# Patient Record
Sex: Male | Born: 1945
Health system: Southern US, Community
[De-identification: ages and names within clinical notes are randomized; demographics above are authoritative.]

## PROBLEM LIST (undated history)

## (undated) DIAGNOSIS — E039 Hypothyroidism, unspecified: Secondary | ICD-10-CM

## (undated) DIAGNOSIS — I517 Cardiomegaly: Secondary | ICD-10-CM

## (undated) DIAGNOSIS — K7460 Unspecified cirrhosis of liver: Secondary | ICD-10-CM

## (undated) DIAGNOSIS — I861 Scrotal varices: Secondary | ICD-10-CM

## (undated) DIAGNOSIS — D696 Thrombocytopenia, unspecified: Secondary | ICD-10-CM

## (undated) DIAGNOSIS — B159 Hepatitis A without hepatic coma: Secondary | ICD-10-CM

## (undated) DIAGNOSIS — M1990 Unspecified osteoarthritis, unspecified site: Secondary | ICD-10-CM

## (undated) DIAGNOSIS — I219 Acute myocardial infarction, unspecified: Secondary | ICD-10-CM

## (undated) DIAGNOSIS — T7840XA Allergy, unspecified, initial encounter: Secondary | ICD-10-CM

## (undated) DIAGNOSIS — K824 Cholesterolosis of gallbladder: Secondary | ICD-10-CM

## (undated) DIAGNOSIS — N529 Male erectile dysfunction, unspecified: Secondary | ICD-10-CM

## (undated) DIAGNOSIS — E785 Hyperlipidemia, unspecified: Secondary | ICD-10-CM

## (undated) DIAGNOSIS — H269 Unspecified cataract: Secondary | ICD-10-CM

## (undated) DIAGNOSIS — D369 Benign neoplasm, unspecified site: Secondary | ICD-10-CM

## (undated) HISTORY — DX: Unspecified cirrhosis of liver: K74.60

## (undated) HISTORY — DX: Unspecified osteoarthritis, unspecified site: M19.90

## (undated) HISTORY — PX: POLYPECTOMY: SHX149

## (undated) HISTORY — DX: Unspecified cataract: H26.9

## (undated) HISTORY — PX: LIVER BIOPSY: SHX301

## (undated) HISTORY — DX: Hyperlipidemia, unspecified: E78.5

## (undated) HISTORY — DX: Allergy, unspecified, initial encounter: T78.40XA

## (undated) HISTORY — DX: Acute myocardial infarction, unspecified: I21.9

## (undated) HISTORY — DX: Benign neoplasm, unspecified site: D36.9

## (undated) HISTORY — DX: Hepatitis a without hepatic coma: B15.9

## (undated) HISTORY — DX: Male erectile dysfunction, unspecified: N52.9

## (undated) HISTORY — PX: TONSILLECTOMY: SUR1361

## (undated) HISTORY — DX: Hypothyroidism, unspecified: E03.9

## (undated) HISTORY — DX: Scrotal varices: I86.1

---
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
## 2011-02-01 DIAGNOSIS — I861 Scrotal varices: Secondary | ICD-10-CM

## 2011-02-01 HISTORY — DX: Scrotal varices: I86.1

## 2011-02-05 DIAGNOSIS — E039 Hypothyroidism, unspecified: Secondary | ICD-10-CM | POA: Insufficient documentation

## 2011-02-05 DIAGNOSIS — D126 Benign neoplasm of colon, unspecified: Secondary | ICD-10-CM | POA: Insufficient documentation

## 2011-02-05 DIAGNOSIS — E782 Mixed hyperlipidemia: Secondary | ICD-10-CM | POA: Insufficient documentation

## 2012-02-09 DIAGNOSIS — N529 Male erectile dysfunction, unspecified: Secondary | ICD-10-CM | POA: Insufficient documentation

## 2012-08-09 DIAGNOSIS — D72819 Decreased white blood cell count, unspecified: Secondary | ICD-10-CM | POA: Insufficient documentation

## 2012-12-06 HISTORY — PX: COLONOSCOPY: SHX174

## 2016-12-30 LAB — HM HEPATITIS C SCREENING LAB: HM HEPATITIS C SCREENING: NEGATIVE

## 2017-01-07 DIAGNOSIS — M19042 Primary osteoarthritis, left hand: Secondary | ICD-10-CM | POA: Insufficient documentation

## 2017-01-07 DIAGNOSIS — M65342 Trigger finger, left ring finger: Secondary | ICD-10-CM | POA: Insufficient documentation

## 2017-11-08 ENCOUNTER — Encounter: Payer: Self-pay | Admitting: *Deleted

## 2017-11-08 ENCOUNTER — Other Ambulatory Visit: Payer: Self-pay | Admitting: *Deleted

## 2017-11-08 ENCOUNTER — Encounter: Payer: Self-pay | Admitting: Family Medicine

## 2017-11-08 ENCOUNTER — Ambulatory Visit (INDEPENDENT_AMBULATORY_CARE_PROVIDER_SITE_OTHER): Payer: Medicare Other | Admitting: Family Medicine

## 2017-11-08 VITALS — BP 120/84 | HR 62 | Temp 97.9°F | Ht 73.0 in | Wt 255.2 lb

## 2017-11-08 DIAGNOSIS — Z79899 Other long term (current) drug therapy: Secondary | ICD-10-CM

## 2017-11-08 DIAGNOSIS — N529 Male erectile dysfunction, unspecified: Secondary | ICD-10-CM

## 2017-11-08 DIAGNOSIS — D126 Benign neoplasm of colon, unspecified: Secondary | ICD-10-CM | POA: Diagnosis not present

## 2017-11-08 DIAGNOSIS — E782 Mixed hyperlipidemia: Secondary | ICD-10-CM | POA: Diagnosis not present

## 2017-11-08 DIAGNOSIS — E039 Hypothyroidism, unspecified: Secondary | ICD-10-CM | POA: Diagnosis not present

## 2017-11-08 LAB — COMPREHENSIVE METABOLIC PANEL
ALK PHOS: 68 U/L (ref 39–117)
ALT: 27 U/L (ref 0–53)
AST: 24 U/L (ref 0–37)
Albumin: 4.4 g/dL (ref 3.5–5.2)
BUN: 11 mg/dL (ref 6–23)
CO2: 28 meq/L (ref 19–32)
CREATININE: 0.88 mg/dL (ref 0.40–1.50)
Calcium: 9.8 mg/dL (ref 8.4–10.5)
Chloride: 103 mEq/L (ref 96–112)
GFR: 90.41 mL/min (ref >60.00)
GLUCOSE: 107 mg/dL — AB (ref 70–99)
Potassium: 4.3 mEq/L (ref 3.5–5.1)
SODIUM: 136 meq/L (ref 135–145)
Total Bilirubin: 0.7 mg/dL (ref 0.2–1.2)
Total Protein: 6.9 g/dL (ref 6.0–8.3)

## 2017-11-08 LAB — LIPID PANEL
CHOLESTEROL: 171 mg/dL (ref 0–200)
HDL: 44.1 mg/dL (ref >39.00)
LDL Cholesterol: 102 mg/dL — ABNORMAL HIGH (ref 0–99)
NonHDL: 126.95
Total CHOL/HDL Ratio: 4
Triglycerides: 124 mg/dL (ref 0.0–149.0)
VLDL: 24.8 mg/dL (ref 0.0–40.0)

## 2017-11-08 LAB — CBC WITH DIFFERENTIAL/PLATELET
BASOS ABS: 0 10*3/uL (ref 0.0–0.1)
Basophils Relative: 0.9 % (ref 0.0–3.0)
EOS ABS: 0.1 10*3/uL (ref 0.0–0.7)
Eosinophils Relative: 3.1 % (ref 0.0–5.0)
HCT: 40.1 % (ref 39.0–52.0)
Hemoglobin: 13.8 g/dL (ref 13.0–17.0)
LYMPHS ABS: 1 10*3/uL (ref 0.7–4.0)
Lymphocytes Relative: 28.1 % (ref 12.0–46.0)
MCHC: 34.3 g/dL (ref 30.0–36.0)
MCV: 97.3 fl (ref 78.0–100.0)
Monocytes Absolute: 0.4 10*3/uL (ref 0.1–1.0)
Monocytes Relative: 11.3 % (ref 3.0–12.0)
NEUTROS ABS: 2.1 10*3/uL (ref 1.4–7.7)
NEUTROS PCT: 56.6 % (ref 43.0–77.0)
PLATELETS: 152 10*3/uL (ref 150.0–400.0)
RBC: 4.13 Mil/uL — ABNORMAL LOW (ref 4.22–5.81)
RDW: 13.8 % (ref 11.5–15.5)
WBC: 3.7 10*3/uL — ABNORMAL LOW (ref 4.0–10.5)

## 2017-11-08 LAB — TSH: TSH: 0.57 u[IU]/mL (ref 0.35–4.50)

## 2017-11-08 MED ORDER — SILDENAFIL CITRATE 20 MG PO TABS: ORAL_TABLET | ORAL | 3 refills | Status: DC

## 2017-11-08 NOTE — Progress Notes (Signed)
Subjective  CC:  Chief Complaint  Patient presents with  . Follow-up  . Hypothyroidism    HPI: Timothy Grant is a 71 y.o. male who presents to [place] [facility] today to establish care with me as a new patient. Former [place] patient; last cpe and awv jan 2018 He has the following concerns or needs:   Hypothyroidism f/u: Last TSH was at goal.  He continues on his daily thyroid supplementation and feels well.  He denies symptoms of low or high thyroid.  He does not need refills.  He is compliant with his medication all the time.  History of adenomatous polyp of the colon, last colonoscopy 2015 with Dr. Collene Mares.  He believes she put him on a every 5 year surveillance schedule.  He denies melena abdominal pain or weight changes.  Erectile disorder: He has been on sildenafil for the last year or 2 and has been working very well.  He requests a refill.  He denies medication side effects.  He is in a happy stable monogamous marriage.  Hyperlipidemia follow-up: Continues on statin.  Lipids have been well controlled.  Due for LFT and lipid check.  No myalgias or adverse effects.  His diet is fair.  Exercise has been decreased over the last several months due to the colder weather.  He has gained a little bit of weight as well.  He is not routinely doing exercise.  They do eat out often.  We updated and reviewed the patient's past history in detail and it is documented below.  Problem  Acquired Hypothyroidism  Adenomatous Colon Polyp   Overview:  Adenomatous polyps, q 3-5 year screens, last 2015, Dr. Collene Mares   Mixed Hyperlipidemia  Osteoarthritis of Finger of Left Hand  Leukopenia   Overview:  mild   Trigger Finger, Left Ring Finger  Male Erectile Disorder   Health Maintenance  Topic Date Due  . Hepatitis C Screening  1946/08/10  . COLONOSCOPY  02/23/2019  . TETANUS/TDAP  02/04/2021  . INFLUENZA VACCINE  Completed  . PNA vac Low Risk Adult  Completed    Immunization History  Administered Date(s) Administered  . Hepatitis B, ped/adol 08/06/2009  . Influenza Split 08/07/2011, 08/28/2013  . Influenza, High Dose Seasonal PF 09/26/2017  . Influenza, Quadrivalent, Recombinant, Inj, Pf 09/06/2016  . Influenza, Seasonal, Injecte, Preservative Fre 09/04/2014, 09/09/2015  . Influenza,trivalent, recombinat, inj, PF 08/09/2012  . Pneumococcal Conjugate-13 11/04/2014  . Pneumococcal Polysaccharide-23 02/05/2011  . Tdap 02/05/2011  . Zoster 02/05/2011   Current Meds  Medication Sig  . aspirin EC 81 MG tablet Take 162 mg by mouth daily.  . Cholecalciferol (VITAMIN D3) 5000 units TABS Take 1 tablet by mouth daily.   . cyclobenzaprine (FLEXERIL) 10 MG tablet Take 10 mg by mouth as needed.   . Glucosamine HCl 1000 MG TABS Take 2 tablets by mouth daily.  Marland Kitchen levothyroxine (SYNTHROID, LEVOTHROID) 150 MCG tablet   . Multiple Vitamin (MULTIVITAMIN) capsule Take 1 capsule by mouth daily.   . naproxen sodium (ALEVE) 220 MG tablet Take 220 mg by mouth as needed.  Marland Kitchen omega-3 acid ethyl esters (LOVAZA) 1 g capsule Take 1 g by mouth daily.   . rosuvastatin (CRESTOR) 20 MG tablet Take 20 mg by mouth daily.   . sildenafil (REVATIO) 20 MG tablet Use 5 tablets PO as needed  . [DISCONTINUED] Glucosamine Sulfate 500 MG CAPS Take by mouth.  . [DISCONTINUED] sildenafil (REVATIO) 20 MG tablet Use 5 tablets PO as needed  Allergies: Patient  reports that he drinks alcohol. Past Medical History Patient  has a past medical history of Adenomatous polyps, ED (erectile dysfunction), Hyperlipidemia, Hypothyroidism, and Varicocele (02/01/2011). Past Surgical History Patient  has a past surgical history that includes Tonsillectomy; Eye surgery (Right, 1956); and Eye surgery (Left, 1963). Family History: Patient family history includes Alzheimer's disease in his mother; Arthritis in his mother; Dementia in his mother; Heart disease in his father and paternal grandmother;  Hyperlipidemia in his brother; Stroke in his mother. Social History:  Patient  reports that  has never smoked. he has never used smokeless tobacco. He reports that he drinks alcohol. He reports that he does not use drugs.  Review of Systems: Constitutional: negative for fever or malaise Cardiovascular: negative for chest pain Respiratory: negative for SOB or persistent cough Gastrointestinal: negative for abdominal pain Genitourinary: negative for dysuria or gross hematuria Musculoskeletal: negative for new gait disturbance or muscular weakness Integumentary: negative for new or persistent rashes  Patient Care Team    Relationship Specialty Notifications Start End  [name], MD PCP - General Family Medicine  11/07/17     Objective  Vitals: BP 120/84 (BP Location: Left Arm, Patient Position: Sitting, Cuff Size: Large)   Pulse 62   Temp 97.9 F (36.6 C) (Oral)   Ht 6\' 1"  (1.854 m)   Wt 255 lb 4 oz (115.8 kg)   SpO2 94%   BMI 33.68 kg/m  General:  Well developed, well nourished, no acute distress  Psych:  Alert and oriented,normal mood and affect HEENT:  Normocephalic, atraumatic, supple neck , no thyromegaly, oropharynx clear and moist Cardiovascular:  RRR without murmur, no gallop, no peripheral edema Respiratory:  Good breath sounds bilaterally, CTAB with normal respiratory effort Gastrointestinal: normal bowel sounds, soft, non-tender, no noted masses. No HSM Skin:  Warm, no rashes Neurologic:   Mental status is normal. Gross motor and sensory exams are normal. Normal gait, no tremor  Assessment  1. Acquired hypothyroidism   2. Mixed hyperlipidemia   3. Adenomatous polyp of colon, unspecified part of colon   4. Male erectile disorder   5. High risk medication use      Plan   Hypothyroidism follow-up: Stable clinically.  Recheck TSH.  Continue Synthroid 150 mcg daily, adjust if TSH is abnormal.  Recheck in 1 year if stable, 6 weeks if needs medication  adjustment.  Hyperlipidemia follow-up: Continue statin and recheck lab work today.  Adenomatous colon polyp: Expect surveillance every 5 years.  ED: Refilled ED medication.  Health maintenance is up-to-date.  Discussed improving diet and exercise program.  Discussed strength training which will be helpful to maintain high level of function as he ages.  See AVS for keeping healthy recommendations.  Follow up: Follow-up in 1 year for annual physical, annual wellness visit at his convenience.   Commons side effects, risks, benefits, and alternatives for medications and treatment plan prescribed today were discussed, and the patient expressed understanding of the given instructions. Patient is instructed to call or message via MyChart if he/she has any questions or concerns regarding our treatment plan. No barriers to understanding were identified. We discussed Red Flag symptoms and signs in detail. Patient expressed understanding regarding what to do in case of urgent or emergency type symptoms.   Medication list was reconciled, printed and provided to the patient in AVS. Patient instructions and summary information was reviewed with the patient as documented in the AVS. This note was prepared with assistance of Dragon  voice recognition software. Occasional wrong-word or sound-a-like substitutions may have occurred due to the inherent limitations of voice recognition software  Orders Placed This Encounter  Procedures  . CBC with Differential/Platelet  . Comprehensive metabolic panel  . TSH  . Lipid panel   Meds ordered this encounter  Medications  . sildenafil (REVATIO) 20 MG tablet    Sig: Use 5 tablets PO as needed    Dispense:  100 tablet    Refill:  3

## 2017-11-08 NOTE — Patient Instructions (Signed)
It was so good seeing you again! Thank you for establishing with my new practice and allowing me to continue caring for you. It means a lot to me.   Please schedule a follow up appointment with me in 1 year for your complete physical. You can schedule an AWV at your convenience.   Please do these things to maintain good health!   Exercise at least 30-45 minutes a day,  4-5 days a week.   Eat a low-fat diet with lots of fruits and vegetables, up to 7-9 servings per day.  Drink plenty of water daily. Try to drink 8 8oz glasses per day.  Seatbelts can save your life. Always wear your seatbelt.  Place Smoke Detectors on every level of your home and check batteries every year.  Eye Doctor - have an eye exam every 1-2 years  Safe sex - use condoms to protect yourself from STDs if you could be exposed to these types of infections.  Avoid heavy alcohol use. If you drink, keep it to less than 2 drinks/day and not every day.  [place].  Choose someone you trust that could speak for you if you became unable to speak for yourself.  Depression is common in our stressful world.If you're feeling down or losing interest in things you normally enjoy, please come in for a visit.

## 2018-01-25 ENCOUNTER — Other Ambulatory Visit: Payer: Self-pay | Admitting: Family Medicine

## 2018-01-25 MED ORDER — ROSUVASTATIN CALCIUM 20 MG PO TABS: 20.0000 mg | ORAL_TABLET | Freq: Every day | ORAL | 1 refills | Status: DC

## 2018-01-25 NOTE — Telephone Encounter (Signed)
Rx refill request- for provider review:  Crestor 20 mg- historical medication  LOV: 11/08/17- establish care  Pharmacy: verified

## 2018-01-25 NOTE — Telephone Encounter (Signed)
Patient had his establish care visit and his CPE is scheduled. Medication filled.

## 2018-01-25 NOTE — Telephone Encounter (Signed)
Copied from [place] [id number]. Topic: Quick Communication - Rx Refill/Question >> Jan 25, 2018 11:10 AM Antonieta Iba C wrote:   Medication:  rosuvastatin (CRESTOR) 20 MG tablet   Has the patient contacted their pharmacy? Yes -    (Agent: If no, request that the patient contact the pharmacy for the refill.)   Preferred Pharmacy (with phone number or street name): [facility], [place]   Agent: Please be advised that RX refills may take up to 3 business days. We ask that you follow-up with your pharmacy.

## 2018-03-21 NOTE — Progress Notes (Signed)
Subjective:   Timothy Grant is a 72 y.o. male who presents for Medicare Annual/Subsequent preventive examination.  Review of Systems:  No ROS.  Medicare Wellness Visit. Additional risk factors are reflected in the social history.  Cardiac Risk Factors include: advanced age (>69men, >69 women);dyslipidemia;male gender;family history of premature cardiovascular disease;obesity (BMI >30kg/m2)   Sleep patterns: Sleeps 6-8 hours.  Home Safety/Smoke Alarms: Feels safe in home. Smoke alarms in place.  Living environment; residence and Firearm Safety: Lives with wife in single story home. Steps with rail at door.  Seat Belt Safety/Bike Helmet: Wears seat belt.    Male:   CCS-Colonoscopy 2015, polyps. Recall 5 years, Dr. Collene Mares.  PSA- No results found for: PSA      Objective:    Vitals: BP 130/82 (BP Location: Left Arm, Patient Position: Sitting, Cuff Size: Normal)   Pulse (!) 56   Temp 97.8 F (36.6 C) (Temporal)   Resp 18   Ht 6\' 1"  (1.854 m)   Wt 254 lb (115.2 kg)   SpO2 96%   BMI 33.51 kg/m   Body mass index is 33.51 kg/m.  Advanced Directives 03/22/2018  Does Patient Have a Medical Advance Directive? Yes  Type of Advance Directive Living will;Healthcare Power of [place] in Chart? No - copy requested    Tobacco Social History   Tobacco Use  Smoking Status Never Smoker  Smokeless Tobacco Never Used     Counseling given: Not Answered    Past Medical History:  Diagnosis Date  . Adenomatous polyps   . ED (erectile dysfunction)   . Hyperlipidemia   . Hypothyroidism   . Varicocele 02/01/2011   Left testicle   Past Surgical History:  Procedure Laterality Date  . EYE SURGERY Right 1956  . EYE SURGERY Left 1963  . TONSILLECTOMY     Family History  Problem Relation Age of Onset  . Alzheimer's disease Mother   . Arthritis Mother   . Dementia Mother   . Stroke Mother   . Heart disease Father   . Cancer Sister    uterine  . Hyperlipidemia Brother   . Stroke Brother   . Cancer Brother   . Heart disease Paternal Grandmother    Social History   Socioeconomic History  . Marital status: Married    Spouse name: Not on file  . Number of children: Not on file  . Years of education: Not on file  . Highest education level: Not on file  Occupational History  . Not on file  Social Needs  . Financial resource strain: Not on file  . Food insecurity:    Worry: Not on file    Inability: Not on file  . Transportation needs:    Medical: Not on file    Non-medical: Not on file  Tobacco Use  . Smoking status: Never Smoker  . Smokeless tobacco: Never Used  Substance and Sexual Activity  . Alcohol use: Yes    Comment: socially  . Drug use: No  . Sexual activity: Yes  Lifestyle  . Physical activity:    Days per week: Not on file    Minutes per session: Not on file  . Stress: Not on file  Relationships  . Social connections:    Talks on phone: Not on file    Gets together: Not on file    Attends religious service: Not on file    Active member of club or organization: Not on file  Attends meetings of clubs or organizations: Not on file    Relationship status: Not on file  Other Topics Concern  . Not on file  Social History Narrative  . Not on file    Outpatient Encounter Medications as of 03/22/2018  Medication Sig  . aspirin EC 81 MG tablet Take 162 mg by mouth daily.  . Cholecalciferol (VITAMIN D3) 5000 units TABS Take 1 tablet by mouth daily.   . cyclobenzaprine (FLEXERIL) 10 MG tablet Take 10 mg by mouth as needed.   . Glucosamine HCl 1000 MG TABS Take 2 tablets by mouth daily.  Marland Kitchen levothyroxine (SYNTHROID, LEVOTHROID) 150 MCG tablet   . Multiple Vitamin (MULTIVITAMIN) capsule Take 1 capsule by mouth daily.   . naproxen sodium (ALEVE) 220 MG tablet Take 220 mg by mouth as needed.  Marland Kitchen omega-3 acid ethyl esters (LOVAZA) 1 g capsule Take 1 g by mouth daily.   . rosuvastatin (CRESTOR) 20 MG  tablet Take 1 tablet (20 mg total) by mouth daily.  . sildenafil (REVATIO) 20 MG tablet Use 5 tablets PO as needed  . Zoster Vaccine Adjuvanted [facility]) injection Inject 0.5 mLs into the muscle once for 1 dose.   No facility-administered encounter medications on file as of 03/22/2018.     Activities of Daily Living In your present state of health, do you have any difficulty performing the following activities: 03/22/2018  Hearing? N  Vision? N  Difficulty concentrating or making decisions? N  Walking or climbing stairs? N  Dressing or bathing? N  Doing errands, shopping? N  Preparing Food and eating ? N  Using the Toilet? N  In the past six months, have you accidently leaked urine? N  Do you have problems with loss of bowel control? N  Managing your Medications? N  Managing your Finances? N  Housekeeping or managing your Housekeeping? N  Some recent data might be hidden    Patient Care Team: [name], MD as PCP - General (Family Medicine) Stephannie Li, [place] (Ophthalmology) Daryll Brod, MD as Consulting Physician (Orthopedic Surgery) [place] (Dentistry)   Assessment:   This is a routine wellness examination for Timothy Grant.  Exercise Activities and Dietary recommendations Current Exercise Habits: Home exercise routine(mows yard), Type of exercise: walking(walks dogs), Time (Minutes): 40, Frequency (Times/Week): 7, Weekly Exercise (Minutes/Week): 280, Exercise limited by: None identified   Diet (meal preparation, eat out, water intake, caffeinated beverages, dairy products, fruits and vegetables): Drinks water, unsweet tea and diet soda.   Breakfast: coffee, toast; cereal Lunch: sandwich, salad Dinner: protein and vegetables.  Snacks: fruit  Goals    . Weight (lb) < 240 lb (108.9 kg)     Lose 15-20 lbs by increasing activity and decrease caloric intake.        Fall Risk Fall Risk  03/22/2018 11/08/2017  Falls in the past year? No No     Depression Screen PHQ  2/9 Scores 03/22/2018 11/08/2017  PHQ - 2 Score 0 0    Cognitive Function MMSE - Mini Mental State Exam 03/22/2018  Orientation to time 5  Orientation to Place 5  Registration 3  Attention/ Calculation 5  Recall 3  Language- name 2 objects 2  Language- repeat 1  Language- follow 3 step command 3  Language- read & follow direction 1  Write a sentence 1  Copy design 1  Total score 30        Immunization History  Administered Date(s) Administered  . Hepatitis B, ped/adol 08/06/2009  .  Influenza Split 08/07/2011, 08/28/2013  . Influenza, High Dose Seasonal PF 09/26/2017  . Influenza, Quadrivalent, Recombinant, Inj, Pf 09/06/2016  . Influenza, Seasonal, Injecte, Preservative Fre 09/04/2014, 09/09/2015  . Influenza,trivalent, recombinat, inj, PF 08/09/2012  . Pneumococcal Conjugate-13 11/04/2014  . Pneumococcal Polysaccharide-23 02/05/2011  . Tdap 02/05/2011  . Zoster 02/05/2011     Screening Tests Health Maintenance  Topic Date Due  . Hepatitis C Screening  November 26, 1946  . INFLUENZA VACCINE  07/06/2018  . COLONOSCOPY  02/23/2019  . TETANUS/TDAP  02/04/2021  . PNA vac Low Risk Adult  Completed        Plan:    Shingles vaccine at pharmacy.   Continue doing brain stimulating activities (puzzles, reading, adult coloring books, staying active) to keep memory sharp.   Bring a copy of your living will and/or healthcare power of attorney to your next office visit.   I have personally reviewed and noted the following in the patient's chart:   . Medical and social history . Use of alcohol, tobacco or illicit drugs  . Current medications and supplements . Functional ability and status . Nutritional status . Physical activity . Advanced directives . List of other physicians . Hospitalizations, surgeries, and ER visits in previous 12 months . Vitals . Screenings to include cognitive, depression, and falls . Referrals and appointments  In addition, I have reviewed and  discussed with patient certain preventive protocols, quality metrics, and best practice recommendations. A written personalized care plan for preventive services as well as general preventive health recommendations were provided to patient.     Gerilyn Nestle, RN  03/22/2018

## 2018-03-22 ENCOUNTER — Other Ambulatory Visit: Payer: Self-pay

## 2018-03-22 ENCOUNTER — Ambulatory Visit (INDEPENDENT_AMBULATORY_CARE_PROVIDER_SITE_OTHER): Payer: Medicare Other | Admitting: Family Medicine

## 2018-03-22 ENCOUNTER — Ambulatory Visit (INDEPENDENT_AMBULATORY_CARE_PROVIDER_SITE_OTHER): Payer: Medicare Other

## 2018-03-22 ENCOUNTER — Encounter: Payer: Self-pay | Admitting: Family Medicine

## 2018-03-22 VITALS — BP 130/82 | HR 56 | Temp 97.8°F | Resp 18 | Ht 73.0 in | Wt 254.0 lb

## 2018-03-22 DIAGNOSIS — Z0000 Encounter for general adult medical examination without abnormal findings: Secondary | ICD-10-CM | POA: Diagnosis not present

## 2018-03-22 DIAGNOSIS — D72819 Decreased white blood cell count, unspecified: Secondary | ICD-10-CM

## 2018-03-22 DIAGNOSIS — Z23 Encounter for immunization: Secondary | ICD-10-CM | POA: Diagnosis not present

## 2018-03-22 DIAGNOSIS — E782 Mixed hyperlipidemia: Secondary | ICD-10-CM

## 2018-03-22 DIAGNOSIS — E039 Hypothyroidism, unspecified: Secondary | ICD-10-CM | POA: Diagnosis not present

## 2018-03-22 DIAGNOSIS — Z1159 Encounter for screening for other viral diseases: Secondary | ICD-10-CM | POA: Diagnosis not present

## 2018-03-22 LAB — COMPREHENSIVE METABOLIC PANEL
ALK PHOS: 69 U/L (ref 39–117)
ALT: 23 U/L (ref 0–53)
AST: 23 U/L (ref 0–37)
Albumin: 4.2 g/dL (ref 3.5–5.2)
BUN: 16 mg/dL (ref 6–23)
CHLORIDE: 100 meq/L (ref 96–112)
CO2: 27 meq/L (ref 19–32)
Calcium: 9.6 mg/dL (ref 8.4–10.5)
Creatinine, Ser: 0.97 mg/dL (ref 0.40–1.50)
GFR: 80.71 mL/min (ref >60.00)
GLUCOSE: 94 mg/dL (ref 70–99)
POTASSIUM: 4.3 meq/L (ref 3.5–5.1)
SODIUM: 135 meq/L (ref 135–145)
Total Bilirubin: 0.6 mg/dL (ref 0.2–1.2)
Total Protein: 6.8 g/dL (ref 6.0–8.3)

## 2018-03-22 LAB — LIPID PANEL
CHOL/HDL RATIO: 3
Cholesterol: 154 mg/dL (ref 0–200)
HDL: 50.1 mg/dL (ref >39.00)
LDL Cholesterol: 88 mg/dL (ref 0–99)
NonHDL: 103.57
Triglycerides: 76 mg/dL (ref 0.0–149.0)
VLDL: 15.2 mg/dL (ref 0.0–40.0)

## 2018-03-22 LAB — TSH: TSH: 0.65 u[IU]/mL (ref 0.35–4.50)

## 2018-03-22 MED ORDER — ZOSTER VAC RECOMB ADJUVANTED 50 MCG/0.5ML IM SUSR
0.5000 mL | Freq: Once | INTRAMUSCULAR | 1 refills | Status: AC
Start: 2018-03-22 — End: 2018-03-22

## 2018-03-22 NOTE — Patient Instructions (Addendum)
Please return in April 2020 for your annual complete physical and your Annual Wellness Exam please come fasting.   If you have any questions or concerns, please don't hesitate to send me a message via MyChart or call the office at [phone]. Thank you for visiting with [place] today! It's our pleasure caring for you.  Please do these things to maintain good health!   Exercise at least 30-45 minutes a day,  4-5 days a week.   Eat a low-fat diet with lots of fruits and vegetables, up to 7-9 servings per day.  Drink plenty of water daily. Try to drink 8 8oz glasses per day.  Seatbelts can save your life. Always wear your seatbelt.  Place Smoke Detectors on every level of your home and check batteries every year.  Eye Doctor - have an eye exam every 1-2 years  Safe sex - use condoms to protect yourself from STDs if you could be exposed to these types of infections.  Avoid heavy alcohol use. If you drink, keep it to less than 2 drinks/day and not every day.  [place].  Choose someone you trust that could speak for you if you became unable to speak for yourself.  Depression is common in our stressful world.If you're feeling down or losing interest in things you normally enjoy, please come in for a visit.

## 2018-03-22 NOTE — Progress Notes (Signed)
Subjective  Chief Complaint  Patient presents with  . Annual Exam    AWE with Maudie Mercury.     HPI: Timothy Grant is a 72 y.o. male who presents to [place] at [facility] today for a Male Wellness Visit. Just completed AWV; I have reviewed findings. Wellness Visit: annual visit with health maintenance review and exam    Doing GREAT! No concerns. Reviewed recent labs from December - thyroid and lipids at goal on meds. No AEs. Energy is good. Mood is good. ROS negative as listed below.  HM  - due for shingrix. RX given today  Lifestyle: Body mass index is 33.51 kg/m. Wt Readings from Last 3 Encounters:  03/22/18 254 lb (115.2 kg)  03/22/18 254 lb (115.2 kg)  11/08/17 255 lb 4 oz (115.8 kg)   Diet: general Exercise: intermittently, walking  Chronic disease management visit and/or acute problem visit:  hypothryoidism and hyperlipidemia stable. ED on viagra.   Patient Active Problem List   Diagnosis Date Noted  . Acquired hypothyroidism 02/05/2011    Priority: High  . Adenomatous colon polyp 02/05/2011    Priority: High  . Mixed hyperlipidemia 02/05/2011    Priority: High  . Osteoarthritis of finger of left hand 01/07/2017    Priority: Medium  . Leukopenia 08/09/2012    Priority: Medium  . Trigger finger, left ring finger 01/07/2017    Priority: Low  . Male erectile disorder 02/09/2012    Priority: Low   Health Maintenance  Topic Date Due  . Hepatitis C Screening  13-Sep-1946  . INFLUENZA VACCINE  07/06/2018  . COLONOSCOPY  02/23/2019  . TETANUS/TDAP  02/04/2021  . PNA vac Low Risk Adult  Completed   Immunization History  Administered Date(s) Administered  . Hepatitis B, ped/adol 08/06/2009  . Influenza Split 08/07/2011, 08/28/2013  . Influenza, High Dose Seasonal PF 09/26/2017  . Influenza, Quadrivalent, Recombinant, Inj, Pf 09/06/2016  . Influenza, Seasonal, Injecte, Preservative Fre 09/04/2014, 09/09/2015  . Influenza,trivalent, recombinat,  inj, PF 08/09/2012  . Pneumococcal Conjugate-13 11/04/2014  . Pneumococcal Polysaccharide-23 02/05/2011  . Tdap 02/05/2011  . Zoster 02/05/2011   We updated and reviewed the patient's past history in detail and it is documented below. Allergies: Patient has No Known Allergies. Past Medical History  has a past medical history of Adenomatous polyps, ED (erectile dysfunction), Hyperlipidemia, Hypothyroidism, and Varicocele (02/01/2011). Past Surgical History Patient  has a past surgical history that includes Tonsillectomy; Eye surgery (Right, 1956); and Eye surgery (Left, 1963). Social History Patient  reports that he has never smoked. He has never used smokeless tobacco. He reports that he drinks alcohol. He reports that he does not use drugs. Family History family history includes Alzheimer's disease in his mother; Arthritis in his mother; Cancer in his brother and sister; Dementia in his mother; Heart disease in his father and paternal grandmother; Hyperlipidemia in his brother; Stroke in his brother and mother. Review of Systems: Constitutional: negative for fever or malaise Ophthalmic: negative for photophobia, double vision or loss of vision Cardiovascular: negative for chest pain, dyspnea on exertion, or new LE swelling Respiratory: negative for SOB or persistent cough Gastrointestinal: negative for abdominal pain, change in bowel habits or melena Genitourinary: negative for dysuria or gross hematuria Musculoskeletal: negative for new gait disturbance or muscular weakness Integumentary: negative for new or persistent rashes Neurological: negative for TIA or stroke symptoms Psychiatric: negative for SI or delusions Allergic/Immunologic: negative for hives  Patient Care Team    Relationship Specialty Notifications Start  End  [name], MD PCP - General Family Medicine  11/07/17   Stephannie Li, [place]  Ophthalmology  03/22/18   Daryll Brod, MD Consulting Physician Orthopedic  Surgery  03/22/18   [place]  Dentistry  03/22/18   Juanita Craver, MD Consulting Physician Gastroenterology  03/22/18    Objective  Vitals: BP 130/82   Pulse (!) 56   Temp 97.8 F (36.6 C)   Resp 18   Ht 6\' 1"  (1.854 m)   Wt 254 lb (115.2 kg)   SpO2 96%   BMI 33.51 kg/m  General:  Well developed, well nourished, no acute distress  Psych:  Alert and orientedx3,normal mood and affect HEENT:  Normocephalic, atraumatic, non-icteric sclera, PERRL, oropharynx is clear without mass or exudate, supple neck without adenopathy, mass or thyromegaly Cardiovascular:  Normal S1, S2, RRR without gallop, rub or murmur, nondisplaced PMI, +2 distal pulses in bilateral upper and lower extremities. Respiratory:  Good breath sounds bilaterally, CTAB with normal respiratory effort Gastrointestinal: normal bowel sounds, soft, non-tender, no noted masses. No HSM MSK: no deformities, contusions. Joints are without erythema or swelling. Spine and CVA region are nontender Skin:  Warm, no suspicious lesions noted, hypopigmented lesions torso - Possible tinea versicolor Neurologic:    Mental status is normal. CN 2-11 are normal. Gross motor and sensory exams are normal. Stable gait. No tremor GU: No inguinal hernias or adenopathy are appreciated bilaterally   Assessment  1. Annual physical exam   2. Acquired hypothyroidism   3. Mixed hyperlipidemia   4. Leukopenia, unspecified type      Plan  Male Wellness Visit:  Age appropriate Health Maintenance and Prevention measures were discussed with patient. Included topics are cancer screening recommendations, ways to keep healthy (see AVS) including dietary and exercise recommendations, regular eye and dental care, use of seat belts, and avoidance of moderate alcohol use and tobacco use.   BMI: discussed patient's BMI and encouraged positive lifestyle modifications to help get to or maintain a target BMI.  HM needs and immunizations were addressed and ordered. See  below for orders. See HM and immunization section for updates.shingrix RX  Routine labs and screening tests ordered including cmp, cbc and lipids where appropriate.  Discussed recommendations regarding Vit D and calcium supplementation (see AVS)  Chronic disease f/u and/or acute problem visit: (deemed necessary to be done in addition to the wellness visit):  Recheck lipids and tsh for stability, then annually. No med changes.   Follow up: Return in about 1 year (around 03/23/2019) for complete physical, AWV .   Commons side effects, risks, benefits, and alternatives for medications and treatment plan prescribed today were discussed, and the patient expressed understanding of the given instructions. Patient is instructed to call or message via MyChart if he/she has any questions or concerns regarding our treatment plan. No barriers to understanding were identified. We discussed Red Flag symptoms and signs in detail. Patient expressed understanding regarding what to do in case of urgent or emergency type symptoms.   Medication list was reconciled, printed and provided to the patient in AVS. Patient instructions and summary information was reviewed with the patient as documented in the AVS. This note was prepared with assistance of Dragon voice recognition software. Occasional wrong-word or sound-a-like substitutions may have occurred due to the inherent limitations of voice recognition software  Orders Placed This Encounter  Procedures  . HM HEPATITIS C SCREENING LAB  . Comprehensive metabolic panel  . TSH  . Lipid panel  No orders of the defined types were placed in this encounter.

## 2018-03-22 NOTE — Patient Instructions (Addendum)
Shingles vaccine at pharmacy  Continue doing brain stimulating activities (puzzles, reading, adult coloring books, staying active) to keep memory sharp.   Bring a copy of your living will and/or healthcare power of attorney to your next office visit.   Health Maintenance, Male A healthy lifestyle and preventive care is important for your health and wellness. Ask your health care provider about what schedule of regular examinations is right for you. What should I know about weight and diet? Eat a Healthy Diet  Eat plenty of vegetables, fruits, whole grains, low-fat dairy products, and lean protein.  Do not eat a lot of foods high in solid fats, added sugars, or salt.  Maintain a Healthy Weight Regular exercise can help you achieve or maintain a healthy weight. You should:  Do at least 150 minutes of exercise each week. The exercise should increase your heart rate and make you sweat (moderate-intensity exercise).  Do strength-training exercises at least twice a week.  Watch Your Levels of Cholesterol and Blood Lipids  Have your blood tested for lipids and cholesterol every 5 years starting at 73 years of age. If you are at high risk for heart disease, you should start having your blood tested when you are 73 years old. You may need to have your cholesterol levels checked more often if: ? Your lipid or cholesterol levels are high. ? You are older than 73 years of age. ? You are at high risk for heart disease.  What should I know about cancer screening? Many types of cancers can be detected early and may often be prevented. Lung Cancer  You should be screened every year for lung cancer if: ? You are a current smoker who has smoked for at least 30 years. ? You are a former smoker who has quit within the past 15 years.  Talk to your health care provider about your screening options, when you should start screening, and how often you should be screened.  Colorectal Cancer  Routine  colorectal cancer screening usually begins at 73 years of age and should be repeated every 5-10 years until you are 73 years old. You may need to be screened more often if early forms of precancerous polyps or small growths are found. Your health care provider may recommend screening at an earlier age if you have risk factors for colon cancer.  Your health care provider may recommend using home test kits to check for hidden blood in the stool.  A small camera at the end of a tube can be used to examine your colon (sigmoidoscopy or colonoscopy). This checks for the earliest forms of colorectal cancer.  Prostate and Testicular Cancer  Depending on your age and overall health, your health care provider may do certain tests to screen for prostate and testicular cancer.  Talk to your health care provider about any symptoms or concerns you have about testicular or prostate cancer.  Skin Cancer  Check your skin from head to toe regularly.  Tell your health care provider about any new moles or changes in moles, especially if: ? There is a change in a mole's size, shape, or color. ? You have a mole that is larger than a pencil eraser.  Always use sunscreen. Apply sunscreen liberally and repeat throughout the day.  Protect yourself by wearing long sleeves, pants, a wide-brimmed hat, and sunglasses when outside.  What should I know about heart disease, diabetes, and high blood pressure?  If you are 18-39 years of age, have   have your blood pressure checked every 3-5 years. If you are 40 years of age or older, have your blood pressure checked every year. You should have your blood pressure measured twice-once when you are at a hospital or clinic, and once when you are not at a hospital or clinic. Record the average of the two measurements. To check your blood pressure when you are not at a hospital or clinic, you can use: ? An automated blood pressure machine at a pharmacy. ? A home blood  pressure monitor.  Talk to your health care provider about your target blood pressure.  If you are between 45-79 years old, ask your health care provider if you should take aspirin to prevent heart disease.  Have regular diabetes screenings by checking your fasting blood sugar level. ? If you are at a normal weight and have a low risk for diabetes, have this test once every three years after the age of 45. ? If you are overweight and have a high risk for diabetes, consider being tested at a younger age or more often.  A one-time screening for abdominal aortic aneurysm (AAA) by ultrasound is recommended for men aged 65-75 years who are current or former smokers. What should I know about preventing infection? Hepatitis B If you have a higher risk for hepatitis B, you should be screened for this virus. Talk with your health care provider to find out if you are at risk for hepatitis B infection. Hepatitis C Blood testing is recommended for:  Everyone born from 1945 through 1965.  Anyone with known risk factors for hepatitis C.  Sexually Transmitted Diseases (STDs)  You should be screened each year for STDs including gonorrhea and chlamydia if: ? You are sexually active and are younger than 73 years of age. ? You are older than 73 years of age and your health care provider tells you that you are at risk for this type of infection. ? Your sexual activity has changed since you were last screened and you are at an increased risk for chlamydia or gonorrhea. Ask your health care provider if you are at risk.  Talk with your health care provider about whether you are at high risk of being infected with HIV. Your health care provider may recommend a prescription medicine to help prevent HIV infection.  What else can I do?  Schedule regular health, dental, and eye exams.  Stay current with your vaccines (immunizations).  Do not use any tobacco products, such as cigarettes, chewing tobacco, and  e-cigarettes. If you need help quitting, ask your health care provider.  Limit alcohol intake to no more than 2 drinks per day. One drink equals 12 ounces of beer, 5 ounces of wine, or 1 ounces of hard liquor.  Do not use street drugs.  Do not share needles.  Ask your health care provider for help if you need support or information about quitting drugs.  Tell your health care provider if you often feel depressed.  Tell your health care provider if you have ever been abused or do not feel safe at home. This information is not intended to replace advice given to you by your health care provider. Make sure you discuss any questions you have with your health care provider. Document Released: 05/20/2008 Document Revised: 07/21/2016 Document Reviewed: 08/26/2015 Elsevier Interactive Patient Education  2018 [employer]  

## 2018-04-25 ENCOUNTER — Other Ambulatory Visit: Payer: Self-pay | Admitting: *Deleted

## 2018-04-25 MED ORDER — LEVOTHYROXINE SODIUM 150 MCG PO TABS: 150.0000 ug | ORAL_TABLET | Freq: Every day | ORAL | 3 refills | Status: DC

## 2018-06-28 ENCOUNTER — Other Ambulatory Visit: Payer: Self-pay | Admitting: Family Medicine

## 2018-09-23 ENCOUNTER — Other Ambulatory Visit: Payer: Self-pay | Admitting: Family Medicine

## 2018-11-06 ENCOUNTER — Other Ambulatory Visit: Payer: Self-pay | Admitting: Family Medicine

## 2018-11-06 NOTE — Telephone Encounter (Signed)
Copied from [place] [phone]. Topic: Quick Communication - Rx Refill/Question >> Nov 06, 2018  6:58 PM Timothy Grant wrote: Medication: cyclobenzaprine (FLEXERIL) 10 MG tablet [[id number]]   Has the patient contacted their pharmacy? Yes  (Agent: If no, request that the patient contact the pharmacy the refill.) (Agent: If yes, when and what did the pharmacy advise?)  Preferred Pharmacy (with phone number or street name):  [facility] [address] [place] [address] Phone: [phone] Fax: [phone]   Agent: Please be advised that RX refills may take up to 3 business days. We ask that you follow-up with your pharmacy.

## 2018-11-06 NOTE — Telephone Encounter (Signed)
Pt requesting refill of Cyclobenzaprine previously filled by historical provider.  LOV: 03/22/18 Next OV: 03/28/19

## 2018-11-07 MED ORDER — CYCLOBENZAPRINE HCL 10 MG PO TABS: 10.0000 mg | ORAL_TABLET | ORAL | 2 refills | Status: DC | PRN

## 2018-12-18 ENCOUNTER — Other Ambulatory Visit: Payer: Self-pay

## 2018-12-18 ENCOUNTER — Ambulatory Visit (INDEPENDENT_AMBULATORY_CARE_PROVIDER_SITE_OTHER): Payer: Medicare Other | Admitting: Family Medicine

## 2018-12-18 ENCOUNTER — Encounter: Payer: Self-pay | Admitting: Family Medicine

## 2018-12-18 VITALS — BP 128/80 | HR 83 | Temp 99.1°F | Resp 16 | Ht 73.0 in | Wt 257.4 lb

## 2018-12-18 DIAGNOSIS — R5383 Other fatigue: Secondary | ICD-10-CM | POA: Diagnosis not present

## 2018-12-18 DIAGNOSIS — M791 Myalgia, unspecified site: Secondary | ICD-10-CM

## 2018-12-18 DIAGNOSIS — E039 Hypothyroidism, unspecified: Secondary | ICD-10-CM | POA: Diagnosis not present

## 2018-12-18 DIAGNOSIS — R195 Other fecal abnormalities: Secondary | ICD-10-CM

## 2018-12-18 DIAGNOSIS — R17 Unspecified jaundice: Secondary | ICD-10-CM

## 2018-12-18 LAB — POCT URINALYSIS DIPSTICK
Bilirubin, UA: 4
Blood, UA: NEGATIVE
Glucose, UA: NEGATIVE
Ketones, UA: 4
Leukocytes, UA: NEGATIVE
Nitrite, UA: NEGATIVE
Protein, UA: POSITIVE — AB
SPEC GRAV UA: 1.025 (ref 1.010–1.025)
Urobilinogen, UA: 0.2 E.U./dL
pH, UA: 6 (ref 5.0–8.0)

## 2018-12-18 LAB — CBC WITH DIFFERENTIAL/PLATELET
BASOS ABS: 0 10*3/uL (ref 0.0–0.1)
Basophils Relative: 0.5 % (ref 0.0–3.0)
EOS ABS: 0 10*3/uL (ref 0.0–0.7)
Eosinophils Relative: 1 % (ref 0.0–5.0)
HEMATOCRIT: 37.5 % — AB (ref 39.0–52.0)
HEMOGLOBIN: 12.9 g/dL — AB (ref 13.0–17.0)
LYMPHS PCT: 36.2 % (ref 12.0–46.0)
Lymphs Abs: 0.7 10*3/uL (ref 0.7–4.0)
MCHC: 34.5 g/dL (ref 30.0–36.0)
MCV: 95.3 fl (ref 78.0–100.0)
Monocytes Absolute: 0.2 10*3/uL (ref 0.1–1.0)
Monocytes Relative: 11.7 % (ref 3.0–12.0)
Neutro Abs: 1 10*3/uL — ABNORMAL LOW (ref 1.4–7.7)
Neutrophils Relative %: 50.6 % (ref 43.0–77.0)
PLATELETS: 123 10*3/uL — AB (ref 150.0–400.0)
RBC: 3.94 Mil/uL — ABNORMAL LOW (ref 4.22–5.81)
RDW: 15.2 % (ref 11.5–15.5)

## 2018-12-18 LAB — BASIC METABOLIC PANEL
BUN: 12 mg/dL (ref 6–23)
CHLORIDE: 92 meq/L — AB (ref 96–112)
CO2: 27 mEq/L (ref 19–32)
Calcium: 7.8 mg/dL — ABNORMAL LOW (ref 8.4–10.5)
Creatinine, Ser: 0.94 mg/dL (ref 0.40–1.50)
GFR: 83.52 mL/min (ref >60.00)
GLUCOSE: 112 mg/dL — AB (ref 70–99)
POTASSIUM: 4 meq/L (ref 3.5–5.1)
Sodium: 124 mEq/L — ABNORMAL LOW (ref 135–145)

## 2018-12-18 LAB — TSH: TSH: 0.28 u[IU]/mL — ABNORMAL LOW (ref 0.35–4.50)

## 2018-12-18 LAB — HEPATIC FUNCTION PANEL
ALK PHOS: 168 U/L — AB (ref 39–117)
ALT: 2544 U/L — AB (ref 0–53)
AST: 2677 U/L — AB (ref 0–37)
Albumin: 3 g/dL — ABNORMAL LOW (ref 3.5–5.2)
BILIRUBIN TOTAL: 7.1 mg/dL — AB (ref 0.2–1.2)
Bilirubin, Direct: 5 mg/dL — ABNORMAL HIGH (ref 0.0–0.3)
Total Protein: 5.7 g/dL — ABNORMAL LOW (ref 6.0–8.3)

## 2018-12-18 LAB — SEDIMENTATION RATE: SED RATE: 39 mm/h — AB (ref 0–20)

## 2018-12-18 LAB — CK: Total CK: 58 U/L (ref 7–232)

## 2018-12-18 LAB — LIPASE: Lipase: 36 U/L (ref 11.0–59.0)

## 2018-12-18 NOTE — Progress Notes (Signed)
Subjective  CC:  Chief Complaint  Patient presents with  . Lack of energy    Started last Tuesday.. Lower body aches and back tenderness.. Reports that this morning his stool was clay color and urine is Museum/gallery conservator colored    HPI: Timothy Grant is a 73 y.o. male who presents to [facility] today to establish care with me as a new patient.   He has the following concerns or needs:  Timothy Grant is here due to 1 week history of malaise, fatigue and myalgias.  He reports that symptoms started quite suddenly.  No associated fevers, upper respiratory symptoms, shortness of breath, abdominal pain, nausea vomiting or diarrhea.  His wife also reports that she noticed some yellowness to his skin over the last 24 hours.  He endorses itching, dark amber-colored urine and gray stools over the last 24 hours.  He has had a lack of appetite but no abdominal pain.  He reports he was exposed to hepatitis A about 2 months ago; a friend of theirs was staying at their house for a few days and was subsequently hospitalized for hepatitis A.  He since has recovered.  Patient denies right upper quadrant pain.  No history of acute hepatitis.  He is a rare alcohol drinker.  He does not take any supplements, rare Tylenol use.  He denies lower extremity edema or increasing abdominal girth.  He has been drinking plenty of fluids.  He denies irritative urinary symptoms.  No fevers.  Assessment  1. Jaundice   2. Other fatigue   3. Acquired hypothyroidism   4. Myalgia   5. Acholic stool      Plan   Jaundice: Differential diagnosis is broad but history and clinical exam could be consistent with an acute hepatitis.  Check lab work for LFTs, blood counts, lipase etc.  Once labs return, further imaging testing to be done if indicated.  Currently patient is stable.  We discussed red flag symptoms of blood in the stool, vomiting, abdominal pain, high fever, all of which would warrant emergency  room evaluation.  Close follow-up recommended.  Avoid alcohol and Tylenol.  See after visit summary for further instructions.  Follow up:  Return in about 1 year (around 12/19/2019) for recheck. Orders Placed This Encounter  Procedures  . Urine Culture  . CBC with Differential/Platelet  . Basic metabolic panel  . Hepatic function panel  . Sedimentation rate  . Hepatitis, Acute  . TSH  . CK (Creatine Kinase)  . Lipase  . POCT urinalysis dipstick   No orders of the defined types were placed in this encounter.    Depression screen [facility] 2/9 03/22/2018 11/08/2017  Decreased Interest 0 0  Down, Depressed, Hopeless 0 0  PHQ - 2 Score 0 0    We updated and reviewed the patient's past history in detail and it is documented below.  Patient Active Problem List   Diagnosis Date Noted  . Acquired hypothyroidism 02/05/2011    Priority: High  . Adenomatous colon polyp 02/05/2011    Priority: High    Overview:  Adenomatous polyps, q 3-5 year screens, last 2015, Dr. Collene Mares   . Mixed hyperlipidemia 02/05/2011    Priority: High  . Osteoarthritis of finger of left hand 01/07/2017    Priority: Medium  . Leukopenia 08/09/2012    Priority: Medium    Overview:  mild   . Trigger finger, left ring finger 01/07/2017    Priority: Low  .  Male erectile disorder 02/09/2012    Priority: Low   Health Maintenance  Topic Date Due  . COLONOSCOPY  02/23/2019  . TETANUS/TDAP  02/04/2021  . INFLUENZA VACCINE  Completed  . Hepatitis C Screening  Completed  . PNA vac Low Risk Adult  Completed   Immunization History  Administered Date(s) Administered  . Hepatitis B, ped/adol 08/06/2009  . Influenza Split 08/07/2011, 08/28/2013  . Influenza, High Dose Seasonal PF 09/26/2017  . Influenza, Quadrivalent, Recombinant, Inj, Pf 09/06/2016  . Influenza, Seasonal, Injecte, Preservative Fre 09/04/2014, 09/09/2015  . Influenza,trivalent, recombinat, inj, PF 08/09/2012  . Pneumococcal Conjugate-13 11/04/2014    . Pneumococcal Polysaccharide-23 02/05/2011  . Tdap 02/05/2011  . Zoster 02/05/2011   Current Meds  Medication Sig  . aspirin EC 81 MG tablet Take 162 mg by mouth daily.  . Cholecalciferol (VITAMIN D3) 5000 units TABS Take 1 tablet by mouth daily.   . cyclobenzaprine (FLEXERIL) 10 MG tablet Take 1 tablet (10 mg total) by mouth as needed.  . Glucosamine HCl 1000 MG TABS Take 2 tablets by mouth daily.  Marland Kitchen levothyroxine (SYNTHROID, LEVOTHROID) 150 MCG tablet Take 1 tablet (150 mcg total) by mouth daily before breakfast.  . Multiple Vitamin (MULTIVITAMIN) capsule Take 1 capsule by mouth daily.   . naproxen sodium (ALEVE) 220 MG tablet Take 220 mg by mouth as needed.  Marland Kitchen omega-3 acid ethyl esters (LOVAZA) 1 g capsule Take 1 g by mouth daily.   . sildenafil (REVATIO) 20 MG tablet Use 5 tablets PO as needed    Allergies: Patient has No Known Allergies. Past Medical History Patient  has a past medical history of Adenomatous polyps, ED (erectile dysfunction), Hyperlipidemia, Hypothyroidism, and Varicocele (02/01/2011). Past Surgical History Patient  has a past surgical history that includes Tonsillectomy; Eye surgery (Right, 1956); and Eye surgery (Left, 1963). Family History: Patient family history includes Alzheimer's disease in his mother; Arthritis in his mother; Cancer in his brother and sister; Dementia in his mother; Heart disease in his father and paternal grandmother; Hyperlipidemia in his brother; Stroke in his brother and mother. Social History:  Patient  reports that he has never smoked. He has never used smokeless tobacco. He reports current alcohol use. He reports that he does not use drugs.  Review of Systems: Constitutional: negative for fever or malaise Ophthalmic: negative for photophobia, double vision or loss of vision Cardiovascular: negative for chest pain, dyspnea on exertion, or new LE swelling Respiratory: negative for SOB or persistent cough Gastrointestinal:  negative for abdominal pain, change in bowel habits or melena Genitourinary: negative for dysuria or gross hematuria Musculoskeletal: negative for new gait disturbance or muscular weakness Integumentary: negative for new or persistent rashes Neurological: negative for TIA or stroke symptoms Psychiatric: negative for SI or delusions Allergic/Immunologic: negative for hives  Patient Care Team    Relationship Specialty Notifications Start End  [name], MD PCP - General Family Medicine  11/07/17   Stephannie Li, [place]  Ophthalmology  03/22/18   Daryll Brod, MD Consulting Physician Orthopedic Surgery  03/22/18   [place]  Dentistry  03/22/18   Juanita Craver, MD Consulting Physician Gastroenterology  03/22/18     Objective  Vitals: BP 128/80   Pulse 83   Temp 99.1 F (37.3 C) (Oral)   Resp 16   Ht 6\' 1"  (1.854 m)   Wt 257 lb 6.4 oz (116.8 kg)   SpO2 99%   BMI 33.96 kg/m  General:  Well developed, well nourished, no acute distress  Psych:  Alert and oriented,normal mood and affect HEENT:  Normocephalic, atraumatic, icteric sclera, PERRL, oropharynx is without mass or exudate, supple neck without adenopathy, mass or thyromegaly Cardiovascular:  RRR without gallop, rub or murmur, nondisplaced PMI, no peripheral edema Respiratory:  Good breath sounds bilaterally, CTAB with normal respiratory effort Gastrointestinal: normal bowel sounds, soft, non-tender, no noted masses. No HSM, no ascites MSK: no deformities, contusions. Joints are without erythema or swelling Skin:  Warm, jaundice throughout, no palmar erythema Neurologic:    Mental status is normal. Gross motor and sensory exams are normal. Normal gait  Lab Results  Component Value Date   ALT 23 03/22/2018   AST 23 03/22/2018   ALKPHOS 69 03/22/2018   BILITOT 0.6 03/22/2018   Lab Results  Component Value Date   WBC 3.7 (L) 11/08/2017   HGB 13.8 11/08/2017   HCT 40.1 11/08/2017   MCV 97.3 11/08/2017   PLT 152.0 11/08/2017    Office Visit on 12/18/2018  Component Date Value Ref Range Status  . Color, UA 12/18/2018 Amber   Final  . Glucose, UA 12/18/2018 Negative  Negative Final  . Bilirubin, UA 12/18/2018 4   Final  . Ketones, UA 12/18/2018 4   Final  . Spec Grav, UA 12/18/2018 1.025  1.010 - 1.025 Final  . Blood, UA 12/18/2018 Negative   Final  . pH, UA 12/18/2018 6.0  5.0 - 8.0 Final  . Protein, UA 12/18/2018 Positive* Negative Final  . Urobilinogen, UA 12/18/2018 0.2  0.2 or 1.0 E.U./dL Final  . Nitrite, UA 12/18/2018 Negative   Final  . Leukocytes, UA 12/18/2018 Negative  Negative Final     Commons side effects, risks, benefits, and alternatives for medications and treatment plan prescribed today were discussed, and the patient expressed understanding of the given instructions. Patient is instructed to call or message via MyChart if he/she has any questions or concerns regarding our treatment plan. No barriers to understanding were identified. We discussed Red Flag symptoms and signs in detail. Patient expressed understanding regarding what to do in case of urgent or emergency type symptoms.   Medication list was reconciled, printed and provided to the patient in AVS. Patient instructions and summary information was reviewed with the patient as documented in the AVS. This note was prepared with assistance of Dragon voice recognition software. Occasional wrong-word or sound-a-like substitutions may have occurred due to the inherent limitations of voice recognition software

## 2018-12-18 NOTE — Patient Instructions (Signed)
Please return in 1 week for recheck.  I will call you with results and further testing recommendations.   If you have any questions or concerns, please don't hesitate to send me a message via MyChart or call the office at [phone]. Thank you for visiting with [place] today! It's our pleasure caring for you.   Jaundice, Adult  Jaundice is a yellowish discoloration of the skin, the whites of the eyes, and the mucous membranes. Jaundice can be a sign that the liver or the bile system is not working properly. What are the causes? This condition is caused by an increase in the level of bilirubin in the blood. Bilirubin is a substance that is produced by the normal breakdown of red blood cells. Conditions and activities that can cause an increase in the bilirubin level include:  Gallstones, if they block the bile ducts.  Alcoholic liver disease.  Other liver diseases, such as cirrhosis. Cirrhosis is scarring of the liver.  Certain cancers that may block the bile ducts, such as pancreatic cancer.  Certain infections, such as viral hepatitis.  Certain genetic syndromes, such as Gilbert syndrome.  Certain medicines, such as acetaminophen if used in excess. What are the signs or symptoms? Symptoms of this condition include:  Yellow color to the skin, the whites of the eyes, or the mucous membranes.  Dark brown urine.  Stomach pain.  Light or clay-colored stool.  Itchy skin (pruritus). How is this diagnosed? This condition is diagnosed with medical history, physical exam, blood tests, and imaging tests. You may have additional tests to check for other causes of increased bilirubin levels in your blood or to rule out other possible causes. How is this treated? Treatment for this condition depends on the cause of your jaundice and other underlying conditions. Treatment may include:  Stopping the use of certain medicines.  Giving fluids through an IV that is inserted into one of your  veins.  Using medicines to treat pruritus.  Doing surgery, if there is blockage of the bile ducts. Follow these instructions at home:   Take over-the-counter and prescription medicines only as told by your health care provider.  Use skin lotion to relieve itching.  Drink plenty of fluid.  Do not drink alcohol.  Keep all follow-up visits as told by your health care provider. This is important. Contact a health care provider if you have:  A fever.  Abdominal pain or swelling. Get help right away if:  Your symptoms suddenly get worse.  Your pain gets worse.  You vomit repeatedly.  You vomit blood.  You become weak or confused.  You develop a severe headache.  You have blood in your stool.  You become severely dehydrated. Signs of severe dehydration include: ? A very dry mouth. ? A rapid, weak pulse. ? Rapid breathing. ? Blue lips. Summary  Jaundice is a yellowish discoloration of the skin, the whites of the eyes, and the mucous membranes. Jaundice can be a sign that the liver or the bile system is not working properly.  Symptoms include a yellow color to the skin or eyes, dark urine, and a change in stool color.  Your health care provider will need to do further testing, including blood tests and imaging studies.  Get help right away if your symptoms suddenly get worse, you become weak and confused, you vomit, you have blood in your stool, you have a severe headache, or you become dehydrated. This information is not intended to replace advice given to  you by your health care provider. Make sure you discuss any questions you have with your health care provider. Document Released: 11/22/2005 Document Revised: 11/25/2017 Document Reviewed: 11/25/2017 Elsevier Interactive Patient Education  2019 [employer].

## 2018-12-22 ENCOUNTER — Telehealth: Payer: Self-pay

## 2018-12-22 LAB — HEPATITIS PANEL, ACUTE

## 2018-12-22 LAB — URINE CULTURE
MICRO NUMBER:: 48080
Result:: NO GROWTH
SPECIMEN QUALITY:: ADEQUATE

## 2018-12-22 NOTE — Telephone Encounter (Signed)
Spoke with Timothy Grant from [facility]. She stated they did not receive serum, but I did send it. I have it written in my labels booklet. She is having the lab look for the tube, but I will go ahead and schedule Timothy Grant for a lab appt on Monday to redraw.

## 2018-12-22 NOTE — Telephone Encounter (Signed)
He is on my schedule I believe. Thank you

## 2018-12-22 NOTE — Telephone Encounter (Signed)
-----   Message from [name], MD sent at 12/22/2018  3:02 PM EST ----- Timothy Grant, Can you please follow up on this. I REALLY need this test result asap!

## 2018-12-25 ENCOUNTER — Encounter: Payer: Self-pay | Admitting: Family Medicine

## 2018-12-25 ENCOUNTER — Ambulatory Visit (INDEPENDENT_AMBULATORY_CARE_PROVIDER_SITE_OTHER): Payer: Medicare Other | Admitting: Family Medicine

## 2018-12-25 ENCOUNTER — Other Ambulatory Visit: Payer: Self-pay

## 2018-12-25 VITALS — BP 122/82 | HR 63 | Temp 98.1°F | Resp 16 | Ht 73.0 in | Wt 249.1 lb

## 2018-12-25 DIAGNOSIS — B179 Acute viral hepatitis, unspecified: Secondary | ICD-10-CM

## 2018-12-25 DIAGNOSIS — R17 Unspecified jaundice: Secondary | ICD-10-CM

## 2018-12-25 LAB — CBC WITH DIFFERENTIAL/PLATELET
Basophils Relative: 0 % (ref 0.0–3.0)
Eosinophils Relative: 0 % (ref 0.0–5.0)
HCT: 36.1 % — ABNORMAL LOW (ref 39.0–52.0)
Hemoglobin: 12.5 g/dL — ABNORMAL LOW (ref 13.0–17.0)
LYMPHS PCT: 25 % (ref 12.0–46.0)
MCHC: 34.7 g/dL (ref 30.0–36.0)
MCV: 93 fl (ref 78.0–100.0)
Monocytes Relative: 17 % — ABNORMAL HIGH (ref 3.0–12.0)
NEUTROS PCT: 58 % (ref 43.0–77.0)
Platelets: 167 10*3/uL (ref 150.0–400.0)
RBC: 3.88 Mil/uL — ABNORMAL LOW (ref 4.22–5.81)
RDW: 15.2 % (ref 11.5–15.5)
WBC: 3 10*3/uL — AB (ref 4.0–10.5)

## 2018-12-25 LAB — BASIC METABOLIC PANEL
BUN: 11 mg/dL (ref 6–23)
CO2: 23 mEq/L (ref 19–32)
Calcium: 8.6 mg/dL (ref 8.4–10.5)
Chloride: 97 mEq/L (ref 96–112)
Creatinine, Ser: 0.99 mg/dL (ref 0.40–1.50)
GFR: 74.02 mL/min (ref >60.00)
Glucose, Bld: 91 mg/dL (ref 70–99)
Potassium: 4.5 mEq/L (ref 3.5–5.1)
Sodium: 129 mEq/L — ABNORMAL LOW (ref 135–145)

## 2018-12-25 LAB — HEPATIC FUNCTION PANEL
ALT: 703 U/L — AB (ref 0–53)
AST: 389 U/L — ABNORMAL HIGH (ref 0–37)
Albumin: 3.1 g/dL — ABNORMAL LOW (ref 3.5–5.2)
Alkaline Phosphatase: 201 U/L — ABNORMAL HIGH (ref 39–117)
BILIRUBIN TOTAL: 13.8 mg/dL — AB (ref 0.2–1.2)
Bilirubin, Direct: 8.6 mg/dL — ABNORMAL HIGH (ref 0.0–0.3)
Total Protein: 6.2 g/dL (ref 6.0–8.3)

## 2018-12-25 NOTE — Patient Instructions (Signed)
Refer to patient education summary printed from up-to-date on acute hepatitis A.

## 2018-12-25 NOTE — Progress Notes (Signed)
heat   Subjective  CC:  Chief Complaint  Patient presents with  . Follow-up    HPI: Timothy Grant is a 73 y.o. male who presents to the office today to address the problems listed above in the chief complaint.  Timothy Grant returns for follow-up of his acute hepatitis and jaundice.  See last note.  He continues to be jaundiced but otherwise is feeling a little bit better.  Energy levels are improving.  Still with low appetite but no longer having nausea.  He has had no abdominal pain.  No fevers or chills.  He does have some pruritus.  He has had some diarrhea.  No longer having acholic stools.  We reviewed his lab work in detail: Significant for elevated LFTs and bilirubin.  All in the level of acute viral hepatitis, most likely due to hepatitis a due to his recent exposure. Assessment  1. Acute hepatitis   2. Jaundice      Plan   Acute hepatitis a, presumed: Redrawing acute hepatitis panel today since lab lost his last specimen.  Recheck LFTs, sodium and blood counts.  Clinically he is improving.  Extensive education given regarding incubation period, contagiousness, rare complications, and possible need for referral to GI.  Await blood work and further recommendations depending upon how his results look.  Clinically he is mildly improved.  Follow up: Return in about 8 weeks (around 02/19/2019) for recheck.  03/28/2019  Orders Placed This Encounter  Procedures  . Hepatic function panel  . Hepatitis, Acute  . Basic metabolic panel  . CBC with Differential/Platelet   No orders of the defined types were placed in this encounter.     I reviewed the patients updated PMH, FH, and SocHx.    Patient Active Problem List   Diagnosis Date Noted  . Acquired hypothyroidism 02/05/2011    Priority: High  . Adenomatous colon polyp 02/05/2011    Priority: High  . Mixed hyperlipidemia 02/05/2011    Priority: High  . Osteoarthritis of finger of left hand 01/07/2017    Priority:  Medium  . Leukopenia 08/09/2012    Priority: Medium  . Trigger finger, left ring finger 01/07/2017    Priority: Low  . Male erectile disorder 02/09/2012    Priority: Low   Current Meds  Medication Sig  . aspirin EC 81 MG tablet Take 162 mg by mouth daily.  . Cholecalciferol (VITAMIN D3) 5000 units TABS Take 1 tablet by mouth daily.   . cyclobenzaprine (FLEXERIL) 10 MG tablet Take 1 tablet (10 mg total) by mouth as needed.  . Glucosamine HCl 1000 MG TABS Take 2 tablets by mouth daily.  Marland Kitchen levothyroxine (SYNTHROID, LEVOTHROID) 150 MCG tablet Take 1 tablet (150 mcg total) by mouth daily before breakfast.  . Multiple Vitamin (MULTIVITAMIN) capsule Take 1 capsule by mouth daily.   . naproxen sodium (ALEVE) 220 MG tablet Take 220 mg by mouth as needed.  Marland Kitchen omega-3 acid ethyl esters (LOVAZA) 1 g capsule Take 1 g by mouth daily.   . sildenafil (REVATIO) 20 MG tablet Use 5 tablets PO as needed    Allergies: Patient has No Known Allergies. Family History: Patient family history includes Alzheimer's disease in his mother; Arthritis in his mother; Cancer in his brother and sister; Dementia in his mother; Heart disease in his father and paternal grandmother; Hyperlipidemia in his brother; Stroke in his brother and mother. Social History:  Patient  reports that he has never smoked. He has never used smokeless tobacco.  He reports current alcohol use. He reports that he does not use drugs.  Review of Systems: Constitutional: Negative for fever malaise or anorexia Cardiovascular: negative for chest pain Respiratory: negative for SOB or persistent cough Gastrointestinal: negative for abdominal pain  Objective  Vitals: BP 122/82   Pulse 63   Temp 98.1 F (36.7 C) (Oral)   Resp 16   Ht 6\' 1"  (1.854 m)   Wt 249 lb 2 oz (113 kg)   SpO2 97%   BMI 32.87 kg/m  General: no acute distress , A&Ox3 HEENT: PEERL, conjunctiva icteric, Oropharynx moist,neck is supple Cardiovascular:  RRR without murmur  or gallop.  Respiratory:  Good breath sounds bilaterally, CTAB with normal respiratory effort Skin:  Warm, jaundice persists Gastrointestinal: soft, flat abdomen, normal active bowel sounds, no palpable masses, no hepatosplenomegaly, no appreciated hernias  No visits with results within 1 Day(s) from this visit.  Latest known visit with results is:  Office Visit on 12/18/2018  Component Date Value Ref Range Status  . WBC 12/18/2018 2.0 Repeated and verified X2.* 4.0 - 10.5 K/uL Final  . RBC 12/18/2018 3.94* 4.22 - 5.81 Mil/uL Final  . Hemoglobin 12/18/2018 12.9* 13.0 - 17.0 g/dL Final  . HCT 12/18/2018 37.5* 39.0 - 52.0 % Final  . MCV 12/18/2018 95.3  78.0 - 100.0 fl Final  . MCHC 12/18/2018 34.5  30.0 - 36.0 g/dL Final  . RDW 12/18/2018 15.2  11.5 - 15.5 % Final  . Platelets 12/18/2018 123.0* 150.0 - 400.0 K/uL Final  . Neutrophils Relative % 12/18/2018 50.6  43.0 - 77.0 % Final  . Lymphocytes Relative 12/18/2018 36.2  12.0 - 46.0 % Final  . Monocytes Relative 12/18/2018 11.7  3.0 - 12.0 % Final  . Eosinophils Relative 12/18/2018 1.0  0.0 - 5.0 % Final  . Basophils Relative 12/18/2018 0.5  0.0 - 3.0 % Final  . Neutro Abs 12/18/2018 1.0* 1.4 - 7.7 K/uL Final  . Lymphs Abs 12/18/2018 0.7  0.7 - 4.0 K/uL Final  . Monocytes Absolute 12/18/2018 0.2  0.1 - 1.0 K/uL Final  . Eosinophils Absolute 12/18/2018 0.0  0.0 - 0.7 K/uL Final  . Basophils Absolute 12/18/2018 0.0  0.0 - 0.1 K/uL Final  . Sodium 12/18/2018 124* 135 - 145 mEq/L Final  . Potassium 12/18/2018 4.0  3.5 - 5.1 mEq/L Final  . Chloride 12/18/2018 92* 96 - 112 mEq/L Final  . CO2 12/18/2018 27  19 - 32 mEq/L Final  . Glucose, Bld 12/18/2018 112* 70 - 99 mg/dL Final  . BUN 12/18/2018 12  6 - 23 mg/dL Final  . Creatinine, Ser 12/18/2018 0.94  0.40 - 1.50 mg/dL Final  . Calcium 12/18/2018 7.8* 8.4 - 10.5 mg/dL Final  . GFR 12/18/2018 83.52  >60.00 mL/min Final  . Total Bilirubin 12/18/2018 7.1* 0.2 - 1.2 mg/dL Final  .  Bilirubin, Direct 12/18/2018 5.0* 0.0 - 0.3 mg/dL Final  . Alkaline Phosphatase 12/18/2018 168* 39 - 117 U/L Final  . AST 12/18/2018 2,677* 0 - 37 U/L Final  . ALT 12/18/2018 2,544* 0 - 53 U/L Final  . Total Protein 12/18/2018 5.7* 6.0 - 8.3 g/dL Final  . Albumin 12/18/2018 3.0* 3.5 - 5.2 g/dL Final  . Sed Rate 12/18/2018 39* 0 - 20 mm/hr Final  . Color, UA 12/18/2018 Amber   Final  . Glucose, UA 12/18/2018 Negative  Negative Final  . Bilirubin, UA 12/18/2018 4   Final  . Ketones, UA 12/18/2018 4   Final  .  Spec Grav, UA 12/18/2018 1.025  1.010 - 1.025 Final  . Blood, UA 12/18/2018 Negative   Final  . pH, UA 12/18/2018 6.0  5.0 - 8.0 Final  . Protein, UA 12/18/2018 Positive* Negative Final  . Urobilinogen, UA 12/18/2018 0.2  0.2 or 1.0 E.U./dL Final  . Nitrite, UA 12/18/2018 Negative   Final  . Leukocytes, UA 12/18/2018 Negative  Negative Final  . MICRO NUMBER: 12/18/2018 [id number]   Final  . SPECIMEN QUALITY: 12/18/2018 Adequate   Final  . Sample Source 12/18/2018 NOT GIVEN   Final  . STATUS: 12/18/2018 FINAL   Final  . Result: 12/18/2018 No Growth   Final  . Hep A IgM 12/18/2018 CANCELED   Final  . Hepatitis B Surface Ag 12/18/2018 CANCELED   Final  . Hep B C IgM 12/18/2018 CANCELED   Final  . Hepatitis C Ab 12/18/2018 CANCELED   Final  . TSH 12/18/2018 0.28* 0.35 - 4.50 uIU/mL Final  . Total CK 12/18/2018 58  7 - 232 U/L Final  . Lipase 12/18/2018 36.0  11.0 - 59.0 U/L Final        Commons side effects, risks, benefits, and alternatives for medications and treatment plan prescribed today were discussed, and the patient expressed understanding of the given instructions. Patient is instructed to call or message via MyChart if he/she has any questions or concerns regarding our treatment plan. No barriers to understanding were identified. We discussed Red Flag symptoms and signs in detail. Patient expressed understanding regarding what to do in case of urgent or emergency type  symptoms.   Medication list was reconciled, printed and provided to the patient in AVS. Patient instructions and summary information was reviewed with the patient as documented in the AVS. This note was prepared with assistance of Dragon voice recognition software. Occasional wrong-word or sound-a-like substitutions may have occurred due to the inherent limitations of voice recognition software

## 2018-12-26 ENCOUNTER — Telehealth: Payer: Self-pay | Admitting: Family Medicine

## 2018-12-26 ENCOUNTER — Other Ambulatory Visit: Payer: Self-pay | Admitting: *Deleted

## 2018-12-26 DIAGNOSIS — B179 Acute viral hepatitis, unspecified: Secondary | ICD-10-CM

## 2018-12-26 LAB — HEPATITIS PANEL, ACUTE
HEP B C IGM: NONREACTIVE
Hep A IgM: REACTIVE — AB
Hepatitis B Surface Ag: NONREACTIVE
Hepatitis C Ab: NONREACTIVE
SIGNAL TO CUT-OFF: 0.04 (ref <1.00)

## 2018-12-26 NOTE — Telephone Encounter (Signed)
Copied from [place]. Topic: Quick Communication - Lab Results (Clinic Use ONLY) >> Dec 26, 2018  9:37 AM Layla Barter, CMA wrote: Jari Sportsman for [facility] triage to release information to patient.

## 2018-12-26 NOTE — Telephone Encounter (Signed)
Copied from [place]. Topic: Quick Communication - Lab Results (Clinic Use ONLY) >> Dec 26, 2018  9:37 AM Layla Barter, CMA wrote: Timothy Grant for [facility] triage to release information to patient. Pt is call requesting lab results

## 2018-12-29 ENCOUNTER — Telehealth: Payer: Self-pay | Admitting: Family Medicine

## 2018-12-29 ENCOUNTER — Ambulatory Visit
Admission: RE | Admit: 2018-12-29 | Discharge: 2018-12-29 | Disposition: A | Discharge status: Home / Self Care | Payer: Medicare Other | Source: Ambulatory Visit | Attending: Family Medicine | Admitting: Family Medicine

## 2018-12-29 DIAGNOSIS — B179 Acute viral hepatitis, unspecified: Secondary | ICD-10-CM

## 2018-12-29 NOTE — Telephone Encounter (Signed)
Timothy Grant calling with results from U/s abd limited. Results available in Epic.  IMPRESSION: 1. Gallbladder wall is thickened and appears somewhat edematous. No gallstones are evident. The appearance of the gallbladder is concerning for a degree of acalculus cholecystitis. This finding may warrant correlation with nuclear medicine hepatobiliary imaging study to assess for cystic duct patency.  2.  There is no appreciable biliary duct dilatation.  3. Mild increase in liver echogenicity. There may be a degree of hepatic steatosis. No focal liver lesions are evident.

## 2019-01-01 ENCOUNTER — Other Ambulatory Visit: Payer: Self-pay | Admitting: Family Medicine

## 2019-01-01 ENCOUNTER — Other Ambulatory Visit: Payer: Self-pay | Admitting: *Deleted

## 2019-01-01 DIAGNOSIS — B159 Hepatitis A without hepatic coma: Secondary | ICD-10-CM

## 2019-01-01 DIAGNOSIS — R932 Abnormal findings on diagnostic imaging of liver and biliary tract: Secondary | ICD-10-CM

## 2019-01-01 DIAGNOSIS — E041 Nontoxic single thyroid nodule: Secondary | ICD-10-CM

## 2019-01-01 NOTE — Progress Notes (Signed)
Please call pt: will refer to GI due to swollen gallbladder on his ultrasound. He may need further testing. Thanks

## 2019-01-01 NOTE — Progress Notes (Signed)
Pt is aware and verbalized understanding.

## 2019-01-03 ENCOUNTER — Other Ambulatory Visit (INDEPENDENT_AMBULATORY_CARE_PROVIDER_SITE_OTHER): Payer: Medicare Other

## 2019-01-03 ENCOUNTER — Ambulatory Visit: Payer: Medicare Other | Admitting: Gastroenterology

## 2019-01-03 ENCOUNTER — Encounter: Payer: Self-pay | Admitting: Gastroenterology

## 2019-01-03 VITALS — BP 130/84 | HR 81 | Ht 73.0 in | Wt 245.0 lb

## 2019-01-03 DIAGNOSIS — R932 Abnormal findings on diagnostic imaging of liver and biliary tract: Secondary | ICD-10-CM

## 2019-01-03 DIAGNOSIS — B159 Hepatitis A without hepatic coma: Secondary | ICD-10-CM

## 2019-01-03 DIAGNOSIS — R748 Abnormal levels of other serum enzymes: Secondary | ICD-10-CM

## 2019-01-03 LAB — CBC WITH DIFFERENTIAL/PLATELET
Basophils Absolute: 0 10*3/uL (ref 0.0–0.1)
Basophils Relative: 1.1 % (ref 0.0–3.0)
Eosinophils Absolute: 0 10*3/uL (ref 0.0–0.7)
Eosinophils Relative: 0.8 % (ref 0.0–5.0)
HCT: 35 % — ABNORMAL LOW (ref 39.0–52.0)
Hemoglobin: 12.1 g/dL — ABNORMAL LOW (ref 13.0–17.0)
LYMPHS ABS: 1.2 10*3/uL (ref 0.7–4.0)
Lymphocytes Relative: 43.2 % (ref 12.0–46.0)
MCHC: 34.6 g/dL (ref 30.0–36.0)
MCV: 92.9 fl (ref 78.0–100.0)
Monocytes Absolute: 0.3 10*3/uL (ref 0.1–1.0)
Monocytes Relative: 9.9 % (ref 3.0–12.0)
NEUTROS PCT: 45 % (ref 43.0–77.0)
Neutro Abs: 1.3 10*3/uL — ABNORMAL LOW (ref 1.4–7.7)
PLATELETS: 135 10*3/uL — AB (ref 150.0–400.0)
RBC: 3.76 Mil/uL — ABNORMAL LOW (ref 4.22–5.81)
RDW: 17.9 % — ABNORMAL HIGH (ref 11.5–15.5)
WBC: 2.9 10*3/uL — ABNORMAL LOW (ref 4.0–10.5)

## 2019-01-03 LAB — HEPATIC FUNCTION PANEL
ALT: 381 U/L — ABNORMAL HIGH (ref 0–53)
AST: 482 U/L — ABNORMAL HIGH (ref 0–37)
Albumin: 3.1 g/dL — ABNORMAL LOW (ref 3.5–5.2)
Alkaline Phosphatase: 221 U/L — ABNORMAL HIGH (ref 39–117)
BILIRUBIN TOTAL: 15 mg/dL — AB (ref 0.2–1.2)
Bilirubin, Direct: 9 mg/dL — ABNORMAL HIGH (ref 0.0–0.3)
Total Protein: 6.4 g/dL (ref 6.0–8.3)

## 2019-01-03 LAB — PROTIME-INR
INR: 1.3 ratio — AB (ref 0.8–1.0)
Prothrombin Time: 15.1 s — ABNORMAL HIGH (ref 9.6–13.1)

## 2019-01-03 LAB — BASIC METABOLIC PANEL
BUN: 12 mg/dL (ref 6–23)
CO2: 25 mEq/L (ref 19–32)
Calcium: 8.7 mg/dL (ref 8.4–10.5)
Chloride: 99 mEq/L (ref 96–112)
Creatinine, Ser: 0.96 mg/dL (ref 0.40–1.50)
GFR: 76.69 mL/min (ref >60.00)
Glucose, Bld: 82 mg/dL (ref 70–99)
Potassium: 3.9 mEq/L (ref 3.5–5.1)
SODIUM: 129 meq/L — AB (ref 135–145)

## 2019-01-03 LAB — BILIRUBIN, DIRECT: BILIRUBIN DIRECT: 9 mg/dL — AB (ref 0.0–0.3)

## 2019-01-03 MED ORDER — CHOLESTYRAMINE 4 G PO PACK: 4.0000 g | PACK | Freq: Four times a day (QID) | ORAL | 0 refills | Status: DC

## 2019-01-03 NOTE — Progress Notes (Signed)
Referring Provider: [name], MD Primary Care Physician:  [name], MD   Reason for Consultation: Abnormal gallbladder ultrasound   IMPRESSION:  Acute hepatitis A with identified exposure Direct hyperbilirubinemia Abnormal transaminases    - normal early 2019     - acute elevation noted in 12/2018 Abnormal gallbladder ultrasound 12/29/2018    -Echogenic liver    - diffusely thickened and edematous gallbladder, no pericholecystic fluid    - negative sonographic Murphy sign, no biliary dilatation  I suspect Timothy Grant has cholestatic hepatitis A. Peak bilirubin levels may be reached in the second month or even later, and if can take 6 months for labs to normalize. Unfortunately, treatment of HAV is supportive.  The differential also includes other viruses (EBV, HIV, HBV, HCV, influenza) despite initial negative testing for HBV and HCV. HAV can trigger autoimmune hepatitis, so this must be considered. It is less likely to be drug-induced liver injury without obvious offending agents. His transaminases would not be this high in the setting of alcoholic hepatitis.  I am recommending additional labs and an MRI/MRCP if his labs are not improving.  Will want to follow liver enzymes and PT/INR weekly until it is clear that all tests are normalizing.   Trial of cholestyramine for loose stools and associated pruritus.   His gallbladder findings on ultrasound are likely due to the acute hepatitis. He has no symptoms to support symptomatic biliary disease. Would only pursue additional evaluation of the gallbladder with new onset symptoms.   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  The history is obtained through the patient and review of his electronic health record.  Friend visiting in November and slept on the couch for several days. The friend was diagnosed with acute hepatitis A several days after his visit. He was hospitalized for 4 days in [place]. Thought to have been exposed through a prostitute.   Patient felt well throughout November. Spent time in December in [place]. Wife noticed that he was fatigued while in [place]. But, he doesn't remember feeling unusual.   Early January felt terrible with 7 days of progressive symptoms with predominantly malaise and fatigue. Progressively more jaundiced.  Dark urine noted around that time. Diffuse, pruritis. Gray clay, loosely formed stools developed around that time, as well. He will have 3-4 BM daily.   Starting to get stronger every day.  Increased gas, eructation, and flatulence. No abdominal pain, nausea, vomiting. GI ROS is otherwise negative.   Recently diagnosed with acute hepatitis A. Liver enzymes were normal in early 2019.    Labs 12/18/2018 show an AST 2677, ALT 2544, alk phos 168, total bilirubin 7.1, direct bilirubin 5.0.  Acute hepatitis panel 12/25/2018 showed hepatitis A antibody IgM positive.  Hep B surface antigen and hep B core antibody total IgM as well as hep C antibody were negative.  Repeat labs 12/25/2018 showed AST 389, ALT 703, alk phos  209, but the bilirubin had increased to 13.8.  Direct bilirubin of 8.6.  An ultrasound was obtained to evaluate the increase in bilirubin.  The right upper quadrant ultrasound 12/29/2018 showed the gallbladder is diffusely thickened and edematous.  There is no pericholecystic fluid.  No sonographic Murphy sign.  No  intrahepatic or extrahepatic bile duct dilation.  No liver lesions identified.  The liver echogenicity is mildly increased.  Wife has not been sick but she given immunoglobulin and vaccine. No other known sick contacts.  No additional medications over the last 6-12 months. Stopped Crestor earlier this month when he became jaundiced after 5 years of daily use without trouble. No antibiotics. Muscular pain with other statins. No herbals, OTC, or complementary medication use.  Stopped all alcohol earlier this month. Social alcohol use over Christmas. No heavy use.   No new sexual partners.  Distant blood donation. Platelet donation 2 years ago. No prior blood transfusion. No family history of liver disease.    Past Medical History:  Diagnosis Date  . Adenomatous polyps   . ED (erectile dysfunction)   . Hyperlipidemia   . Hypothyroidism   . Varicocele 02/01/2011   Left testicle    Past Surgical History:  Procedure Laterality Date  . EYE SURGERY Right 1956  . EYE SURGERY Left 1963  . TONSILLECTOMY      Current Outpatient Medications  Medication Sig Dispense Refill  . aspirin EC 81 MG tablet Take 162 mg by mouth daily.    . Cholecalciferol (VITAMIN D3) 5000 units TABS Take 1 tablet by mouth daily.     . cyclobenzaprine (FLEXERIL) 10 MG tablet Take 1 tablet (10 mg total) by mouth as needed. 30 tablet 2  . Glucosamine HCl 1000 MG TABS Take 2 tablets by mouth daily.    Marland Kitchen levothyroxine (SYNTHROID, LEVOTHROID) 150 MCG tablet Take 1 tablet (150 mcg total) by mouth daily before breakfast. 90 tablet 3  . Multiple Vitamin (MULTIVITAMIN) capsule Take 1 capsule by mouth daily.     . naproxen sodium (ALEVE) 220 MG tablet Take 220 mg by mouth as needed.    Marland Kitchen omega-3 acid ethyl esters (LOVAZA) 1 g capsule Take 1 g by mouth daily.     . sildenafil (REVATIO) 20 MG tablet Use 5 tablets PO as needed 100 tablet 3  . cholestyramine (QUESTRAN) 4 g packet Take 1 packet (4 g total) by mouth 4 (four)  times daily for 30 days. 120 packet 0  . rosuvastatin (CRESTOR) 20 MG tablet TAKE ONE TABLET BY MOUTH DAILY (Patient not taking: Reported on 01/03/2019) 90 tablet 0   No current facility-administered medications for this visit.     Allergies as of 01/03/2019  . (No Known Allergies)    Family History  Problem Relation Age of Onset  . Alzheimer's disease Mother   . Arthritis Mother   . Dementia Mother   . Stroke Mother   . Heart disease Father   . Cancer Sister        uterine  . Hyperlipidemia Brother   . Stroke Brother   . Cancer Brother   . Heart disease Paternal Grandmother     Social History   Socioeconomic History  . Marital status: Married    Spouse name: Not on file  . Number of children: Not on file  . Years of education: Not on file  . Highest education level: Not on file  Occupational History  . Not on file  Social Needs  . Financial  resource strain: Not on file  . Food insecurity:    Worry: Not on file    Inability: Not on file  . Transportation needs:    Medical: Not on file    Non-medical: Not on file  Tobacco Use  . Smoking status: Never Smoker  . Smokeless tobacco: Never Used  Substance and Sexual Activity  . Alcohol use: Yes    Comment: socially  . Drug use: No  . Sexual activity: Yes  Lifestyle  . Physical activity:    Days per week: Not on file    Minutes per session: Not on file  . Stress: Not on file  Relationships  . Social connections:    Talks on phone: Not on file    Gets together: Not on file    Attends religious service: Not on file    Active member of club or organization: Not on file    Attends meetings of clubs or organizations: Not on file    Relationship status: Not on file  . Intimate partner violence:    Fear of current or ex partner: Not on file    Emotionally abused: Not on file    Physically abused: Not on file    Forced sexual activity: Not on file  Other Topics Concern  . Not on file  Social History Narrative    . Not on file    Review of Systems: 12 system ROS is negative except as noted above.  Filed Weights   01/03/19 1554  Weight: 245 lb (111.1 kg)    Physical Exam: Vital signs were reviewed. General:   Alert, well-nourished, pleasant and cooperative in NAD Head:  Normocephalic and atraumatic. Eyes:  Scleral icterus present.   Conjunctiva pink. Mouth:  No deformity or lesions.   Neck:  Supple; no thyromegaly. Lungs:  Clear throughout to auscultation.   No wheezes. Heart:  Regular rate and rhythm; no murmurs Abdomen:  Soft, nontender, normal bowel sounds. No rebound or guarding. Negative Murphy's sign. Liver edge is nontender and palpable 3 fingerbreaths below the costal margin in the midclavicular line. Spleen tip not palpable.  LAD: No inguinal or umbilical LAD Rectal:  Deferred  Msk:  Symmetrical without gross deformities. Extremities:  No gross deformities or edema. Neurologic:  Alert and  oriented x4;  grossly nonfocal Skin:  No rash or bruise. No spider angiomas or palmar erythema. No tattoos.  Psych:  Alert and cooperative. Normal mood and affect.   Naryiah Schley L. Tarri Glenn, MD, MPH [facility] 01/03/2019, 9:37 PM

## 2019-01-03 NOTE — Patient Instructions (Addendum)
Your provider has requested that you go to the basement level for lab work before leaving today. Press "B" on the elevator. The lab is located at the first door on the left as you exit the elevator.  We have sent the following medications to your pharmacy for you to pick up at your convenience:  Cholestyramine 4 mg four times a day.   Avoid alcohol.

## 2019-01-04 ENCOUNTER — Other Ambulatory Visit: Payer: Medicare Other

## 2019-01-04 ENCOUNTER — Other Ambulatory Visit: Payer: Self-pay

## 2019-01-04 DIAGNOSIS — R932 Abnormal findings on diagnostic imaging of liver and biliary tract: Secondary | ICD-10-CM

## 2019-01-04 DIAGNOSIS — R748 Abnormal levels of other serum enzymes: Secondary | ICD-10-CM

## 2019-01-04 DIAGNOSIS — B159 Hepatitis A without hepatic coma: Secondary | ICD-10-CM

## 2019-01-04 NOTE — Progress Notes (Signed)
Pt

## 2019-01-09 LAB — HEPATITIS B VIRUS DNA, QUANTITATIVE, PCR WITH REFLEX TO HBV,E
Hepatitis B DNA (Calc): <1 Log IU/mL
Hepatitis B DNA: <10 IU/mL

## 2019-01-10 ENCOUNTER — Other Ambulatory Visit (INDEPENDENT_AMBULATORY_CARE_PROVIDER_SITE_OTHER): Payer: Medicare Other

## 2019-01-10 DIAGNOSIS — R932 Abnormal findings on diagnostic imaging of liver and biliary tract: Secondary | ICD-10-CM

## 2019-01-10 DIAGNOSIS — R748 Abnormal levels of other serum enzymes: Secondary | ICD-10-CM

## 2019-01-10 DIAGNOSIS — B159 Hepatitis A without hepatic coma: Secondary | ICD-10-CM | POA: Diagnosis not present

## 2019-01-10 LAB — HEPATIC FUNCTION PANEL
ALK PHOS: 198 U/L — AB (ref 39–117)
ALT: 820 U/L — ABNORMAL HIGH (ref 0–53)
AST: 1035 U/L — ABNORMAL HIGH (ref 0–37)
Albumin: 3.4 g/dL — ABNORMAL LOW (ref 3.5–5.2)
Bilirubin, Direct: 9 mg/dL — ABNORMAL HIGH (ref 0.0–0.3)
Total Bilirubin: 16.7 mg/dL — ABNORMAL HIGH (ref 0.2–1.2)
Total Protein: 6.9 g/dL (ref 6.0–8.3)

## 2019-01-10 LAB — PROTIME-INR
INR: 1.4 ratio — ABNORMAL HIGH (ref 0.8–1.0)
Prothrombin Time: 16.1 s — ABNORMAL HIGH (ref 9.6–13.1)

## 2019-01-10 LAB — BILIRUBIN, DIRECT: Bilirubin, Direct: 9 mg/dL — ABNORMAL HIGH (ref 0.0–0.3)

## 2019-01-11 ENCOUNTER — Ambulatory Visit (INDEPENDENT_AMBULATORY_CARE_PROVIDER_SITE_OTHER): Payer: Medicare Other | Admitting: Family Medicine

## 2019-01-11 ENCOUNTER — Other Ambulatory Visit: Payer: Self-pay

## 2019-01-11 ENCOUNTER — Ambulatory Visit (HOSPITAL_COMMUNITY)
Admission: RE | Admit: 2019-01-11 | Discharge: 2019-01-11 | Disposition: A | Discharge status: Home / Self Care | Payer: Medicare Other | Source: Ambulatory Visit | Attending: Gastroenterology | Admitting: Gastroenterology

## 2019-01-11 ENCOUNTER — Other Ambulatory Visit: Payer: Self-pay | Admitting: Gastroenterology

## 2019-01-11 ENCOUNTER — Telehealth: Payer: Self-pay | Admitting: Gastroenterology

## 2019-01-11 ENCOUNTER — Encounter: Payer: Self-pay | Admitting: Family Medicine

## 2019-01-11 ENCOUNTER — Other Ambulatory Visit (INDEPENDENT_AMBULATORY_CARE_PROVIDER_SITE_OTHER): Payer: Medicare Other

## 2019-01-11 VITALS — BP 104/68 | HR 75 | Temp 99.2°F | Resp 16 | Ht 73.0 in | Wt 235.6 lb

## 2019-01-11 DIAGNOSIS — R748 Abnormal levels of other serum enzymes: Secondary | ICD-10-CM | POA: Diagnosis present

## 2019-01-11 DIAGNOSIS — E871 Hypo-osmolality and hyponatremia: Secondary | ICD-10-CM

## 2019-01-11 DIAGNOSIS — B159 Hepatitis A without hepatic coma: Secondary | ICD-10-CM

## 2019-01-11 DIAGNOSIS — R932 Abnormal findings on diagnostic imaging of liver and biliary tract: Secondary | ICD-10-CM

## 2019-01-11 LAB — BASIC METABOLIC PANEL
BUN: 12 mg/dL (ref 6–23)
CO2: 18 mEq/L — ABNORMAL LOW (ref 19–32)
Calcium: 9.1 mg/dL (ref 8.4–10.5)
Chloride: 100 mEq/L (ref 96–112)
Creatinine, Ser: 0.99 mg/dL (ref 0.40–1.50)
GFR: 74.01 mL/min (ref >60.00)
Glucose, Bld: 77 mg/dL (ref 70–99)
Potassium: 4.2 mEq/L (ref 3.5–5.1)
Sodium: 129 mEq/L — ABNORMAL LOW (ref 135–145)

## 2019-01-11 MED ORDER — GADOBUTROL 1 MMOL/ML IV SOLN
10.0000 mL | Freq: Once | INTRAVENOUS | Status: AC | PRN
  Administered 2019-01-11: 10 mL via INTRAVENOUS

## 2019-01-11 NOTE — Progress Notes (Signed)
Subjective  CC:  Chief Complaint  Patient presents with  . Follow-up  . Acute Hepatitis    HPI: Timothy Grant is a 72 y.o. male who presents to the office today to address the problems listed above in the chief complaint.  73 yo with acute hep A and elevated blilirubinemia: had lab work yesterday and abdominal MRI today per GI. Feels well.   Appetite has been down and weight is down. Drinking 3-4 24 oz cups of water + gatorade and tea daily. Denies LE edema, swelling, sob, orthopnea. Sodium level has been 129. No HCTZ. No h/o low sodium  Ros: denies RUQ pain, n/v or pain  Assessment  1. Acute hepatitis A   2. Abnormal gallbladder ultrasound   3. Hyponatremia      Plan   Hep a and abnl gallbladder:  Per GI. Worsening enzymes by lab review today  Hypnatremia, mild: hypotonic hypervolemia vs SIADH vs other. Check urine studies and recheck sodium. Limit fluid and follow.   Follow up: prn and  03/28/2019  Orders Placed This Encounter  Procedures  . Osmolality, urine  . Sodium, urine, random  . Basic metabolic panel   No orders of the defined types were placed in this encounter.     I reviewed the patients updated PMH, FH, and SocHx.    Patient Active Problem List   Diagnosis Date Noted  . Acquired hypothyroidism 02/05/2011    Priority: High  . Adenomatous colon polyp 02/05/2011    Priority: High  . Mixed hyperlipidemia 02/05/2011    Priority: High  . Osteoarthritis of finger of left hand 01/07/2017    Priority: Medium  . Leukopenia 08/09/2012    Priority: Medium  . Trigger finger, left ring finger 01/07/2017    Priority: Low  . Male erectile disorder 02/09/2012    Priority: Low   Current Meds  Medication Sig  . aspirin EC 81 MG tablet Take 162 mg by mouth daily.  . Cholecalciferol (VITAMIN D3) 5000 units TABS Take 1 tablet by mouth daily.   . cholestyramine (QUESTRAN) 4 g packet Take 1 packet (4 g total) by mouth 4 (four) times daily for 30 days.  .  cyclobenzaprine (FLEXERIL) 10 MG tablet Take 1 tablet (10 mg total) by mouth as needed.  . Glucosamine HCl 1000 MG TABS Take 2 tablets by mouth daily.  Marland Kitchen levothyroxine (SYNTHROID, LEVOTHROID) 150 MCG tablet Take 1 tablet (150 mcg total) by mouth daily before breakfast.  . Multiple Vitamin (MULTIVITAMIN) capsule Take 1 capsule by mouth daily.   . naproxen sodium (ALEVE) 220 MG tablet Take 220 mg by mouth as needed.  Marland Kitchen omega-3 acid ethyl esters (LOVAZA) 1 g capsule Take 1 g by mouth daily.   . sildenafil (REVATIO) 20 MG tablet Use 5 tablets PO as needed    Allergies: Patient has No Known Allergies. Family History: Patient family history includes Alzheimer's disease in his mother; Arthritis in his mother; Cancer in his brother and sister; Dementia in his mother; Heart disease in his father and paternal grandmother; Hyperlipidemia in his brother; Stroke in his brother and mother. Social History:  Patient  reports that he has never smoked. He has never used smokeless tobacco. He reports current alcohol use. He reports that he does not use drugs.  Review of Systems: Constitutional: Negative for fever malaise or anorexia Cardiovascular: negative for chest pain Respiratory: negative for SOB or persistent cough Gastrointestinal: negative for abdominal pain  Wt Readings from Last 3 Encounters:  01/11/19 235 lb 9.6 oz (106.9 kg)  01/03/19 245 lb (111.1 kg)  12/25/18 249 lb 2 oz (113 kg)   BP Readings from Last 3 Encounters:  01/11/19 104/68  01/03/19 130/84  12/25/18 122/82    Objective  Vitals: BP 104/68   Pulse 75   Temp 99.2 F (37.3 C) (Oral)   Resp 16   Ht 6\' 1"  (1.854 m)   Wt 235 lb 9.6 oz (106.9 kg)   SpO2 97%   BMI 31.08 kg/m  General: no acute distress , A&Ox3 HEENT: PEERL, conjunctiva normal, Oropharynx moist,neck is supple Cardiovascular:  RRR without murmur or gallop.  Respiratory:  Good breath sounds bilaterally, CTAB with normal respiratory effort Skin:  Warm, no  rashes, jaundice diffusely No ruq ttp or HSM. nontender abd. Soft, + bs.  No LE edema.  Lab Results  Component Value Date   HEPAIGM REACTIVE (A) 12/25/2018   HEPBIGM NON-REACTIVE 12/25/2018   HEPCAB NON-REACTIVE 12/25/2018   Lab Results  Component Value Date   ALT 820 (H) 01/10/2019   AST 1,035 (H) 01/10/2019   ALKPHOS 198 (H) 01/10/2019   BILITOT 16.7 (H) 01/10/2019   Lab Results  Component Value Date   CREATININE 0.96 01/03/2019   BUN 12 01/03/2019   NA 129 (L) 01/03/2019   K 3.9 01/03/2019   CL 99 01/03/2019   CO2 25 01/03/2019      Commons side effects, risks, benefits, and alternatives for medications and treatment plan prescribed today were discussed, and the patient expressed understanding of the given instructions. Patient is instructed to call or message via MyChart if he/she has any questions or concerns regarding our treatment plan. No barriers to understanding were identified. We discussed Red Flag symptoms and signs in detail. Patient expressed understanding regarding what to do in case of urgent or emergency type symptoms.   Medication list was reconciled, printed and provided to the patient in AVS. Patient instructions and summary information was reviewed with the patient as documented in the AVS. This note was prepared with assistance of Dragon voice recognition software. Occasional wrong-word or sound-a-like substitutions may have occurred due to the inherent limitations of voice recognition software

## 2019-01-11 NOTE — Telephone Encounter (Signed)
Pt call about lab results

## 2019-01-11 NOTE — Telephone Encounter (Signed)
Patient notified that the labs are still pending and we will call as soon as we have the results.

## 2019-01-11 NOTE — Patient Instructions (Signed)
Your gastroenterologist will follow up with you on your lab results regarding your liver tests and MRI.   I will let you know about your sodium results.  For now, please limit your fluid intake to 1557ml/ day.  You may eat high sodium foods including soups, canned foods, and you may salt your foods.

## 2019-01-12 ENCOUNTER — Telehealth: Payer: Self-pay | Admitting: *Deleted

## 2019-01-12 ENCOUNTER — Other Ambulatory Visit: Payer: Self-pay

## 2019-01-12 DIAGNOSIS — R748 Abnormal levels of other serum enzymes: Secondary | ICD-10-CM

## 2019-01-12 LAB — HCV RNA NAA QUAL RFX TO QUANT: HCV RNA NAA Qualitative: NEGATIVE

## 2019-01-12 LAB — EPSTEIN BARR VRS(EBV DNA BY PCR)
Epstein-Barr DNA Quant, PCR: 184 copies/mL
log10 EBV DNA Qn PCR: 2.265 log10 copy/mL

## 2019-01-12 MED ORDER — PHYTONADIONE 5 MG PO TABS: 10.0000 mg | ORAL_TABLET | Freq: Every day | ORAL | 0 refills | Status: AC

## 2019-01-12 NOTE — Telephone Encounter (Signed)
Dr. Jonni Sanger was able to speak with patient.  Copied from [place] [id number]. Topic: General - Other >> Jan 12, 2019 11:57 AM Jodie Echevaria wrote: Reason for CRM: Patient called to say that he need to speak with Dr Jonni Sanger today regarding an ongoing Hepatitis problem. Per patient it is imperative that Dr Jonni Sanger call him back today first chance she get. Please advise   Ph#  [phone]

## 2019-01-13 ENCOUNTER — Other Ambulatory Visit: Payer: Self-pay | Admitting: Family Medicine

## 2019-01-13 LAB — OSMOLALITY, URINE: Osmolality, Ur: 725 mOsm/kg (ref 50–1200)

## 2019-01-13 LAB — EXTRA URINE SPECIMEN

## 2019-01-13 LAB — SODIUM, URINE, RANDOM: Sodium, Ur: 18 mmol/L — ABNORMAL LOW (ref 28–272)

## 2019-01-14 LAB — IGG: IgG (Immunoglobin G), Serum: 1725 mg/dL — ABNORMAL HIGH (ref 600–1540)

## 2019-01-14 LAB — HIV ANTIBODY (ROUTINE TESTING W REFLEX): HIV 1&2 Ab, 4th Generation: NONREACTIVE

## 2019-01-14 LAB — ANA: Anti Nuclear Antibody(ANA): NEGATIVE

## 2019-01-14 LAB — ANTI-SMOOTH MUSCLE ANTIBODY, IGG: Actin (Smooth Muscle) Antibody (IGG): 21 U — ABNORMAL HIGH (ref <20)

## 2019-01-15 ENCOUNTER — Other Ambulatory Visit (INDEPENDENT_AMBULATORY_CARE_PROVIDER_SITE_OTHER): Payer: Medicare Other

## 2019-01-15 ENCOUNTER — Other Ambulatory Visit: Payer: Self-pay | Admitting: Emergency Medicine

## 2019-01-15 DIAGNOSIS — R748 Abnormal levels of other serum enzymes: Secondary | ICD-10-CM | POA: Diagnosis not present

## 2019-01-15 DIAGNOSIS — B159 Hepatitis A without hepatic coma: Secondary | ICD-10-CM

## 2019-01-15 DIAGNOSIS — R932 Abnormal findings on diagnostic imaging of liver and biliary tract: Secondary | ICD-10-CM | POA: Diagnosis not present

## 2019-01-15 LAB — HEPATIC FUNCTION PANEL
ALT: 944 U/L — ABNORMAL HIGH (ref 0–53)
AST: 1155 U/L — ABNORMAL HIGH (ref 0–37)
Albumin: 3.1 g/dL — ABNORMAL LOW (ref 3.5–5.2)
Alkaline Phosphatase: 186 U/L — ABNORMAL HIGH (ref 39–117)
Bilirubin, Direct: 9.9 mg/dL — ABNORMAL HIGH (ref 0.0–0.3)
Total Bilirubin: 18.7 mg/dL — ABNORMAL HIGH (ref 0.2–1.2)
Total Protein: 6.4 g/dL (ref 6.0–8.3)

## 2019-01-15 LAB — PROTIME-INR
INR: 1.6 ratio — ABNORMAL HIGH (ref 0.8–1.0)
PROTHROMBIN TIME: 19.1 s — AB (ref 9.6–13.1)

## 2019-01-15 LAB — BILIRUBIN, DIRECT: Bilirubin, Direct: 9.9 mg/dL — ABNORMAL HIGH (ref 0.0–0.3)

## 2019-01-15 LAB — SODIUM: Sodium: 131 mEq/L — ABNORMAL LOW (ref 135–145)

## 2019-01-17 ENCOUNTER — Other Ambulatory Visit: Payer: Self-pay | Admitting: Family Medicine

## 2019-01-19 ENCOUNTER — Telehealth: Payer: Self-pay | Admitting: Gastroenterology

## 2019-01-19 DIAGNOSIS — B159 Hepatitis A without hepatic coma: Secondary | ICD-10-CM

## 2019-01-19 NOTE — Telephone Encounter (Signed)
Patient notified Weekly labs - standing orders placed for the next 6 weeks

## 2019-01-19 NOTE — Telephone Encounter (Signed)
Pt would like to speak with Dr. Tarri Glenn regarding procedure that he is scheduled for next Friday.

## 2019-01-19 NOTE — Telephone Encounter (Signed)
I would not expect that at this point in time.  He should return for weekly hepatitis panel and PT/INR on Monday. Thank you.

## 2019-01-19 NOTE — Telephone Encounter (Signed)
Patient wants to know if he is "actively shedding Hepatitis A viral cells"? Other questions about the liver bx answered.

## 2019-01-22 ENCOUNTER — Other Ambulatory Visit (INDEPENDENT_AMBULATORY_CARE_PROVIDER_SITE_OTHER): Payer: Medicare Other

## 2019-01-22 DIAGNOSIS — B159 Hepatitis A without hepatic coma: Secondary | ICD-10-CM

## 2019-01-22 DIAGNOSIS — R748 Abnormal levels of other serum enzymes: Secondary | ICD-10-CM

## 2019-01-22 DIAGNOSIS — R932 Abnormal findings on diagnostic imaging of liver and biliary tract: Secondary | ICD-10-CM

## 2019-01-22 LAB — BILIRUBIN, DIRECT: Bilirubin, Direct: 11.5 mg/dL — ABNORMAL HIGH (ref 0.0–0.3)

## 2019-01-22 LAB — HEPATIC FUNCTION PANEL
ALK PHOS: 164 U/L — AB (ref 39–117)
ALT: 1102 U/L — ABNORMAL HIGH (ref 0–53)
AST: 1370 U/L — ABNORMAL HIGH (ref 0–37)
Albumin: 2.8 g/dL — ABNORMAL LOW (ref 3.5–5.2)
BILIRUBIN TOTAL: 21.6 mg/dL — AB (ref 0.2–1.2)
Bilirubin, Direct: 11.5 mg/dL — ABNORMAL HIGH (ref 0.0–0.3)
Total Protein: 6.2 g/dL (ref 6.0–8.3)

## 2019-01-22 LAB — PROTIME-INR
INR: 1.8 ratio — ABNORMAL HIGH (ref 0.8–1.0)
Prothrombin Time: 21.3 s — ABNORMAL HIGH (ref 9.6–13.1)

## 2019-01-24 MED ORDER — ONDANSETRON 4 MG PO TBDP
4.00 | ORAL_TABLET | ORAL | Status: DC
Start: ? — End: 2019-01-24

## 2019-01-24 MED ORDER — MELATONIN 3 MG PO TABS
3.00 | ORAL_TABLET | ORAL | Status: DC
Start: ? — End: 2019-01-24

## 2019-01-24 MED ORDER — GENERIC EXTERNAL MEDICATION
10.00 | Status: DC
Start: 2019-01-24 — End: 2019-01-24

## 2019-01-24 MED ORDER — BUTALBITAL-APAP-CAFFEINE 50-325-40 MG PO TABS
1.00 | ORAL_TABLET | ORAL | Status: DC
Start: ? — End: 2019-01-24

## 2019-01-24 MED ORDER — GENERIC EXTERNAL MEDICATION
Status: DC
Start: ? — End: 2019-01-24

## 2019-01-24 MED ORDER — LEVOTHYROXINE SODIUM 150 MCG PO TABS
150.00 | ORAL_TABLET | ORAL | Status: DC
Start: 2019-01-25 — End: 2019-01-24

## 2019-01-26 ENCOUNTER — Ambulatory Visit (HOSPITAL_COMMUNITY): Payer: Medicare Other

## 2019-01-30 ENCOUNTER — Other Ambulatory Visit (INDEPENDENT_AMBULATORY_CARE_PROVIDER_SITE_OTHER): Payer: Medicare Other

## 2019-01-30 DIAGNOSIS — R748 Abnormal levels of other serum enzymes: Secondary | ICD-10-CM

## 2019-01-30 DIAGNOSIS — B159 Hepatitis A without hepatic coma: Secondary | ICD-10-CM

## 2019-01-30 DIAGNOSIS — R932 Abnormal findings on diagnostic imaging of liver and biliary tract: Secondary | ICD-10-CM

## 2019-01-30 LAB — PROTIME-INR
INR: 1.8 ratio — ABNORMAL HIGH (ref 0.8–1.0)
Prothrombin Time: 20.5 s — ABNORMAL HIGH (ref 9.6–13.1)

## 2019-01-30 LAB — HEPATIC FUNCTION PANEL
ALT: 600 U/L — ABNORMAL HIGH (ref 0–53)
AST: 943 U/L — ABNORMAL HIGH (ref 0–37)
Albumin: 2.6 g/dL — ABNORMAL LOW (ref 3.5–5.2)
Alkaline Phosphatase: 181 U/L — ABNORMAL HIGH (ref 39–117)
BILIRUBIN DIRECT: 11.5 mg/dL — AB (ref 0.0–0.3)
Total Bilirubin: 22.2 mg/dL — ABNORMAL HIGH (ref 0.2–1.2)
Total Protein: 6.3 g/dL (ref 6.0–8.3)

## 2019-01-30 LAB — BILIRUBIN, DIRECT: Bilirubin, Direct: 11.6 mg/dL — ABNORMAL HIGH (ref 0.0–0.3)

## 2019-01-31 ENCOUNTER — Ambulatory Visit: Payer: Medicare Other | Admitting: Gastroenterology

## 2019-01-31 ENCOUNTER — Other Ambulatory Visit (INDEPENDENT_AMBULATORY_CARE_PROVIDER_SITE_OTHER): Payer: Medicare Other

## 2019-01-31 ENCOUNTER — Encounter: Payer: Self-pay | Admitting: Gastroenterology

## 2019-01-31 VITALS — BP 90/60 | HR 88 | Ht 72.75 in | Wt 242.1 lb

## 2019-01-31 DIAGNOSIS — B159 Hepatitis A without hepatic coma: Secondary | ICD-10-CM

## 2019-01-31 DIAGNOSIS — K769 Liver disease, unspecified: Secondary | ICD-10-CM

## 2019-01-31 DIAGNOSIS — D689 Coagulation defect, unspecified: Secondary | ICD-10-CM | POA: Diagnosis not present

## 2019-01-31 DIAGNOSIS — E871 Hypo-osmolality and hyponatremia: Secondary | ICD-10-CM | POA: Diagnosis not present

## 2019-01-31 LAB — PROTIME-INR
INR: 1.8 ratio — ABNORMAL HIGH (ref 0.8–1.0)
Prothrombin Time: 21.3 s — ABNORMAL HIGH (ref 9.6–13.1)

## 2019-01-31 LAB — COMPREHENSIVE METABOLIC PANEL
ALK PHOS: 189 U/L — AB (ref 39–117)
ALT: 558 U/L — ABNORMAL HIGH (ref 0–53)
AST: 877 U/L — ABNORMAL HIGH (ref 0–37)
Albumin: 2.6 g/dL — ABNORMAL LOW (ref 3.5–5.2)
BUN: 11 mg/dL (ref 6–23)
CALCIUM: 8.6 mg/dL (ref 8.4–10.5)
CO2: 23 mEq/L (ref 19–32)
Chloride: 101 mEq/L (ref 96–112)
Creatinine, Ser: 1.12 mg/dL (ref 0.40–1.50)
GFR: 64.18 mL/min (ref >60.00)
Glucose, Bld: 107 mg/dL — ABNORMAL HIGH (ref 70–99)
Potassium: 4 mEq/L (ref 3.5–5.1)
Sodium: 132 mEq/L — ABNORMAL LOW (ref 135–145)
Total Bilirubin: 21.7 mg/dL — ABNORMAL HIGH (ref 0.2–1.2)
Total Protein: 6.3 g/dL (ref 6.0–8.3)

## 2019-01-31 LAB — BILIRUBIN, DIRECT: Bilirubin, Direct: 14.3 mg/dL — ABNORMAL HIGH (ref 0.0–0.3)

## 2019-01-31 NOTE — Patient Instructions (Addendum)
Your provider has requested that you go to the basement level for lab work before leaving today. Press "B" on the elevator. The lab is located at the first door on the left as you exit the elevator.  If you are age 74 or older, your body mass index should be between 23-30. Your Body mass index is 32.16 kg/m. If this is out of the aforementioned range listed, please consider follow up with your Primary Care Provider.  You we will then need to  return for weekly labs as directed starting on Monday February 05, 2019. CMP, direct bilirubin, PT/INR.   We will see you back in office in 2 weeks.   We will request records from Dr. Manuella Ghazi.   Thank you for choosing me and [facility].  Dr. Tarri Glenn

## 2019-01-31 NOTE — Progress Notes (Signed)
Referring Provider: [name], MD Primary Care Physician:  [name], MD Hepatologist: Dorcas Mcmurray  Reason for Consultation: Acute on chronic liver disease   IMPRESSION:  Cholestatic, relapsing hepatitis A Hyponatremia, likely SIADH    - no evidence for cirrhosis or portal hypertension to suggest Type II HRS pathophysiology Progressive direct hyperbilirubinemia Persistent coagulopathy despite vitamin K replacement as an outpatient and inpatient and NAC Abnormal transaminases    - normal early 2019     - acute elevation noted in 12/2018 Newly developed pitting edema Abnormal gallbladder ultrasound 12/29/2018    - echogenic liver    - diffusely thickened and edematous gallbladder, no pericholecystic fluid    - negative sonographic Murphy sign, no biliary dilatation Hypothyroidism  Timothy Grant was hospitalized at [facility] for 4 days last week given his progressive hyperbilirubinemia, hyponatremia, and progressive coagulopathy.  Unfortunately do not have any records from his recent hospitalization to know if a new and or concurrent etiology was identified.  Remained concerned given his persistent hyperbilirubinemia and coagulopathy.  Although his transaminases are improving this could reflect some underlying burnout. We will continue with weekly labs.  I will obtain the records from Dr. Brigitte Grant at [facility].  Should continue on his sodium and fluid restriction at this time.  I do not have a good explanation for his new onset pitting edema.  I suspect that an echocardiogram was performed at [facility].  Will await those results.  In the meantime I recommend that he follow-up with Dr. Jonni Sanger.   PLAN: Obtain recent records from [facility] - Dr. Manuella Ghazi CMP today CMP, direct bilirubin, PT/INR every Monday Please send labs from yesterday to Dr. Dorcas Mcmurray at [place] with Dr. 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  The interval history is obtained through the patient and review of his electronic health record.  Unfortunately, I have no records from [facility].  None of his recent hospitalization is currently available through [facility].  Was hospitalized at [facility] for progressive coagulopathy and hyperbilirubinemia.  An inpatient transplant evaluation was started.  A liver biopsy was performed. He reports stable labs after treatment with vitamin K and N-acetylcysteine.  Timothy Grant understands that he is unlikely to need a liver transplant.  Dr. Manuella Ghazi told him he cautious optimistic that things might improve. Timothy Grant had extensive testing done including a liver biopsy. Overall, he is feeling pretty good.  No further itching.  He is very frustrated by being dry on his 1200 cc fluid restricted diet.  Declined any alternatives to cholestyramine for pruritus.   Patient follow-up with Dr. Brigitte Grant at [facility] 02/21/19.  Today his primary concerns are only like his abdominal wall is tender.  Annoyance by frequent belching.  He was told to eat multiple small meals on discharge.  Tums at night controls these symptoms but he continues to have problems with it during the day.   Shortness of breath, cough, chest pain.  No prior history of peripheral edema.  He is strictly following a 1200 mL fluid restricted diet and a 2 g sodium restricted diet.  Not taking the cholestyramine given the volume of fluid required.  Timothy Grant is frustrated that the labs performed yesterday did not include the labs that Dr. Manuella Ghazi needed.  10 years denies use of any  alcohol.  He denies the use of any new medications.  He is frustrated that I have not communicated with Dr. Manuella Ghazi.  He also appears confused and scared that I have identified pitting edema on his exam today.  He was not aware of these findings and denies any prior history of  edema.  Labs from 01/31/2019 show a sodium of 132, potassium 4.0, BUN 11, creatinine 1.12, albumin 2.6, AST 2877, ALT 558, total bilirubin 21.7, direct bilirubin 14.3, INR 1.8.  Reviewed care everywhere and the only data available to me is the procedure report from his liver biopsy.  I have personally reviewed those results showing ultrasound-guided biopsy of 2 core biopsies/20/20.  The pathology results are not visible.   Past Medical History:  Diagnosis Date  . Adenomatous polyps   . ED (erectile dysfunction)   . Hyperlipidemia   . Hypothyroidism   . Varicocele 02/01/2011   Left testicle    Past Surgical History:  Procedure Laterality Date  . EYE SURGERY Right 1956  . EYE SURGERY Left 1963  . TONSILLECTOMY      Current Outpatient Medications  Medication Sig Dispense Refill  . acetaminophen (TYLENOL) 325 MG tablet Take 650 mg by mouth as needed.    Marland Kitchen aspirin EC 81 MG tablet Take 162 mg by mouth daily.    . Cholecalciferol (VITAMIN D3) 5000 units TABS Take 1 tablet by mouth daily.     . cyclobenzaprine (FLEXERIL) 10 MG tablet Take 1 tablet (10 mg total) by mouth as needed. 30 tablet 2  . folic acid (FOLVITE) 1 MG tablet Take 1 tablet by mouth daily.    . Glucosamine HCl 1000 MG TABS Take 2 tablets by mouth daily.    Marland Kitchen levothyroxine (SYNTHROID, LEVOTHROID) 150 MCG tablet Take 1 tablet (150 mcg total) by mouth daily before breakfast. 90 tablet 3  . Multiple Vitamin (MULTIVITAMIN) capsule Take 1 capsule by mouth daily.     . Omega-3 1000 MG CAPS Take 1 tablet by mouth daily.    . rosuvastatin (CRESTOR) 20 MG tablet TAKE ONE TABLET BY MOUTH DAILY 90 tablet 0  . sildenafil (REVATIO) 20 MG tablet Use 5 tablets PO as needed 100 tablet 3   No current facility-administered medications for this visit.     Allergies as of 01/31/2019  . (No Known Allergies)    Family History  Problem Relation Age of Onset  . Alzheimer's disease Mother   . Arthritis Mother   . Dementia Mother   .  Stroke Mother   . Heart disease Father   . Cancer Sister        uterine  . Hyperlipidemia Brother   . Stroke Brother   . Cancer Brother   . Heart disease Paternal Grandmother     Social History   Socioeconomic History  . Marital status: Married    Spouse name: Not on file  . Number of children: Not on file  . Years of education: Not on file  . Highest education level: Not on file  Occupational History  . Not on file  Social Needs  . Financial resource strain: Not on file  . Food insecurity:    Worry: Not on file    Inability: Not on file  . Transportation needs:    Medical: Not on file    Non-medical: Not on file  Tobacco Use  . Smoking status: Never Smoker  . Smokeless tobacco: Never Used  Substance and Sexual Activity  . Alcohol use: Yes  Comment: socially  . Drug use: No  . Sexual activity: Yes  Lifestyle  . Physical activity:    Days per week: Not on file    Minutes per session: Not on file  . Stress: Not on file  Relationships  . Social connections:    Talks on phone: Not on file    Gets together: Not on file    Attends religious service: Not on file    Active member of club or organization: Not on file    Attends meetings of clubs or organizations: Not on file    Relationship status: Not on file  . Intimate partner violence:    Fear of current or ex partner: Not on file    Emotionally abused: Not on file    Physically abused: Not on file    Forced sexual activity: Not on file  Other Topics Concern  . Not on file  Social History Narrative  . Not on file   Filed Weights   01/31/19 1025  Weight: 242 lb 2 oz (109.8 kg)    Physical Exam: Vital signs were reviewed. General:   Alert, well-nourished, pleasant and cooperative in NAD.  Jaundice. Head:  Normocephalic and atraumatic. Eyes:  Scleral icterus present.   Conjunctiva pink. Mouth:  No deformity or lesions.   Neck:  Supple; no thyromegaly. Lungs:  Clear throughout to auscultation.   No  wheezes. Heart:  Regular rate and rhythm; no murmurs. Trace lateral pitting LE edema.  Abdomen:  Soft, nontender, normal bowel sounds. No rebound or guarding. Negative Murphy's sign. Liver edge is nontender and palpable 3 fingerbreaths below the costal margin in the midclavicular line. Spleen tip not palpable.  LAD: No inguinal or umbilical LAD Rectal:  Deferred  Msk:  Symmetrical without gross deformities. Extremities:  No gross deformities or edema. Neurologic:  Alert and  oriented x4;  grossly nonfocal.  No asterixis or clonus. Skin:  No rash or bruise. No spider angiomas or palmar erythema. No tattoos.  Psych:  Alert and cooperative, frustrated. Normal mood and affect.   Danyon Mcginness L. Tarri Glenn, MD, MPH [facility] 01/31/2019, 10:30 AM

## 2019-02-01 ENCOUNTER — Encounter: Payer: Self-pay | Admitting: Gastroenterology

## 2019-02-01 ENCOUNTER — Other Ambulatory Visit: Payer: Self-pay

## 2019-02-01 DIAGNOSIS — B159 Hepatitis A without hepatic coma: Secondary | ICD-10-CM

## 2019-02-05 ENCOUNTER — Other Ambulatory Visit (INDEPENDENT_AMBULATORY_CARE_PROVIDER_SITE_OTHER): Payer: Medicare Other

## 2019-02-05 ENCOUNTER — Telehealth: Payer: Self-pay | Admitting: Gastroenterology

## 2019-02-05 DIAGNOSIS — R932 Abnormal findings on diagnostic imaging of liver and biliary tract: Secondary | ICD-10-CM

## 2019-02-05 DIAGNOSIS — B159 Hepatitis A without hepatic coma: Secondary | ICD-10-CM | POA: Diagnosis not present

## 2019-02-05 DIAGNOSIS — R748 Abnormal levels of other serum enzymes: Secondary | ICD-10-CM | POA: Diagnosis not present

## 2019-02-05 LAB — CBC WITH DIFFERENTIAL/PLATELET
Basophils Absolute: 0 10*3/uL (ref 0.0–0.1)
Basophils Relative: 1.3 % (ref 0.0–3.0)
EOS ABS: 0.1 10*3/uL (ref 0.0–0.7)
Eosinophils Relative: 2 % (ref 0.0–5.0)
HCT: 32.4 % — ABNORMAL LOW (ref 39.0–52.0)
Hemoglobin: 11.5 g/dL — ABNORMAL LOW (ref 13.0–17.0)
Lymphocytes Relative: 13.8 % (ref 12.0–46.0)
Lymphs Abs: 0.4 10*3/uL — ABNORMAL LOW (ref 0.7–4.0)
MCHC: 35.4 g/dL (ref 30.0–36.0)
MCV: 100.5 fl — ABNORMAL HIGH (ref 78.0–100.0)
MONO ABS: 0.4 10*3/uL (ref 0.1–1.0)
Monocytes Relative: 14.3 % — ABNORMAL HIGH (ref 3.0–12.0)
NEUTROS PCT: 68.6 % (ref 43.0–77.0)
Neutro Abs: 2 10*3/uL (ref 1.4–7.7)
Platelets: 107 10*3/uL — ABNORMAL LOW (ref 150.0–400.0)
RBC: 3.22 Mil/uL — ABNORMAL LOW (ref 4.22–5.81)
RDW: 19.8 % — ABNORMAL HIGH (ref 11.5–15.5)
WBC: 3 10*3/uL — ABNORMAL LOW (ref 4.0–10.5)

## 2019-02-05 LAB — COMPREHENSIVE METABOLIC PANEL
ALT: 374 U/L — ABNORMAL HIGH (ref 0–53)
AST: 695 U/L — ABNORMAL HIGH (ref 0–37)
Albumin: 2.5 g/dL — ABNORMAL LOW (ref 3.5–5.2)
Alkaline Phosphatase: 248 U/L — ABNORMAL HIGH (ref 39–117)
BUN: 12 mg/dL (ref 6–23)
CO2: 22 mEq/L (ref 19–32)
Calcium: 8.2 mg/dL — ABNORMAL LOW (ref 8.4–10.5)
Chloride: 99 mEq/L (ref 96–112)
Creatinine, Ser: 1.21 mg/dL (ref 0.40–1.50)
GFR: 58.7 mL/min — ABNORMAL LOW (ref >60.00)
GLUCOSE: 128 mg/dL — AB (ref 70–99)
Potassium: 3.3 mEq/L — ABNORMAL LOW (ref 3.5–5.1)
Sodium: 128 mEq/L — ABNORMAL LOW (ref 135–145)
Total Bilirubin: 18.2 mg/dL — ABNORMAL HIGH (ref 0.2–1.2)
Total Protein: 6.2 g/dL (ref 6.0–8.3)

## 2019-02-05 LAB — PROTIME-INR
INR: 1.8 ratio — ABNORMAL HIGH (ref 0.8–1.0)
PROTHROMBIN TIME: 20.5 s — AB (ref 9.6–13.1)

## 2019-02-05 NOTE — Telephone Encounter (Signed)
PT called and stated when he went to get blood work the INR test was not ordered. He was wanting to make sure that it was not overlooked and not ordered. He did advised that the lab drew an extra tube of blood just incase and you can call to order the test.

## 2019-02-05 NOTE — Telephone Encounter (Signed)
I spoke with the lab.  The orders are in for standing for INR, CBC, and CMET.  They will run INR. Patient notified

## 2019-02-06 ENCOUNTER — Encounter: Payer: Self-pay | Admitting: Family Medicine

## 2019-02-06 ENCOUNTER — Other Ambulatory Visit: Payer: Self-pay

## 2019-02-06 ENCOUNTER — Ambulatory Visit (INDEPENDENT_AMBULATORY_CARE_PROVIDER_SITE_OTHER): Payer: Medicare Other | Admitting: Family Medicine

## 2019-02-06 VITALS — BP 110/60 | HR 102 | Temp 97.9°F | Resp 20 | Ht 72.75 in | Wt 240.6 lb

## 2019-02-06 DIAGNOSIS — E871 Hypo-osmolality and hyponatremia: Secondary | ICD-10-CM | POA: Diagnosis not present

## 2019-02-06 DIAGNOSIS — B159 Hepatitis A without hepatic coma: Secondary | ICD-10-CM

## 2019-02-06 DIAGNOSIS — E222 Syndrome of inappropriate secretion of antidiuretic hormone: Secondary | ICD-10-CM | POA: Diagnosis not present

## 2019-02-06 DIAGNOSIS — R600 Localized edema: Secondary | ICD-10-CM | POA: Diagnosis not present

## 2019-02-06 MED ORDER — SODIUM CHLORIDE 1 G PO TABS: 1.0000 g | ORAL_TABLET | Freq: Every day | ORAL | Status: DC

## 2019-02-06 MED ORDER — POTASSIUM CHLORIDE ER 20 MEQ PO TBCR: 20.0000 meq | EXTENDED_RELEASE_TABLET | Freq: Two times a day (BID) | ORAL | 0 refills | Status: DC

## 2019-02-06 NOTE — Progress Notes (Signed)
Subjective  CC:  Chief Complaint  Patient presents with  . Joint Swelling    Bilateral, started around 2/26.. Dr. Modena Nunnery noticed and referred him back to PCP    HPI: Timothy Grant is a 73 y.o. male who presents to the office today to address the problems listed above in the chief complaint.  73 year old male with acute hepatitis with persistent liver insufficiency here to follow-up on hyponatremia and lower extremity edema.  Was recently hospitalized at [facility] for liver biopsy and work-up for autoimmune hepatitis in addition to hepatitis a.  Fortunately, his liver biopsy was negative for other problems.  Work-up for autoimmune hepatitis was negative.  Recent lab work, reviewed from yesterday, reveals improving liver enzymes.  He remains mildly hyperanticoagulated.  Albumin remains low.  Sodium remains low.  Work-up revealed SIADH.  Since being discharged from the hospital he was told he could liberalize his fluid intake and he is done so.  He denies shortness of breath, orthopnea, dyspnea on exertion.  He admits to mild lower extremity edema that is not painful.  There is no redness or rashes.  No calf pain.  I reviewed extensive records from GI.  Assessment  1. Acute hepatitis A   2. SIADH (syndrome of inappropriate ADH production) ([place])   3. Hyponatremia   4. Bilateral lower extremity edema      Plan   Hepatitis A with associated SIADH, hyponatremia and lower extremity edema: Extensive counseling and education given.  Hyponatremia is mildly worse due to increasing fluid intake.  Recommend restricting fluid intake to 1 L to 1.5 L/day.  Add oral salt tablet once daily.  Will monitor sodium levels weekly.  Goal sodium around 130.  No other work-up needed at this time.  Lower extremity edema related to hypo-albumin anemia related to liver insufficiency.  Will follow.  No need for diuretic at this time.  If sodium does not improve we will add Lasix.  Recent labs show hypokalemia.  GI has  ordered supplementation.  Will recheck next week.  Follow up: Return if symptoms worsen or fail to improve.  03/28/2019  No orders of the defined types were placed in this encounter.  Meds ordered this encounter  Medications  . sodium chloride 1 g tablet    Sig: Take 1 tablet (1 g total) by mouth daily.      I reviewed the patients updated PMH, FH, and SocHx.    Patient Active Problem List   Diagnosis Date Noted  . Acquired hypothyroidism 02/05/2011    Priority: High  . Adenomatous colon polyp 02/05/2011    Priority: High  . Mixed hyperlipidemia 02/05/2011    Priority: High  . Osteoarthritis of finger of left hand 01/07/2017    Priority: Medium  . Leukopenia 08/09/2012    Priority: Medium  . Trigger finger, left ring finger 01/07/2017    Priority: Low  . Male erectile disorder 02/09/2012    Priority: Low   Current Meds  Medication Sig  . acetaminophen (TYLENOL) 325 MG tablet Take 650 mg by mouth as needed.  Marland Kitchen aspirin EC 81 MG tablet Take 162 mg by mouth daily.  . Cholecalciferol (VITAMIN D3) 5000 units TABS Take 1 tablet by mouth daily.   . cyclobenzaprine (FLEXERIL) 10 MG tablet Take 1 tablet (10 mg total) by mouth as needed.  . folic acid (FOLVITE) 1 MG tablet Take 1 tablet by mouth daily.  . Glucosamine HCl 1000 MG TABS Take 2 tablets by mouth daily.  Marland Kitchen  levothyroxine (SYNTHROID, LEVOTHROID) 150 MCG tablet Take 1 tablet (150 mcg total) by mouth daily before breakfast.  . Multiple Vitamin (MULTIVITAMIN) capsule Take 1 capsule by mouth daily.   . Omega-3 1000 MG CAPS Take 1 tablet by mouth daily.  . rosuvastatin (CRESTOR) 20 MG tablet TAKE ONE TABLET BY MOUTH DAILY  . sildenafil (REVATIO) 20 MG tablet Use 5 tablets PO as needed    Allergies: Patient has No Known Allergies. Family History: Patient family history includes Alzheimer's disease in his mother; Arthritis in his mother; Cancer in his brother and sister; Dementia in his mother; Heart disease in his father and  paternal grandmother; Hyperlipidemia in his brother; Stroke in his brother and mother. Social History:  Patient  reports that he has never smoked. He has never used smokeless tobacco. He reports current alcohol use. He reports that he does not use drugs.  Review of Systems: Constitutional: Negative for fever malaise or anorexia Cardiovascular: negative for chest pain Respiratory: negative for SOB or persistent cough Gastrointestinal: negative for abdominal pain  Objective  Vitals: BP 110/60   Pulse (!) 102   Temp 97.9 F (36.6 C) (Oral)   Resp 20   Ht 6' 0.75" (1.848 m)   Wt 240 lb 9.6 oz (109.1 kg)   SpO2 98%   BMI 31.96 kg/m  General: no acute distress , A&Ox3 HEENT: PEERL, conjunctiva remain icteric, Oropharynx moist,neck is supple Cardiovascular:  RRR without murmur or gallop.  Respiratory:  Good breath sounds bilaterally, CTAB with normal respiratory effort no rales Bilateral lower extremities with trace to +1 pitting edema around ankles.  No calf swelling, tenderness or cords. Skin:  Warm, no rashes  No visits with results within 1 Day(s) from this visit.  Latest known visit with results is:  Appointment on 02/05/2019  Component Date Value Ref Range Status  . Sodium 02/05/2019 128* 135 - 145 mEq/L Final  . Potassium 02/05/2019 3.3* 3.5 - 5.1 mEq/L Final  . Chloride 02/05/2019 99  96 - 112 mEq/L Final  . CO2 02/05/2019 22  19 - 32 mEq/L Final  . Glucose, Bld 02/05/2019 128* 70 - 99 mg/dL Final  . BUN 02/05/2019 12  6 - 23 mg/dL Final  . Creatinine, Ser 02/05/2019 1.21  0.40 - 1.50 mg/dL Final  . Total Bilirubin 02/05/2019 18.2* 0.2 - 1.2 mg/dL Final  . Alkaline Phosphatase 02/05/2019 248* 39 - 117 U/L Final  . AST 02/05/2019 695* 0 - 37 U/L Final  . ALT 02/05/2019 374* 0 - 53 U/L Final  . Total Protein 02/05/2019 6.2  6.0 - 8.3 g/dL Final  . Albumin 02/05/2019 2.5* 3.5 - 5.2 g/dL Final  . Calcium 02/05/2019 8.2* 8.4 - 10.5 mg/dL Final  . GFR 02/05/2019 58.70* >60.00  mL/min Final  . WBC 02/05/2019 3.0* 4.0 - 10.5 K/uL Final  . RBC 02/05/2019 3.22* 4.22 - 5.81 Mil/uL Final  . Hemoglobin 02/05/2019 11.5* 13.0 - 17.0 g/dL Final  . HCT 02/05/2019 32.4* 39.0 - 52.0 % Final  . MCV 02/05/2019 100.5* 78.0 - 100.0 fl Final  . MCHC 02/05/2019 35.4  30.0 - 36.0 g/dL Final  . RDW 02/05/2019 19.8* 11.5 - 15.5 % Final  . Platelets 02/05/2019 107.0* 150.0 - 400.0 K/uL Final  . Neutrophils Relative % 02/05/2019 68.6  43.0 - 77.0 % Final  . Lymphocytes Relative 02/05/2019 13.8  12.0 - 46.0 % Final  . Monocytes Relative 02/05/2019 14.3* 3.0 - 12.0 % Final  . Eosinophils Relative 02/05/2019 2.0  0.0 -  5.0 % Final  . Basophils Relative 02/05/2019 1.3  0.0 - 3.0 % Final  . Neutro Abs 02/05/2019 2.0  1.4 - 7.7 K/uL Final  . Lymphs Abs 02/05/2019 0.4* 0.7 - 4.0 K/uL Final  . Monocytes Absolute 02/05/2019 0.4  0.1 - 1.0 K/uL Final  . Eosinophils Absolute 02/05/2019 0.1  0.0 - 0.7 K/uL Final  . Basophils Absolute 02/05/2019 0.0  0.0 - 0.1 K/uL Final  . INR 02/05/2019 1.8* 0.8 - 1.0 ratio Final  . Prothrombin Time 02/05/2019 20.5* 9.6 - 13.1 sec Final      Commons side effects, risks, benefits, and alternatives for medications and treatment plan prescribed today were discussed, and the patient expressed understanding of the given instructions. Patient is instructed to call or message via MyChart if he/she has any questions or concerns regarding our treatment plan. No barriers to understanding were identified. We discussed Red Flag symptoms and signs in detail. Patient expressed understanding regarding what to do in case of urgent or emergency type symptoms.   Medication list was reconciled, printed and provided to the patient in AVS. Patient instructions and summary information was reviewed with the patient as documented in the AVS. This note was prepared with assistance of Dragon voice recognition software. Occasional wrong-word or sound-a-like substitutions may have occurred  due to the inherent limitations of voice recognition software

## 2019-02-06 NOTE — Patient Instructions (Signed)
Please hang in there!!!  I will watch your labs weekly.   Please restrict your fluid intake to 1-1.5L daily. Please start an oral sodium chloride tablet daily as well if you'd like (this can be purchased otc).   Your swelling is minimal and I would not favor other testing for it at this time.    Syndrome of Inappropriate Antidiuretic Hormone Syndrome of inappropriate antidiuretic hormone (SIADH) is a condition in which extra antidiuretic hormone (ADH) is produced in the body. ADH controls water levels in the body. When too much of the hormone is produced, it can cause problems such as low salt (sodium) levels in your blood and reduced urine production. When this happens, the body retains too much water, which can cause various symptoms. Symptoms can range from mild to severe. SIADH is most common in people who are hospitalized for another reason. What are the causes? This condition may be caused by:  Tumors.  Injury to the brain.  Infection or inflammation in the brain, such as meningitis or encephalitis.  Lung infections or diseases.  Cancer.  Major surgery.  Extreme exercise.  Stroke.  Guillain-Barr syndrome (GBS).  Medicines, including chemotherapy drugs, NSAIDs, and pain medicines.  Problems with breathing (respiratory failure).  AIDS. What increases the risk? The following factors may make you more likely to develop this condition:  Increasing age.  Recent major surgery.  Hospitalization. What are the signs or symptoms? Symptoms of this condition include:  Irritability.  Muscle cramps.  Nausea or vomiting.  Mental changes, such as memory problems, aggressiveness, confusion, or hallucinations.  Seizure.  Coma. How is this diagnosed? This condition may be diagnosed based on:  Your medical history.  A physical exam.  Blood tests.  Urine tests.  Chest X-ray or brain scan. How is this treated? Treatment for this condition focuses on relieving  symptoms. Treatment may include:  Restricting fluid intake.  Receiving sterile salt water (saline) slowly through an IV.  Medicines to help remove water from the body (diuretics).  Medicines to help control sodium levels. Initial treatment for SIADH is done in a hospital, so your health care team can monitor your condition closely. Other treatments may focus on treating the underlying cause of the condition. Follow these instructions at home:  Follow your health care provider's instructions on restricting your intake of fluids.  Take over-the-counter and prescription medicines only as told by your health care provider.  Keep all follow-up visits as told by your health care provider. This is important. Contact a health care provider if you:  Have muscle cramps.  Are nauseous or you vomit. Get help right away if you have:  A seizure.  Loss of consciousness.  Mental changes, such as memory problems, aggressiveness, confusion, or hallucinations. Summary  Syndrome of inappropriate antidiuretic hormone (SIADH) is a condition in which extra antidiuretic hormone (ADH) is produced in the body.  SIADH is most common in people who are hospitalized for another reason.  Initial treatment for SIADH is done in the hospital setting in order to monitor your condition closely.  Follow your health care provider's instructions on restricting your intake of fluids. This information is not intended to replace advice given to you by your health care provider. Make sure you discuss any questions you have with your health care provider. Document Released: 06/19/2014 Document Revised: 12/15/2017 Document Reviewed: 12/15/2017 Elsevier Interactive Patient Education  2019 [employer].

## 2019-02-09 ENCOUNTER — Telehealth: Payer: Self-pay | Admitting: Family Medicine

## 2019-02-09 DIAGNOSIS — R748 Abnormal levels of other serum enzymes: Secondary | ICD-10-CM

## 2019-02-09 DIAGNOSIS — M6281 Muscle weakness (generalized): Secondary | ICD-10-CM

## 2019-02-09 DIAGNOSIS — E871 Hypo-osmolality and hyponatremia: Secondary | ICD-10-CM

## 2019-02-09 DIAGNOSIS — B159 Hepatitis A without hepatic coma: Secondary | ICD-10-CM

## 2019-02-09 DIAGNOSIS — E222 Syndrome of inappropriate secretion of antidiuretic hormone: Secondary | ICD-10-CM

## 2019-02-09 NOTE — Telephone Encounter (Signed)
Copied from [place] [id number]. Topic: Quick Communication - See Telephone Encounter >> Feb 09, 2019  1:34 PM Bea Graff, NT wrote: CRM for notification. See Telephone encounter for: 02/09/19. Pt has a follow-up question for Dr. Jonni Sanger. He states he got instructions from his GI doctor late Tuesday afternoon, and that doctor indicated the pt should be on fluid and sodium restriction. He states that the sodium restriction conflicts with what he was instructed by Dr. Jonni Sanger. As she advised to increase sodium intake. Pt would like to discuss what he should do.

## 2019-02-12 ENCOUNTER — Other Ambulatory Visit (INDEPENDENT_AMBULATORY_CARE_PROVIDER_SITE_OTHER): Payer: Medicare Other

## 2019-02-12 DIAGNOSIS — R932 Abnormal findings on diagnostic imaging of liver and biliary tract: Secondary | ICD-10-CM

## 2019-02-12 DIAGNOSIS — B159 Hepatitis A without hepatic coma: Secondary | ICD-10-CM

## 2019-02-12 DIAGNOSIS — R748 Abnormal levels of other serum enzymes: Secondary | ICD-10-CM

## 2019-02-12 LAB — CBC WITH DIFFERENTIAL/PLATELET
Basophils Absolute: 0 10*3/uL (ref 0.0–0.1)
Basophils Relative: 1.4 % (ref 0.0–3.0)
EOS PCT: 3.2 % (ref 0.0–5.0)
Eosinophils Absolute: 0.1 10*3/uL (ref 0.0–0.7)
HCT: 34.1 % — ABNORMAL LOW (ref 39.0–52.0)
Hemoglobin: 11.9 g/dL — ABNORMAL LOW (ref 13.0–17.0)
Lymphocytes Relative: 18 % (ref 12.0–46.0)
Lymphs Abs: 0.6 10*3/uL — ABNORMAL LOW (ref 0.7–4.0)
MCHC: 35 g/dL (ref 30.0–36.0)
MCV: 102.8 fl — ABNORMAL HIGH (ref 78.0–100.0)
MONOS PCT: 14.1 % — AB (ref 3.0–12.0)
Monocytes Absolute: 0.5 10*3/uL (ref 0.1–1.0)
Neutro Abs: 2 10*3/uL (ref 1.4–7.7)
Neutrophils Relative %: 63.3 % (ref 43.0–77.0)
Platelets: 87 10*3/uL — ABNORMAL LOW (ref 150.0–400.0)
RBC: 3.31 Mil/uL — ABNORMAL LOW (ref 4.22–5.81)
RDW: 18.5 % — ABNORMAL HIGH (ref 11.5–15.5)
WBC: 3.2 10*3/uL — ABNORMAL LOW (ref 4.0–10.5)

## 2019-02-12 LAB — PROTIME-INR
INR: 1.8 ratio — ABNORMAL HIGH (ref 0.8–1.0)
Prothrombin Time: 20.3 s — ABNORMAL HIGH (ref 9.6–13.1)

## 2019-02-12 LAB — COMPREHENSIVE METABOLIC PANEL
ALBUMIN: 2.4 g/dL — AB (ref 3.5–5.2)
ALT: 283 U/L — ABNORMAL HIGH (ref 0–53)
AST: 596 U/L — ABNORMAL HIGH (ref 0–37)
Alkaline Phosphatase: 317 U/L — ABNORMAL HIGH (ref 39–117)
BUN: 16 mg/dL (ref 6–23)
CO2: 25 mEq/L (ref 19–32)
Calcium: 8.5 mg/dL (ref 8.4–10.5)
Chloride: 107 mEq/L (ref 96–112)
Creatinine, Ser: 1.18 mg/dL (ref 0.40–1.50)
GFR: 60.42 mL/min (ref >60.00)
Glucose, Bld: 91 mg/dL (ref 70–99)
Potassium: 3.8 mEq/L (ref 3.5–5.1)
SODIUM: 138 meq/L (ref 135–145)
Total Bilirubin: 13.4 mg/dL — ABNORMAL HIGH (ref 0.2–1.2)
Total Protein: 6.4 g/dL (ref 6.0–8.3)

## 2019-02-14 ENCOUNTER — Other Ambulatory Visit: Payer: Self-pay

## 2019-02-14 ENCOUNTER — Encounter: Payer: Self-pay | Admitting: Gastroenterology

## 2019-02-14 ENCOUNTER — Ambulatory Visit: Payer: Medicare Other | Admitting: Gastroenterology

## 2019-02-14 VITALS — BP 104/68 | HR 106 | Temp 98.2°F | Ht 73.0 in | Wt 245.1 lb

## 2019-02-14 DIAGNOSIS — B159 Hepatitis A without hepatic coma: Secondary | ICD-10-CM | POA: Diagnosis not present

## 2019-02-14 DIAGNOSIS — E871 Hypo-osmolality and hyponatremia: Secondary | ICD-10-CM | POA: Diagnosis not present

## 2019-02-14 DIAGNOSIS — R748 Abnormal levels of other serum enzymes: Secondary | ICD-10-CM

## 2019-02-14 NOTE — Patient Instructions (Signed)
Have your lab work done on Monday 02/19/2019

## 2019-02-14 NOTE — Progress Notes (Signed)
Referring Provider: [name], MD Primary Care Physician:  [name], MD Hepatologist: Dorcas Mcmurray  Reason for Consultation: Acute on chronic liver disease   IMPRESSION:  Cholestatic, relapsing hepatitis A Diffuse myalgias and muscle weakness Bilateral lower extremity edema    - may be attributed to hypoalbuminemia Hyponatremia, likely SIADH    - no evidence for cirrhosis or portal hypertension to suggest Type II HRS pathophysiology Persistent coagulopathy    - received outpatient vitamin K replacement    - received inpatient vitamin K and NAC Hyperbilirubinemia, now improving Abnormal transaminases, now improving    - normal early 2019     - acute elevation noted in 12/2018 Abnormal gallbladder ultrasound 12/29/2018    - echogenic liver    - diffusely thickened and edematous gallbladder, no pericholecystic fluid    - negative sonographic Murphy sign, no biliary dilatation Hypothyroidism  Liver enzymes are overall improving, including is bilirubin. Unfortunately, his coagulopathy has not changed.   New reports of muscle weakness, progressive since his initial presentation. There are reports of polymyositis with acute viral hepatitis. As other etiologies must be considered,  I feel this needs to be evaluated and will ask Dr. Jonni Sanger for her input.   Hyponatremia has improved. Appreciate Dr. Tamela Oddi involvement in management of his treatment.  LE edema persists. Hypoalbuminemia is unchanged. Will defer further needs for evaluation/treatment to Dr. Jonni Sanger.    PLAN: Continue CMP, direct bilirubin, PT/INR every Monday Follow-up with Dr. Jonni Sanger regarding the muscle weakness Follow-up at [facility] with Dr. 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 The interval history is obtained through the patient and review of his electronic health record. He is  unaccompanied to this appointment.   Pleased to be off his fluid restriction after his serum sodium normalized on his labs earlier this week. Concerned that he continues to have LE edema.  Also has persistent shortness of breath, non-productive cough, chest pain.   Now with concerns about diffuse muscle weakness and soreness. Thighs and calves were stabbing when he first went to see Dr. Jonni Sanger prior to his hepatitis A diagnosis. Stopped Crestor and he felt like that provided some relief. But, he is concerned that it is progressing. He has become a couch potato without the strength to do anything. Doesn't move much. Feels like his legs can no longer support him. Walking very slowly. Muscles feel "flaccid." He feels that he has to lift his legs with his arms to get out of a seated position.   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 He is discouraged that he is unable to do it this year to due to his weakness.   He has follow-up with Dr. Brigitte Pulse at [facility] 02/21/19.  Liver biopsy results are still not available in [place].  I have reviewed the following recent labs: Labs 01/31/19: Na 132, TB 14.3, alb 2.6, alk phos 189, AST 877, ALT 558, INR 1.8 Labs 02/05/19: Na 128, TB 18.2, alb 2.5, alk phos 248, AST 695, ALT 374, platelets 107, 1.8. Labs 02/12/19: Na 138, TB 13, alb 2.4, alk phos 317, AST 596, ALT 283, platelets 87, INR 1.8.     Past Medical History:  Diagnosis Date  . Adenomatous polyps   . ED (erectile dysfunction)   . Hepatitis A   . Hyperlipidemia   . Hypothyroidism   .  Varicocele 02/01/2011   Left testicle    Past Surgical History:  Procedure Laterality Date  . EYE SURGERY Right 1956  . EYE SURGERY Left 1963  . LIVER BIOPSY    . TONSILLECTOMY      Current Outpatient Medications  Medication Sig Dispense Refill  . acetaminophen (TYLENOL) 325 MG tablet Take 650 mg by mouth as needed.    Marland Kitchen aspirin EC 81 MG tablet Take 162 mg by mouth daily.     . Cholecalciferol (VITAMIN D3) 5000 units TABS Take 1 tablet by mouth daily.     . cyclobenzaprine (FLEXERIL) 10 MG tablet Take 1 tablet (10 mg total) by mouth as needed. 30 tablet 2  . folic acid (FOLVITE) 1 MG tablet Take 1 tablet by mouth daily.    . Glucosamine HCl 1000 MG TABS Take 2 tablets by mouth daily.    Marland Kitchen levothyroxine (SYNTHROID, LEVOTHROID) 150 MCG tablet Take 1 tablet (150 mcg total) by mouth daily before breakfast. 90 tablet 3  . Multiple Vitamin (MULTIVITAMIN) capsule Take 1 capsule by mouth daily.     . Omega-3 1000 MG CAPS Take 1 tablet by mouth daily.    . rosuvastatin (CRESTOR) 20 MG tablet TAKE ONE TABLET BY MOUTH DAILY 90 tablet 0  . sildenafil (REVATIO) 20 MG tablet Use 5 tablets PO as needed 100 tablet 3  . sodium chloride 1 g tablet Take 1 tablet (1 g total) by mouth daily.     No current facility-administered medications for this visit.     Allergies as of 02/14/2019  . (No Known Allergies)    Family History  Problem Relation Age of Onset  . Alzheimer's disease Mother   . Arthritis Mother   . Dementia Mother   . Stroke Mother   . Heart disease Father   . Cancer Sister        uterine  . Hyperlipidemia Brother   . Stroke Brother   . Cancer Brother   . Heart disease Paternal Grandmother   . Colon cancer Neg Hx   . Esophageal cancer Neg Hx     Social History   Socioeconomic History  . Marital status: Married    Spouse name: Not on file  . Number of children: Not on file  . Years of education: Not on file  . Highest education level: Not on file  Occupational History  . Not on file  Social Needs  . Financial resource strain: Not on file  . Food insecurity:    Worry: Not on file    Inability: Not on file  . Transportation needs:    Medical: Not on file    Non-medical: Not on file  Tobacco Use  . Smoking status: Former Research scientist (life sciences)  . Smokeless tobacco: Never Used  . Tobacco comment: quit 1975  Substance and Sexual Activity  . Alcohol use: Not  Currently    Comment: stopped January 2020  . Drug use: No  . Sexual activity: Yes  Lifestyle  . Physical activity:    Days per week: Not on file    Minutes per session: Not on file  . Stress: Not on file  Relationships  . Social connections:    Talks on phone: Not on file    Gets together: Not on file    Attends religious service: Not on file    Active member of club or organization: Not on file    Attends meetings of clubs or organizations: Not on file    Relationship  status: Not on file  . Intimate partner violence:    Fear of current or ex partner: Not on file    Emotionally abused: Not on file    Physically abused: Not on file    Forced sexual activity: Not on file  Other Topics Concern  . Not on file  Social History Narrative  . Not on file   Filed Weights   02/14/19 1027  Weight: 245 lb 2 oz (111.2 kg)    Physical Exam: Vital signs were reviewed. General:   Alert, well-nourished, pleasant and cooperative in NAD.  Jaundice has slightly improved. Walks into the office without assistance.  Head:  Normocephalic and atraumatic. Eyes:  Scleral icterus present.   Conjunctiva pink. Mouth:  No deformity or lesions.   Neck:  Supple; no thyromegaly. Lungs:  Clear throughout to auscultation.   No wheezes. Heart:  Regular rate and rhythm; no murmurs. Trace lateral pitting LE edema.  Abdomen:  Soft, nontender, normal bowel sounds. No rebound or guarding. Negative Murphy's sign. Liver edge remains nontender and palpable 3 fingerbreaths below the costal margin in the midclavicular line. Spleen tip not palpable.  LAD: No inguinal or umbilical LAD Rectal:  Deferred  Msk:  Symmetrical without gross deformities. Extremities:  Trace LE edema.  Neurologic:  Alert and  oriented x4;  Upper extremity strength 5/5 bilaterally. Lower extremity strength 4+/5.   No asterixis or clonus. Skin:  No rash or bruise. No spider angiomas or palmar erythema. No tattoos.  Psych:  Alert and  cooperative, frustrated. Normal mood and affect.   Marybella Ethier L. Tarri Glenn, MD, MPH [facility] 02/14/2019, 9:49 PM

## 2019-02-16 ENCOUNTER — Encounter: Payer: Self-pay | Admitting: Family Medicine

## 2019-02-16 ENCOUNTER — Other Ambulatory Visit: Payer: Self-pay

## 2019-02-16 ENCOUNTER — Ambulatory Visit (INDEPENDENT_AMBULATORY_CARE_PROVIDER_SITE_OTHER): Payer: Medicare Other | Admitting: Family Medicine

## 2019-02-16 VITALS — BP 122/76 | HR 94 | Temp 98.2°F | Resp 18 | Ht 73.0 in | Wt 247.6 lb

## 2019-02-16 DIAGNOSIS — E222 Syndrome of inappropriate secretion of antidiuretic hormone: Secondary | ICD-10-CM | POA: Diagnosis not present

## 2019-02-16 DIAGNOSIS — M791 Myalgia, unspecified site: Secondary | ICD-10-CM

## 2019-02-16 DIAGNOSIS — E871 Hypo-osmolality and hyponatremia: Secondary | ICD-10-CM

## 2019-02-16 DIAGNOSIS — M6289 Other specified disorders of muscle: Secondary | ICD-10-CM

## 2019-02-16 DIAGNOSIS — M6281 Muscle weakness (generalized): Secondary | ICD-10-CM

## 2019-02-16 DIAGNOSIS — B159 Hepatitis A without hepatic coma: Secondary | ICD-10-CM

## 2019-02-16 DIAGNOSIS — R600 Localized edema: Secondary | ICD-10-CM

## 2019-02-16 LAB — COMPREHENSIVE METABOLIC PANEL
ALK PHOS: 352 U/L — AB (ref 39–117)
ALT: 352 U/L — ABNORMAL HIGH (ref 0–53)
AST: 894 U/L — ABNORMAL HIGH (ref 0–37)
Albumin: 2.3 g/dL — ABNORMAL LOW (ref 3.5–5.2)
BILIRUBIN TOTAL: 12.6 mg/dL — AB (ref 0.2–1.2)
BUN: 23 mg/dL (ref 6–23)
CO2: 25 mEq/L (ref 19–32)
Calcium: 8.1 mg/dL — ABNORMAL LOW (ref 8.4–10.5)
Chloride: 98 mEq/L (ref 96–112)
Creatinine, Ser: 1.15 mg/dL (ref 0.40–1.50)
GFR: 62.24 mL/min (ref >60.00)
Glucose, Bld: 93 mg/dL (ref 70–99)
Potassium: 3.9 mEq/L (ref 3.5–5.1)
Sodium: 130 mEq/L — ABNORMAL LOW (ref 135–145)
Total Protein: 6.3 g/dL (ref 6.0–8.3)

## 2019-02-16 LAB — CBC WITH DIFFERENTIAL/PLATELET
Basophils Absolute: 0 10*3/uL (ref 0.0–0.1)
Basophils Relative: 0.6 % (ref 0.0–3.0)
Eosinophils Absolute: 0 10*3/uL (ref 0.0–0.7)
Eosinophils Relative: 0.8 % (ref 0.0–5.0)
HCT: 32.7 % — ABNORMAL LOW (ref 39.0–52.0)
Hemoglobin: 11.4 g/dL — ABNORMAL LOW (ref 13.0–17.0)
LYMPHS ABS: 0.3 10*3/uL — AB (ref 0.7–4.0)
Lymphocytes Relative: 9.2 % — ABNORMAL LOW (ref 12.0–46.0)
MCHC: 34.9 g/dL (ref 30.0–36.0)
MCV: 104.3 fl — AB (ref 78.0–100.0)
MONOS PCT: 12.9 % — AB (ref 3.0–12.0)
Monocytes Absolute: 0.4 10*3/uL (ref 0.1–1.0)
NEUTROS ABS: 2.5 10*3/uL (ref 1.4–7.7)
Neutrophils Relative %: 76.5 % (ref 43.0–77.0)
Platelets: 85 10*3/uL — ABNORMAL LOW (ref 150.0–400.0)
RBC: 3.13 Mil/uL — ABNORMAL LOW (ref 4.22–5.81)
RDW: 17.2 % — ABNORMAL HIGH (ref 11.5–15.5)
WBC: 3.3 10*3/uL — ABNORMAL LOW (ref 4.0–10.5)

## 2019-02-16 LAB — C-REACTIVE PROTEIN: CRP: 5.1 mg/dL (ref 0.5–20.0)

## 2019-02-16 LAB — SEDIMENTATION RATE: Sed Rate: 67 mm/hr — ABNORMAL HIGH (ref 0–20)

## 2019-02-16 LAB — CK: Total CK: 3855 U/L — ABNORMAL HIGH (ref 7–232)

## 2019-02-16 LAB — TSH: TSH: 1.33 u[IU]/mL (ref 0.35–4.50)

## 2019-02-16 NOTE — Patient Instructions (Addendum)
I will get back with you on your lab results. Be cautious to avoid further falls.  We will get your toilet seat lift ordered.   Avoid crestor and aspirin for now.

## 2019-02-16 NOTE — Progress Notes (Signed)
Subjective  CC:  Chief Complaint  Patient presents with  . General pain    all over, he rates his pain about 6-7/10. Pain is mostly when moving around    HPI: Timothy Grant is a 73 y.o. male who presents to the office today to address the problems listed above in the chief complaint.  Timothy Grant returns for evaluation.  He is now developed proximal limb girdle muscle weakness associated with pain.  This happened over the last week gradually.  Now he cannot lift things above his head, cannot get out of a chair unassisted.  He did experience a fall last week.  He denies grip strength weakness.  Symptoms are symmetric and bilateral.  He denies headaches, vision changes or pain with mastication.  He has no rash.  He denies back pain, bowel or bladder problems.  Relapsing hepatitis A, cholestatic: Reviewed Dr. Payton Emerald note and most recent lab values.  Fortunately liver enzymes are improving.  However he continues to be fatigued, experiencing GERD and belching, shortness of breath and abdominal bloating with lower extremity edema.  Swelling of the abdomen and lower extremities comes and goes.  SIADH hyponatremia: On 2 L or less fluid restriction and daily salt tablets.  Sodium had normalized when last checked.  Assessment  1. Proximal limb muscle weakness   2. Myalgia   3. Acute hepatitis A   4. SIADH (syndrome of inappropriate ADH production) ([place])   5. Hyponatremia   6. Bilateral lower extremity edema      Plan   Proximal muscle weakness and myalgias: Clinical syndrome could be consistent with polymyositis, PMR or other myopathy.  Education given.  Start with checking lab work.  Further testing may include EMG studies, and/or referral to neurology, rheumatology or dermatology depending on lab test results and/or need for biopsies.  Education on fall prevention.  Elevated toilet seat ordered for patient.  He declines walker or cane at this time.  Pain is controlled with rest.  Stop  Crestor, Tylenol and aspirin.  May start steroids pending lab results.  At this time he denies esophageal dysfunction.  Pulmonary function seems normal as well.  He does suffer from fatigue.  Hepatitis: Per GI.  Hyponatremia due to SIADH: Improving.  Recheck today.  Adjust medications pending results.  Edema and possible ascites due to hypoalbuminemia from liver insufficiency: May warrant judicious use of diuretics.  Will follow.  Follow up:   03/28/2019  Orders Placed This Encounter  Procedures  . Elevated toilet seat  . CBC with Differential/Platelet  . Comprehensive metabolic panel  . Sedimentation rate  . CK (Creatine Kinase)  . Aldolase  . Antinuclear Antib (ANA)  . Sjogren's syndrome antibods(ssa + ssb)  . Sm and Sm/RNP Antibodies  . C-reactive protein  . TSH  . Anti-Jo 1 antibody, IgG   No orders of the defined types were placed in this encounter.     I reviewed the patients updated PMH, FH, and SocHx.    Patient Active Problem List   Diagnosis Date Noted  . Acquired hypothyroidism 02/05/2011    Priority: High  . Adenomatous colon polyp 02/05/2011    Priority: High  . Mixed hyperlipidemia 02/05/2011    Priority: High  . Osteoarthritis of finger of left hand 01/07/2017    Priority: Medium  . Leukopenia 08/09/2012    Priority: Medium  . Trigger finger, left ring finger 01/07/2017    Priority: Low  . Male erectile disorder 02/09/2012    Priority:  Low   Current Meds  Medication Sig  . acetaminophen (TYLENOL) 325 MG tablet Take 650 mg by mouth as needed.  . Cholecalciferol (VITAMIN D3) 5000 units TABS Take 1 tablet by mouth daily.   . cyclobenzaprine (FLEXERIL) 10 MG tablet Take 1 tablet (10 mg total) by mouth as needed.  . folic acid (FOLVITE) 1 MG tablet Take 1 tablet by mouth daily.  . Glucosamine HCl 1000 MG TABS Take 2 tablets by mouth daily.  Marland Kitchen levothyroxine (SYNTHROID, LEVOTHROID) 150 MCG tablet Take 1 tablet (150 mcg total) by mouth daily before  breakfast.  . Multiple Vitamin (MULTIVITAMIN) capsule Take 1 capsule by mouth daily.   . Omega-3 1000 MG CAPS Take 1 tablet by mouth daily.  . sildenafil (REVATIO) 20 MG tablet Use 5 tablets PO as needed  . sodium chloride 1 g tablet Take 1 tablet (1 g total) by mouth daily.  . [DISCONTINUED] aspirin EC 81 MG tablet Take 162 mg by mouth daily.    Allergies: Patient has No Known Allergies. Family History: Patient family history includes Alzheimer's disease in his mother; Arthritis in his mother; Cancer in his brother and sister; Dementia in his mother; Heart disease in his father and paternal grandmother; Hyperlipidemia in his brother; Stroke in his brother and mother. Social History:  Patient  reports that he has quit smoking. He has never used smokeless tobacco. He reports previous alcohol use. He reports that he does not use drugs.  Review of Systems: Constitutional: Negative for fever malaise or anorexia Cardiovascular: negative for chest pain Respiratory: negative for SOB or persistent cough Gastrointestinal: negative for abdominal pain  Objective  Vitals: BP 122/76   Pulse 94   Temp 98.2 F (36.8 C) (Oral)   Resp 18   Ht 6\' 1"  (1.854 m)   Wt 247 lb 9.6 oz (112.3 kg)   SpO2 98%   BMI 32.67 kg/m  General: no acute distress , A&Ox3, non-labored breathing HEENT: PEERL, icteric conjunctivo-bilaterally, , Oropharynx moist,neck is supple, no temporal artery tenderness bilaterally Cardiovascular:  RRR without murmur or gallop.  Respiratory:  Good breath sounds bilaterally, CTAB with normal respiratory effort, no rales Abdomen: Mildly distended, questionable ascites, nontender Lower extremities with +1 bilateral pitting edema to mid calves. Musculoskeletal: Proximal upper and lower extremity weakness: Shoulder girdle: 4-5 strength, lower extremity hip flexors 3+/5 bilaterally, biceps 4+/5 triceps 4+/5, normal grip strength bilaterally Cannot get reflexes bilaterally Skin:  Warm,  no rashes     Commons side effects, risks, benefits, and alternatives for medications and treatment plan prescribed today were discussed, and the patient expressed understanding of the given instructions. Patient is instructed to call or message via MyChart if he/she has any questions or concerns regarding our treatment plan. No barriers to understanding were identified. We discussed Red Flag symptoms and signs in detail. Patient expressed understanding regarding what to do in case of urgent or emergency type symptoms.   Medication list was reconciled, printed and provided to the patient in AVS. Patient instructions and summary information was reviewed with the patient as documented in the AVS. This note was prepared with assistance of Dragon voice recognition software. Occasional wrong-word or sound-a-like substitutions may have occurred due to the inherent limitations of voice recognition software

## 2019-02-17 LAB — ANTI-JO 1 ANTIBODY, IGG: Anti JO-1: <0.2 AI (ref 0.0–0.9)

## 2019-02-17 MED ORDER — SPIRONOLACTONE 25 MG PO TABS: 25.0000 mg | ORAL_TABLET | Freq: Every day | ORAL | 2 refills | Status: DC

## 2019-02-17 NOTE — Addendum Note (Signed)
Addended by: Billey Chang on: 02/17/2019 09:51 AM   Modules accepted: Orders

## 2019-02-19 ENCOUNTER — Other Ambulatory Visit: Payer: Self-pay

## 2019-02-19 ENCOUNTER — Telehealth: Payer: Self-pay | Admitting: Family Medicine

## 2019-02-19 ENCOUNTER — Other Ambulatory Visit (INDEPENDENT_AMBULATORY_CARE_PROVIDER_SITE_OTHER): Payer: Medicare Other

## 2019-02-19 DIAGNOSIS — B159 Hepatitis A without hepatic coma: Secondary | ICD-10-CM

## 2019-02-19 DIAGNOSIS — R748 Abnormal levels of other serum enzymes: Secondary | ICD-10-CM

## 2019-02-19 DIAGNOSIS — M791 Myalgia, unspecified site: Secondary | ICD-10-CM

## 2019-02-19 DIAGNOSIS — M6281 Muscle weakness (generalized): Secondary | ICD-10-CM

## 2019-02-19 DIAGNOSIS — R932 Abnormal findings on diagnostic imaging of liver and biliary tract: Secondary | ICD-10-CM | POA: Diagnosis not present

## 2019-02-19 LAB — CBC WITH DIFFERENTIAL/PLATELET
Basophils Absolute: 0.1 10*3/uL (ref 0.0–0.1)
Basophils Relative: 1.3 % (ref 0.0–3.0)
Eosinophils Absolute: 0.1 10*3/uL (ref 0.0–0.7)
Eosinophils Relative: 1.4 % (ref 0.0–5.0)
HEMATOCRIT: 34.3 % — AB (ref 39.0–52.0)
Hemoglobin: 12 g/dL — ABNORMAL LOW (ref 13.0–17.0)
Lymphocytes Relative: 9.2 % — ABNORMAL LOW (ref 12.0–46.0)
Lymphs Abs: 0.4 10*3/uL — ABNORMAL LOW (ref 0.7–4.0)
MCHC: 34.9 g/dL (ref 30.0–36.0)
MCV: 105.9 fl — ABNORMAL HIGH (ref 78.0–100.0)
Monocytes Absolute: 0.6 10*3/uL (ref 0.1–1.0)
Monocytes Relative: 12.8 % — ABNORMAL HIGH (ref 3.0–12.0)
Neutro Abs: 3.6 10*3/uL (ref 1.4–7.7)
Neutrophils Relative %: 75.3 % (ref 43.0–77.0)
PLATELETS: 111 10*3/uL — AB (ref 150.0–400.0)
RBC: 3.24 Mil/uL — ABNORMAL LOW (ref 4.22–5.81)
RDW: 16.6 % — ABNORMAL HIGH (ref 11.5–15.5)
WBC: 4.8 10*3/uL (ref 4.0–10.5)

## 2019-02-19 LAB — SM AND SM/RNP ANTIBODIES
ENA SM Ab Ser-aCnc: <1 AI
SM/RNP: <1 AI

## 2019-02-19 LAB — COMPREHENSIVE METABOLIC PANEL
ALT: 500 U/L — ABNORMAL HIGH (ref 0–53)
AST: 1524 U/L — AB (ref 0–37)
Albumin: 2.4 g/dL — ABNORMAL LOW (ref 3.5–5.2)
Alkaline Phosphatase: 379 U/L — ABNORMAL HIGH (ref 39–117)
BUN: 31 mg/dL — ABNORMAL HIGH (ref 6–23)
CALCIUM: 8.8 mg/dL (ref 8.4–10.5)
CO2: 24 mEq/L (ref 19–32)
Chloride: 100 mEq/L (ref 96–112)
Creatinine, Ser: 1.22 mg/dL (ref 0.40–1.50)
GFR: 58.14 mL/min — ABNORMAL LOW (ref >60.00)
Glucose, Bld: 95 mg/dL (ref 70–99)
Potassium: 4.4 mEq/L (ref 3.5–5.1)
Sodium: 131 mEq/L — ABNORMAL LOW (ref 135–145)
Total Bilirubin: 13.6 mg/dL — ABNORMAL HIGH (ref 0.2–1.2)
Total Protein: 7.1 g/dL (ref 6.0–8.3)

## 2019-02-19 LAB — PROTIME-INR
INR: 1.8 ratio — ABNORMAL HIGH (ref 0.8–1.0)
Prothrombin Time: 20.9 s — ABNORMAL HIGH (ref 9.6–13.1)

## 2019-02-19 LAB — ANA: Anti Nuclear Antibody(ANA): NEGATIVE

## 2019-02-19 LAB — SJOGREN'S SYNDROME ANTIBODS(SSA + SSB)
SSA (RO) (ENA) ANTIBODY, IGG: NEGATIVE AI
SSB (La) (ENA) Antibody, IgG: <1 AI

## 2019-02-19 LAB — ALDOLASE

## 2019-02-19 NOTE — Telephone Encounter (Signed)
error 

## 2019-02-19 NOTE — Telephone Encounter (Signed)
Please call patient: I sent him a mychart message about his labwork that show inflammation of the muscles.  I spoke to our local rheumatologist who recommends evaluation by [facility].   Please ensure Levada Dy or Danae Chen is working on this today. Dr. Trudie Reed reports that typically we can get them in very quickly.   They can call me if needed to speak to someone.   Please tell patient to go to ER for any worsening weakness or difficulty swallowing or shortness of breath.   Let me know if you have questions or problems.  Thanks!!

## 2019-02-19 NOTE — Telephone Encounter (Signed)
Spoke to patient he is aware to check his Mychart, and that a referral has been placed. He verbalized understanding that further appointment details will come.

## 2019-02-20 ENCOUNTER — Other Ambulatory Visit: Payer: Self-pay

## 2019-02-20 ENCOUNTER — Other Ambulatory Visit: Payer: Medicare Other

## 2019-02-20 DIAGNOSIS — B179 Acute viral hepatitis, unspecified: Secondary | ICD-10-CM

## 2019-02-20 DIAGNOSIS — B159 Hepatitis A without hepatic coma: Secondary | ICD-10-CM

## 2019-02-20 DIAGNOSIS — M6281 Muscle weakness (generalized): Secondary | ICD-10-CM

## 2019-02-20 DIAGNOSIS — R600 Localized edema: Secondary | ICD-10-CM

## 2019-02-20 DIAGNOSIS — R17 Unspecified jaundice: Secondary | ICD-10-CM

## 2019-02-20 DIAGNOSIS — M791 Myalgia, unspecified site: Secondary | ICD-10-CM

## 2019-02-20 DIAGNOSIS — E222 Syndrome of inappropriate secretion of antidiuretic hormone: Secondary | ICD-10-CM

## 2019-02-21 DIAGNOSIS — M6282 Rhabdomyolysis: Secondary | ICD-10-CM

## 2019-02-21 HISTORY — DX: Rhabdomyolysis: M62.82

## 2019-02-21 LAB — ALDOLASE: Aldolase: 233.4 U/L — ABNORMAL HIGH (ref <=8.1)

## 2019-02-26 ENCOUNTER — Other Ambulatory Visit (INDEPENDENT_AMBULATORY_CARE_PROVIDER_SITE_OTHER): Payer: Medicare Other

## 2019-02-26 DIAGNOSIS — R932 Abnormal findings on diagnostic imaging of liver and biliary tract: Secondary | ICD-10-CM

## 2019-02-26 DIAGNOSIS — R748 Abnormal levels of other serum enzymes: Secondary | ICD-10-CM | POA: Diagnosis not present

## 2019-02-26 DIAGNOSIS — B159 Hepatitis A without hepatic coma: Secondary | ICD-10-CM | POA: Diagnosis not present

## 2019-02-26 LAB — CBC WITH DIFFERENTIAL/PLATELET
BASOS ABS: 0 10*3/uL (ref 0.0–0.1)
Basophils Relative: 1.3 % (ref 0.0–3.0)
Eosinophils Absolute: 0.1 10*3/uL (ref 0.0–0.7)
Eosinophils Relative: 3.1 % (ref 0.0–5.0)
HCT: 32.1 % — ABNORMAL LOW (ref 39.0–52.0)
Hemoglobin: 11.6 g/dL — ABNORMAL LOW (ref 13.0–17.0)
Lymphocytes Relative: 18.8 % (ref 12.0–46.0)
Lymphs Abs: 0.5 10*3/uL — ABNORMAL LOW (ref 0.7–4.0)
MCHC: 36 g/dL (ref 30.0–36.0)
MCV: 106.9 fl — AB (ref 78.0–100.0)
MONOS PCT: 14 % — AB (ref 3.0–12.0)
Monocytes Absolute: 0.4 10*3/uL (ref 0.1–1.0)
Neutro Abs: 1.7 10*3/uL (ref 1.4–7.7)
Neutrophils Relative %: 62.8 % (ref 43.0–77.0)
Platelets: 86 10*3/uL — ABNORMAL LOW (ref 150.0–400.0)
RBC: 3.01 Mil/uL — ABNORMAL LOW (ref 4.22–5.81)
RDW: 16.9 % — ABNORMAL HIGH (ref 11.5–15.5)
WBC: 2.8 10*3/uL — ABNORMAL LOW (ref 4.0–10.5)

## 2019-02-26 LAB — COMPREHENSIVE METABOLIC PANEL
ALT: 397 U/L — ABNORMAL HIGH (ref 0–53)
AST: 753 U/L — ABNORMAL HIGH (ref 0–37)
Albumin: 2.6 g/dL — ABNORMAL LOW (ref 3.5–5.2)
Alkaline Phosphatase: 319 U/L — ABNORMAL HIGH (ref 39–117)
BUN: 14 mg/dL (ref 6–23)
CALCIUM: 8.7 mg/dL (ref 8.4–10.5)
CO2: 23 mEq/L (ref 19–32)
Chloride: 98 mEq/L (ref 96–112)
Creatinine, Ser: 1.01 mg/dL (ref 0.40–1.50)
GFR: 72.29 mL/min (ref >60.00)
Glucose, Bld: 100 mg/dL — ABNORMAL HIGH (ref 70–99)
Potassium: 3.6 mEq/L (ref 3.5–5.1)
Sodium: 129 mEq/L — ABNORMAL LOW (ref 135–145)
Total Bilirubin: 11.4 mg/dL — ABNORMAL HIGH (ref 0.2–1.2)
Total Protein: 6.6 g/dL (ref 6.0–8.3)

## 2019-02-26 LAB — PROTIME-INR
INR: 1.7 ratio — AB (ref 0.8–1.0)
Prothrombin Time: 20 s — ABNORMAL HIGH (ref 9.6–13.1)

## 2019-02-27 ENCOUNTER — Other Ambulatory Visit: Payer: Self-pay | Admitting: Emergency Medicine

## 2019-02-27 DIAGNOSIS — B159 Hepatitis A without hepatic coma: Secondary | ICD-10-CM

## 2019-03-01 ENCOUNTER — Ambulatory Visit: Payer: Medicare Other | Admitting: Gastroenterology

## 2019-03-05 ENCOUNTER — Other Ambulatory Visit (INDEPENDENT_AMBULATORY_CARE_PROVIDER_SITE_OTHER): Payer: Medicare Other

## 2019-03-05 ENCOUNTER — Telehealth: Payer: Self-pay | Admitting: Family Medicine

## 2019-03-05 DIAGNOSIS — R748 Abnormal levels of other serum enzymes: Secondary | ICD-10-CM | POA: Diagnosis not present

## 2019-03-05 DIAGNOSIS — B159 Hepatitis A without hepatic coma: Secondary | ICD-10-CM

## 2019-03-05 DIAGNOSIS — R932 Abnormal findings on diagnostic imaging of liver and biliary tract: Secondary | ICD-10-CM | POA: Diagnosis not present

## 2019-03-05 LAB — CBC WITH DIFFERENTIAL/PLATELET
Basophils Absolute: 0 10*3/uL (ref 0.0–0.1)
Basophils Relative: 0.2 % (ref 0.0–3.0)
Eosinophils Absolute: 0.1 10*3/uL (ref 0.0–0.7)
Eosinophils Relative: 3 % (ref 0.0–5.0)
HCT: 33.5 % — ABNORMAL LOW (ref 39.0–52.0)
Hemoglobin: 11.8 g/dL — ABNORMAL LOW (ref 13.0–17.0)
LYMPHS PCT: 23.4 % (ref 12.0–46.0)
Lymphs Abs: 0.6 10*3/uL — ABNORMAL LOW (ref 0.7–4.0)
MCHC: 35.2 g/dL (ref 30.0–36.0)
MCV: 108.5 fl — ABNORMAL HIGH (ref 78.0–100.0)
Monocytes Absolute: 0.4 10*3/uL (ref 0.1–1.0)
Monocytes Relative: 14.2 % — ABNORMAL HIGH (ref 3.0–12.0)
NEUTROS ABS: 1.6 10*3/uL (ref 1.4–7.7)
Neutrophils Relative %: 59.2 % (ref 43.0–77.0)
Platelets: 84 10*3/uL — ABNORMAL LOW (ref 150.0–400.0)
RBC: 3.09 Mil/uL — ABNORMAL LOW (ref 4.22–5.81)
RDW: 16.2 % — ABNORMAL HIGH (ref 11.5–15.5)
WBC: 2.7 10*3/uL — AB (ref 4.0–10.5)

## 2019-03-05 LAB — COMPREHENSIVE METABOLIC PANEL
ALT: 323 U/L — ABNORMAL HIGH (ref 0–53)
AST: 630 U/L — ABNORMAL HIGH (ref 0–37)
Albumin: 2.7 g/dL — ABNORMAL LOW (ref 3.5–5.2)
Alkaline Phosphatase: 317 U/L — ABNORMAL HIGH (ref 39–117)
BUN: 13 mg/dL (ref 6–23)
CO2: 26 mEq/L (ref 19–32)
Calcium: 8.6 mg/dL (ref 8.4–10.5)
Chloride: 98 mEq/L (ref 96–112)
Creatinine, Ser: 0.94 mg/dL (ref 0.40–1.50)
GFR: 78.54 mL/min (ref >60.00)
Glucose, Bld: 100 mg/dL — ABNORMAL HIGH (ref 70–99)
Potassium: 4 mEq/L (ref 3.5–5.1)
Sodium: 130 mEq/L — ABNORMAL LOW (ref 135–145)
Total Bilirubin: 9.3 mg/dL — ABNORMAL HIGH (ref 0.2–1.2)
Total Protein: 7.2 g/dL (ref 6.0–8.3)

## 2019-03-05 LAB — PROTIME-INR
INR: 1.6 ratio — AB (ref 0.8–1.0)
Prothrombin Time: 18.6 s — ABNORMAL HIGH (ref 9.6–13.1)

## 2019-03-05 NOTE — Telephone Encounter (Signed)
From his visits 2-3 weeks ago, both here and at [facility], his weight was in the 240s. I wonder how his scale compares to ours. For now, he needs to try to take in more calories. I suspect his weight is down as well due to the diuretics.   I will review his labs once they return.

## 2019-03-05 NOTE — Telephone Encounter (Signed)
Called Timothy Grant, he reports that last night when he weighed he was down to 223 lbs. He states that he is still having the ankle/lower leg edema; reports in the morning he has less swelling but by the end of the day the edema is noticeable. He also reports he had labs done this morning.

## 2019-03-05 NOTE — Telephone Encounter (Signed)
Pt has concerns regarding his weight loss and was not sure what he should do, pt would like a call back to discuss.

## 2019-03-05 NOTE — Telephone Encounter (Signed)
Pt is aware and verbalized understanding.

## 2019-03-06 LAB — CK, TOTAL(REFL): Total CK: 43 U/L — ABNORMAL LOW (ref 44–196)

## 2019-03-12 ENCOUNTER — Other Ambulatory Visit (INDEPENDENT_AMBULATORY_CARE_PROVIDER_SITE_OTHER): Payer: Medicare Other

## 2019-03-12 DIAGNOSIS — R748 Abnormal levels of other serum enzymes: Secondary | ICD-10-CM

## 2019-03-12 DIAGNOSIS — R932 Abnormal findings on diagnostic imaging of liver and biliary tract: Secondary | ICD-10-CM | POA: Diagnosis not present

## 2019-03-12 DIAGNOSIS — B159 Hepatitis A without hepatic coma: Secondary | ICD-10-CM

## 2019-03-12 LAB — COMPREHENSIVE METABOLIC PANEL
ALT: 276 U/L — ABNORMAL HIGH (ref 0–53)
AST: 593 U/L — ABNORMAL HIGH (ref 0–37)
Albumin: 2.5 g/dL — ABNORMAL LOW (ref 3.5–5.2)
Alkaline Phosphatase: 359 U/L — ABNORMAL HIGH (ref 39–117)
BUN: 11 mg/dL (ref 6–23)
CO2: 23 mEq/L (ref 19–32)
Calcium: 8.4 mg/dL (ref 8.4–10.5)
Chloride: 101 mEq/L (ref 96–112)
Creatinine, Ser: 0.86 mg/dL (ref 0.40–1.50)
GFR: 87.02 mL/min (ref >60.00)
Glucose, Bld: 85 mg/dL (ref 70–99)
Potassium: 3.9 mEq/L (ref 3.5–5.1)
Sodium: 131 mEq/L — ABNORMAL LOW (ref 135–145)
Total Bilirubin: 7.4 mg/dL — ABNORMAL HIGH (ref 0.2–1.2)
Total Protein: 7 g/dL (ref 6.0–8.3)

## 2019-03-12 LAB — CBC WITH DIFFERENTIAL/PLATELET
Basophils Absolute: 0 10*3/uL (ref 0.0–0.1)
Basophils Relative: 1.2 % (ref 0.0–3.0)
Eosinophils Absolute: 0.1 10*3/uL (ref 0.0–0.7)
Eosinophils Relative: 3.7 % (ref 0.0–5.0)
HCT: 32.3 % — ABNORMAL LOW (ref 39.0–52.0)
Hemoglobin: 11.4 g/dL — ABNORMAL LOW (ref 13.0–17.0)
Lymphocytes Relative: 27.7 % (ref 12.0–46.0)
Lymphs Abs: 0.6 10*3/uL — ABNORMAL LOW (ref 0.7–4.0)
MCHC: 35.3 g/dL (ref 30.0–36.0)
MCV: 108.4 fl — ABNORMAL HIGH (ref 78.0–100.0)
Monocytes Absolute: 0.3 10*3/uL (ref 0.1–1.0)
Monocytes Relative: 14.8 % — ABNORMAL HIGH (ref 3.0–12.0)
Neutro Abs: 1.2 10*3/uL — ABNORMAL LOW (ref 1.4–7.7)
Neutrophils Relative %: 52.6 % (ref 43.0–77.0)
Platelets: 79 10*3/uL — ABNORMAL LOW (ref 150.0–400.0)
RBC: 2.98 Mil/uL — ABNORMAL LOW (ref 4.22–5.81)
RDW: 16.1 % — ABNORMAL HIGH (ref 11.5–15.5)
WBC: 2.2 10*3/uL — ABNORMAL LOW (ref 4.0–10.5)

## 2019-03-12 LAB — PROTIME-INR
INR: 1.6 ratio — ABNORMAL HIGH (ref 0.8–1.0)
Prothrombin Time: 18.1 s — ABNORMAL HIGH (ref 9.6–13.1)

## 2019-03-13 LAB — CK, TOTAL(REFL): Total CK: 46 U/L (ref 44–196)

## 2019-03-20 ENCOUNTER — Other Ambulatory Visit (INDEPENDENT_AMBULATORY_CARE_PROVIDER_SITE_OTHER): Payer: Medicare Other

## 2019-03-20 DIAGNOSIS — R932 Abnormal findings on diagnostic imaging of liver and biliary tract: Secondary | ICD-10-CM | POA: Diagnosis not present

## 2019-03-20 DIAGNOSIS — B159 Hepatitis A without hepatic coma: Secondary | ICD-10-CM

## 2019-03-20 DIAGNOSIS — R748 Abnormal levels of other serum enzymes: Secondary | ICD-10-CM | POA: Diagnosis not present

## 2019-03-20 LAB — COMPREHENSIVE METABOLIC PANEL
ALT: 236 U/L — ABNORMAL HIGH (ref 0–53)
AST: 529 U/L — ABNORMAL HIGH (ref 0–37)
Albumin: 2.5 g/dL — ABNORMAL LOW (ref 3.5–5.2)
Alkaline Phosphatase: 316 U/L — ABNORMAL HIGH (ref 39–117)
BUN: 12 mg/dL (ref 6–23)
CO2: 23 mEq/L (ref 19–32)
Calcium: 8.4 mg/dL (ref 8.4–10.5)
Chloride: 101 mEq/L (ref 96–112)
Creatinine, Ser: 0.89 mg/dL (ref 0.40–1.50)
GFR: 83.64 mL/min (ref >60.00)
Glucose, Bld: 87 mg/dL (ref 70–99)
Potassium: 4 mEq/L (ref 3.5–5.1)
Sodium: 132 mEq/L — ABNORMAL LOW (ref 135–145)
Total Bilirubin: 6.7 mg/dL — ABNORMAL HIGH (ref 0.2–1.2)
Total Protein: 7.2 g/dL (ref 6.0–8.3)

## 2019-03-20 LAB — CBC WITH DIFFERENTIAL/PLATELET
Basophils Absolute: 0 10*3/uL (ref 0.0–0.1)
Basophils Relative: 1.5 % (ref 0.0–3.0)
Eosinophils Absolute: 0.1 10*3/uL (ref 0.0–0.7)
Eosinophils Relative: 2.6 % (ref 0.0–5.0)
HCT: 32.8 % — ABNORMAL LOW (ref 39.0–52.0)
Hemoglobin: 11.5 g/dL — ABNORMAL LOW (ref 13.0–17.0)
Lymphocytes Relative: 27.8 % (ref 12.0–46.0)
Lymphs Abs: 0.7 10*3/uL (ref 0.7–4.0)
MCHC: 35 g/dL (ref 30.0–36.0)
MCV: 109 fl — ABNORMAL HIGH (ref 78.0–100.0)
Monocytes Absolute: 0.3 10*3/uL (ref 0.1–1.0)
Monocytes Relative: 13.9 % — ABNORMAL HIGH (ref 3.0–12.0)
Neutro Abs: 1.3 10*3/uL — ABNORMAL LOW (ref 1.4–7.7)
Neutrophils Relative %: 54.2 % (ref 43.0–77.0)
Platelets: 85 10*3/uL — ABNORMAL LOW (ref 150.0–400.0)
RBC: 3.01 Mil/uL — ABNORMAL LOW (ref 4.22–5.81)
RDW: 15.8 % — ABNORMAL HIGH (ref 11.5–15.5)
WBC: 2.4 10*3/uL — ABNORMAL LOW (ref 4.0–10.5)

## 2019-03-20 LAB — PROTIME-INR
INR: 1.5 ratio — ABNORMAL HIGH (ref 0.8–1.0)
Prothrombin Time: 17.7 s — ABNORMAL HIGH (ref 9.6–13.1)

## 2019-03-21 LAB — CK, TOTAL(REFL): Total CK: 32 U/L — ABNORMAL LOW (ref 44–196)

## 2019-03-26 ENCOUNTER — Other Ambulatory Visit (INDEPENDENT_AMBULATORY_CARE_PROVIDER_SITE_OTHER): Payer: Medicare Other

## 2019-03-26 ENCOUNTER — Other Ambulatory Visit: Payer: Self-pay | Admitting: Emergency Medicine

## 2019-03-26 DIAGNOSIS — B159 Hepatitis A without hepatic coma: Secondary | ICD-10-CM

## 2019-03-26 DIAGNOSIS — E871 Hypo-osmolality and hyponatremia: Secondary | ICD-10-CM

## 2019-03-26 LAB — CBC WITH DIFFERENTIAL/PLATELET
Basophils Absolute: 0 10*3/uL (ref 0.0–0.1)
Basophils Relative: 0.9 % (ref 0.0–3.0)
Eosinophils Absolute: 0.1 10*3/uL (ref 0.0–0.7)
Eosinophils Relative: 2.7 % (ref 0.0–5.0)
HCT: 31.4 % — ABNORMAL LOW (ref 39.0–52.0)
Hemoglobin: 11.2 g/dL — ABNORMAL LOW (ref 13.0–17.0)
Lymphocytes Relative: 32.6 % (ref 12.0–46.0)
Lymphs Abs: 0.7 10*3/uL (ref 0.7–4.0)
MCHC: 35.7 g/dL (ref 30.0–36.0)
MCV: 108.6 fl — ABNORMAL HIGH (ref 78.0–100.0)
Monocytes Absolute: 0.2 10*3/uL (ref 0.1–1.0)
Monocytes Relative: 11.7 % (ref 3.0–12.0)
Neutro Abs: 1.1 10*3/uL — ABNORMAL LOW (ref 1.4–7.7)
Neutrophils Relative %: 52.1 % (ref 43.0–77.0)
Platelets: 81 10*3/uL — ABNORMAL LOW (ref 150.0–400.0)
RBC: 2.89 Mil/uL — ABNORMAL LOW (ref 4.22–5.81)
RDW: 15.2 % (ref 11.5–15.5)
WBC: 2 10*3/uL — ABNORMAL LOW (ref 4.0–10.5)

## 2019-03-26 LAB — PROTIME-INR
INR: 1.5 ratio — ABNORMAL HIGH (ref 0.8–1.0)
Prothrombin Time: 17.5 s — ABNORMAL HIGH (ref 9.6–13.1)

## 2019-03-26 LAB — COMPREHENSIVE METABOLIC PANEL
ALT: 195 U/L — ABNORMAL HIGH (ref 0–53)
AST: 447 U/L — ABNORMAL HIGH (ref 0–37)
Albumin: 2.4 g/dL — ABNORMAL LOW (ref 3.5–5.2)
Alkaline Phosphatase: 310 U/L — ABNORMAL HIGH (ref 39–117)
BUN: 11 mg/dL (ref 6–23)
CO2: 26 mEq/L (ref 19–32)
Calcium: 8.4 mg/dL (ref 8.4–10.5)
Chloride: 101 mEq/L (ref 96–112)
Creatinine, Ser: 0.85 mg/dL (ref 0.40–1.50)
GFR: 88.19 mL/min (ref >60.00)
Glucose, Bld: 85 mg/dL (ref 70–99)
Potassium: 3.9 mEq/L (ref 3.5–5.1)
Sodium: 132 mEq/L — ABNORMAL LOW (ref 135–145)
Total Bilirubin: 5.6 mg/dL — ABNORMAL HIGH (ref 0.2–1.2)
Total Protein: 6.9 g/dL (ref 6.0–8.3)

## 2019-03-26 LAB — CK, TOTAL(REFL): Total CK: 21 U/L — ABNORMAL LOW (ref 44–196)

## 2019-03-27 ENCOUNTER — Ambulatory Visit (INDEPENDENT_AMBULATORY_CARE_PROVIDER_SITE_OTHER): Payer: Medicare Other | Admitting: Family Medicine

## 2019-03-27 ENCOUNTER — Other Ambulatory Visit: Payer: Self-pay

## 2019-03-27 ENCOUNTER — Encounter: Payer: Self-pay | Admitting: Family Medicine

## 2019-03-27 ENCOUNTER — Other Ambulatory Visit: Payer: Self-pay | Admitting: Emergency Medicine

## 2019-03-27 VITALS — BP 118/74 | HR 62 | Temp 97.8°F | Resp 16 | Ht 73.0 in | Wt 228.8 lb

## 2019-03-27 DIAGNOSIS — B159 Hepatitis A without hepatic coma: Secondary | ICD-10-CM

## 2019-03-27 DIAGNOSIS — E222 Syndrome of inappropriate secretion of antidiuretic hormone: Secondary | ICD-10-CM

## 2019-03-27 DIAGNOSIS — M6282 Rhabdomyolysis: Secondary | ICD-10-CM

## 2019-03-27 DIAGNOSIS — R600 Localized edema: Secondary | ICD-10-CM

## 2019-03-27 DIAGNOSIS — D649 Anemia, unspecified: Secondary | ICD-10-CM

## 2019-03-27 DIAGNOSIS — D61818 Other pancytopenia: Secondary | ICD-10-CM

## 2019-03-27 DIAGNOSIS — E871 Hypo-osmolality and hyponatremia: Secondary | ICD-10-CM | POA: Diagnosis not present

## 2019-03-27 MED ORDER — FOLIC ACID 1 MG PO TABS: 1.0000 mg | ORAL_TABLET | Freq: Every day | ORAL | 3 refills | Status: AC

## 2019-03-27 NOTE — Patient Instructions (Signed)
Please return in July for your physical as scheduled.   No changes were made today and I have refilled your folic acid.   If you have any questions or concerns, please don't hesitate to send me a message via MyChart or call the office at [phone]. Thank you for visiting with [place] today! It's our pleasure caring for you.

## 2019-03-27 NOTE — Progress Notes (Signed)
Subjective  CC:  Chief Complaint  Patient presents with  . Follow-up    HPI: Timothy Grant is a 73 y.o. male who presents to the office today to address the problems listed above in the chief complaint.  Hepatitis A f/u: reviewed chart. Labs are stabilizing but not yet normalizing. Pt feeling much better. Less fatigue. Weight is stable. Less edema. No abdominal pain. Still with belching. No bruising or bleeding. Jaundice is much improved.   H/o rhabdo: now resolved. No longer with mm pain nor weakness. Was due to intravascular volume depletion in the setting of statin use. Holding statin.   SIADH and hyponatremia: sodium is improved. No longer on diuretics nor fluid restriction. No longer on salt tablets .  LE edema: improved. Albumin remains low.   Pancytopenia: has had chronic leukopenia (never formally evaluated) but now with decreased platelets and hgb as well. Macrocytic. No bleeding. No sxs of infection.    BP Readings from Last 3 Encounters:  03/27/19 118/74  02/16/19 122/76  02/14/19 104/68   Wt Readings from Last 3 Encounters:  03/27/19 228 lb 12.8 oz (103.8 kg)  02/16/19 247 lb 9.6 oz (112.3 kg)  02/14/19 245 lb 2 oz (111.2 kg)    Assessment  1. Viral hepatitis A without hepatic coma   2. SIADH (syndrome of inappropriate ADH production) ([place])   3. Hyponatremia   4. Bilateral lower extremity edema   5. Non-traumatic rhabdomyolysis      Plan   Hepatits A:   Per GI: improving slowly.   SIADH and hyponatremia: improved. Continue to monitor. Add back aldactone if worsening again.   LE edema: improved. Continue to monitor. Add back aldactone if worsening again. Due to low albumin and 3rd spacing from liver insufficiency and portal htn.   Rhabdo: resolved. Keep hydrated and hold statin and liver toxins. Can stop checking ck's.   Pancytopenia: ? Related to volume changes or autoimmune or other. Will refer to heme. Continue to monitor for infection, bleeding  and weekly cbc.  Follow up: Return in about 3 months (around 06/26/2019) for recheck.  06/21/2019 for cpe   No orders of the defined types were placed in this encounter.  No orders of the defined types were placed in this encounter.     I reviewed the patients updated PMH, FH, and SocHx.    Patient Active Problem List   Diagnosis Date Noted  . Acquired hypothyroidism 02/05/2011    Priority: High  . Adenomatous colon polyp 02/05/2011    Priority: High  . Mixed hyperlipidemia 02/05/2011    Priority: High  . Osteoarthritis of finger of left hand 01/07/2017    Priority: Medium  . Leukopenia 08/09/2012    Priority: Medium  . Trigger finger, left ring finger 01/07/2017    Priority: Low  . Male erectile disorder 02/09/2012    Priority: Low   Current Meds  Medication Sig  . acetaminophen (TYLENOL) 325 MG tablet Take 650 mg by mouth as needed.  . Cholecalciferol (VITAMIN D3) 5000 units TABS Take 1 tablet by mouth daily.   . cyclobenzaprine (FLEXERIL) 10 MG tablet Take 1 tablet (10 mg total) by mouth as needed.  . folic acid (FOLVITE) 1 MG tablet Take 1 tablet by mouth daily.  . Glucosamine HCl 1000 MG TABS Take 2 tablets by mouth daily.  Marland Kitchen levothyroxine (SYNTHROID, LEVOTHROID) 150 MCG tablet Take 1 tablet (150 mcg total) by mouth daily before breakfast.  . Multiple Vitamin (MULTIVITAMIN) capsule Take 1 capsule  by mouth daily.   . Omega-3 1000 MG CAPS Take 1 tablet by mouth daily.  . sildenafil (REVATIO) 20 MG tablet Use 5 tablets PO as needed    Allergies: Patient has No Known Allergies. Family History: Patient family history includes Alzheimer's disease in his mother; Arthritis in his mother; Cancer in his brother and sister; Dementia in his mother; Heart disease in his father and paternal grandmother; Hyperlipidemia in his brother; Stroke in his brother and mother. Social History:  Patient  reports that he has quit smoking. He has never used smokeless tobacco. He reports  previous alcohol use. He reports that he does not use drugs.  Review of Systems: Constitutional: Negative for fever malaise or anorexia Cardiovascular: negative for chest pain Respiratory: negative for SOB or persistent cough Gastrointestinal: negative for abdominal pain Wt Readings from Last 3 Encounters:  03/27/19 228 lb 12.8 oz (103.8 kg)  02/16/19 247 lb 9.6 oz (112.3 kg)  02/14/19 245 lb 2 oz (111.2 kg)    Objective  Vitals: BP 118/74   Pulse 62   Temp 97.8 F (36.6 C) (Oral)   Resp 16   Ht 6\' 1"  (1.854 m)   Wt 228 lb 12.8 oz (103.8 kg)   SpO2 96%   BMI 30.19 kg/m  General: no acute distress , A&Ox3 HEENT: PEERL, conjunctiva normal, Oropharynx moist,neck is supple Cardiovascular:  RRR without murmur or gallop.  Respiratory:  Good breath sounds bilaterally, CTAB with normal respiratory effort Gastrointestinal: soft, flat abdomen, normal active bowel sounds, no palpable masses, no hepatosplenomegaly, no appreciated hernias Ext: tr edema bilaterall Skin:  Warm, no rashes  No visits with results within 1 Day(s) from this visit.  Latest known visit with results is:  Appointment on 03/26/2019  Component Date Value Ref Range Status  . Total CK 03/26/2019 21* 44 - 196 U/L Final  . Sodium 03/26/2019 132* 135 - 145 mEq/L Final  . Potassium 03/26/2019 3.9  3.5 - 5.1 mEq/L Final  . Chloride 03/26/2019 101  96 - 112 mEq/L Final  . CO2 03/26/2019 26  19 - 32 mEq/L Final  . Glucose, Bld 03/26/2019 85  70 - 99 mg/dL Final  . BUN 03/26/2019 11  6 - 23 mg/dL Final  . Creatinine, Ser 03/26/2019 0.85  0.40 - 1.50 mg/dL Final  . Total Bilirubin 03/26/2019 5.6* 0.2 - 1.2 mg/dL Final  . Alkaline Phosphatase 03/26/2019 310* 39 - 117 U/L Final  . AST 03/26/2019 447* 0 - 37 U/L Final  . ALT 03/26/2019 195* 0 - 53 U/L Final  . Total Protein 03/26/2019 6.9  6.0 - 8.3 g/dL Final  . Albumin 03/26/2019 2.4* 3.5 - 5.2 g/dL Final  . Calcium 03/26/2019 8.4  8.4 - 10.5 mg/dL Final  . GFR  03/26/2019 88.19  >60.00 mL/min Final  . WBC 03/26/2019 2.0 Repeated and verified X2.* 4.0 - 10.5 K/uL Final  . RBC 03/26/2019 2.89* 4.22 - 5.81 Mil/uL Final  . Hemoglobin 03/26/2019 11.2* 13.0 - 17.0 g/dL Final  . HCT 03/26/2019 31.4* 39.0 - 52.0 % Final  . MCV 03/26/2019 108.6* 78.0 - 100.0 fl Final  . MCHC 03/26/2019 35.7  30.0 - 36.0 g/dL Final  . RDW 03/26/2019 15.2  11.5 - 15.5 % Final  . Platelets 03/26/2019 81.0* 150.0 - 400.0 K/uL Final  . Neutrophils Relative % 03/26/2019 52.1  43.0 - 77.0 % Final  . Lymphocytes Relative 03/26/2019 32.6  12.0 - 46.0 % Final  . Monocytes Relative 03/26/2019 11.7  3.0 -  12.0 % Final  . Eosinophils Relative 03/26/2019 2.7  0.0 - 5.0 % Final  . Basophils Relative 03/26/2019 0.9  0.0 - 3.0 % Final  . Neutro Abs 03/26/2019 1.1* 1.4 - 7.7 K/uL Final  . Lymphs Abs 03/26/2019 0.7  0.7 - 4.0 K/uL Final  . Monocytes Absolute 03/26/2019 0.2  0.1 - 1.0 K/uL Final  . Eosinophils Absolute 03/26/2019 0.1  0.0 - 0.7 K/uL Final  . Basophils Absolute 03/26/2019 0.0  0.0 - 0.1 K/uL Final  . INR 03/26/2019 1.5* 0.8 - 1.0 ratio Final  . Prothrombin Time 03/26/2019 17.5* 9.6 - 13.1 sec Final   Corrected calcium 9.7    Commons side effects, risks, benefits, and alternatives for medications and treatment plan prescribed today were discussed, and the patient expressed understanding of the given instructions. Patient is instructed to call or message via MyChart if he/she has any questions or concerns regarding our treatment plan. No barriers to understanding were identified. We discussed Red Flag symptoms and signs in detail. Patient expressed understanding regarding what to do in case of urgent or emergency type symptoms.   Medication list was reconciled, printed and provided to the patient in AVS. Patient instructions and summary information was reviewed with the patient as documented in the AVS. This note was prepared with assistance of Dragon voice recognition  software. Occasional wrong-word or sound-a-like substitutions may have occurred due to the inherent limitations of voice recognition software

## 2019-03-28 ENCOUNTER — Telehealth: Payer: Self-pay | Admitting: Internal Medicine

## 2019-03-28 ENCOUNTER — Encounter: Payer: Medicare Other | Admitting: Family Medicine

## 2019-03-28 ENCOUNTER — Ambulatory Visit: Payer: Medicare Other

## 2019-03-28 NOTE — Telephone Encounter (Signed)
A new hem appt has been scheduled for the pt to see Dr. Walden Field on 5/8 at 1050am. Pt aware to arrive 15 minutes early.

## 2019-04-02 ENCOUNTER — Other Ambulatory Visit: Payer: Self-pay | Admitting: Gastroenterology

## 2019-04-02 ENCOUNTER — Other Ambulatory Visit (INDEPENDENT_AMBULATORY_CARE_PROVIDER_SITE_OTHER): Payer: Medicare Other

## 2019-04-02 DIAGNOSIS — B159 Hepatitis A without hepatic coma: Secondary | ICD-10-CM | POA: Diagnosis not present

## 2019-04-02 LAB — COMPREHENSIVE METABOLIC PANEL
ALT: 162 U/L — ABNORMAL HIGH (ref 0–53)
AST: 354 U/L — ABNORMAL HIGH (ref 0–37)
Albumin: 2.5 g/dL — ABNORMAL LOW (ref 3.5–5.2)
Alkaline Phosphatase: 297 U/L — ABNORMAL HIGH (ref 39–117)
BUN: 10 mg/dL (ref 6–23)
CO2: 24 mEq/L (ref 19–32)
Calcium: 8.9 mg/dL (ref 8.4–10.5)
Chloride: 99 mEq/L (ref 96–112)
Creatinine, Ser: 0.85 mg/dL (ref 0.40–1.50)
GFR: 88.19 mL/min (ref >60.00)
Glucose, Bld: 112 mg/dL — ABNORMAL HIGH (ref 70–99)
Potassium: 3.9 mEq/L (ref 3.5–5.1)
Sodium: 129 mEq/L — ABNORMAL LOW (ref 135–145)
Total Bilirubin: 5.1 mg/dL — ABNORMAL HIGH (ref 0.2–1.2)
Total Protein: 7.1 g/dL (ref 6.0–8.3)

## 2019-04-02 LAB — CBC WITH DIFFERENTIAL/PLATELET
Basophils Absolute: 0 10*3/uL (ref 0.0–0.1)
Basophils Relative: 1.1 % (ref 0.0–3.0)
Eosinophils Absolute: 0.1 10*3/uL (ref 0.0–0.7)
Eosinophils Relative: 2.3 % (ref 0.0–5.0)
HCT: 32.6 % — ABNORMAL LOW (ref 39.0–52.0)
Hemoglobin: 11.5 g/dL — ABNORMAL LOW (ref 13.0–17.0)
Lymphocytes Relative: 31.7 % (ref 12.0–46.0)
Lymphs Abs: 0.8 10*3/uL (ref 0.7–4.0)
MCHC: 35.3 g/dL (ref 30.0–36.0)
MCV: 109.6 fl — ABNORMAL HIGH (ref 78.0–100.0)
Monocytes Absolute: 0.3 10*3/uL (ref 0.1–1.0)
Monocytes Relative: 13.2 % — ABNORMAL HIGH (ref 3.0–12.0)
Neutro Abs: 1.3 10*3/uL — ABNORMAL LOW (ref 1.4–7.7)
Neutrophils Relative %: 51.7 % (ref 43.0–77.0)
Platelets: 98 10*3/uL — ABNORMAL LOW (ref 150.0–400.0)
RBC: 2.97 Mil/uL — ABNORMAL LOW (ref 4.22–5.81)
RDW: 15 % (ref 11.5–15.5)
WBC: 2.5 10*3/uL — ABNORMAL LOW (ref 4.0–10.5)

## 2019-04-02 LAB — PROTIME-INR
INR: 1.4 ratio — ABNORMAL HIGH (ref 0.8–1.0)
Prothrombin Time: 16.8 s — ABNORMAL HIGH (ref 9.6–13.1)

## 2019-04-09 ENCOUNTER — Telehealth: Payer: Self-pay | Admitting: Family Medicine

## 2019-04-09 ENCOUNTER — Other Ambulatory Visit: Payer: Self-pay | Admitting: Gastroenterology

## 2019-04-09 ENCOUNTER — Other Ambulatory Visit (INDEPENDENT_AMBULATORY_CARE_PROVIDER_SITE_OTHER): Payer: Medicare Other

## 2019-04-09 ENCOUNTER — Other Ambulatory Visit: Payer: Self-pay | Admitting: *Deleted

## 2019-04-09 DIAGNOSIS — B159 Hepatitis A without hepatic coma: Secondary | ICD-10-CM

## 2019-04-09 LAB — COMPREHENSIVE METABOLIC PANEL
ALT: 147 U/L — ABNORMAL HIGH (ref 0–53)
AST: 324 U/L — ABNORMAL HIGH (ref 0–37)
Albumin: 2.5 g/dL — ABNORMAL LOW (ref 3.5–5.2)
Alkaline Phosphatase: 266 U/L — ABNORMAL HIGH (ref 39–117)
BUN: 15 mg/dL (ref 6–23)
CO2: 22 mEq/L (ref 19–32)
Calcium: 8.2 mg/dL — ABNORMAL LOW (ref 8.4–10.5)
Chloride: 100 mEq/L (ref 96–112)
Creatinine, Ser: 0.71 mg/dL (ref 0.40–1.50)
GFR: 108.54 mL/min (ref >60.00)
Glucose, Bld: 92 mg/dL (ref 70–99)
Potassium: 4 mEq/L (ref 3.5–5.1)
Sodium: 128 mEq/L — ABNORMAL LOW (ref 135–145)
Total Bilirubin: 4.2 mg/dL — ABNORMAL HIGH (ref 0.2–1.2)
Total Protein: 7.1 g/dL (ref 6.0–8.3)

## 2019-04-09 LAB — CBC WITH DIFFERENTIAL/PLATELET
Basophils Absolute: 0 10*3/uL (ref 0.0–0.1)
Basophils Relative: 1.1 % (ref 0.0–3.0)
Eosinophils Absolute: 0.1 10*3/uL (ref 0.0–0.7)
Eosinophils Relative: 2.2 % (ref 0.0–5.0)
HCT: 32.4 % — ABNORMAL LOW (ref 39.0–52.0)
Hemoglobin: 11.5 g/dL — ABNORMAL LOW (ref 13.0–17.0)
Lymphocytes Relative: 25.2 % (ref 12.0–46.0)
Lymphs Abs: 0.6 10*3/uL — ABNORMAL LOW (ref 0.7–4.0)
MCHC: 35.6 g/dL (ref 30.0–36.0)
MCV: 108.6 fl — ABNORMAL HIGH (ref 78.0–100.0)
Monocytes Absolute: 0.3 10*3/uL (ref 0.1–1.0)
Monocytes Relative: 13.1 % — ABNORMAL HIGH (ref 3.0–12.0)
Neutro Abs: 1.5 10*3/uL (ref 1.4–7.7)
Neutrophils Relative %: 58.4 % (ref 43.0–77.0)
Platelets: 102 10*3/uL — ABNORMAL LOW (ref 150.0–400.0)
RBC: 2.98 Mil/uL — ABNORMAL LOW (ref 4.22–5.81)
RDW: 14 % (ref 11.5–15.5)
WBC: 2.5 10*3/uL — ABNORMAL LOW (ref 4.0–10.5)

## 2019-04-09 LAB — PROTIME-INR
INR: 1.4 ratio — ABNORMAL HIGH (ref 0.8–1.0)
Prothrombin Time: 16.1 s — ABNORMAL HIGH (ref 9.6–13.1)

## 2019-04-09 NOTE — Telephone Encounter (Signed)
Pt aware and verbalized understanding.  

## 2019-04-09 NOTE — Telephone Encounter (Signed)
See note

## 2019-04-09 NOTE — Telephone Encounter (Signed)
Copied from [place] [id number]. Topic: General - Other >> Apr 09, 2019  1:44 PM Percell Belt A wrote: Reason for CRM: pt called in and wanted to fu with DR Jonni Sanger about lab results he rec'd from Dr Tarri Glenn office today. Please advise   Best number  -  [phone]

## 2019-04-09 NOTE — Telephone Encounter (Signed)
See note  Copied from [place] [id number]. Topic: General - Other >> Apr 09, 2019  1:44 PM Percell Belt A wrote: Reason for CRM: pt called in and wanted to fu with DR Jonni Sanger about lab results he rec'd from Dr Tarri Glenn office today. Please advise   Best number  -  [phone]

## 2019-04-09 NOTE — Telephone Encounter (Signed)
Please call patient.  His labs show his sodium remains low.  I recommend restarting the sodium tablet daily and restricting fluid to 1557ml/day.  We will recheck in 2 weeks at Dr. Tarri Glenn office.  If sodium is trending downward, will need to restart the fluid pills.   Thanks.

## 2019-04-09 NOTE — Telephone Encounter (Signed)
Pt given advise from Dr. Jonni Sanger about labs.

## 2019-04-13 ENCOUNTER — Other Ambulatory Visit: Payer: Self-pay

## 2019-04-13 ENCOUNTER — Encounter: Payer: Self-pay | Admitting: Internal Medicine

## 2019-04-13 ENCOUNTER — Inpatient Hospital Stay: Payer: Medicare Other | Attending: Internal Medicine | Admitting: Internal Medicine

## 2019-04-13 VITALS — BP 107/71 | HR 87 | Temp 97.8°F | Resp 18 | Ht 73.0 in | Wt 229.1 lb

## 2019-04-13 DIAGNOSIS — Z87891 Personal history of nicotine dependence: Secondary | ICD-10-CM

## 2019-04-13 DIAGNOSIS — E039 Hypothyroidism, unspecified: Secondary | ICD-10-CM | POA: Diagnosis not present

## 2019-04-13 DIAGNOSIS — R7989 Other specified abnormal findings of blood chemistry: Secondary | ICD-10-CM

## 2019-04-13 DIAGNOSIS — R945 Abnormal results of liver function studies: Secondary | ICD-10-CM | POA: Insufficient documentation

## 2019-04-13 DIAGNOSIS — B179 Acute viral hepatitis, unspecified: Secondary | ICD-10-CM | POA: Diagnosis not present

## 2019-04-13 DIAGNOSIS — D72818 Other decreased white blood cell count: Secondary | ICD-10-CM | POA: Diagnosis not present

## 2019-04-13 DIAGNOSIS — Z79899 Other long term (current) drug therapy: Secondary | ICD-10-CM | POA: Insufficient documentation

## 2019-04-13 NOTE — Progress Notes (Signed)
Referring Physician:  Karie Fetch. Jonni Sanger, MD  Diagnosis Other decreased white blood cell (WBC) count - Plan: CBC with Differential ([place] Only), CMP ([place] only), Lactate dehydrogenase (LDH), Ferritin, Iron and TIBC, Hemochromatosis DNA, PCR  Abnormal liver function tests - Plan: CBC with Differential ([place] Only), CMP ([place] only), Lactate dehydrogenase (LDH), Ferritin, Iron and TIBC, Hemochromatosis DNA, PCR  Staging Cancer Staging No matching staging information was found for the patient.  Assessment and Plan:  1.  Leukopenia and thrombocytopenia.  73 year old male who is referred for evaluation due to leukopenia and thrombocytopenia.  Pt is followed by Dr. Tarri Glenn of GI.  He reports exposure to Hepatitis A through an acquaintance.  He was also on Crestor.  He was seen by Dr. Jonni Sanger and also diagnosed with hepatitis A.  He was recommended to discontinue Crestor.    Labs done 01/03/2019 showed a white count of 2.9 hemoglobin 12.1 and platelets 135,000.  Labs done January 10, 2019 showed an AST greater than 1000 and ALT of 820 bilirubin 17.  Liver function tests remained elevated.  Most recent labs done on 04/09/2019 showed a white count 2.5 hemoglobin 11.1 platelets 102,000.  He had a normal differential.  Chemistries showed a creatinine of 0.71 potassium 4 AST was 324 ALT was 147 bilirubin 4.2.  PT was 16.    Patient had MRI and MRCP done on January 11, 2019 that showed IMPRESSION: 1. Periportal edema with some subtle delayed periportal enhancement. Although not entirely specific, this pattern is often encountered in the setting of acute hepatitis. 2. Mild-to-moderate gallbladder wall thickening, which may be secondary to the patient's hypoalbuminemia although inflammation is not readily excluded. 3. Trace perihepatic ascites and perisplenic ascites. Small amount of fluid around the descending duodenum is probably incidental and less likely from duodenitis. 4. The  biliary tree appears otherwise unremarkable.  He continues to be followed by GI.  Patient is followed by Dr. Manuella Ghazi at [facility] liver transplant clinic.  Pt had liver biopsy done on 01/24/2019 at [facility] that showed  A: Liver, core needle biopsy - Severe acute hepatitis with focal areas of confluent necrosis (see microscopic description and comment) - Minimal to mild early portal/periportal fibrosis - In situ hybridization for EBV virus encoded RNA is negative    Patient is seen today for consultation due to leukopenia and thrombocytopenia.  Long talk with the patient today.  I discussed with him CBC findings correlate with the significant elevation in his liver function tests which are likely contributing to the blood count findings.  I discussed with him he will continue to need ongoing monitoring of his liver function test through GI to evaluate for any evidence of improvement.  The patient will return to clinic in June 2020 with repeat labs.  We will check iron studies and hemochromatosis labs at that time.  Will discuss with transplant team if they desire further work-up for confirmation such as bone marrow biopsy.    2.  Abnormal liver function tests.  Recent labs on 04/09/2019 showed an AST of 324 ALT of 147.  Bilirubin was 4.  Patient is followed by low our GI as well as [facility] with Dr. Manuella Ghazi.  He should continue to follow-up with GI as recommended.  Patient should avoid Tylenol and cholesterol medications until LFTs improved.  Will check hemochromatosis and iron studies on return to clinic.  3.  Hepatitis A.  Patient should follow-up with GI and PCP as recommended.  4.  Hypothyroidism.  Patient is on Synthroid.  40 minutes spent with more than 50% spent in review of records, counseling and coordination of care.    HPI:  72 year old male who is referred for evaluation due to leukopenia and thrombocytopenia.  Pt is followed by Dr. Tarri Glenn of GI.  He reports exposure to Hepatitis A through an acquaintance.   He was also on Crestor.  He was seen by Dr. Jonni Sanger and also diagnosed with hepatitis A.  He was recommended to discontinue Crestor.    Labs done 01/03/2019 showed a white count of 2.9 hemoglobin 12.1 and platelets 135,000.  Labs done January 10, 2019 showed an AST greater than 1000 and ALT of 820 bilirubin 17.  Liver function tests remained elevated.  Most recent labs done on 04/09/2019 showed a white count 2.5 hemoglobin 11.1 platelets 102,000.  He had a normal differential.  Chemistries showed a creatinine of 0.71 potassium 4 AST was 324 ALT was 147 bilirubin 4.2.  PT was 16.    Patient had MRI and MRCP done on January 11, 2019 that showed IMPRESSION: 1. Periportal edema with some subtle delayed periportal enhancement. Although not entirely specific, this pattern is often encountered in the setting of acute hepatitis. 2. Mild-to-moderate gallbladder wall thickening, which may be secondary to the patient's hypoalbuminemia although inflammation is not readily excluded. 3. Trace perihepatic ascites and perisplenic ascites. Small amount of fluid around the descending duodenum is probably incidental and less likely from duodenitis. 4. The biliary tree appears otherwise unremarkable.   He continues to be followed by GI.  Patient is followed by Dr. Manuella Ghazi at [facility] liver transplant clinic.  Pt had liver biopsy done on 01/24/2019 at [facility] that showed  A: Liver, core needle biopsy - Severe acute hepatitis with focal areas of confluent necrosis (see microscopic description and comment) - Minimal to mild early portal/periportal fibrosis - In situ hybridization for EBV virus encoded RNA is negative     Patient is seen today for consultation due to leukopenia and thrombocytopenia.   Problem List Patient Active Problem List   Diagnosis Date Noted  . Pancytopenia ([place]) [Y17.494] 03/27/2019  . Non-traumatic rhabdomyolysis [M62.82] 02/21/2019  . Osteoarthritis of finger of left hand [M19.042] 01/07/2017  .  Trigger finger, left ring finger [M65.342] 01/07/2017  . Leukopenia [D72.819] 08/09/2012  . Male erectile disorder [N52.9] 02/09/2012  . Acquired hypothyroidism [E03.9] 02/05/2011  . Adenomatous colon polyp [D12.6] 02/05/2011  . Mixed hyperlipidemia [E78.2] 02/05/2011    Past Medical History Past Medical History:  Diagnosis Date  . Adenomatous polyps   . ED (erectile dysfunction)   . Hepatitis A   . Hyperlipidemia   . Hypothyroidism   . Varicocele 02/01/2011   Left testicle    Past Surgical History Past Surgical History:  Procedure Laterality Date  . EYE SURGERY Right 1956  . EYE SURGERY Left 1963  . LIVER BIOPSY    . TONSILLECTOMY      Family History Family History  Problem Relation Age of Onset  . Alzheimer's disease Mother   . Arthritis Mother   . Dementia Mother   . Stroke Mother   . Heart disease Father   . Cancer Sister        uterine  . Hyperlipidemia Brother   . Stroke Brother   . Cancer Brother   . Heart disease Paternal Grandmother   . Colon cancer Neg Hx   . Esophageal cancer Neg Hx      Social History  reports that he has quit smoking. He has never used smokeless tobacco. He reports previous alcohol use. He reports that he does not use drugs.  Medications  Current Outpatient Medications:  .  acetaminophen (TYLENOL) 325 MG tablet, Take 650 mg by mouth as needed., Disp: , Rfl:  .  Cholecalciferol (VITAMIN D3) 5000 units TABS, Take 1 tablet by mouth daily. , Disp: , Rfl:  .  cyclobenzaprine (FLEXERIL) 10 MG tablet, Take 1 tablet (10 mg total) by mouth as needed., Disp: 30 tablet, Rfl: 2 .  folic acid (FOLVITE) 1 MG tablet, Take 1 tablet (1 mg total) by mouth daily., Disp: 90 tablet, Rfl: 3 .  Glucosamine HCl 1000 MG TABS, Take 2 tablets by mouth daily., Disp: , Rfl:  .  levothyroxine (SYNTHROID, LEVOTHROID) 150 MCG tablet, Take 1 tablet (150 mcg total) by mouth daily before breakfast., Disp: 90 tablet, Rfl: 3 .  Multiple Vitamin (MULTIVITAMIN)  capsule, Take 1 capsule by mouth daily. , Disp: , Rfl:  .  Omega-3 1000 MG CAPS, Take 1 tablet by mouth daily., Disp: , Rfl:  .  OVER THE COUNTER MEDICATION, Take 1 g by mouth daily. Salt tablet  OTC, Disp: , Rfl:  .  sildenafil (REVATIO) 20 MG tablet, Use 5 tablets PO as needed, Disp: 100 tablet, Rfl: 3  Allergies Rosuvastatin  Review of Systems Review of Systems - Oncology ROS negative   Physical Exam  Vitals Wt Readings from Last 3 Encounters:  04/13/19 229 lb 1.6 oz (103.9 kg)  03/27/19 228 lb 12.8 oz (103.8 kg)  02/16/19 247 lb 9.6 oz (112.3 kg)   Temp Readings from Last 3 Encounters:  04/13/19 97.8 F (36.6 C) (Oral)  03/27/19 97.8 F (36.6 C) (Oral)  02/16/19 98.2 F (36.8 C) (Oral)   BP Readings from Last 3 Encounters:  04/13/19 107/71  03/27/19 118/74  02/16/19 122/76   Pulse Readings from Last 3 Encounters:  04/13/19 87  03/27/19 62  02/16/19 94   Constitutional: Well-developed, well-nourished, and in no distress.   HENT: Head: Normocephalic and atraumatic.  Mouth/Throat: No oropharyngeal exudate. Mucosa moist. Eyes: Pupils are equal, round, and reactive to light. Conjunctivae are normal. No scleral icterus.  Neck: Normal range of motion. Neck supple. No JVD present.  Cardiovascular: Normal rate, regular rhythm and normal heart sounds.  Exam reveals no gallop and no friction rub.   No murmur heard. Pulmonary/Chest: Effort normal and breath sounds normal. No respiratory distress. No wheezes.No rales.  Abdominal: Soft. Bowel sounds are normal. Mild distension.  No fluid wave noted.  No splenomegaly. There is no tenderness. There is no guarding.  Musculoskeletal: No edema or tenderness.  Lymphadenopathy: No cervical,axillary or supraclavicular adenopathy.  Neurological: Alert and oriented to person, place, and time. No cranial nerve deficit.  Skin: Skin is warm and dry. No rash noted. No erythema. No pallor.  Psychiatric: Affect and judgment normal.    Labs No visits with results within 3 Day(s) from this visit.  Latest known visit with results is:  Appointment on 04/09/2019  Component Date Value Ref Range Status  . INR 04/09/2019 1.4* 0.8 - 1.0 ratio Final  . Prothrombin Time 04/09/2019 16.1* 9.6 - 13.1 sec Final  . Sodium 04/09/2019 128* 135 - 145 mEq/L Final  . Potassium 04/09/2019 4.0  3.5 - 5.1 mEq/L Final  . Chloride 04/09/2019 100  96 - 112 mEq/L Final  . CO2 04/09/2019 22  19 - 32 mEq/L Final  . Glucose, Bld 04/09/2019 92  70 - 99 mg/dL Final  . BUN 04/09/2019 15  6 - 23 mg/dL Final  . Creatinine, Ser 04/09/2019 0.71  0.40 - 1.50 mg/dL Final  . Total Bilirubin 04/09/2019 4.2* 0.2 - 1.2 mg/dL Final  . Alkaline Phosphatase 04/09/2019 266* 39 - 117 U/L Final  . AST 04/09/2019 324* 0 - 37 U/L Final  . ALT 04/09/2019 147* 0 - 53 U/L Final  . Total Protein 04/09/2019 7.1  6.0 - 8.3 g/dL Final  . Albumin 04/09/2019 2.5* 3.5 - 5.2 g/dL Final  . Calcium 04/09/2019 8.2* 8.4 - 10.5 mg/dL Final  . GFR 04/09/2019 108.54  >60.00 mL/min Final  . WBC 04/09/2019 2.5* 4.0 - 10.5 K/uL Final  . RBC 04/09/2019 2.98* 4.22 - 5.81 Mil/uL Final  . Hemoglobin 04/09/2019 11.5* 13.0 - 17.0 g/dL Final  . HCT 04/09/2019 32.4* 39.0 - 52.0 % Final  . MCV 04/09/2019 108.6* 78.0 - 100.0 fl Final  . MCHC 04/09/2019 35.6  30.0 - 36.0 g/dL Final  . RDW 04/09/2019 14.0  11.5 - 15.5 % Final  . Platelets 04/09/2019 102.0* 150.0 - 400.0 K/uL Final  . Neutrophils Relative % 04/09/2019 58.4  43.0 - 77.0 % Final  . Lymphocytes Relative 04/09/2019 25.2  12.0 - 46.0 % Final  . Monocytes Relative 04/09/2019 13.1* 3.0 - 12.0 % Final  . Eosinophils Relative 04/09/2019 2.2  0.0 - 5.0 % Final  . Basophils Relative 04/09/2019 1.1  0.0 - 3.0 % Final  . Neutro Abs 04/09/2019 1.5  1.4 - 7.7 K/uL Final  . Lymphs Abs 04/09/2019 0.6* 0.7 - 4.0 K/uL Final  . Monocytes Absolute 04/09/2019 0.3  0.1 - 1.0 K/uL Final  . Eosinophils Absolute 04/09/2019 0.1  0.0 - 0.7 K/uL Final   . Basophils Absolute 04/09/2019 0.0  0.0 - 0.1 K/uL Final     Pathology Orders Placed This Encounter  Procedures  . CBC with Differential ([facility] Only)    Standing Status:   Future    Standing Expiration Date:   04/12/2020  . CMP ([place] only)    Standing Status:   Future    Standing Expiration Date:   04/12/2020  . Lactate dehydrogenase (LDH)    Standing Status:   Future    Standing Expiration Date:   04/12/2020  . Ferritin    Standing Status:   Future    Standing Expiration Date:   04/12/2020  . Iron and TIBC    Standing Status:   Future    Standing Expiration Date:   04/12/2020  . Hemochromatosis DNA, PCR    Standing Status:   Future    Standing Expiration Date:   04/12/2020       Zoila Shutter MD

## 2019-04-16 ENCOUNTER — Telehealth: Payer: Self-pay | Admitting: Internal Medicine

## 2019-04-16 NOTE — Telephone Encounter (Signed)
Tried to reach regarding schedule °

## 2019-04-23 ENCOUNTER — Other Ambulatory Visit (INDEPENDENT_AMBULATORY_CARE_PROVIDER_SITE_OTHER): Payer: Medicare Other

## 2019-04-23 DIAGNOSIS — B159 Hepatitis A without hepatic coma: Secondary | ICD-10-CM | POA: Diagnosis not present

## 2019-04-23 LAB — CBC WITH DIFFERENTIAL/PLATELET
Basophils Absolute: 0 10*3/uL (ref 0.0–0.1)
Basophils Relative: 1 % (ref 0.0–3.0)
Eosinophils Absolute: 0.1 10*3/uL (ref 0.0–0.7)
Eosinophils Relative: 2.1 % (ref 0.0–5.0)
HCT: 31.6 % — ABNORMAL LOW (ref 39.0–52.0)
Hemoglobin: 11.3 g/dL — ABNORMAL LOW (ref 13.0–17.0)
Lymphocytes Relative: 27.5 % (ref 12.0–46.0)
Lymphs Abs: 0.7 10*3/uL (ref 0.7–4.0)
MCHC: 35.8 g/dL (ref 30.0–36.0)
MCV: 109.1 fl — ABNORMAL HIGH (ref 78.0–100.0)
Monocytes Absolute: 0.3 10*3/uL (ref 0.1–1.0)
Monocytes Relative: 12.6 % — ABNORMAL HIGH (ref 3.0–12.0)
Neutro Abs: 1.5 10*3/uL (ref 1.4–7.7)
Neutrophils Relative %: 56.8 % (ref 43.0–77.0)
Platelets: 108 10*3/uL — ABNORMAL LOW (ref 150.0–400.0)
RBC: 2.9 Mil/uL — ABNORMAL LOW (ref 4.22–5.81)
RDW: 13.3 % (ref 11.5–15.5)
WBC: 2.7 10*3/uL — ABNORMAL LOW (ref 4.0–10.5)

## 2019-04-23 LAB — COMPREHENSIVE METABOLIC PANEL
ALT: 100 U/L — ABNORMAL HIGH (ref 0–53)
AST: 180 U/L — ABNORMAL HIGH (ref 0–37)
Albumin: 2.5 g/dL — ABNORMAL LOW (ref 3.5–5.2)
Alkaline Phosphatase: 204 U/L — ABNORMAL HIGH (ref 39–117)
BUN: 12 mg/dL (ref 6–23)
CO2: 25 mEq/L (ref 19–32)
Calcium: 8.4 mg/dL (ref 8.4–10.5)
Chloride: 102 mEq/L (ref 96–112)
Creatinine, Ser: 0.8 mg/dL (ref 0.40–1.50)
GFR: 94.57 mL/min (ref >60.00)
Glucose, Bld: 97 mg/dL (ref 70–99)
Potassium: 4 mEq/L (ref 3.5–5.1)
Sodium: 131 mEq/L — ABNORMAL LOW (ref 135–145)
Total Bilirubin: 2.8 mg/dL — ABNORMAL HIGH (ref 0.2–1.2)
Total Protein: 7.1 g/dL (ref 6.0–8.3)

## 2019-04-23 LAB — PROTIME-INR
INR: 1.4 ratio — ABNORMAL HIGH (ref 0.8–1.0)
Prothrombin Time: 16.3 s — ABNORMAL HIGH (ref 9.6–13.1)

## 2019-05-07 ENCOUNTER — Other Ambulatory Visit (INDEPENDENT_AMBULATORY_CARE_PROVIDER_SITE_OTHER): Payer: Medicare Other

## 2019-05-07 ENCOUNTER — Other Ambulatory Visit: Payer: Self-pay | Admitting: *Deleted

## 2019-05-07 DIAGNOSIS — B159 Hepatitis A without hepatic coma: Secondary | ICD-10-CM | POA: Diagnosis not present

## 2019-05-07 LAB — COMPREHENSIVE METABOLIC PANEL
ALT: 67 U/L — ABNORMAL HIGH (ref 0–53)
AST: 112 U/L — ABNORMAL HIGH (ref 0–37)
Albumin: 2.6 g/dL — ABNORMAL LOW (ref 3.5–5.2)
Alkaline Phosphatase: 157 U/L — ABNORMAL HIGH (ref 39–117)
BUN: 16 mg/dL (ref 6–23)
CO2: 25 mEq/L (ref 19–32)
Calcium: 8.9 mg/dL (ref 8.4–10.5)
Chloride: 102 mEq/L (ref 96–112)
Creatinine, Ser: 0.77 mg/dL (ref 0.40–1.50)
GFR: 98.82 mL/min (ref >60.00)
Glucose, Bld: 91 mg/dL (ref 70–99)
Potassium: 4.2 mEq/L (ref 3.5–5.1)
Sodium: 131 mEq/L — ABNORMAL LOW (ref 135–145)
Total Bilirubin: 2.2 mg/dL — ABNORMAL HIGH (ref 0.2–1.2)
Total Protein: 7.2 g/dL (ref 6.0–8.3)

## 2019-05-07 LAB — CBC WITH DIFFERENTIAL/PLATELET
Basophils Absolute: 0 10*3/uL (ref 0.0–0.1)
Basophils Relative: 1.2 % (ref 0.0–3.0)
Eosinophils Absolute: 0.1 10*3/uL (ref 0.0–0.7)
Eosinophils Relative: 2.1 % (ref 0.0–5.0)
HCT: 31 % — ABNORMAL LOW (ref 39.0–52.0)
Hemoglobin: 11 g/dL — ABNORMAL LOW (ref 13.0–17.0)
Lymphocytes Relative: 27.4 % (ref 12.0–46.0)
Lymphs Abs: 0.8 10*3/uL (ref 0.7–4.0)
MCHC: 35.7 g/dL (ref 30.0–36.0)
MCV: 108.6 fl — ABNORMAL HIGH (ref 78.0–100.0)
Monocytes Absolute: 0.4 10*3/uL (ref 0.1–1.0)
Monocytes Relative: 12.8 % — ABNORMAL HIGH (ref 3.0–12.0)
Neutro Abs: 1.6 10*3/uL (ref 1.4–7.7)
Neutrophils Relative %: 56.5 % (ref 43.0–77.0)
Platelets: 104 10*3/uL — ABNORMAL LOW (ref 150.0–400.0)
RBC: 2.85 Mil/uL — ABNORMAL LOW (ref 4.22–5.81)
RDW: 13.1 % (ref 11.5–15.5)
WBC: 2.8 10*3/uL — ABNORMAL LOW (ref 4.0–10.5)

## 2019-05-07 LAB — PROTIME-INR
INR: 1.4 ratio — ABNORMAL HIGH (ref 0.8–1.0)
Prothrombin Time: 16.1 s — ABNORMAL HIGH (ref 9.6–13.1)

## 2019-05-09 ENCOUNTER — Ambulatory Visit (INDEPENDENT_AMBULATORY_CARE_PROVIDER_SITE_OTHER): Payer: Medicare Other

## 2019-05-09 ENCOUNTER — Ambulatory Visit (INDEPENDENT_AMBULATORY_CARE_PROVIDER_SITE_OTHER): Payer: Medicare Other | Admitting: Family Medicine

## 2019-05-09 ENCOUNTER — Encounter: Payer: Self-pay | Admitting: Family Medicine

## 2019-05-09 ENCOUNTER — Other Ambulatory Visit: Payer: Self-pay

## 2019-05-09 VITALS — BP 115/68 | HR 67 | Temp 98.1°F | Ht 73.0 in | Wt 212.0 lb

## 2019-05-09 DIAGNOSIS — E222 Syndrome of inappropriate secretion of antidiuretic hormone: Secondary | ICD-10-CM | POA: Diagnosis not present

## 2019-05-09 DIAGNOSIS — R634 Abnormal weight loss: Secondary | ICD-10-CM | POA: Diagnosis not present

## 2019-05-09 DIAGNOSIS — D61818 Other pancytopenia: Secondary | ICD-10-CM | POA: Diagnosis not present

## 2019-05-09 DIAGNOSIS — E039 Hypothyroidism, unspecified: Secondary | ICD-10-CM

## 2019-05-09 DIAGNOSIS — I517 Cardiomegaly: Secondary | ICD-10-CM | POA: Diagnosis not present

## 2019-05-09 DIAGNOSIS — B159 Hepatitis A without hepatic coma: Secondary | ICD-10-CM | POA: Diagnosis not present

## 2019-05-09 DIAGNOSIS — Z0000 Encounter for general adult medical examination without abnormal findings: Secondary | ICD-10-CM

## 2019-05-09 DIAGNOSIS — E782 Mixed hyperlipidemia: Secondary | ICD-10-CM

## 2019-05-09 LAB — LIPID PANEL
Cholesterol: 172 mg/dL (ref 0–200)
HDL: 38.1 mg/dL — ABNORMAL LOW (ref >39.00)
LDL Cholesterol: 114 mg/dL — ABNORMAL HIGH (ref 0–99)
NonHDL: 133.79
Total CHOL/HDL Ratio: 5
Triglycerides: 101 mg/dL (ref 0.0–149.0)
VLDL: 20.2 mg/dL (ref 0.0–40.0)

## 2019-05-09 LAB — TSH: TSH: 0.54 u[IU]/mL (ref 0.35–4.50)

## 2019-05-09 NOTE — Patient Instructions (Signed)
Please return as scheduled for follow-up  If you have any questions or concerns, please don't hesitate to send me a message via MyChart or call the office at [phone]. Thank you for visiting with [place] today! It's our pleasure caring for you.

## 2019-05-09 NOTE — Progress Notes (Signed)
Subjective  Chief Complaint  Patient presents with  . Follow-up    Weight control     HPI: Timothy Grant is a 73 y.o. male who presents to [facility] [place] today for a Male Wellness Visit. He also has the concerns and/or needs as listed above in the chief complaint. These will be addressed in addition to the Health Maintenance Visit.   Wellness Visit: annual visit with health maintenance review and exam    73 year old male with hepatitis C here for annual physical.  I reviewed recent lab work.  Overall he is improving, however he continues to have concerns.  He has lost a significant amount of weight and is worried about this.  He does report that he only intermittently has a good appetite.  He denies nausea, abdominal pain.  His wife are concerned that something else wrong causing his weight loss.  He has hypothyroidism on medications.  He was last checked in March and was normal.  He denies palpitations, tremor, heat intolerance.  His diet is fairly healthy.  His wife thinks he gets 1500 to 2000 cal on good days, but he reports that 3 to 4 days a week he does not eat dinner.  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  This current illness is stressed to him greatly. Lifestyle: Body mass index is 27.97 kg/m. Wt Readings from Last 3 Encounters:  05/09/19 212 lb (96.2 kg)  04/13/19 229 lb 1.6 oz (103.9 kg)  03/27/19 228 lb 12.8 oz (103.8 kg)    Chronic disease management visit and/or acute problem visit:  Hepatitis A: Improving significantly.  Reassured.  Following with GI.  Decreased appetite and deconditioning due to chronic 74-month illness.  Recommend increasing caloric intake to 2500 cal/day.  Increasing protein in his diet. Recommend starting light strengthening exercises as well.   Hyponatremia due to SIADH: Stable salt tablets and fluid restriction.  We will continue to monitor monthly.  Hypoalbuminemia due to hepatitis A: Increase protein intake.  Continue  diuretics.  Much improved.  Anxiety: Admits to worsening stressors.  He is always concerned about his fluid intake, his weight, his appetite, his energy level etc.  He denies symptoms of depression.  His wife feels that he is very irritable and this is not typical for him.  He denies panic attacks.  Hypothyroidism and hyperlipidemia: Due for recheck.  Pancytopenia: Hematology evaluation recently reviewed.  Thinks this is most likely consistent with a reactive process from acute hepatitis A.  We will follow-up once liver enzymes returned to normal.  Patient Active Problem List   Diagnosis Date Noted  . Acquired hypothyroidism 02/05/2011    Priority: High  . Adenomatous colon polyp 02/05/2011    Priority: High  . Mixed hyperlipidemia 02/05/2011    Priority: High  . Osteoarthritis of finger of left hand 01/07/2017    Priority: Medium  . Leukopenia 08/09/2012    Priority: Medium  . Trigger finger, left ring finger 01/07/2017    Priority: Low  . Male erectile disorder 02/09/2012    Priority: Low  . Pancytopenia ([place]) 03/27/2019  . Non-traumatic rhabdomyolysis 02/21/2019   Health Maintenance  Topic Date Due  . COLONOSCOPY  12/07/2019 (Originally 02/23/2019)  . INFLUENZA VACCINE  07/07/2019  . TETANUS/TDAP  02/04/2021  . Hepatitis C Screening  Completed  . PNA vac Low Risk Adult  Completed   Immunization History  Administered Date(s) Administered  . Hepatitis B, ped/adol 08/06/2009  . Influenza Split 08/07/2011, 08/28/2013  .  Influenza, High Dose Seasonal PF 09/26/2017  . Influenza, Quadrivalent, Recombinant, Inj, Pf 09/06/2016  . Influenza, Seasonal, Injecte, Preservative Fre 09/04/2014, 09/09/2015  . Influenza,trivalent, recombinat, inj, PF 08/09/2012  . Influenza-Unspecified 09/22/2018  . Pneumococcal Conjugate-13 11/04/2014  . Pneumococcal Polysaccharide-23 02/05/2011  . Tdap 02/05/2011  . Zoster 02/05/2011   We updated and reviewed the patient's past history in detail  and it is documented below. Allergies: Patient is allergic to rosuvastatin. Past Medical History  has a past medical history of Adenomatous polyps, ED (erectile dysfunction), Hepatitis A, Hyperlipidemia, Hypothyroidism, and Varicocele (02/01/2011). Past Surgical History Patient  has a past surgical history that includes Tonsillectomy; Eye surgery (Right, 1956); Eye surgery (Left, 1963); and Liver biopsy. Social History Patient  reports that he has quit smoking. He has never used smokeless tobacco. He reports previous alcohol use. He reports that he does not use drugs. Family History family history includes Alzheimer's disease in his mother; Arthritis in his mother; Cancer in his brother and sister; Dementia in his mother; Heart disease in his father and paternal grandmother; Hyperlipidemia in his brother; Stroke in his brother and mother. Review of Systems: Constitutional: negative for fever or malaise Ophthalmic: negative for photophobia, double vision or loss of vision Cardiovascular: negative for chest pain, dyspnea on exertion, or new LE swelling Respiratory: negative for SOB or persistent cough Gastrointestinal: negative for abdominal pain, change in bowel habits or melena Genitourinary: negative for dysuria or gross hematuria Musculoskeletal: negative for new gait disturbance or muscular weakness Integumentary: negative for new or persistent rashes Neurological: negative for TIA or stroke symptoms Psychiatric: negative for SI or delusions Allergic/Immunologic: negative for hives  Patient Care Team    Relationship Specialty Notifications Start End  [name], MD PCP - General Family Medicine  11/07/17   Stephannie Li, [place]  Ophthalmology  03/22/18   Daryll Brod, MD Consulting Physician Orthopedic Surgery  03/22/18   [place]  Dentistry  03/22/18   Juanita Craver, MD Consulting Physician Gastroenterology  03/22/18   Thornton Park, MD Consulting Physician Gastroenterology   03/27/19    Objective  Vitals: BP 115/68 (BP Location: Left Arm, Patient Position: Sitting, Cuff Size: Normal)   Pulse 67   Temp 98.1 F (36.7 C)   Ht 6\' 1"  (1.854 m)   Wt 212 lb (96.2 kg)   SpO2 96%   BMI 27.97 kg/m  General:  Well developed, well nourished, no acute distress but appears worried Psych:  Alert and orientedx3,normal mood and affect HEENT:  Normocephalic, atraumatic, non-icteric sclera, PERRL, oropharynx is clear without mass or exudate, supple neck without adenopathy, mass or thyromegaly Cardiovascular:  Normal S1, S2, RRR without gallop, rub or murmur, nondisplaced PMI, +2 distal pulses in bilateral upper and lower extremities, trace edema bilaterally. Respiratory:  Good breath sounds bilaterally, CTAB with normal respiratory effort Gastrointestinal: normal bowel sounds, soft, non-tender, no noted masses. No HSM MSK: no deformities, contusions. Joints are without erythema or swelling. Spine and CVA region are nontender Skin:  Warm, no rashes or suspicious lesions noted Neurologic:    Mental status is normal. CN 2-11 are normal. Gross motor and sensory exams are normal. Stable gait. No tremor GU: No inguinal hernias or adenopathy are appreciated bilaterally   Assessment  1. Annual physical exam   2. Viral hepatitis A without hepatic coma   3. SIADH (syndrome of inappropriate ADH production) (HCC)   4. Pancytopenia ([place])   5. Acquired hypothyroidism   6. Mixed hyperlipidemia      Plan  Male Wellness Visit:  Age appropriate Health Maintenance and Prevention measures were discussed with patient. Included topics are cancer screening recommendations, ways to keep healthy (see AVS) including dietary and exercise recommendations, regular eye and dental care, use of seat belts, and avoidance of moderate alcohol use and tobacco use.   BMI: discussed patient's BMI and encouraged positive lifestyle modifications to help get to or maintain a target BMI.  HM needs and  immunizations were addressed and ordered. See below for orders. See HM and immunization section for updates.  Routine labs and screening tests ordered including cmp, cbc and lipids where appropriate.  Discussed recommendations regarding Vit D and calcium supplementation (see AVS)  Chronic disease f/u and/or acute problem visit: (deemed necessary to be done in addition to the wellness visit):  Hepatitis A: Improving.  Continue to follow along with GI.  Reassured.  Hyponatremia: Stable, continue salt tablets fluid restriction and monitoring hopefully will resolve once acute illness resolves.  Although this could be chronic.  Anxiety/stress reaction: Counseling done.  Strongly encourage patient to try not to constantly think about his medical problems.  Tends to try to put things together that are not exactly correct and then worries about him.  I reassured him that his labs look good.  Physically he looks well.  I offered antidepressants or anxiety medicine and he defers for now.  Will monitor.  Recheck 6 weeks.  Recommend working on taking good care of himself and doing things that he enjoys doing.  Weight loss: Decreased appetite due to hepatitis.  Recommend increasing caloric intake, discussed healthy high caloric foods.  Recheck thyroid  Recheck thyroid and lipid panels  Follow-up with hematology as recommended  Follow up: Recheck 6 weeks  Commons side effects, risks, benefits, and alternatives for medications and treatment plan prescribed today were discussed, and the patient expressed understanding of the given instructions. Patient is instructed to call or message via MyChart if he/she has any questions or concerns regarding our treatment plan. No barriers to understanding were identified. We discussed Red Flag symptoms and signs in detail. Patient expressed understanding regarding what to do in case of urgent or emergency type symptoms.   Medication list was reconciled, printed and  provided to the patient in AVS. Patient instructions and summary information was reviewed with the patient as documented in the AVS. This note was prepared with assistance of Dragon voice recognition software. Occasional wrong-word or sound-a-like substitutions may have occurred due to the inherent limitations of voice recognition software  No orders of the defined types were placed in this encounter.  No orders of the defined types were placed in this encounter.

## 2019-05-10 NOTE — Progress Notes (Signed)
Please call patient:chest xray looks good, there are no masses or worrisome lung findings. The heart is a little bit big so I would like him to have and ultrasound of the heart to ensure that some of his swelling isn't related to the heart. I've ordered the test for him. His labs look fine. Thyroid levels are OK, but I would not like to be any higher, so I will recheck in 6 weeks and cut back on medications at that time if they are changing. cholesterol is fine.

## 2019-05-10 NOTE — Addendum Note (Signed)
Addended by: Billey Chang on: 05/10/2019 03:25 PM   Modules accepted: Orders

## 2019-05-25 ENCOUNTER — Other Ambulatory Visit: Payer: Self-pay

## 2019-05-25 ENCOUNTER — Inpatient Hospital Stay: Payer: Medicare Other

## 2019-05-25 ENCOUNTER — Inpatient Hospital Stay: Payer: Medicare Other | Attending: Internal Medicine | Admitting: Internal Medicine

## 2019-05-25 VITALS — BP 107/71 | HR 71 | Temp 98.2°F | Resp 18 | Ht 73.0 in | Wt 212.9 lb

## 2019-05-25 DIAGNOSIS — R7989 Other specified abnormal findings of blood chemistry: Secondary | ICD-10-CM

## 2019-05-25 DIAGNOSIS — D61818 Other pancytopenia: Secondary | ICD-10-CM | POA: Diagnosis not present

## 2019-05-25 DIAGNOSIS — E039 Hypothyroidism, unspecified: Secondary | ICD-10-CM | POA: Diagnosis not present

## 2019-05-25 DIAGNOSIS — D72818 Other decreased white blood cell count: Secondary | ICD-10-CM

## 2019-05-25 DIAGNOSIS — B159 Hepatitis A without hepatic coma: Secondary | ICD-10-CM | POA: Insufficient documentation

## 2019-05-25 DIAGNOSIS — R945 Abnormal results of liver function studies: Secondary | ICD-10-CM

## 2019-05-25 DIAGNOSIS — D7589 Other specified diseases of blood and blood-forming organs: Secondary | ICD-10-CM

## 2019-05-25 LAB — CMP (CANCER CENTER ONLY)
ALT: 67 U/L — ABNORMAL HIGH (ref 0–44)
AST: 95 U/L — ABNORMAL HIGH (ref 15–41)
Albumin: 2.6 g/dL — ABNORMAL LOW (ref 3.5–5.0)
Alkaline Phosphatase: 168 U/L — ABNORMAL HIGH (ref 38–126)
Anion gap: 5 (ref 5–15)
BUN: 18 mg/dL (ref 8–23)
CO2: 22 mmol/L (ref 22–32)
Calcium: 8.8 mg/dL — ABNORMAL LOW (ref 8.9–10.3)
Chloride: 107 mmol/L (ref 98–111)
Creatinine: 0.79 mg/dL (ref 0.61–1.24)
GFR, Est AFR Am: >60 mL/min (ref >60)
GFR, Estimated: >60 mL/min (ref >60)
Glucose, Bld: 129 mg/dL — ABNORMAL HIGH (ref 70–99)
Potassium: 4.2 mmol/L (ref 3.5–5.1)
Sodium: 134 mmol/L — ABNORMAL LOW (ref 135–145)
Total Bilirubin: 1.4 mg/dL — ABNORMAL HIGH (ref 0.3–1.2)
Total Protein: 7.5 g/dL (ref 6.5–8.1)

## 2019-05-25 LAB — CBC WITH DIFFERENTIAL (CANCER CENTER ONLY)
Abs Immature Granulocytes: 0 10*3/uL (ref 0.00–0.07)
Basophils Absolute: 0 10*3/uL (ref 0.0–0.1)
Basophils Relative: 1 %
Eosinophils Absolute: 0.1 10*3/uL (ref 0.0–0.5)
Eosinophils Relative: 2 %
HCT: 30.1 % — ABNORMAL LOW (ref 39.0–52.0)
Hemoglobin: 10.5 g/dL — ABNORMAL LOW (ref 13.0–17.0)
Immature Granulocytes: 0 %
Lymphocytes Relative: 30 %
Lymphs Abs: 0.8 10*3/uL (ref 0.7–4.0)
MCH: 37.4 pg — ABNORMAL HIGH (ref 26.0–34.0)
MCHC: 34.9 g/dL (ref 30.0–36.0)
MCV: 107.1 fL — ABNORMAL HIGH (ref 80.0–100.0)
Monocytes Absolute: 0.3 10*3/uL (ref 0.1–1.0)
Monocytes Relative: 13 %
Neutro Abs: 1.4 10*3/uL — ABNORMAL LOW (ref 1.7–7.7)
Neutrophils Relative %: 54 %
Platelet Count: 92 10*3/uL — ABNORMAL LOW (ref 150–400)
RBC: 2.81 MIL/uL — ABNORMAL LOW (ref 4.22–5.81)
RDW: 12.8 % (ref 11.5–15.5)
WBC Count: 2.6 10*3/uL — ABNORMAL LOW (ref 4.0–10.5)
nRBC: 0 % (ref 0.0–0.2)

## 2019-05-25 LAB — IRON AND TIBC
Iron: 135 ug/dL (ref 42–163)
Saturation Ratios: 47 % (ref 20–55)
TIBC: 284 ug/dL (ref 202–409)
UIBC: 149 ug/dL (ref 117–376)

## 2019-05-25 LAB — FERRITIN: Ferritin: 255 ng/mL (ref 24–336)

## 2019-05-25 LAB — LACTATE DEHYDROGENASE: LDH: 198 U/L — ABNORMAL HIGH (ref 98–192)

## 2019-05-25 NOTE — Progress Notes (Signed)
Diagnosis Macrocytosis - Plan: Methylmalonic acid, serum  Other decreased white blood cell (WBC) count - Plan: CBC with Differential ([place] Only), CMP ([place] only), Lactate dehydrogenase (LDH), Ambulatory referral to Gastroenterology  Staging Cancer Staging No matching staging information was found for the patient.  Assessment and Plan:    1.  Leukopenia and thrombocytopenia.  73 year old male who is referred for evaluation due to leukopenia and thrombocytopenia.  Pt is followed by Dr. Tarri Glenn of GI.  He reports exposure to Hepatitis A through an acquaintance.  He was also on Crestor.  He was seen by Dr. Jonni Sanger and also diagnosed with hepatitis A.  He was recommended to discontinue Crestor.    Labs done 01/03/2019 showed a white count of 2.9 hemoglobin 12.1 and platelets 135,000.  Labs done January 10, 2019 showed an AST greater than 1000 and ALT of 820 bilirubin 17.  Liver function tests remained elevated.  Most recent labs done on 04/09/2019 showed a white count 2.5 hemoglobin 11.1 platelets 102,000.  He had a normal differential.  Chemistries showed a creatinine of 0.71 potassium 4 AST was 324 ALT was 147 bilirubin 4.2.  PT was 16.    Patient had MRI and MRCP done on January 11, 2019 that showed IMPRESSION: 1. Periportal edema with some subtle delayed periportal enhancement. Although not entirely specific, this pattern is often encountered in the setting of acute hepatitis. 2. Mild-to-moderate gallbladder wall thickening, which may be secondary to the patient's hypoalbuminemia although inflammation is not readily excluded. 3. Trace perihepatic ascites and perisplenic ascites. Small amount of fluid around the descending duodenum is probably incidental and less likely from duodenitis. 4. The biliary tree appears otherwise unremarkable.  He continues to be followed by GI.  Patient is followed by Dr. Manuella Ghazi at [facility] liver transplant clinic.  Pt had liver biopsy done on 01/24/2019 at  [facility] that showed  A: Liver, core needle biopsy - Severe acute hepatitis with focal areas of confluent necrosis (see microscopic description and comment) - Minimal to mild early portal/periportal fibrosis - In situ hybridization for EBV virus encoded RNA is negative     Labs done today 05/25/2019  reviewed and showed WBC 2.6 HB 10.5 plts 92,000.  MCV 107.  Chemistries show K+ 4.2 Cr 0.79 AST 95 ALT 67. Bilirubin 1.4.   Ferritin 255 and TS 47.  Awaiting results of hemochromatosis labs and pt will be notified of results.    I have again discussed with pt and wife CBC findings related to LFT findings.  Will discuss with transplant team/GI  if they desire further work-up for confirmation such as bone marrow biopsy.  Pt will be set up for repeat labs in 09/2019.    2  Macrocytosis.  MCV 107.  Pt previously had B12 levels checked at [facility] and were WNL.  Awaiting results of MMA.  Suspect elevated MCV due to liver findings.    3.  Abnormal liver function tests. Labs done  04/09/2019 showed an AST of 324 ALT of 147.  Bilirubin was 4.  Labs done 05/25/2019 show LFTs elevated but improved.  AST 95 ALT 67.  Bilirubin 1.4.    Patient is followed by Dr. Tarri Glenn of GI as well as [facility] with Dr. Manuella Ghazi.  He should continue to follow-up with GI as recommended.  Patient should avoid Tylenol and cholesterol medications until LFTs improved.  Iron studies shows Ferritin of 255.  TS 47.  Awaiting results of hemochromatosis evaluation and pt will be  notified of results.   4.  Hepatitis A.  Patient should follow-up with GI and PCP as recommended.  5.  Hypothyroidism.  Patient is on Synthroid. Follow-up with PCP for monitoring.    25  minutes spent with more than 50% spent in review of records, counseling and coordination of care.    Interval History:  Historical data obtained from note dated 04/13/2019.  73 year old male who is referred for evaluation due to leukopenia and thrombocytopenia.  Pt is followed by Dr. Tarri Glenn of GI.   He reports exposure to Hepatitis A through an acquaintance.  He was also on Crestor.  He was seen by Dr. Jonni Sanger and also diagnosed with hepatitis A.  He was recommended to discontinue Crestor.    Labs done 01/03/2019 showed a white count of 2.9 hemoglobin 12.1 and platelets 135,000.  Labs done January 10, 2019 showed an AST greater than 1000 and ALT of 820 bilirubin 17.  Liver function tests remained elevated.  Most recent labs done on 04/09/2019 showed a white count 2.5 hemoglobin 11.1 platelets 102,000.  He had a normal differential.  Chemistries showed a creatinine of 0.71 potassium 4 AST was 324 ALT was 147 bilirubin 4.2.  PT was 16.    Patient had MRI and MRCP done on January 11, 2019 that showed IMPRESSION: 1. Periportal edema with some subtle delayed periportal enhancement. Although not entirely specific, this pattern is often encountered in the setting of acute hepatitis. 2. Mild-to-moderate gallbladder wall thickening, which may be secondary to the patient's hypoalbuminemia although inflammation is not readily excluded. 3. Trace perihepatic ascites and perisplenic ascites. Small amount of fluid around the descending duodenum is probably incidental and less likely from duodenitis. 4. The biliary tree appears otherwise unremarkable.   He continues to be followed by GI.  Patient is followed by Dr. Manuella Ghazi at [facility] liver transplant clinic.  Pt had liver biopsy done on 01/24/2019 at [facility] that showed  A: Liver, core needle biopsy - Severe acute hepatitis with focal areas of confluent necrosis (see microscopic description and comment) - Minimal to mild early portal/periportal fibrosis - In situ hybridization for EBV virus encoded RNA is negative     Current Status:  Pt is seen today for follow-up.  He is here to go over labs and has wife on phone.     Problem List Patient Active Problem List   Diagnosis Date Noted  . Pancytopenia ([place]) [F09.323] 03/27/2019  . Non-traumatic rhabdomyolysis  [M62.82] 02/21/2019  . Osteoarthritis of finger of left hand [M19.042] 01/07/2017  . Trigger finger, left ring finger [M65.342] 01/07/2017  . Leukopenia [D72.819] 08/09/2012  . Male erectile disorder [N52.9] 02/09/2012  . Acquired hypothyroidism [E03.9] 02/05/2011  . Adenomatous colon polyp [D12.6] 02/05/2011  . Mixed hyperlipidemia [E78.2] 02/05/2011    Past Medical History Past Medical History:  Diagnosis Date  . Adenomatous polyps   . ED (erectile dysfunction)   . Hepatitis A   . Hyperlipidemia   . Hypothyroidism   . Varicocele 02/01/2011   Left testicle    Past Surgical History Past Surgical History:  Procedure Laterality Date  . EYE SURGERY Right 1956  . EYE SURGERY Left 1963  . LIVER BIOPSY    . TONSILLECTOMY      Family History Family History  Problem Relation Age of Onset  . Alzheimer's disease Mother   . Arthritis Mother   . Dementia Mother   . Stroke Mother   . Heart disease Father   . Cancer  Sister        uterine  . Hyperlipidemia Brother   . Stroke Brother   . Cancer Brother   . Heart disease Paternal Grandmother   . Colon cancer Neg Hx   . Esophageal cancer Neg Hx      Social History  reports that he has quit smoking. He has never used smokeless tobacco. He reports previous alcohol use. He reports that he does not use drugs.  Medications  Current Outpatient Medications:  .  acetaminophen (TYLENOL) 325 MG tablet, Take 650 mg by mouth as needed., Disp: , Rfl:  .  Cholecalciferol (VITAMIN D3) 5000 units TABS, Take 1 tablet by mouth daily. , Disp: , Rfl:  .  cyclobenzaprine (FLEXERIL) 10 MG tablet, Take 1 tablet (10 mg total) by mouth as needed., Disp: 30 tablet, Rfl: 2 .  folic acid (FOLVITE) 1 MG tablet, Take 1 tablet (1 mg total) by mouth daily., Disp: 90 tablet, Rfl: 3 .  Glucosamine HCl 1000 MG TABS, Take 2 tablets by mouth daily., Disp: , Rfl:  .  levothyroxine (SYNTHROID, LEVOTHROID) 150 MCG tablet, Take 1 tablet (150 mcg total) by mouth  daily before breakfast., Disp: 90 tablet, Rfl: 3 .  Multiple Vitamin (MULTIVITAMIN) capsule, Take 1 capsule by mouth daily. , Disp: , Rfl:  .  Omega-3 1000 MG CAPS, Take 1 tablet by mouth daily., Disp: , Rfl:  .  sildenafil (REVATIO) 20 MG tablet, Use 5 tablets PO as needed, Disp: 100 tablet, Rfl: 3  Allergies Rosuvastatin  Review of Systems Review of Systems - Oncology ROS negative   Physical Exam  Vitals Wt Readings from Last 3 Encounters:  05/25/19 212 lb 14.4 oz (96.6 kg)  05/09/19 212 lb (96.2 kg)  04/13/19 229 lb 1.6 oz (103.9 kg)   Temp Readings from Last 3 Encounters:  05/25/19 98.2 F (36.8 C) (Oral)  05/09/19 98.1 F (36.7 C)  04/13/19 97.8 F (36.6 C) (Oral)   BP Readings from Last 3 Encounters:  05/25/19 107/71  05/09/19 115/68  04/13/19 107/71   Pulse Readings from Last 3 Encounters:  05/25/19 71  05/09/19 67  04/13/19 87   Constitutional: Well-developed, well-nourished, and in no distress.   HENT: Head: Normocephalic and atraumatic.  Mouth/Throat: No oropharyngeal exudate. Mucosa moist. Eyes: Pupils are equal, round, and reactive to light. Conjunctivae are normal. No scleral icterus.  Neck: Normal range of motion. Neck supple. No JVD present.  Cardiovascular: Normal rate, regular rhythm and normal heart sounds.  Exam reveals no gallop and no friction rub.   No murmur heard. Pulmonary/Chest: Effort normal and breath sounds normal. No respiratory distress. No wheezes.No rales.  Abdominal: Soft. Bowel sounds are normal. No distension. There is no tenderness. There is no guarding.  Musculoskeletal: No edema or tenderness.  Lymphadenopathy: No cervical, axillary or supraclavicular adenopathy.  Neurological: Alert and oriented to person, place, and time. No cranial nerve deficit.  Skin: Skin is warm and dry. No rash noted. No erythema. No pallor.  Psychiatric: Affect and judgment normal.   Labs Appointment on 05/25/2019  Component Date Value Ref Range  Status  . Iron 05/25/2019 135  42 - 163 ug/dL Final  . TIBC 05/25/2019 284  202 - 409 ug/dL Final  . Saturation Ratios 05/25/2019 47  20 - 55 % Final  . UIBC 05/25/2019 149  117 - 376 ug/dL Final   Performed at [facility], [place] [address], [place], [place] [address]  . Ferritin 05/25/2019 255  24 - 336 ng/mL Final   Performed at [facility], [place] [address], [place], [place] [address]  . LDH 05/25/2019 198* 98 - 192 U/L Final   Performed at [facility], [place] [address] [place], [place] [address]  . Sodium 05/25/2019 134* 135 - 145 mmol/L Final  . Potassium 05/25/2019 4.2  3.5 - 5.1 mmol/L Final  . Chloride 05/25/2019 107  98 - 111 mmol/L Final  . CO2 05/25/2019 22  22 - 32 mmol/L Final  . Glucose, Bld 05/25/2019 129* 70 - 99 mg/dL Final  . BUN 05/25/2019 18  8 - 23 mg/dL Final  . Creatinine 05/25/2019 0.79  0.61 - 1.24 mg/dL Final  . Calcium 05/25/2019 8.8* 8.9 - 10.3 mg/dL Final  . Total Protein 05/25/2019 7.5  6.5 - 8.1 g/dL Final  . Albumin 05/25/2019 2.6* 3.5 - 5.0 g/dL Final  . AST 05/25/2019 95* 15 - 41 U/L Final  . ALT 05/25/2019 67* 0 - 44 U/L Final  . Alkaline Phosphatase 05/25/2019 168* 38 - 126 U/L Final  . Total Bilirubin 05/25/2019 1.4* 0.3 - 1.2 mg/dL Final  . GFR, Est Non Af Am 05/25/2019 >60  >60 mL/min Final  . GFR, Est AFR Am 05/25/2019 >60  >60 mL/min Final  . Anion gap 05/25/2019 5  5 - 15 Final   Performed at [facility], [place] [address] [place], [place] [address]  . WBC Count 05/25/2019 2.6* 4.0 - 10.5 K/uL Final  . RBC 05/25/2019 2.81* 4.22 - 5.81 MIL/uL Final  . Hemoglobin 05/25/2019 10.5* 13.0 - 17.0 g/dL Final  . HCT 05/25/2019 30.1* 39.0 - 52.0 % Final  . MCV 05/25/2019 107.1* 80.0 - 100.0 fL Final  . MCH 05/25/2019 37.4* 26.0 - 34.0 pg Final  . MCHC 05/25/2019 34.9  30.0 - 36.0 g/dL Final  . RDW 05/25/2019 12.8  11.5 - 15.5 % Final  . Platelet Count  05/25/2019 92* 150 - 400 K/uL Final  . nRBC 05/25/2019 0.0  0.0 - 0.2 % Final  . Neutrophils Relative % 05/25/2019 54  % Final  . Neutro Abs 05/25/2019 1.4* 1.7 - 7.7 K/uL Final  . Lymphocytes Relative 05/25/2019 30  % Final  . Lymphs Abs 05/25/2019 0.8  0.7 - 4.0 K/uL Final  . Monocytes Relative 05/25/2019 13  % Final  . Monocytes Absolute 05/25/2019 0.3  0.1 - 1.0 K/uL Final  . Eosinophils Relative 05/25/2019 2  % Final  . Eosinophils Absolute 05/25/2019 0.1  0.0 - 0.5 K/uL Final  . Basophils Relative 05/25/2019 1  % Final  . Basophils Absolute 05/25/2019 0.0  0.0 - 0.1 K/uL Final  . Immature Granulocytes 05/25/2019 0  % Final  . Abs Immature Granulocytes 05/25/2019 0.00  0.00 - 0.07 K/uL Final   Performed at [facility], [place] [address], [place], [place] [address]     Pathology Orders Placed This Encounter  Procedures  . Methylmalonic acid, serum    Add to blood in lab.  Spoke with Anderson Malta in lab.      Standing Status:   Future    Number of Occurrences:   1    Standing Expiration Date:   05/24/2020  . CBC with Differential ([facility] Only)    Standing Status:   Future    Standing Expiration Date:   05/24/2020  . CMP ([place] only)    Standing Status:   Future    Standing Expiration  Date:   05/24/2020  . Lactate dehydrogenase (LDH)    Standing Status:   Future    Standing Expiration Date:   05/24/2020  . Ambulatory referral to Gastroenterology    Referral Priority:   Routine    Referral Type:   Consultation    Referral Reason:   Specialty Services Required    Number of Visits Requested:   1       Zoila Shutter MD

## 2019-05-28 ENCOUNTER — Telehealth: Payer: Self-pay | Admitting: Internal Medicine

## 2019-05-28 NOTE — Telephone Encounter (Signed)
Scheduled lab per los. No f/u scheduled due to MD not having completed template/schedule

## 2019-05-29 LAB — METHYLMALONIC ACID, SERUM: Methylmalonic Acid, Quantitative: 216 nmol/L (ref 0–378)

## 2019-05-31 ENCOUNTER — Ambulatory Visit: Payer: Medicare Other | Admitting: Gastroenterology

## 2019-06-01 LAB — HEMOCHROMATOSIS DNA-PCR(C282Y,H63D)

## 2019-06-04 ENCOUNTER — Other Ambulatory Visit (INDEPENDENT_AMBULATORY_CARE_PROVIDER_SITE_OTHER): Payer: Medicare Other

## 2019-06-04 DIAGNOSIS — B159 Hepatitis A without hepatic coma: Secondary | ICD-10-CM | POA: Diagnosis not present

## 2019-06-04 LAB — CBC WITH DIFFERENTIAL/PLATELET
Basophils Absolute: 0 10*3/uL (ref 0.0–0.1)
Basophils Relative: 0.7 % (ref 0.0–3.0)
Eosinophils Absolute: 0.1 10*3/uL (ref 0.0–0.7)
Eosinophils Relative: 2.1 % (ref 0.0–5.0)
HCT: 32.6 % — ABNORMAL LOW (ref 39.0–52.0)
Hemoglobin: 11.5 g/dL — ABNORMAL LOW (ref 13.0–17.0)
Lymphocytes Relative: 29 % (ref 12.0–46.0)
Lymphs Abs: 0.9 10*3/uL (ref 0.7–4.0)
MCHC: 35.2 g/dL (ref 30.0–36.0)
MCV: 107.2 fl — ABNORMAL HIGH (ref 78.0–100.0)
Monocytes Absolute: 0.4 10*3/uL (ref 0.1–1.0)
Monocytes Relative: 13.9 % — ABNORMAL HIGH (ref 3.0–12.0)
Neutro Abs: 1.6 10*3/uL (ref 1.4–7.7)
Neutrophils Relative %: 54.3 % (ref 43.0–77.0)
Platelets: 98 10*3/uL — ABNORMAL LOW (ref 150.0–400.0)
RBC: 3.04 Mil/uL — ABNORMAL LOW (ref 4.22–5.81)
RDW: 12.8 % (ref 11.5–15.5)
WBC: 3 10*3/uL — ABNORMAL LOW (ref 4.0–10.5)

## 2019-06-04 LAB — COMPREHENSIVE METABOLIC PANEL
ALT: 43 U/L (ref 0–53)
AST: 62 U/L — ABNORMAL HIGH (ref 0–37)
Albumin: 3 g/dL — ABNORMAL LOW (ref 3.5–5.2)
Alkaline Phosphatase: 134 U/L — ABNORMAL HIGH (ref 39–117)
BUN: 17 mg/dL (ref 6–23)
CO2: 22 mEq/L (ref 19–32)
Calcium: 9.3 mg/dL (ref 8.4–10.5)
Chloride: 106 mEq/L (ref 96–112)
Creatinine, Ser: 0.74 mg/dL (ref 0.40–1.50)
GFR: 103.44 mL/min (ref >60.00)
Glucose, Bld: 95 mg/dL (ref 70–99)
Potassium: 4.1 mEq/L (ref 3.5–5.1)
Sodium: 135 mEq/L (ref 135–145)
Total Bilirubin: 1.3 mg/dL — ABNORMAL HIGH (ref 0.2–1.2)
Total Protein: 7.5 g/dL (ref 6.0–8.3)

## 2019-06-04 LAB — PROTIME-INR
INR: 1.3 ratio — ABNORMAL HIGH (ref 0.8–1.0)
Prothrombin Time: 15.4 s — ABNORMAL HIGH (ref 9.6–13.1)

## 2019-06-05 ENCOUNTER — Encounter: Payer: Self-pay | Admitting: *Deleted

## 2019-06-05 ENCOUNTER — Telehealth: Payer: Self-pay | Admitting: *Deleted

## 2019-06-05 NOTE — Progress Notes (Signed)
Please call patient: I have reviewed his/her lab results. So good to see that everything continues to improve! Liver is almost normal. Protein in blood is improving. Sodium is now in the normal range. Keep everything as is and we will recheck next month. May adjust back on salt tablets/water restrictions at that time. Great news!

## 2019-06-05 NOTE — Telephone Encounter (Signed)
Patient is returning your call.  

## 2019-06-05 NOTE — Telephone Encounter (Signed)
Attempted to return the patient's phone call again, the patient did not answer, this RN left a message telling the patient to check his MyChart because the results have been sent.

## 2019-06-05 NOTE — Telephone Encounter (Signed)
Spoke to the patient, the patient wanted Dr. Trena Platt number that he misplaced. Number provided from google, nothing further at the time of the call.

## 2019-06-07 ENCOUNTER — Other Ambulatory Visit: Payer: Self-pay | Admitting: *Deleted

## 2019-06-07 MED ORDER — LEVOTHYROXINE SODIUM 150 MCG PO TABS: 150.0000 ug | ORAL_TABLET | Freq: Every day | ORAL | 3 refills | Status: DC

## 2019-06-15 ENCOUNTER — Telehealth: Payer: Self-pay | Admitting: Emergency Medicine

## 2019-06-15 ENCOUNTER — Ambulatory Visit (INDEPENDENT_AMBULATORY_CARE_PROVIDER_SITE_OTHER): Payer: Medicare Other | Admitting: Gastroenterology

## 2019-06-15 ENCOUNTER — Other Ambulatory Visit: Payer: Self-pay

## 2019-06-15 ENCOUNTER — Ambulatory Visit: Payer: Medicare Other | Admitting: Gastroenterology

## 2019-06-15 ENCOUNTER — Encounter: Payer: Self-pay | Admitting: Gastroenterology

## 2019-06-15 VITALS — BP 130/70 | HR 72 | Ht 73.0 in | Wt 213.0 lb

## 2019-06-15 DIAGNOSIS — D689 Coagulation defect, unspecified: Secondary | ICD-10-CM

## 2019-06-15 DIAGNOSIS — B159 Hepatitis A without hepatic coma: Secondary | ICD-10-CM | POA: Diagnosis not present

## 2019-06-15 DIAGNOSIS — E871 Hypo-osmolality and hyponatremia: Secondary | ICD-10-CM

## 2019-06-15 NOTE — Telephone Encounter (Signed)
Covid-19 screening questions   Do you now or have you had a fever in the last 14 days? No  Do you have any respiratory symptoms of shortness of breath or cough now or in the last 14 days? no  Do you have any family members or close contacts with diagnosed or suspected Covid-19 in the past 14 days? no  Have you been tested for Covid-19 and found to be positive? no      

## 2019-06-15 NOTE — Progress Notes (Addendum)
 Referring Provider: Andy, Camille L, MD Primary Care Physician:  Andy, Camille L, MD Hepatologist: Shah, Neil at [facility]  Reason for Consultation: hepatitis A   IMPRESSION:  Cholestatic, relapsing hepatitis A presenting with jaundice and fatigue, resolving    - presented 12/2018 after exposure in 10/2018    - liver biopsy at [facility] 01/24/19: severe acute hepatitis with focal areas of confluent necrosis    - Minimal to mild early portal/periportal fibrosis    - In situ hybridization for EBV virus encoded RNA was negative Hyponatremia, likely SIADH, resolving    - no evidence for cirrhosis or portal hypertension to suggest Type II HRS pathophysiology Coagulopathy, improving    - received outpatient vitamin K replacement, inpatient vitamin K and NAC Abnormal gallbladder ultrasound 12/29/2018    - echogenic liver    - diffusely thickened and edematous gallbladder, no pericholecystic fluid    - negative sonographic Murphy sign, no biliary dilatation Hypothyroidism History of colon polyps    - colonoscopy with Dr. Mann  Liver enzymes continue to improve, including his bilirubin. INR is also normalizing. Surprisingly, his platelets remain low. I personally reviewed the imaging from his MRI in February and the pathology report from his liver biopsy at [facility] that show no underlying advanced fibrosis and/or cirrhosis.  Unclear when his pancytyopenia from acute viral infection will resolve. He would like a second opinion from heme/onc as he and his wife were concerned when they heard the possibility of leukemia.   Hyponatremia has improved. I appreciate Dr. Andy's involvement in management of his hyponatremia.   PLAN: Continue CMP, direct bilirubin, PT/INR every two weeks Follow-up in this clinic as needed as long as his liver enzymes continue to improve Will obtain prior colonoscopy and path report from Dr. Mann  Surveillance colonoscopy when his energy has improved Referral to heme/onc  for a second opinion.  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 The interval history is obtained through the patient and review of his electronic health record. His wife accompanies him to this appointment.    Reduced muscle mass.  Energy is good. Appetite is good. He intentionally eats because he knows that he needs to.   He was recently seen in consultation by Dr. Hicks for leukopenia and thrombocytopenia.  He has macrocytosis that is been attributed to his acute viral infection.  Recent labs: Labs 04/23/19: Na 131, TB 2.8, AST 180, ALT 100, alk phos 204, alb 2.5, WBC 2.7, hgb 11.3, platelets 108, INR 1.4 05/07/19: Na 131, TB 2.2, AST 112, ALT 67, alk phos 157, alb 2.6, WBC 2.8, hgb 11.0, platelets 104, INR 1.4 05/25/19: Na 134, TB 1.4, AST 95, ALT 67, alk phos 168, alb 2.6, WBC 2.6, hgb 10.5, platlets 92 Labs 05/25/19: iron 135, ferritin 255 06/04/19: Na 135, TB 1.3, AST 62, ALT 43, alk phos 134, alb 3.0, WBC 3.0, hgb 11.5, platelets 98, INR 1.3   Last colonoscopy over 5 years ago with Dr. Mann. He has a history of adenomas.   No family history of colon cancer or polyps.   His wife is concerned that he will have smoldering leukemia as Dr. Higgs as deferred a   Past Medical History:  Diagnosis Date  . Adenomatous polyps   . ED (erectile dysfunction)   . Hepatitis A   . Hyperlipidemia   . Hypothyroidism   . Varicocele 02/01/2011   Left testicle    Past Surgical History:  Procedure Laterality   Date  . EYE SURGERY Right 1956  . EYE SURGERY Left 1963  . LIVER BIOPSY    . TONSILLECTOMY      Current Outpatient Medications  Medication Sig Dispense Refill  . acetaminophen (TYLENOL) 325 MG tablet Take 650 mg by mouth as needed.    . Cholecalciferol (VITAMIN D3) 5000 units TABS Take 1 tablet by mouth daily.     . cyclobenzaprine (FLEXERIL) 10 MG tablet Take 1 tablet (10 mg total) by mouth as needed. 30 tablet 2  . folic acid (FOLVITE) 1 MG  tablet Take 1 tablet (1 mg total) by mouth daily. 90 tablet 3  . Glucosamine HCl 1000 MG TABS Take 2 tablets by mouth daily.    . levothyroxine (SYNTHROID) 150 MCG tablet Take 1 tablet (150 mcg total) by mouth daily before breakfast. 90 tablet 3  . Multiple Vitamin (MULTIVITAMIN) capsule Take 1 capsule by mouth daily.     . Omega-3 1000 MG CAPS Take 1 tablet by mouth daily.    . sildenafil (REVATIO) 20 MG tablet Use 5 tablets PO as needed 100 tablet 3   No current facility-administered medications for this visit.     Allergies as of 06/15/2019 - Review Complete 05/25/2019  Allergen Reaction Noted  . Rosuvastatin Other (See Comments) 02/23/2019    Family History  Problem Relation Age of Onset  . Alzheimer's disease Mother   . Arthritis Mother   . Dementia Mother   . Stroke Mother   . Heart disease Father   . Cancer Sister        uterine  . Hyperlipidemia Brother   . Stroke Brother   . Cancer Brother   . Heart disease Paternal Grandmother   . Colon cancer Neg Hx   . Esophageal cancer Neg Hx     Social History   Socioeconomic History  . Marital status: Married    Spouse name: Not on file  . Number of children: Not on file  . Years of education: Not on file  . Highest education level: Not on file  Occupational History  . Not on file  Social Needs  . Financial resource strain: Not on file  . Food insecurity    Worry: Not on file    Inability: Not on file  . Transportation needs    Medical: Not on file    Non-medical: Not on file  Tobacco Use  . Smoking status: Former Smoker  . Smokeless tobacco: Never Used  . Tobacco comment: quit 1975  Substance and Sexual Activity  . Alcohol use: Not Currently    Comment: stopped January 2020  . Drug use: No  . Sexual activity: Yes  Lifestyle  . Physical activity    Days per week: Not on file    Minutes per session: Not on file  . Stress: Not on file  Relationships  . Social connections    Talks on phone: Not on file     Gets together: Not on file    Attends religious service: Not on file    Active member of club or organization: Not on file    Attends meetings of clubs or organizations: Not on file    Relationship status: Not on file  . Intimate partner violence    Fear of current or ex partner: Not on file    Emotionally abused: Not on file    Physically abused: Not on file    Forced sexual activity: Not on file  Other Topics Concern  .   Not on file  Social History Narrative  . Not on file   Filed Weights   06/15/19 1438  Weight: 213 lb (96.6 kg)    Physical Exam: Vital signs were reviewed. General:   Alert, well-nourished, pleasant and cooperative in NAD.  No longer jaundice.  Bilateral  Temporal wasting noted.  Head:  Normocephalic and atraumatic. Eyes:  Scleral icterus present.   Conjunctiva pink. Mouth:  No deformity or lesions.   Neck:  Supple; no thyromegaly. Lungs:  Clear throughout to auscultation.   No wheezes. Heart:  Regular rate and rhythm; no murmurs Abdomen:  Soft, nontender, normal bowel sounds. No rebound or guarding. Negative Murphy's sign. Liver edge remains nontender and palpable 3 fingerbreaths below the costal margin in the midclavicular line. Spleen tip not palpable.  LAD: No inguinal or umbilical LAD Rectal:  Deferred  Msk:  Symmetrical without gross deformities. Extremities:  No edema.  Neurologic:  Alert and  oriented x4;  Grossly nonfocal.  Skin:  No rash or bruise. No spider angiomas or palmar erythema. Psych:  Alert and cooperative, frustrated. Normal mood and affect.   Kimberly L. Beavers, MD, MPH [facility] 06/17/2019, 7:30 PM     

## 2019-06-17 ENCOUNTER — Encounter: Payer: Self-pay | Admitting: Gastroenterology

## 2019-06-19 ENCOUNTER — Telehealth: Payer: Self-pay | Admitting: Hematology

## 2019-06-19 NOTE — Telephone Encounter (Signed)
Spoke with patient to confirm new patient appt 07/10/2019 at 12 pm. Appointment letter mailed 7/14

## 2019-06-21 ENCOUNTER — Other Ambulatory Visit: Payer: Self-pay

## 2019-06-21 ENCOUNTER — Ambulatory Visit (INDEPENDENT_AMBULATORY_CARE_PROVIDER_SITE_OTHER): Payer: Medicare Other | Admitting: Family Medicine

## 2019-06-21 ENCOUNTER — Encounter: Payer: Self-pay | Admitting: Family Medicine

## 2019-06-21 ENCOUNTER — Telehealth: Payer: Self-pay | Admitting: Family Medicine

## 2019-06-21 VITALS — BP 124/7 | HR 80 | Temp 98.1°F | Resp 16 | Ht 73.0 in | Wt 208.8 lb

## 2019-06-21 DIAGNOSIS — B159 Hepatitis A without hepatic coma: Secondary | ICD-10-CM | POA: Diagnosis not present

## 2019-06-21 DIAGNOSIS — E039 Hypothyroidism, unspecified: Secondary | ICD-10-CM

## 2019-06-21 DIAGNOSIS — D72819 Decreased white blood cell count, unspecified: Secondary | ICD-10-CM | POA: Diagnosis not present

## 2019-06-21 DIAGNOSIS — E222 Syndrome of inappropriate secretion of antidiuretic hormone: Secondary | ICD-10-CM | POA: Diagnosis not present

## 2019-06-21 LAB — TSH: TSH: 0.06 u[IU]/mL — ABNORMAL LOW (ref 0.35–4.50)

## 2019-06-21 NOTE — Telephone Encounter (Signed)
Called for PA for Echo  As part of our Prior Authorization Reduction program, [employer] no longer requires prior authorization for CT, MRI/MRA and transthoracic echocardiography procedures for Medicare members effective 12/06/2016.

## 2019-06-21 NOTE — Progress Notes (Signed)
Subjective  CC:  Chief Complaint  Patient presents with  . Hypothyroidism  . Leg Swelling    Improving    HPI: Timothy Grant is a 73 y.o. male who presents to the office today to address the problems listed above in the chief complaint.  Timothy Grant returns for follow-up of his TSH.  6 weeks ago his TSH was borderline low.  Due to his weight loss, his medications may need to be adjusted so we are rechecking today.  He feels well.  Energy is improving steadily.  He golfed yesterday in the 90 degree weather and did very well.  Strength is improving as well.  I reviewed recent liver test done by GI and all continue to improve.  Albumin remains mildly low at 3.0.  Sodium has normalized to 135.  Emotionally he is improved.  He denies abdominal pain, jaundice, palpitations.  He reports minimal lower extremity swelling.  Mild cardiomegaly on chest x-ray in setting of lower extremity edema: Has not been set up for echocardiogram in spite of referral.  We will get this set up he denies shortness of breath  Reviewed hematology notes: Apparently he was referred back to Dr. Brigitte Pulse regarding possible bone marrow biopsy.  This is for unclear reasons.  He has many questions.  From what I can tell, hematology thinks that his worsening pancytopenia was due to reaction from his chronic hepatitis a Assessment  1. Acquired hypothyroidism   2. Leukopenia, unspecified type   3. Viral hepatitis A without hepatic coma   4. SIADH (syndrome of inappropriate ADH production) ([place])      Plan   Hypothyroidism: Recheck today to ensure changing dosing is not needed.  Education given  Leukopenia chronic with new pancytopenia that is improving: Discussed with patient.  Recommend calling hematology office for clarification.  I do believe that in time his levels will continue to improve  Hyponatremia and lower extremity edema in setting of hypoalbuminemia: Continue to allow liver to improve.  Continue salt tablet  daily for now.  Sodium is improved.  Liberalize fluid restriction to 2 L daily.  Recheck labs monthly per GI  Mild cardiomegaly: Check echocardiogram, question diastolic dysfunction contributing to edema.  Hepatitis A continues to improve with normalizing.  Continue to hold Crestor  Follow up: Follow-up 6 months Visit date not found  Orders Placed This Encounter  Procedures  . TSH   No orders of the defined types were placed in this encounter.     I reviewed the patients updated PMH, FH, and SocHx.    Patient Active Problem List   Diagnosis Date Noted  . Acquired hypothyroidism 02/05/2011    Priority: High  . Adenomatous colon polyp 02/05/2011    Priority: High  . Mixed hyperlipidemia 02/05/2011    Priority: High  . Osteoarthritis of finger of left hand 01/07/2017    Priority: Medium  . Leukopenia 08/09/2012    Priority: Medium  . Trigger finger, left ring finger 01/07/2017    Priority: Low  . Male erectile disorder 02/09/2012    Priority: Low  . Pancytopenia ([place]) 03/27/2019  . Non-traumatic rhabdomyolysis 02/21/2019   Current Meds  Medication Sig  . acetaminophen (TYLENOL) 325 MG tablet Take 650 mg by mouth as needed.  . Cholecalciferol (VITAMIN D3) 5000 units TABS Take 1 tablet by mouth daily.   . cyclobenzaprine (FLEXERIL) 10 MG tablet Take 1 tablet (10 mg total) by mouth as needed.  . folic acid (FOLVITE) 1 MG tablet Take  1 tablet (1 mg total) by mouth daily.  . Glucosamine HCl 1000 MG TABS Take 2 tablets by mouth daily.  Marland Kitchen levothyroxine (SYNTHROID) 150 MCG tablet Take 1 tablet (150 mcg total) by mouth daily before breakfast.  . Multiple Vitamin (MULTIVITAMIN) capsule Take 1 capsule by mouth daily.   . Omega-3 1000 MG CAPS Take 1 tablet by mouth daily.  . sildenafil (REVATIO) 20 MG tablet Use 5 tablets PO as needed  . sodium chloride 1 g tablet Take 1 g by mouth 3 (three) times daily.    Allergies: Patient is allergic to rosuvastatin. Family History:  Patient family history includes Alzheimer's disease in his mother; Arthritis in his mother; Cancer in his brother and sister; Dementia in his mother; Heart disease in his father and paternal grandmother; Hyperlipidemia in his brother; Stroke in his brother and mother. Social History:  Patient  reports that he has quit smoking. He has never used smokeless tobacco. He reports previous alcohol use. He reports that he does not use drugs.  Review of Systems: Constitutional: Negative for fever malaise or anorexia Cardiovascular: negative for chest pain Respiratory: negative for SOB or persistent cough Gastrointestinal: negative for abdominal pain  Objective  Vitals: BP (!) 124/7   Pulse 80   Temp 98.1 F (36.7 C) (Oral)   Resp 16   Ht _0  (1.854 m)   Wt 208 lb 12.8 oz (94.7 kg)   SpO2 97%   BMI 27.55 kg/m  General: no acute distress , A&Ox3 looks well HEENT: PEERL, conjunctiva normal, Oropharynx moist,neck is supple Cardiovascular:  RRR without murmur or gallop.  Trace bilateral lower extremity edema Respiratory:  Good breath sounds bilaterally, CTAB with normal respiratory effort Skin:  Warm, no rashes, no jaundice  No visits with results within 1 Day(s) from this visit.  Latest known visit with results is:  Lab on 06/04/2019  Component Date Value Ref Range Status  . INR 06/04/2019 1.3* 0.8 - 1.0 ratio Final  . Prothrombin Time 06/04/2019 15.4* 9.6 - 13.1 sec Final  . Sodium 06/04/2019 135  135 - 145 mEq/L Final  . Potassium 06/04/2019 4.1  3.5 - 5.1 mEq/L Final  . Chloride 06/04/2019 106  96 - 112 mEq/L Final  . CO2 06/04/2019 22  19 - 32 mEq/L Final  . Glucose, Bld 06/04/2019 95  70 - 99 mg/dL Final  . BUN 06/04/2019 17  6 - 23 mg/dL Final  . Creatinine, Ser 06/04/2019 0.74  0.40 - 1.50 mg/dL Final  . Total Bilirubin 06/04/2019 1.3* 0.2 - 1.2 mg/dL Final  . Alkaline Phosphatase 06/04/2019 134* 39 - 117 U/L Final  . AST 06/04/2019 62* 0 - 37 U/L Final  . ALT 06/04/2019 43  0  - 53 U/L Final  . Total Protein 06/04/2019 7.5  6.0 - 8.3 g/dL Final  . Albumin 06/04/2019 3.0* 3.5 - 5.2 g/dL Final  . Calcium 06/04/2019 9.3  8.4 - 10.5 mg/dL Final  . GFR 06/04/2019 103.44  >60.00 mL/min Final  . WBC 06/04/2019 3.0* 4.0 - 10.5 K/uL Final  . RBC 06/04/2019 3.04* 4.22 - 5.81 Mil/uL Final  . Hemoglobin 06/04/2019 11.5* 13.0 - 17.0 g/dL Final  . HCT 06/04/2019 32.6* 39.0 - 52.0 % Final  . MCV 06/04/2019 107.2* 78.0 - 100.0 fl Final  . MCHC 06/04/2019 35.2  30.0 - 36.0 g/dL Final  . RDW 06/04/2019 12.8  11.5 - 15.5 % Final  . Platelets 06/04/2019 98.0* 150.0 - 400.0 K/uL Final  . Neutrophils  Relative % 06/04/2019 54.3  43.0 - 77.0 % Final  . Lymphocytes Relative 06/04/2019 29.0  12.0 - 46.0 % Final  . Monocytes Relative 06/04/2019 13.9* 3.0 - 12.0 % Final  . Eosinophils Relative 06/04/2019 2.1  0.0 - 5.0 % Final  . Basophils Relative 06/04/2019 0.7  0.0 - 3.0 % Final  . Neutro Abs 06/04/2019 1.6  1.4 - 7.7 K/uL Final  . Lymphs Abs 06/04/2019 0.9  0.7 - 4.0 K/uL Final  . Monocytes Absolute 06/04/2019 0.4  0.1 - 1.0 K/uL Final  . Eosinophils Absolute 06/04/2019 0.1  0.0 - 0.7 K/uL Final  . Basophils Absolute 06/04/2019 0.0  0.0 - 0.1 K/uL Final     Commons side effects, risks, benefits, and alternatives for medications and treatment plan prescribed today were discussed, and the patient expressed understanding of the given instructions. Patient is instructed to call or message via MyChart if he/she has any questions or concerns regarding our treatment plan. No barriers to understanding were identified. We discussed Red Flag symptoms and signs in detail. Patient expressed understanding regarding what to do in case of urgent or emergency type symptoms.   Medication list was reconciled, printed and provided to the patient in AVS. Patient instructions and summary information was reviewed with the patient as documented in the AVS. This note was prepared with assistance of Dragon  voice recognition software. Occasional wrong-word or sound-a-like substitutions may have occurred due to the inherent limitations of voice recognition software

## 2019-06-21 NOTE — Patient Instructions (Addendum)
Please call [facility] [phone] to schedule your Echocardiogram. This is an ultrasound of your heart to check it's functioning.   Please return in 6 months for recheck.   Ill let you know about your thyroid tests. So glad you are doing so much better.  Call the hematologist to clarify things.   If you have any questions or concerns, please don't hesitate to send me a message via MyChart or call the office at [phone]. Thank you for visiting with [place] today! It's our pleasure caring for you.

## 2019-06-22 MED ORDER — LEVOTHYROXINE SODIUM 137 MCG PO TABS: 137.0000 ug | ORAL_TABLET | Freq: Every day | ORAL | 0 refills | Status: DC

## 2019-06-22 NOTE — Telephone Encounter (Unsigned)
Copied from [place] [id number]. Topic: General - Other >> Jun 22, 2019 10:59 AM Percell Belt A wrote: Reason for CRM: pt called in stated that he  was return Tierra's call. Per office she was in with a pt.  Best number to contact pt is [phone]

## 2019-06-22 NOTE — Addendum Note (Signed)
Addended by: Billey Chang on: 06/22/2019 08:25 AM   Modules accepted: Orders

## 2019-06-25 ENCOUNTER — Other Ambulatory Visit: Payer: Self-pay

## 2019-06-25 ENCOUNTER — Ambulatory Visit (HOSPITAL_COMMUNITY)
Admission: RE | Admit: 2019-06-25 | Discharge: 2019-06-25 | Disposition: A | Discharge status: Home / Self Care | Payer: Medicare Other | Source: Ambulatory Visit | Attending: Family Medicine | Admitting: Family Medicine

## 2019-06-25 DIAGNOSIS — I358 Other nonrheumatic aortic valve disorders: Secondary | ICD-10-CM | POA: Insufficient documentation

## 2019-06-25 DIAGNOSIS — I517 Cardiomegaly: Secondary | ICD-10-CM | POA: Insufficient documentation

## 2019-06-25 DIAGNOSIS — R600 Localized edema: Secondary | ICD-10-CM | POA: Diagnosis not present

## 2019-06-25 DIAGNOSIS — R5383 Other fatigue: Secondary | ICD-10-CM | POA: Diagnosis not present

## 2019-06-25 DIAGNOSIS — E785 Hyperlipidemia, unspecified: Secondary | ICD-10-CM | POA: Diagnosis not present

## 2019-06-25 NOTE — Progress Notes (Signed)
  Echocardiogram 2D Echocardiogram has been performed.  Timothy Grant 06/25/2019, 12:16 PM

## 2019-07-02 ENCOUNTER — Other Ambulatory Visit (INDEPENDENT_AMBULATORY_CARE_PROVIDER_SITE_OTHER): Payer: Medicare Other

## 2019-07-02 DIAGNOSIS — B159 Hepatitis A without hepatic coma: Secondary | ICD-10-CM | POA: Diagnosis not present

## 2019-07-02 LAB — COMPREHENSIVE METABOLIC PANEL
ALT: 32 U/L (ref 0–53)
AST: 45 U/L — ABNORMAL HIGH (ref 0–37)
Albumin: 3.3 g/dL — ABNORMAL LOW (ref 3.5–5.2)
Alkaline Phosphatase: 122 U/L — ABNORMAL HIGH (ref 39–117)
BUN: 18 mg/dL (ref 6–23)
CO2: 26 mEq/L (ref 19–32)
Calcium: 10 mg/dL (ref 8.4–10.5)
Chloride: 103 mEq/L (ref 96–112)
Creatinine, Ser: 0.84 mg/dL (ref 0.40–1.50)
GFR: 89.34 mL/min (ref >60.00)
Glucose, Bld: 103 mg/dL — ABNORMAL HIGH (ref 70–99)
Potassium: 4.6 mEq/L (ref 3.5–5.1)
Sodium: 134 mEq/L — ABNORMAL LOW (ref 135–145)
Total Bilirubin: 0.9 mg/dL (ref 0.2–1.2)
Total Protein: 7.6 g/dL (ref 6.0–8.3)

## 2019-07-02 LAB — CBC WITH DIFFERENTIAL/PLATELET
Basophils Absolute: 0 10*3/uL (ref 0.0–0.1)
Basophils Relative: 0.7 % (ref 0.0–3.0)
Eosinophils Absolute: 0.1 10*3/uL (ref 0.0–0.7)
Eosinophils Relative: 2.6 % (ref 0.0–5.0)
HCT: 35.7 % — ABNORMAL LOW (ref 39.0–52.0)
Hemoglobin: 12.6 g/dL — ABNORMAL LOW (ref 13.0–17.0)
Lymphocytes Relative: 29.3 % (ref 12.0–46.0)
Lymphs Abs: 1.1 10*3/uL (ref 0.7–4.0)
MCHC: 35.3 g/dL (ref 30.0–36.0)
MCV: 104.3 fl — ABNORMAL HIGH (ref 78.0–100.0)
Monocytes Absolute: 0.4 10*3/uL (ref 0.1–1.0)
Monocytes Relative: 12 % (ref 3.0–12.0)
Neutro Abs: 2 10*3/uL (ref 1.4–7.7)
Neutrophils Relative %: 55.4 % (ref 43.0–77.0)
Platelets: 100 10*3/uL — ABNORMAL LOW (ref 150.0–400.0)
RBC: 3.42 Mil/uL — ABNORMAL LOW (ref 4.22–5.81)
RDW: 12.7 % (ref 11.5–15.5)
WBC: 3.6 10*3/uL — ABNORMAL LOW (ref 4.0–10.5)

## 2019-07-02 LAB — PROTIME-INR
INR: 1.2 ratio — ABNORMAL HIGH (ref 0.8–1.0)
Prothrombin Time: 13.5 s — ABNORMAL HIGH (ref 9.6–13.1)

## 2019-07-03 ENCOUNTER — Encounter: Payer: Self-pay | Admitting: *Deleted

## 2019-07-09 ENCOUNTER — Other Ambulatory Visit: Payer: Self-pay | Admitting: Hematology

## 2019-07-09 DIAGNOSIS — D539 Nutritional anemia, unspecified: Secondary | ICD-10-CM

## 2019-07-09 DIAGNOSIS — D72819 Decreased white blood cell count, unspecified: Secondary | ICD-10-CM

## 2019-07-09 DIAGNOSIS — E039 Hypothyroidism, unspecified: Secondary | ICD-10-CM

## 2019-07-09 DIAGNOSIS — D696 Thrombocytopenia, unspecified: Secondary | ICD-10-CM | POA: Insufficient documentation

## 2019-07-09 NOTE — Progress Notes (Addendum)
Timothy Grant NOTE  Patient Care Team: [name], MD as PCP - General (Family Medicine) Stephannie Li, [place] (Ophthalmology) Daryll Brod, MD as Consulting Physician (Orthopedic Surgery) [place] (Dentistry) Juanita Craver, MD as Consulting Physician (Gastroenterology) Thornton Park, MD as Consulting Physician (Gastroenterology)  HEME/ONC OVERVIEW: 1. Pancytopenia  -Previous patient of Dr. Walden Field  -Likely due to cirrhosis  -WBC between 3 and 4k w/ nl diff, plts bewteen 140-160k's since 2009; Hgb relatively normal -New anemia and thrombocytopenia since 12/2018 after being diagnosed with severe acute Hep A infection  PERTINENT NON-HEM/ONC PROBLEMS: 1. Severe acute Hepatitis A infection -01/2019: Tbili 17, AST > 1000, ALT 820; liver bx at [facility] showed severe acute hepatitis with focal areas of necrosis, mild early portal/peri-portal fibrosis  -02/2019: abdominal US showed liver cirrhosis w/ splenomegaly, large volume ascites   ASSESSMENT & PLAN:   Leukopenia -I reviewed the patient's records in detail, including previous hematologist clinic notes, lab studies, imaging results and pathology reports -Patient was previously followed by Dr. Walden Field for pancytopenia, and is now transferring his care to me after Dr. Walden Field has left practice.  In summary, patient developed severe acute Hepatitis A in 01/2019 after his labs were notable for severe hyperbilirubinemia (Tbili > 17), AST > 1000 and ALT ~800.  Liver biopsy showed severe acute hepatitis with focal areas of necrosis, mild early portal/periportal fibrosis.  He is followed closely by transplant hepatology at [facility].  He was referred to Dr. Walden Field in 03/2019 for evaluation of pancytopenia, which was thought to be caused by severe liver dysfunction.  Reviewed the patient's labs dating back to 2009 showed WBC fluctuating between 3 and 4k w/ normal diff. -Clinically, patient denies any history of recurrent or frequent infections,  requiring IV abx or hospitalization until the recent episode of severe acute Hepatitis A -WBC 3.6k today with ANC 2000, stable  -I personally reviewed the patient's peripheral blood smear today.  The red blood cells were slightly enlarged in size but the morphology was normal.  There was no schistocytosis.  The white blood cells were of normal morphology. There were no peripheral circulating blasts. The platelets were of normal size and I verified that there were no platelet clumping. -I discussed with the patient that given his history of mild intermittent leukopenia, the worsening leukopenia is most likely due to the recent severe acute Hepatitis A infection and accompanied cirrhosis (demonstrated on abdominal US in 02/2019) -While MDS cannot be ruled out based on lab studies alone, the clinical presentation, including acute worsening of cytopenias during the acute hepatitis infection, suggests that the cytopenias are most likely related to the liver disease  -Unless he develops severe leukopenia w/ neutropenia or clinically suspicious symptoms, I do not think that bone marrow bx would change his management, unless he requires liver transplant and bone marrow biopsy would affect his transplant eligibility  Macrocytic anemia  -Most likely secondary to the underlying liver disease -B12 and MMA level normal -Hgb 12.4 with MCV 103 today, stable  -I have ordered folate and thyroid function studies, which can also lead to macrocytosis  -Continue folic acid 73m daily for now   Thrombocytopenia -Most likely secondary to the underlying liver disease -Abdominal [facility] 02/2019 showed liver cirrhosis with splenomegaly -Plts 94k today, stable  -Clinically, patient denies any symptoms of bleeding or abnormal bruising -In the absence of clinically significant bleeding or bruising, there is no indication for pharmacologic treatment for thrombocytopenia secondary to cirrhosis -We will monitor it for  now  Orders Placed This Encounter  Procedures  . CBC with Differential (Cancer Center Only)    Standing Status:   Future    Standing Expiration Date:   08/13/2020  . CMP ([place] only)    Standing Status:   Future    Standing Expiration Date:   08/13/2020  . Save Smear ([facility])    Standing Status:   Future    Standing Expiration Date:   07/09/2020  . Lactate dehydrogenase    Standing Status:   Future    Standing Expiration Date:   08/13/2020   All questions were answered. The patient knows to call the clinic with any problems, questions or concerns.  Return in 6 months for labs and clinic follow-up.   Tish Men, MD 07/10/2019 1:24 PM   CHIEF COMPLAINTS:  "I am doing fine*"  INTERVAL HISTORY:  Mr. Barringer returns to clinic for follow-up of pancytopenia.  Patient was previously followed by Dr. Walden Field, who has left the practice.  Patient reports that he has been doing well since the last clinic visit.  He denies any constitutional symptoms, chest pain, dyspnea, abdominal pain, nausea, vomiting, or abnormal bleeding/bruising.  MEDICAL HISTORY:  Past Medical History:  Diagnosis Date  . Adenomatous polyps   . ED (erectile dysfunction)   . Hepatitis A   . Hyperlipidemia   . Hypothyroidism   . Varicocele 02/01/2011   Left testicle    SURGICAL HISTORY: Past Surgical History:  Procedure Laterality Date  . EYE SURGERY Right 1956  . EYE SURGERY Left 1963  . LIVER BIOPSY    . TONSILLECTOMY      SOCIAL HISTORY: Social History   Socioeconomic History  . Marital status: Married    Spouse name: Not on file  . Number of children: Not on file  . Years of education: Not on file  . Highest education level: Not on file  Occupational History  . Not on file  Social Needs  . Financial resource strain: Not on file  . Food insecurity    Worry: Not on file    Inability: Not on file  . Transportation needs    Medical: Not on file    Non-medical: Not on file  Tobacco Use  .  Smoking status: Former Research scientist (life sciences)  . Smokeless tobacco: Never Used  . Tobacco comment: quit 1975  Substance and Sexual Activity  . Alcohol use: Not Currently    Comment: stopped January 2020  . Drug use: No  . Sexual activity: Yes  Lifestyle  . Physical activity    Days per week: Not on file    Minutes per session: Not on file  . Stress: Not on file  Relationships  . Social Herbalist on phone: Not on file    Gets together: Not on file    Attends religious service: Not on file    Active member of club or organization: Not on file    Attends meetings of clubs or organizations: Not on file    Relationship status: Not on file  . Intimate partner violence    Fear of current or ex partner: Not on file    Emotionally abused: Not on file    Physically abused: Not on file    Forced sexual activity: Not on file  Other Topics Concern  . Not on file  Social History Narrative  . Not on file    FAMILY HISTORY: Family History  Problem Relation Age of Onset  .  Alzheimer's disease Mother   . Arthritis Mother   . Dementia Mother   . Stroke Mother   . Heart disease Father   . Cancer Sister        uterine  . Hyperlipidemia Brother   . Stroke Brother   . Cancer Brother   . Heart disease Paternal Grandmother   . Colon cancer Neg Hx   . Esophageal cancer Neg Hx     ALLERGIES:  is allergic to rosuvastatin.  MEDICATIONS:  Current Outpatient Medications  Medication Sig Dispense Refill  . acetaminophen (TYLENOL) 325 MG tablet Take 650 mg by mouth as needed.    . Cholecalciferol (VITAMIN D3) 5000 units TABS Take 1 tablet by mouth daily.     . cyclobenzaprine (FLEXERIL) 10 MG tablet Take 1 tablet (10 mg total) by mouth as needed. 30 tablet 2  . folic acid (FOLVITE) 1 MG tablet Take 1 tablet (1 mg total) by mouth daily. 90 tablet 3  . Glucosamine HCl 1000 MG TABS Take 2 tablets by mouth daily.    Marland Kitchen levothyroxine (SYNTHROID) 137 MCG tablet Take 1 tablet (137 mcg total) by mouth  daily before breakfast. 90 tablet 0  . Multiple Vitamin (MULTIVITAMIN) capsule Take 1 capsule by mouth daily.     . Omega-3 1000 MG CAPS Take 1 tablet by mouth daily.    . sildenafil (REVATIO) 20 MG tablet Use 5 tablets PO as needed 100 tablet 3  . sodium chloride 1 g tablet Take 1 g by mouth 3 (three) times daily.     No current facility-administered medications for this visit.     REVIEW OF SYSTEMS:   Constitutional: ( - ) fevers, ( - )  chills , ( - ) night sweats Eyes: ( - ) blurriness of vision, ( - ) double vision, ( - ) watery eyes Ears, nose, mouth, throat, and face: ( - ) mucositis, ( - ) sore throat Respiratory: ( - ) cough, ( - ) dyspnea, ( - ) wheezes Cardiovascular: ( - ) palpitation, ( - ) chest discomfort, ( - ) lower extremity swelling Gastrointestinal:  ( - ) nausea, ( - ) heartburn, ( - ) change in bowel habits Skin: ( - ) abnormal skin rashes Lymphatics: ( - ) new lymphadenopathy, ( - ) easy bruising Neurological: ( - ) numbness, ( - ) tingling, ( - ) new weaknesses Behavioral/Psych: ( - ) mood change, ( - ) new changes  All other systems were reviewed with the patient and are negative.  PHYSICAL EXAMINATION: ECOG PERFORMANCE STATUS: 1 - Symptomatic but completely ambulatory  Vitals:   07/10/19 1220  BP: 111/66  Pulse: 74  Resp: 17  SpO2: 99%   Filed Weights   07/10/19 1220  Weight: 211 lb 6.4 oz (95.9 kg)    GENERAL: alert, no distress and comfortable SKIN: skin color, texture, turgor are normal, no rashes or significant lesions EYES: conjunctiva are pink and non-injected, sclera clear OROPHARYNX: no exudate, no erythema; lips, buccal mucosa, and tongue normal  NECK: supple, non-tender LUNGS: clear to auscultation with normal breathing effort HEART: regular rate & rhythm, no murmurs, no lower extremity edema ABDOMEN: soft, non-tender, non-distended, normal bowel sounds Musculoskeletal: no cyanosis of digits and no clubbing  PSYCH: alert & oriented x  3, fluent speech NEURO: no focal motor/sensory deficits  LABORATORY DATA:  I have reviewed the data as listed Lab Results  Component Value Date   WBC 3.6 (L) 07/10/2019   HGB 12.4 (L)  07/10/2019   HCT 35.7 (L) 07/10/2019   MCV 103.8 (H) 07/10/2019   PLT 94 (L) 07/10/2019   Lab Results  Component Value Date   NA 134 (L) 07/10/2019   K 4.2 07/10/2019   CL 102 07/10/2019   CO2 27 07/10/2019    RADIOGRAPHIC STUDIES: I have personally reviewed the radiological images as listed and agreed with the findings in the report. No results found.  PATHOLOGY: I have reviewed the pathology reports as documented in the oncologist history.

## 2019-07-10 ENCOUNTER — Encounter: Payer: Self-pay | Admitting: Hematology

## 2019-07-10 ENCOUNTER — Inpatient Hospital Stay: Payer: Medicare Other

## 2019-07-10 ENCOUNTER — Inpatient Hospital Stay: Payer: Medicare Other | Attending: Internal Medicine | Admitting: Hematology

## 2019-07-10 ENCOUNTER — Telehealth: Payer: Self-pay | Admitting: Hematology

## 2019-07-10 ENCOUNTER — Other Ambulatory Visit: Payer: Self-pay

## 2019-07-10 VITALS — BP 111/66 | HR 74 | Resp 17 | Ht 73.0 in | Wt 211.4 lb

## 2019-07-10 DIAGNOSIS — E039 Hypothyroidism, unspecified: Secondary | ICD-10-CM

## 2019-07-10 DIAGNOSIS — D72819 Decreased white blood cell count, unspecified: Secondary | ICD-10-CM | POA: Diagnosis not present

## 2019-07-10 DIAGNOSIS — B159 Hepatitis A without hepatic coma: Secondary | ICD-10-CM | POA: Insufficient documentation

## 2019-07-10 DIAGNOSIS — R161 Splenomegaly, not elsewhere classified: Secondary | ICD-10-CM | POA: Diagnosis not present

## 2019-07-10 DIAGNOSIS — D61818 Other pancytopenia: Secondary | ICD-10-CM | POA: Insufficient documentation

## 2019-07-10 DIAGNOSIS — D696 Thrombocytopenia, unspecified: Secondary | ICD-10-CM

## 2019-07-10 DIAGNOSIS — E785 Hyperlipidemia, unspecified: Secondary | ICD-10-CM | POA: Diagnosis not present

## 2019-07-10 DIAGNOSIS — D539 Nutritional anemia, unspecified: Secondary | ICD-10-CM

## 2019-07-10 DIAGNOSIS — K7460 Unspecified cirrhosis of liver: Secondary | ICD-10-CM | POA: Diagnosis not present

## 2019-07-10 LAB — CMP (CANCER CENTER ONLY)
ALT: 29 U/L (ref 0–44)
AST: 42 U/L — ABNORMAL HIGH (ref 15–41)
Albumin: 3.3 g/dL — ABNORMAL LOW (ref 3.5–5.0)
Alkaline Phosphatase: 116 U/L (ref 38–126)
Anion gap: 5 (ref 5–15)
BUN: 19 mg/dL (ref 8–23)
CO2: 27 mmol/L (ref 22–32)
Calcium: 9.5 mg/dL (ref 8.9–10.3)
Chloride: 102 mmol/L (ref 98–111)
Creatinine: 0.72 mg/dL (ref 0.61–1.24)
GFR, Est AFR Am: >60 mL/min (ref >60)
GFR, Estimated: >60 mL/min (ref >60)
Glucose, Bld: 91 mg/dL (ref 70–99)
Potassium: 4.2 mmol/L (ref 3.5–5.1)
Sodium: 134 mmol/L — ABNORMAL LOW (ref 135–145)
Total Bilirubin: 0.8 mg/dL (ref 0.3–1.2)
Total Protein: 7.3 g/dL (ref 6.5–8.1)

## 2019-07-10 LAB — T4, FREE: Free T4: 1.24 ng/dL — ABNORMAL HIGH (ref 0.61–1.12)

## 2019-07-10 LAB — CBC WITH DIFFERENTIAL (CANCER CENTER ONLY)
Abs Immature Granulocytes: 0.01 10*3/uL (ref 0.00–0.07)
Basophils Absolute: 0 10*3/uL (ref 0.0–0.1)
Basophils Relative: 0 %
Eosinophils Absolute: 0.1 10*3/uL (ref 0.0–0.5)
Eosinophils Relative: 2 %
HCT: 35.7 % — ABNORMAL LOW (ref 39.0–52.0)
Hemoglobin: 12.4 g/dL — ABNORMAL LOW (ref 13.0–17.0)
Immature Granulocytes: 0 %
Lymphocytes Relative: 32 %
Lymphs Abs: 1.2 10*3/uL (ref 0.7–4.0)
MCH: 36 pg — ABNORMAL HIGH (ref 26.0–34.0)
MCHC: 34.7 g/dL (ref 30.0–36.0)
MCV: 103.8 fL — ABNORMAL HIGH (ref 80.0–100.0)
Monocytes Absolute: 0.4 10*3/uL (ref 0.1–1.0)
Monocytes Relative: 10 %
Neutro Abs: 2 10*3/uL (ref 1.7–7.7)
Neutrophils Relative %: 56 %
Platelet Count: 94 10*3/uL — ABNORMAL LOW (ref 150–400)
RBC: 3.44 MIL/uL — ABNORMAL LOW (ref 4.22–5.81)
RDW: 12.3 % (ref 11.5–15.5)
WBC Count: 3.6 10*3/uL — ABNORMAL LOW (ref 4.0–10.5)
nRBC: 0 % (ref 0.0–0.2)

## 2019-07-10 LAB — FOLATE: Folate: 61.1 ng/mL (ref >5.9)

## 2019-07-10 LAB — LACTATE DEHYDROGENASE: LDH: 201 U/L — ABNORMAL HIGH (ref 98–192)

## 2019-07-10 LAB — SAVE SMEAR(SSMR), FOR PROVIDER SLIDE REVIEW

## 2019-07-10 NOTE — Telephone Encounter (Signed)
Called and spoke with patient regarding appointments added per 8/4 los  °

## 2019-07-11 LAB — TSH: TSH: 0.095 u[IU]/mL — ABNORMAL LOW (ref 0.320–4.118)

## 2019-07-12 ENCOUNTER — Encounter: Payer: Self-pay | Admitting: Family Medicine

## 2019-07-12 ENCOUNTER — Telehealth: Payer: Self-pay

## 2019-07-12 ENCOUNTER — Telehealth: Payer: Self-pay | Admitting: *Deleted

## 2019-07-12 LAB — COPPER, SERUM: Copper: 125 ug/dL (ref 72–166)

## 2019-07-12 NOTE — Telephone Encounter (Signed)
Notified pt of lab results. Pt instructed to call PCP office regarding Synthroid management. Pt asked if I could call office as he was unsure what he should do, " my PCP recently lowered my synthroid dose" Called PCP office left message that pt will be calling regarding recent lab results and management of Synthroid.

## 2019-07-12 NOTE — Telephone Encounter (Signed)
Noted  

## 2019-07-12 NOTE — Telephone Encounter (Signed)
-----   Message from Tish Men, MD sent at 07/12/2019  8:21 AM EDT ----- Graceann Congress, Can you let Timothy Grant know that his thyroid function shows hyperthyroidism, indicating that he is taking more Synthroid than he needs? He needs to see his PCP for further management of his Synthroid prescription.  Thanks.  [place] ----- Message ----- From: Buel Ream, Lab In [place] Sent: 07/10/2019  12:18 PM EDT To: Tish Men, MD

## 2019-07-12 NOTE — Telephone Encounter (Signed)
Copied from [place] [id number]. Topic: General - Other >> Jul 12, 2019 10:30 AM Celene Kras A wrote: Reason for CRM: Stanton Kidney, from cancer center, called stating the pts thyroid is still elevated. She states the pt will e calling [phone]

## 2019-07-30 ENCOUNTER — Other Ambulatory Visit (INDEPENDENT_AMBULATORY_CARE_PROVIDER_SITE_OTHER): Payer: Medicare Other

## 2019-07-30 DIAGNOSIS — B159 Hepatitis A without hepatic coma: Secondary | ICD-10-CM

## 2019-07-30 LAB — COMPREHENSIVE METABOLIC PANEL
ALT: 27 U/L (ref 0–53)
AST: 40 U/L — ABNORMAL HIGH (ref 0–37)
Albumin: 3.7 g/dL (ref 3.5–5.2)
Alkaline Phosphatase: 120 U/L — ABNORMAL HIGH (ref 39–117)
BUN: 14 mg/dL (ref 6–23)
CO2: 25 mEq/L (ref 19–32)
Calcium: 9.9 mg/dL (ref 8.4–10.5)
Chloride: 102 mEq/L (ref 96–112)
Creatinine, Ser: 0.92 mg/dL (ref 0.40–1.50)
GFR: 80.42 mL/min (ref >60.00)
Glucose, Bld: 127 mg/dL — ABNORMAL HIGH (ref 70–99)
Potassium: 4 mEq/L (ref 3.5–5.1)
Sodium: 135 mEq/L (ref 135–145)
Total Bilirubin: 0.8 mg/dL (ref 0.2–1.2)
Total Protein: 7.6 g/dL (ref 6.0–8.3)

## 2019-07-30 LAB — CBC WITH DIFFERENTIAL/PLATELET
Basophils Absolute: 0 10*3/uL (ref 0.0–0.1)
Basophils Relative: 1 % (ref 0.0–3.0)
Eosinophils Absolute: 0.1 10*3/uL (ref 0.0–0.7)
Eosinophils Relative: 3.3 % (ref 0.0–5.0)
HCT: 37.4 % — ABNORMAL LOW (ref 39.0–52.0)
Hemoglobin: 12.9 g/dL — ABNORMAL LOW (ref 13.0–17.0)
Lymphocytes Relative: 30.3 % (ref 12.0–46.0)
Lymphs Abs: 1.2 10*3/uL (ref 0.7–4.0)
MCHC: 34.5 g/dL (ref 30.0–36.0)
MCV: 103.1 fl — ABNORMAL HIGH (ref 78.0–100.0)
Monocytes Absolute: 0.4 10*3/uL (ref 0.1–1.0)
Monocytes Relative: 10.9 % (ref 3.0–12.0)
Neutro Abs: 2.1 10*3/uL (ref 1.4–7.7)
Neutrophils Relative %: 54.5 % (ref 43.0–77.0)
Platelets: 111 10*3/uL — ABNORMAL LOW (ref 150.0–400.0)
RBC: 3.63 Mil/uL — ABNORMAL LOW (ref 4.22–5.81)
RDW: 12.5 % (ref 11.5–15.5)
WBC: 3.9 10*3/uL — ABNORMAL LOW (ref 4.0–10.5)

## 2019-07-30 LAB — PROTIME-INR
INR: 1.2 ratio — ABNORMAL HIGH (ref 0.8–1.0)
Prothrombin Time: 14.1 s — ABNORMAL HIGH (ref 9.6–13.1)

## 2019-08-14 ENCOUNTER — Encounter: Payer: Self-pay | Admitting: Family Medicine

## 2019-08-14 ENCOUNTER — Ambulatory Visit (INDEPENDENT_AMBULATORY_CARE_PROVIDER_SITE_OTHER): Payer: Medicare Other | Admitting: Family Medicine

## 2019-08-14 ENCOUNTER — Other Ambulatory Visit: Payer: Self-pay

## 2019-08-14 VITALS — BP 106/70 | HR 57 | Temp 97.7°F | Resp 16 | Ht 73.0 in | Wt 215.2 lb

## 2019-08-14 DIAGNOSIS — E039 Hypothyroidism, unspecified: Secondary | ICD-10-CM

## 2019-08-14 DIAGNOSIS — Z23 Encounter for immunization: Secondary | ICD-10-CM

## 2019-08-14 DIAGNOSIS — E871 Hypo-osmolality and hyponatremia: Secondary | ICD-10-CM

## 2019-08-14 DIAGNOSIS — B159 Hepatitis A without hepatic coma: Secondary | ICD-10-CM

## 2019-08-14 LAB — TSH: TSH: 0.29 u[IU]/mL — ABNORMAL LOW (ref 0.35–4.50)

## 2019-08-14 NOTE — Progress Notes (Signed)
Subjective  CC:  Chief Complaint  Patient presents with  . Hypothyroidism  . Labs Only    HPI: Timothy Grant is a 73 y.o. male who presents to the office today to address the problems listed above in the chief complaint.  hypothyroidism:  F/u after lowering dose about 6-8 weeks ago. Feeling well. No sxs of low or high thyroid. He is compliant with meds.  Assessment  1. Acquired hypothyroidism   2. Viral hepatitis A without hepatic coma   3. Hyponatremia      Plan   Recheck tsh and adjust meds as needed. Clinically euthyroid  Monitoring sodium. Continue sodium tab for next month. Should be able to stop in near future.  Edema/hep A/liver insuf: all improved  Lab Results  Component Value Date   TSH 0.095 (L) 07/10/2019    Hyponatremia: now in normal range on one sodium tab daily and no fluid restriction. No more edema  Hep A with almost normal lfts now. Albumin now in low normal range.  Lab Results  Component Value Date   CREATININE 0.92 07/30/2019   BUN 14 07/30/2019   NA 135 07/30/2019   K 4.0 07/30/2019   CL 102 07/30/2019   CO2 25 07/30/2019   Lab Results  Component Value Date   ALT 27 07/30/2019   AST 40 (H) 07/30/2019   ALKPHOS 120 (H) 07/30/2019   BILITOT 0.8 07/30/2019     Follow up: Return in about 3 months (around 11/13/2019) for recheck.  Visit date not found  Orders Placed This Encounter  Procedures  . TSH   No orders of the defined types were placed in this encounter.     I reviewed the patients updated PMH, FH, and SocHx.    Patient Active Problem List   Diagnosis Date Noted  . Acquired hypothyroidism 02/05/2011    Priority: High  . Adenomatous colon polyp 02/05/2011    Priority: High  . Mixed hyperlipidemia 02/05/2011    Priority: High  . Osteoarthritis of finger of left hand 01/07/2017    Priority: Medium  . Leukopenia 08/09/2012    Priority: Medium  . Trigger finger, left ring finger 01/07/2017    Priority: Low  . Male  erectile disorder 02/09/2012    Priority: Low  . Macrocytic anemia 07/09/2019  . Thrombocytopenia ([place]) 07/09/2019  . Pancytopenia ([place]) 03/27/2019  . Non-traumatic rhabdomyolysis 02/21/2019   Current Meds  Medication Sig  . acetaminophen (TYLENOL) 325 MG tablet Take 650 mg by mouth as needed.  . Cholecalciferol (VITAMIN D3) 5000 units TABS Take 1 tablet by mouth daily.   . cyclobenzaprine (FLEXERIL) 10 MG tablet Take 1 tablet (10 mg total) by mouth as needed.  . folic acid (FOLVITE) 1 MG tablet Take 1 tablet (1 mg total) by mouth daily.  . Glucosamine HCl 1000 MG TABS Take 2 tablets by mouth daily.  Marland Kitchen levothyroxine (SYNTHROID) 137 MCG tablet Take 1 tablet (137 mcg total) by mouth daily before breakfast.  . Multiple Vitamin (MULTIVITAMIN) capsule Take 1 capsule by mouth daily.   . Omega-3 1000 MG CAPS Take 1 tablet by mouth daily.  . sildenafil (REVATIO) 20 MG tablet Use 5 tablets PO as needed  . sodium chloride 1 g tablet Take 1 g by mouth 3 (three) times daily.    Allergies: Patient is allergic to rosuvastatin. Family History: Patient family history includes Alzheimer's disease in his mother; Arthritis in his mother; Cancer in his brother and sister; Dementia in his mother; Heart  disease in his father and paternal grandmother; Hyperlipidemia in his brother; Stroke in his brother and mother. Social History:  Patient  reports that he has quit smoking. He has never used smokeless tobacco. He reports previous alcohol use. He reports that he does not use drugs.  Review of Systems: Constitutional: Negative for fever malaise or anorexia Cardiovascular: negative for chest pain Respiratory: negative for SOB or persistent cough Gastrointestinal: negative for abdominal pain  Objective  Vitals: BP 106/70   Pulse (!) 57   Temp 97.7 F (36.5 C) (Tympanic)   Resp 16   Ht 6\' 1"  (1.854 m)   Wt 215 lb 3.2 oz (97.6 kg)   SpO2 96%   BMI 28.39 kg/m  General: no acute distress , A&Ox3  HEENT: PEERL, conjunctiva normal, Oropharynx moist,neck is supple Cardiovascular:  RRR without murmur or gallop. No peripheral edema Respiratory:  Good breath sounds bilaterally, CTAB with normal respiratory effort Skin:  Warm, no rashes Neuro:no lid lag, no tremor     Commons side effects, risks, benefits, and alternatives for medications and treatment plan prescribed today were discussed, and the patient expressed understanding of the given instructions. Patient is instructed to call or message via MyChart if he/she has any questions or concerns regarding our treatment plan. No barriers to understanding were identified. We discussed Red Flag symptoms and signs in detail. Patient expressed understanding regarding what to do in case of urgent or emergency type symptoms.   Medication list was reconciled, printed and provided to the patient in AVS. Patient instructions and summary information was reviewed with the patient as documented in the AVS. This note was prepared with assistance of Dragon voice recognition software. Occasional wrong-word or sound-a-like substitutions may have occurred due to the inherent limitations of voice recognition software

## 2019-08-14 NOTE — Patient Instructions (Signed)
Please return in 3 months for recheck.  Medicare recommends an Annual Wellness Visit for all patients. Please schedule this to be done with our Nurse Educator, Timothy Grant. This is an informative "talk" visit; it's goals are to ensure that your health care needs are being met and to give you education regarding avoiding falls, ensuring you are not suffering from depression or problems with memory or thinking, and to educate you on Advance Care Planning. It helps me take good care of you!  I will release your lab results to you on your MyChart account with further instructions. Please reply with any questions.    If you have any questions or concerns, please don't hesitate to send me a message via MyChart or call the office at [phone]. Thank you for visiting with [place] today! It's our pleasure caring for you.

## 2019-08-15 MED ORDER — LEVOTHYROXINE SODIUM 125 MCG PO TABS: 125.0000 ug | ORAL_TABLET | Freq: Every day | ORAL | 3 refills | Status: DC

## 2019-08-15 NOTE — Addendum Note (Signed)
Addended by: Billey Chang on: 08/15/2019 02:59 PM   Modules accepted: Orders

## 2019-08-21 ENCOUNTER — Other Ambulatory Visit: Payer: Self-pay | Admitting: Family Medicine

## 2019-08-22 ENCOUNTER — Other Ambulatory Visit: Payer: Self-pay

## 2019-08-22 ENCOUNTER — Ambulatory Visit (INDEPENDENT_AMBULATORY_CARE_PROVIDER_SITE_OTHER): Payer: Medicare Other

## 2019-08-22 DIAGNOSIS — Z0000 Encounter for general adult medical examination without abnormal findings: Secondary | ICD-10-CM

## 2019-08-22 NOTE — Patient Instructions (Signed)
[name] , Thank you for taking time to come for your Medicare Wellness Visit. I appreciate your ongoing commitment to your health goals. Please review the following plan we discussed and let me know if I can assist you in the future.   Screening recommendations/referrals: Colorectal Screening: last colonoscopy 2015; deferred additional for this year   Vision and Dental Exams: Recommended annual ophthalmology exams for early detection of glaucoma and other disorders of the eye Recommended annual dental exams for proper oral hygiene  Vaccinations: Influenza vaccine: completed  Pneumococcal vaccine: up to date; last 11/04/14 Tdap vaccine: up to date; last 02/05/11 Shingles vaccine: Shingrix completed at pharmacy  Advanced directives: Please bring a copy of your POA (Power of [place]) and/or Living Will to your next appointment.  Goals: Recommend to drink at least 6-8 8oz glasses of water per day.  Next appointment: Please schedule your Annual Wellness Visit with your Nurse Health Advisor in one year.  Preventive Care 62 Years and Older, Male Preventive care refers to lifestyle choices and visits with your health care provider that can promote health and wellness. What does preventive care include?  A yearly physical exam. This is also called an annual well check.  Dental exams once or twice a year.  Routine eye exams. Ask your health care provider how often you should have your eyes checked.  Personal lifestyle choices, including:  Daily care of your teeth and gums.  Regular physical activity.  Eating a healthy diet.  Avoiding tobacco and drug use.  Limiting alcohol use.  Practicing safe sex.  Taking low doses of aspirin every day if recommended by your health care provider..  Taking vitamin and mineral supplements as recommended by your health care provider. What happens during an annual well check? The services and screenings done by your health care provider  during your annual well check will depend on your age, overall health, lifestyle risk factors, and family history of disease. Counseling  Your health care provider may ask you questions about your:  Alcohol use.  Tobacco use.  Drug use.  Emotional well-being.  Home and relationship well-being.  Sexual activity.  Eating habits.  History of falls.  Memory and ability to understand (cognition).  Work and work Statistician. Screening  You may have the following tests or measurements:  Height, weight, and BMI.  Blood pressure.  Lipid and cholesterol levels. These may be checked every 5 years, or more frequently if you are over 46 years old.  Skin check.  Lung cancer screening. You may have this screening every year starting at age 60 if you have a 30-pack-year history of smoking and currently smoke or have quit within the past 15 years.  Fecal occult blood test (FOBT) of the stool. You may have this test every year starting at age 14.  Flexible sigmoidoscopy or colonoscopy. You may have a sigmoidoscopy every 5 years or a colonoscopy every 10 years starting at age 71.  Prostate cancer screening. Recommendations will vary depending on your family history and other risks.  Hepatitis C blood test.  Hepatitis B blood test.  Sexually transmitted disease (STD) testing.  Diabetes screening. This is done by checking your blood sugar (glucose) after you have not eaten for a while (fasting). You may have this done every 1-3 years.  Abdominal aortic aneurysm (AAA) screening. You may need this if you are a current or former smoker.  Osteoporosis. You may be screened starting at age 72 if you are at high risk.  Talk with your health care provider about your test results, treatment options, and if necessary, the need for more tests. Vaccines  Your health care provider may recommend certain vaccines, such as:  Influenza vaccine. This is recommended every year.  Tetanus,  diphtheria, and acellular pertussis (Tdap, Td) vaccine. You may need a Td booster every 10 years.  Zoster vaccine. You may need this after age 66.  Pneumococcal 13-valent conjugate (PCV13) vaccine. One dose is recommended after age 35.  Pneumococcal polysaccharide (PPSV23) vaccine. One dose is recommended after age 7. Talk to your health care provider about which screenings and vaccines you need and how often you need them. This information is not intended to replace advice given to you by your health care provider. Make sure you discuss any questions you have with your health care provider. Document Released: 12/19/2015 Document Revised: 08/11/2016 Document Reviewed: 09/23/2015 Elsevier Interactive Patient Education  2017 [place] Prevention in the Home Falls can cause injuries. They can happen to people of all ages. There are many things you can do to make your home safe and to help prevent falls. What can I do on the outside of my home?  Regularly fix the edges of walkways and driveways and fix any cracks.  Remove anything that might make you trip as you walk through a door, such as a raised step or threshold.  Trim any bushes or trees on the path to your home.  Use bright outdoor lighting.  Clear any walking paths of anything that might make someone trip, such as rocks or tools.  Regularly check to see if handrails are loose or broken. Make sure that both sides of any steps have handrails.  Any raised decks and porches should have guardrails on the edges.  Have any leaves, snow, or ice cleared regularly.  Use sand or salt on walking paths during winter.  Clean up any spills in your garage right away. This includes oil or grease spills. What can I do in the bathroom?  Use night lights.  Install grab bars by the toilet and in the tub and shower. Do not use towel bars as grab bars.  Use non-skid mats or decals in the tub or shower.  If you need to sit down in  the shower, use a plastic, non-slip stool.  Keep the floor dry. Clean up any water that spills on the floor as soon as it happens.  Remove soap buildup in the tub or shower regularly.  Attach bath mats securely with double-sided non-slip rug tape.  Do not have throw rugs and other things on the floor that can make you trip. What can I do in the bedroom?  Use night lights.  Make sure that you have a light by your bed that is easy to reach.  Do not use any sheets or blankets that are too big for your bed. They should not hang down onto the floor.  Have a firm chair that has side arms. You can use this for support while you get dressed.  Do not have throw rugs and other things on the floor that can make you trip. What can I do in the kitchen?  Clean up any spills right away.  Avoid walking on wet floors.  Keep items that you use a lot in easy-to-reach places.  If you need to reach something above you, use a strong step stool that has a grab bar.  Keep electrical cords out of the way.  Do not use floor polish or wax that makes floors slippery. If you must use wax, use non-skid floor wax.  Do not have throw rugs and other things on the floor that can make you trip. What can I do with my stairs?  Do not leave any items on the stairs.  Make sure that there are handrails on both sides of the stairs and use them. Fix handrails that are broken or loose. Make sure that handrails are as long as the stairways.  Check any carpeting to make sure that it is firmly attached to the stairs. Fix any carpet that is loose or worn.  Avoid having throw rugs at the top or bottom of the stairs. If you do have throw rugs, attach them to the floor with carpet tape.  Make sure that you have a light switch at the top of the stairs and the bottom of the stairs. If you do not have them, ask someone to add them for you. What else can I do to help prevent falls?  Wear shoes that:  Do not have high  heels.  Have rubber bottoms.  Are comfortable and fit you well.  Are closed at the toe. Do not wear sandals.  If you use a stepladder:  Make sure that it is fully opened. Do not climb a closed stepladder.  Make sure that both sides of the stepladder are locked into place.  Ask someone to hold it for you, if possible.  Clearly mark and make sure that you can see:  Any grab bars or handrails.  First and last steps.  Where the edge of each step is.  Use tools that help you move around (mobility aids) if they are needed. These include:  Canes.  Walkers.  Scooters.  Crutches.  Turn on the lights when you go into a dark area. Replace any light bulbs as soon as they burn out.  Set up your furniture so you have a clear path. Avoid moving your furniture around.  If any of your floors are uneven, fix them.  If there are any pets around you, be aware of where they are.  Review your medicines with your doctor. Some medicines can make you feel dizzy. This can increase your chance of falling. Ask your doctor what other things that you can do to help prevent falls. This information is not intended to replace advice given to you by your health care provider. Make sure you discuss any questions you have with your health care provider. Document Released: 09/18/2009 Document Revised: 04/29/2016 Document Reviewed: 12/27/2014 Elsevier Interactive Patient Education  2017 [employer].

## 2019-08-22 NOTE — Progress Notes (Signed)
This visit is being conducted via phone call due to the COVID-19 pandemic. This patient has given me verbal consent via phone to conduct this visit, patient states they are participating from their home address. Some vital signs may be absent or patient reported.   Patient identification: identified by name, DOB, and current address.   Subjective:   Timothy Grant is a 73 y.o. male who presents for Medicare Annual/Subsequent preventive examination.  Review of Systems:   Cardiac Risk Factors include: advanced age (>37men, >28 women);male gender   Objective:    Vitals: There were no vitals taken for this visit.  There is no height or weight on file to calculate BMI.  Advanced Directives 08/22/2019 07/10/2019 03/22/2018  Does Patient Have a Medical Advance Directive? Yes Yes Yes  Type of Advance Directive Living will;Healthcare Power of Attorney - Living will;Healthcare Power of Attorney  Does patient want to make changes to medical advance directive? No - Patient declined - -  Copy of [place] in Chart? No - copy requested - No - copy requested    Tobacco Social History   Tobacco Use  Smoking Status Former Smoker  Smokeless Tobacco Never Used  Tobacco Comment   quit 1975     Counseling given: Not Answered Comment: quit 1975   Clinical Intake:  Pre-visit preparation completed: Yes  Pain : No/denies pain  Diabetes: No  How often do you need to have someone help you when you read instructions, pamphlets, or other written materials from your doctor or pharmacy?: 1 - Never  Interpreter Needed?: No  Information entered by :: Denman George LPN  Past Medical History:  Diagnosis Date  . Adenomatous polyps   . ED (erectile dysfunction)   . Hepatitis A   . Hyperlipidemia   . Hypothyroidism   . Varicocele 02/01/2011   Left testicle   Past Surgical History:  Procedure Laterality Date  . EYE SURGERY Right 1956  . EYE SURGERY Left 1963  . LIVER  BIOPSY    . TONSILLECTOMY     Family History  Problem Relation Age of Onset  . Alzheimer's disease Mother   . Arthritis Mother   . Dementia Mother   . Stroke Mother   . Heart disease Father   . Cancer Sister        uterine  . Hyperlipidemia Brother   . Stroke Brother   . Cancer Brother   . Heart disease Paternal Grandmother   . Colon cancer Neg Hx   . Esophageal cancer Neg Hx    Social History   Socioeconomic History  . Marital status: Married    Spouse name: Not on file  . Number of children: Not on file  . Years of education: Not on file  . Highest education level: Not on file  Occupational History  . Not on file  Social Needs  . Financial resource strain: Not on file  . Food insecurity    Worry: Not on file    Inability: Not on file  . Transportation needs    Medical: Not on file    Non-medical: Not on file  Tobacco Use  . Smoking status: Former Research scientist (life sciences)  . Smokeless tobacco: Never Used  . Tobacco comment: quit 1975  Substance and Sexual Activity  . Alcohol use: Not Currently    Comment: stopped January 2020  . Drug use: No  . Sexual activity: Yes  Lifestyle  . Physical activity    Days per week: Not  on file    Minutes per session: Not on file  . Stress: Not on file  Relationships  . Social Herbalist on phone: Not on file    Gets together: Not on file    Attends religious service: Not on file    Active member of club or organization: Not on file    Attends meetings of clubs or organizations: Not on file    Relationship status: Not on file  Other Topics Concern  . Not on file  Social History Narrative  . Not on file      Activities of Daily Living In your present state of health, do you have any difficulty performing the following activities: 08/22/2019  Hearing? N  Vision? N  Difficulty concentrating or making decisions? N  Walking or climbing stairs? N  Dressing or bathing? N  Doing errands, shopping? N  Preparing Food and eating  ? N  Using the Toilet? N  In the past six months, have you accidently leaked urine? N  Do you have problems with loss of bowel control? N  Managing your Medications? N  Managing your Finances? N  Housekeeping or managing your Housekeeping? N  Some recent data might be hidden    Patient Care Team: [name], MD as PCP - General (Family Medicine) Stephannie Li, [place] (Ophthalmology) Daryll Brod, MD as Consulting Physician (Orthopedic Surgery) [place] (Dentistry) Juanita Craver, MD as Consulting Physician (Gastroenterology) Thornton Park, MD as Consulting Physician (Gastroenterology) Tish Men, MD as Consulting Physician (Hematology)   Assessment:   This is a routine wellness examination for Timothy Grant.  Exercise Activities and Dietary recommendations Current Exercise Habits: Home exercise routine, Time (Minutes): 30, Frequency (Times/Week): 3, Weekly Exercise (Minutes/Week): 90, Intensity: Mild  Goals    . Weight (lb) < 240 lb (108.9 kg)     Lose 15-20 lbs by increasing activity and decrease caloric intake.        Fall Risk Fall Risk  08/22/2019 03/22/2018 11/08/2017  Falls in the past year? 0 No No  Number falls in past yr: 0 - -  Injury with Fall? 0 - -  Follow up Education provided;Falls evaluation completed - -   Is the patient's home free of loose throw rugs in walkways, pet beds, electrical cords, etc?   yes      Grab bars in the bathroom? yes      Handrails on the stairs?   yes      Adequate lighting?   yes  Depression Screen PHQ 2/9 Scores 08/22/2019 03/22/2018 11/08/2017  PHQ - 2 Score 0 0 0    Cognitive Function-no cognitive concerns at this time  MMSE - Mini Mental State Exam 03/22/2018  Orientation to time 5  Orientation to Place 5  Registration 3  Attention/ Calculation 5  Recall 3  Language- name 2 objects 2  Language- repeat 1  Language- follow 3 step command 3  Language- read & follow direction 1  Write a sentence 1  Copy design 1  Total score  30     6CIT Screen 08/22/2019  What Year? 0 points  What month? 0 points  What time? 0 points  Count back from 20 0 points  Months in reverse 0 points  Repeat phrase 0 points  Total Score 0    Immunization History  Administered Date(s) Administered  . Fluad Quad(high Dose 65+) 08/14/2019  . Hepatitis B, ped/adol 08/06/2009  . Influenza Split 08/07/2011, 08/28/2013  . Influenza, High  Dose Seasonal PF 09/26/2017  . Influenza, Quadrivalent, Recombinant, Inj, Pf 09/06/2016  . Influenza, Seasonal, Injecte, Preservative Fre 09/04/2014, 09/09/2015  . Influenza,trivalent, recombinat, inj, PF 08/09/2012  . Influenza-Unspecified 09/22/2018  . Pneumococcal Conjugate-13 11/04/2014  . Pneumococcal Polysaccharide-23 02/05/2011  . Tdap 02/05/2011  . Zoster 02/05/2011    Qualifies for Shingles Vaccine? Completed at pharmacy; will request records   Screening Tests Health Maintenance  Topic Date Due  . COLONOSCOPY  12/07/2019 (Originally 02/23/2019)  . TETANUS/TDAP  02/04/2021  . INFLUENZA VACCINE  Completed  . Hepatitis C Screening  Completed  . PNA vac Low Risk Adult  Completed   Cancer Screenings: Lung: Low Dose CT Chest recommended if Age 70-80 years, 30 pack-year currently smoking OR have quit w/in 15years. Patient does not qualify. Colorectal: colonoscopy 02/22/14 at [facility]:    I have personally reviewed and addressed the Medicare Annual Wellness questionnaire and have noted the following in the patient's chart:  A. Medical and social history B. Use of alcohol, tobacco or illicit drugs  C. Current medications and supplements D. Functional ability and status E.  Nutritional status F.  Physical activity G. Advance directives H. List of other physicians I.  Hospitalizations, surgeries, and ER visits in previous 12 months J.  [place] such as hearing and vision if needed, cognitive and depression L. Referrals, records requested, and appointments- will  request immunization records    In addition, I have reviewed and discussed with patient certain preventive protocols, quality metrics, and best practice recommendations. A written personalized care plan for preventive services as well as general preventive health recommendations were provided to patient.   Signed,  Denman George, LPN  Nurse Health Advisor   Nurse Notes: no additional

## 2019-08-22 NOTE — Progress Notes (Signed)
I have reviewed the documentation from the recent AWV done by Courtney Slade, RN; I agree with the documentation and will follow up on any recommendations or abnormal findings as suggested.  

## 2019-08-27 ENCOUNTER — Other Ambulatory Visit (INDEPENDENT_AMBULATORY_CARE_PROVIDER_SITE_OTHER): Payer: Medicare Other

## 2019-08-27 DIAGNOSIS — B159 Hepatitis A without hepatic coma: Secondary | ICD-10-CM | POA: Diagnosis not present

## 2019-08-27 LAB — CBC WITH DIFFERENTIAL/PLATELET
Basophils Absolute: 0 10*3/uL (ref 0.0–0.1)
Basophils Relative: 0.5 % (ref 0.0–3.0)
Eosinophils Absolute: 0.1 10*3/uL (ref 0.0–0.7)
Eosinophils Relative: 3.1 % (ref 0.0–5.0)
HCT: 37 % — ABNORMAL LOW (ref 39.0–52.0)
Hemoglobin: 12.9 g/dL — ABNORMAL LOW (ref 13.0–17.0)
Lymphocytes Relative: 34.2 % (ref 12.0–46.0)
Lymphs Abs: 1.4 10*3/uL (ref 0.7–4.0)
MCHC: 34.8 g/dL (ref 30.0–36.0)
MCV: 101.1 fl — ABNORMAL HIGH (ref 78.0–100.0)
Monocytes Absolute: 0.5 10*3/uL (ref 0.1–1.0)
Monocytes Relative: 12.2 % — ABNORMAL HIGH (ref 3.0–12.0)
Neutro Abs: 2.1 10*3/uL (ref 1.4–7.7)
Neutrophils Relative %: 50 % (ref 43.0–77.0)
Platelets: 104 10*3/uL — ABNORMAL LOW (ref 150.0–400.0)
RBC: 3.66 Mil/uL — ABNORMAL LOW (ref 4.22–5.81)
RDW: 12.4 % (ref 11.5–15.5)
WBC: 4.1 10*3/uL (ref 4.0–10.5)

## 2019-08-27 LAB — COMPREHENSIVE METABOLIC PANEL
ALT: 23 U/L (ref 0–53)
AST: 33 U/L (ref 0–37)
Albumin: 3.6 g/dL (ref 3.5–5.2)
Alkaline Phosphatase: 117 U/L (ref 39–117)
BUN: 14 mg/dL (ref 6–23)
CO2: 23 mEq/L (ref 19–32)
Calcium: 10.1 mg/dL (ref 8.4–10.5)
Chloride: 104 mEq/L (ref 96–112)
Creatinine, Ser: 0.87 mg/dL (ref 0.40–1.50)
GFR: 85.76 mL/min (ref >60.00)
Glucose, Bld: 130 mg/dL — ABNORMAL HIGH (ref 70–99)
Potassium: 4.3 mEq/L (ref 3.5–5.1)
Sodium: 135 mEq/L (ref 135–145)
Total Bilirubin: 0.7 mg/dL (ref 0.2–1.2)
Total Protein: 7.3 g/dL (ref 6.0–8.3)

## 2019-08-27 LAB — PROTIME-INR
INR: 1.2 ratio — ABNORMAL HIGH (ref 0.8–1.0)
Prothrombin Time: 13.5 s — ABNORMAL HIGH (ref 9.6–13.1)

## 2019-09-24 ENCOUNTER — Other Ambulatory Visit (INDEPENDENT_AMBULATORY_CARE_PROVIDER_SITE_OTHER): Payer: Medicare Other

## 2019-09-24 ENCOUNTER — Other Ambulatory Visit: Payer: Self-pay | Admitting: *Deleted

## 2019-09-24 DIAGNOSIS — B159 Hepatitis A without hepatic coma: Secondary | ICD-10-CM

## 2019-09-24 LAB — COMPREHENSIVE METABOLIC PANEL
ALT: 27 U/L (ref 0–53)
AST: 39 U/L — ABNORMAL HIGH (ref 0–37)
Albumin: 3.8 g/dL (ref 3.5–5.2)
Alkaline Phosphatase: 120 U/L — ABNORMAL HIGH (ref 39–117)
BUN: 14 mg/dL (ref 6–23)
CO2: 26 mEq/L (ref 19–32)
Calcium: 9.9 mg/dL (ref 8.4–10.5)
Chloride: 102 mEq/L (ref 96–112)
Creatinine, Ser: 0.96 mg/dL (ref 0.40–1.50)
GFR: 76.53 mL/min (ref >60.00)
Glucose, Bld: 110 mg/dL — ABNORMAL HIGH (ref 70–99)
Potassium: 4.2 mEq/L (ref 3.5–5.1)
Sodium: 135 mEq/L (ref 135–145)
Total Bilirubin: 0.6 mg/dL (ref 0.2–1.2)
Total Protein: 7.4 g/dL (ref 6.0–8.3)

## 2019-09-24 LAB — CBC WITH DIFFERENTIAL/PLATELET
Basophils Absolute: 0 10*3/uL (ref 0.0–0.1)
Basophils Relative: 1.3 % (ref 0.0–3.0)
Eosinophils Absolute: 0.1 10*3/uL (ref 0.0–0.7)
Eosinophils Relative: 2.8 % (ref 0.0–5.0)
HCT: 37.7 % — ABNORMAL LOW (ref 39.0–52.0)
Hemoglobin: 13.3 g/dL (ref 13.0–17.0)
Lymphocytes Relative: 35.2 % (ref 12.0–46.0)
Lymphs Abs: 1.3 10*3/uL (ref 0.7–4.0)
MCHC: 35.3 g/dL (ref 30.0–36.0)
MCV: 100.9 fl — ABNORMAL HIGH (ref 78.0–100.0)
Monocytes Absolute: 0.5 10*3/uL (ref 0.1–1.0)
Monocytes Relative: 12.3 % — ABNORMAL HIGH (ref 3.0–12.0)
Neutro Abs: 1.8 10*3/uL (ref 1.4–7.7)
Neutrophils Relative %: 48.4 % (ref 43.0–77.0)
Platelets: 102 10*3/uL — ABNORMAL LOW (ref 150.0–400.0)
RBC: 3.73 Mil/uL — ABNORMAL LOW (ref 4.22–5.81)
RDW: 12.8 % (ref 11.5–15.5)
WBC: 3.7 10*3/uL — ABNORMAL LOW (ref 4.0–10.5)

## 2019-09-24 LAB — PROTIME-INR
INR: 1.1 ratio — ABNORMAL HIGH (ref 0.8–1.0)
Prothrombin Time: 13.2 s — ABNORMAL HIGH (ref 9.6–13.1)

## 2019-10-01 ENCOUNTER — Telehealth: Payer: Self-pay | Admitting: Hematology

## 2019-10-01 NOTE — Telephone Encounter (Signed)
Returned patient's phone call regarding cancelling 10/28 per 10/26 schedule message, per patient's request appointment cancelled. Left a voicemail to reschedule.

## 2019-10-03 ENCOUNTER — Other Ambulatory Visit: Payer: Medicare Other

## 2019-10-22 ENCOUNTER — Other Ambulatory Visit (INDEPENDENT_AMBULATORY_CARE_PROVIDER_SITE_OTHER): Payer: Medicare Other

## 2019-10-22 ENCOUNTER — Other Ambulatory Visit: Payer: Self-pay | Admitting: *Deleted

## 2019-10-22 ENCOUNTER — Encounter: Payer: Self-pay | Admitting: *Deleted

## 2019-10-22 DIAGNOSIS — B159 Hepatitis A without hepatic coma: Secondary | ICD-10-CM

## 2019-10-22 LAB — COMPREHENSIVE METABOLIC PANEL
ALT: 25 U/L (ref 0–53)
AST: 33 U/L (ref 0–37)
Albumin: 3.9 g/dL (ref 3.5–5.2)
Alkaline Phosphatase: 109 U/L (ref 39–117)
BUN: 15 mg/dL (ref 6–23)
CO2: 24 mEq/L (ref 19–32)
Calcium: 9.8 mg/dL (ref 8.4–10.5)
Chloride: 103 mEq/L (ref 96–112)
Creatinine, Ser: 0.92 mg/dL (ref 0.40–1.50)
GFR: 80.37 mL/min (ref >60.00)
Glucose, Bld: 111 mg/dL — ABNORMAL HIGH (ref 70–99)
Potassium: 4.5 mEq/L (ref 3.5–5.1)
Sodium: 134 mEq/L — ABNORMAL LOW (ref 135–145)
Total Bilirubin: 0.7 mg/dL (ref 0.2–1.2)
Total Protein: 7.6 g/dL (ref 6.0–8.3)

## 2019-10-22 LAB — CBC WITH DIFFERENTIAL/PLATELET
Basophils Absolute: 0 10*3/uL (ref 0.0–0.1)
Basophils Relative: 1 % (ref 0.0–3.0)
Eosinophils Absolute: 0.1 10*3/uL (ref 0.0–0.7)
Eosinophils Relative: 3 % (ref 0.0–5.0)
HCT: 37.9 % — ABNORMAL LOW (ref 39.0–52.0)
Hemoglobin: 13.4 g/dL (ref 13.0–17.0)
Lymphocytes Relative: 33.9 % (ref 12.0–46.0)
Lymphs Abs: 1.4 10*3/uL (ref 0.7–4.0)
MCHC: 35.5 g/dL (ref 30.0–36.0)
MCV: 100.1 fl — ABNORMAL HIGH (ref 78.0–100.0)
Monocytes Absolute: 0.5 10*3/uL (ref 0.1–1.0)
Monocytes Relative: 11.3 % (ref 3.0–12.0)
Neutro Abs: 2.1 10*3/uL (ref 1.4–7.7)
Neutrophils Relative %: 50.8 % (ref 43.0–77.0)
Platelets: 110 10*3/uL — ABNORMAL LOW (ref 150.0–400.0)
RBC: 3.79 Mil/uL — ABNORMAL LOW (ref 4.22–5.81)
RDW: 13.4 % (ref 11.5–15.5)
WBC: 4.1 10*3/uL (ref 4.0–10.5)

## 2019-10-22 LAB — PROTIME-INR
INR: 1.2 ratio — ABNORMAL HIGH (ref 0.8–1.0)
Prothrombin Time: 13.5 s — ABNORMAL HIGH (ref 9.6–13.1)

## 2019-10-22 NOTE — Progress Notes (Signed)
Labs reviewed. Stable labs. No med changes.

## 2019-10-25 ENCOUNTER — Encounter: Payer: Self-pay | Admitting: Family Medicine

## 2019-11-14 ENCOUNTER — Encounter: Payer: Self-pay | Admitting: Family Medicine

## 2019-11-14 ENCOUNTER — Ambulatory Visit (INDEPENDENT_AMBULATORY_CARE_PROVIDER_SITE_OTHER): Payer: Medicare Other | Admitting: Family Medicine

## 2019-11-14 ENCOUNTER — Other Ambulatory Visit: Payer: Self-pay

## 2019-11-14 VITALS — BP 118/70 | HR 65 | Temp 98.0°F | Ht 73.0 in | Wt 226.0 lb

## 2019-11-14 DIAGNOSIS — E871 Hypo-osmolality and hyponatremia: Secondary | ICD-10-CM | POA: Diagnosis not present

## 2019-11-14 DIAGNOSIS — B159 Hepatitis A without hepatic coma: Secondary | ICD-10-CM

## 2019-11-14 DIAGNOSIS — D696 Thrombocytopenia, unspecified: Secondary | ICD-10-CM

## 2019-11-14 DIAGNOSIS — E039 Hypothyroidism, unspecified: Secondary | ICD-10-CM

## 2019-11-14 DIAGNOSIS — E782 Mixed hyperlipidemia: Secondary | ICD-10-CM

## 2019-11-14 LAB — LIPID PANEL
Cholesterol: 249 mg/dL — ABNORMAL HIGH (ref 0–200)
HDL: 65.1 mg/dL (ref >39.00)
LDL Cholesterol: 161 mg/dL — ABNORMAL HIGH (ref 0–99)
NonHDL: 184.23
Total CHOL/HDL Ratio: 4
Triglycerides: 115 mg/dL (ref 0.0–149.0)
VLDL: 23 mg/dL (ref 0.0–40.0)

## 2019-11-14 LAB — COMPREHENSIVE METABOLIC PANEL
ALT: 38 U/L (ref 0–53)
AST: 44 U/L — ABNORMAL HIGH (ref 0–37)
Albumin: 4 g/dL (ref 3.5–5.2)
Alkaline Phosphatase: 124 U/L — ABNORMAL HIGH (ref 39–117)
BUN: 17 mg/dL (ref 6–23)
CO2: 25 mEq/L (ref 19–32)
Calcium: 9.8 mg/dL (ref 8.4–10.5)
Chloride: 103 mEq/L (ref 96–112)
Creatinine, Ser: 1.06 mg/dL (ref 0.40–1.50)
GFR: 68.24 mL/min (ref >60.00)
Glucose, Bld: 106 mg/dL — ABNORMAL HIGH (ref 70–99)
Potassium: 4.6 mEq/L (ref 3.5–5.1)
Sodium: 134 mEq/L — ABNORMAL LOW (ref 135–145)
Total Bilirubin: 0.6 mg/dL (ref 0.2–1.2)
Total Protein: 7.6 g/dL (ref 6.0–8.3)

## 2019-11-14 LAB — TSH: TSH: 6.26 u[IU]/mL — ABNORMAL HIGH (ref 0.35–4.50)

## 2019-11-14 NOTE — Progress Notes (Signed)
Subjective  CC:  Chief Complaint  Patient presents with  . Hepatitis A  . Hyperlipidemia    HPI: Timothy Grant is a 74 y.o. male who presents to the office today to address the problems listed above in the chief complaint.  . Hypothyroidism f/u: Timothy Grant is a 73 y.o. male who presents for follow up of hypothyroidism. Last TSH showed control was unsatisfactory and we decreased his thyroid dose to 179mcg daily, and thyroid supplement medication was adjusted accordingly.  Current symptoms: feeling better and has been able to gain weight . Patient denies change in energy level, diarrhea, heat / cold intolerance, nervousness and palpitations. Symptoms have stabilized.He has been compliant with the medication. Due for lab recheck.  Lab Results  Component Value Date   TSH 0.29 (L) 08/14/2019      Hepatitis A: labs have normalized. Has f/u with hepatologist to f/u on liver and ? Mild fibrosis. No edema or jaundice or pain. No nausea. Energy levels are good again. Lab Results  Component Value Date   ALT 25 10/22/2019   AST 33 10/22/2019   ALKPHOS 109 10/22/2019   BILITOT 0.7 10/22/2019    Hyponatremia: stablilizing. On one salt tablet daily and fairly normal fluid intake.  Lab Results  Component Value Date   CREATININE 0.92 10/22/2019   BUN 15 10/22/2019   NA 134 (L) 10/22/2019   K 4.5 10/22/2019   CL 103 10/22/2019   CO2 24 10/22/2019    Thrombocytopenia: chronic and reactive in nature. Stable levels. Heme has evaluated. Pancytopenia has resolved.  Lab Results  Component Value Date   WBC 4.1 10/22/2019   HGB 13.4 10/22/2019   HCT 37.9 (L) 10/22/2019   MCV 100.1 (H) 10/22/2019   PLT 110.0 (L) 10/22/2019    HLD: had been on statin but stopped due to rhabdo in setting of acute hepatitis and dehydration. He worries about being able to take it again and the effects of not treating his cholesterol now.  Lab Results  Component Value Date   CHOL 172 05/09/2019    CHOL 154 03/22/2018   CHOL 171 11/08/2017   Lab Results  Component Value Date   HDL 38.10 (L) 05/09/2019   HDL 50.10 03/22/2018   HDL 44.10 11/08/2017   Lab Results  Component Value Date   LDLCALC 114 (H) 05/09/2019   LDLCALC 88 03/22/2018   LDLCALC 102 (H) 11/08/2017   Lab Results  Component Value Date   TRIG 101.0 05/09/2019   TRIG 76.0 03/22/2018   TRIG 124.0 11/08/2017   Lab Results  Component Value Date   CHOLHDL 5 05/09/2019   CHOLHDL 3 03/22/2018   CHOLHDL 4 11/08/2017    Wt Readings from Last 3 Encounters:  11/14/19 226 lb (102.5 kg)  08/14/19 215 lb 3.2 oz (97.6 kg)  07/10/19 211 lb 6.4 oz (95.9 kg)    Assessment  1. Acquired hypothyroidism   2. Viral hepatitis A without hepatic coma   3. Hyponatremia   4. Mixed hyperlipidemia   5. Thrombocytopenia ([place])      Plan   Hypothyroidism:  recheck labs today and adjust meds accordingly. Pt is clinicall euthyroid.  Hep A: resolving. Await hepatolgist report.   HLD: favor restarting statin but waiting until fully cleared by GI; will need to monitor liver function and CK closely while restarting.   Low sodium: almost normal. Recheck today and liberalize fluid completely if stable.   Low plts: reassured. Stable.   Follow up: Return in  about 6 months (around 05/14/2020) for complete physical.  Visit date not found  Orders Placed This Encounter  Procedures  . TSH  . Lipids  . CMP   No orders of the defined types were placed in this encounter.     I reviewed the patients updated PMH, FH, and SocHx.    Patient Active Problem List   Diagnosis Date Noted  . Acquired hypothyroidism 02/05/2011    Priority: High  . Adenomatous colon polyp 02/05/2011    Priority: High  . Mixed hyperlipidemia 02/05/2011    Priority: High  . Osteoarthritis of finger of left hand 01/07/2017    Priority: Medium  . Leukopenia 08/09/2012    Priority: Medium  . Trigger finger, left ring finger 01/07/2017    Priority: Low   . Male erectile disorder 02/09/2012    Priority: Low  . Macrocytic anemia 07/09/2019  . Thrombocytopenia ([place]) 07/09/2019  . Pancytopenia ([place]) 03/27/2019  . Non-traumatic rhabdomyolysis 02/21/2019   Current Meds  Medication Sig  . acetaminophen (TYLENOL) 325 MG tablet Take 650 mg by mouth as needed.  . Cholecalciferol (VITAMIN D3) 5000 units TABS Take 1 tablet by mouth daily.   . cyclobenzaprine (FLEXERIL) 10 MG tablet Take 1 tablet (10 mg total) by mouth as needed.  . folic acid (FOLVITE) 1 MG tablet Take 1 tablet (1 mg total) by mouth daily.  . Glucosamine HCl 1000 MG TABS Take 2 tablets by mouth daily.  Marland Kitchen levothyroxine (SYNTHROID) 125 MCG tablet Take 1 tablet (125 mcg total) by mouth daily before breakfast.  . Multiple Vitamin (MULTIVITAMIN) capsule Take 1 capsule by mouth daily.   . Omega-3 1000 MG CAPS Take 1 tablet by mouth daily.  . sildenafil (REVATIO) 20 MG tablet Use 5 tablets PO as needed  . sodium chloride 1 g tablet Take 1 g by mouth daily.     Allergies: Patient has No Known Allergies. Family History: Patient family history includes Alzheimer's disease in his mother; Arthritis in his mother; Cancer in his brother and sister; Dementia in his mother; Heart disease in his father and paternal grandmother; Hyperlipidemia in his brother; Stroke in his brother and mother. Social History:  Patient  reports that he has quit smoking. He has never used smokeless tobacco. He reports previous alcohol use. He reports that he does not use drugs.  Review of Systems: Constitutional: Negative for fever malaise or anorexia Cardiovascular: negative for chest pain Respiratory: negative for SOB or persistent cough Gastrointestinal: negative for abdominal pain  Objective  Vitals: BP 118/70 (BP Location: Left Arm, Patient Position: Sitting, Cuff Size: Normal)   Pulse 65   Temp 98 F (36.7 C) (Temporal)   Ht 6\' 1"  (1.854 m)   Wt 226 lb (102.5 kg)   SpO2 96%   BMI 29.82 kg/m   General: no acute distress , A&Ox3, appears well HEENT: PEERL, no proptosis or lid lag, conjunctiva normal, no thyromegaly or thyroid nodules Cardiovascular:  RRR without murmur or gallop. No peripheral edema Respiratory:  Good breath sounds bilaterally, CTAB with normal respiratory effort Skin:  Warm, no rashes Neuro: no tremor     Commons side effects, risks, benefits, and alternatives for medications and treatment plan prescribed today were discussed, and the patient expressed understanding of the given instructions. Patient is instructed to call or message via MyChart if he/she has any questions or concerns regarding our treatment plan. No barriers to understanding were identified. We discussed Red Flag symptoms and signs in detail. Patient expressed understanding  regarding what to do in case of urgent or emergency type symptoms.   Medication list was reconciled, printed and provided to the patient in AVS. Patient instructions and summary information was reviewed with the patient as documented in the AVS. This note was prepared with assistance of Dragon voice recognition software. Occasional wrong-word or sound-a-like substitutions may have occurred due to the inherent limitations of voice recognition software

## 2019-11-14 NOTE — Patient Instructions (Signed)
Please return in June 2021 for your annual complete physical; please come fasting.  I will release your lab results to you on your MyChart account with further instructions. Please reply with any questions.    If you have any questions or concerns, please don't hesitate to send me a message via MyChart or call the office at [phone]. Thank you for visiting with [place] today! It's our pleasure caring for you.  Merry Christmas!

## 2019-11-15 ENCOUNTER — Other Ambulatory Visit: Payer: Self-pay | Admitting: *Deleted

## 2019-11-15 DIAGNOSIS — B159 Hepatitis A without hepatic coma: Secondary | ICD-10-CM

## 2019-12-17 ENCOUNTER — Other Ambulatory Visit: Payer: Self-pay | Admitting: *Deleted

## 2019-12-17 ENCOUNTER — Other Ambulatory Visit (INDEPENDENT_AMBULATORY_CARE_PROVIDER_SITE_OTHER): Payer: Medicare Other

## 2019-12-17 DIAGNOSIS — B159 Hepatitis A without hepatic coma: Secondary | ICD-10-CM

## 2019-12-17 LAB — PROTIME-INR
INR: 1.2 ratio — ABNORMAL HIGH (ref 0.8–1.0)
Prothrombin Time: 13.6 s — ABNORMAL HIGH (ref 9.6–13.1)

## 2019-12-17 LAB — CBC WITH DIFFERENTIAL/PLATELET
Basophils Absolute: 0 10*3/uL (ref 0.0–0.1)
Basophils Relative: 0.6 % (ref 0.0–3.0)
Eosinophils Absolute: 0.1 10*3/uL (ref 0.0–0.7)
Eosinophils Relative: 2.8 % (ref 0.0–5.0)
HCT: 39.4 % (ref 39.0–52.0)
Hemoglobin: 13.7 g/dL (ref 13.0–17.0)
Lymphocytes Relative: 34.7 % (ref 12.0–46.0)
Lymphs Abs: 1.4 10*3/uL (ref 0.7–4.0)
MCHC: 34.7 g/dL (ref 30.0–36.0)
MCV: 100.9 fl — ABNORMAL HIGH (ref 78.0–100.0)
Monocytes Absolute: 0.4 10*3/uL (ref 0.1–1.0)
Monocytes Relative: 10.3 % (ref 3.0–12.0)
Neutro Abs: 2.1 10*3/uL (ref 1.4–7.7)
Neutrophils Relative %: 51.6 % (ref 43.0–77.0)
Platelets: 105 10*3/uL — ABNORMAL LOW (ref 150.0–400.0)
RBC: 3.91 Mil/uL — ABNORMAL LOW (ref 4.22–5.81)
RDW: 13.2 % (ref 11.5–15.5)
WBC: 4.1 10*3/uL (ref 4.0–10.5)

## 2019-12-17 LAB — COMPREHENSIVE METABOLIC PANEL
ALT: 26 U/L (ref 0–53)
AST: 29 U/L (ref 0–37)
Albumin: 4.2 g/dL (ref 3.5–5.2)
Alkaline Phosphatase: 92 U/L (ref 39–117)
BUN: 15 mg/dL (ref 6–23)
CO2: 26 mEq/L (ref 19–32)
Calcium: 10.1 mg/dL (ref 8.4–10.5)
Chloride: 103 mEq/L (ref 96–112)
Creatinine, Ser: 1.01 mg/dL (ref 0.40–1.50)
GFR: 72.13 mL/min (ref >60.00)
Glucose, Bld: 108 mg/dL — ABNORMAL HIGH (ref 70–99)
Potassium: 4.7 mEq/L (ref 3.5–5.1)
Sodium: 136 mEq/L (ref 135–145)
Total Bilirubin: 0.8 mg/dL (ref 0.2–1.2)
Total Protein: 7.8 g/dL (ref 6.0–8.3)

## 2019-12-17 LAB — TSH: TSH: 6.36 u[IU]/mL — ABNORMAL HIGH (ref 0.35–4.50)

## 2019-12-19 ENCOUNTER — Encounter: Payer: Self-pay | Admitting: Family Medicine

## 2019-12-19 ENCOUNTER — Other Ambulatory Visit: Payer: Self-pay | Admitting: Family Medicine

## 2019-12-19 DIAGNOSIS — E039 Hypothyroidism, unspecified: Secondary | ICD-10-CM

## 2019-12-19 MED ORDER — LEVOTHYROXINE SODIUM 137 MCG PO TABS: 137.0000 ug | ORAL_TABLET | Freq: Every day | ORAL | 3 refills | Status: DC

## 2019-12-19 NOTE — Progress Notes (Signed)
Called and LVM with pt to schedule appt

## 2020-01-06 ENCOUNTER — Ambulatory Visit: Payer: Medicare Other

## 2020-01-08 IMAGING — MR MR 3D RECON AT [facility]
20 of 24 series · 20 of 24 positions shown · IV contrast (gadavist)
Comparison: Ultrasound from 12/29/2018

CLINICAL DATA: Elevated liver enzymes. Jaundice. Abnormal
gallbladder revealed mild gallbladder wall thickening

EXAM:
MRI ABDOMEN WITHOUT AND WITH CONTRAST (INCLUDING MRCP)
TECHNIQUE: Multiplanar multisequence MR imaging of the abdomen was performed
both before and after the administration of intravenous contrast.
Heavily T2-weighted images of the biliary and pancreatic ducts were
obtained, and three-dimensional MRCP images were rendered by post
processing.
CONTRAST:  10 cc Gadavist

[Series 3: T2 fat-sat · axial · 5.0mm · 0.86mm/px · 1 of 54 slices shown]
[im 1/54]
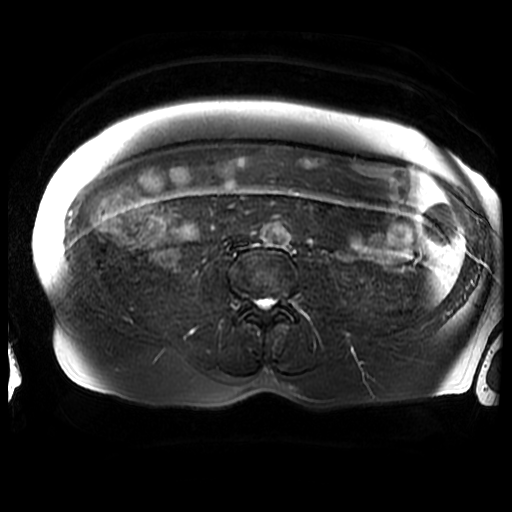

[Series 4: MRCP · coronal · 1.8mm · 0.62mm/px · 1 of 152 slices shown (1 of 2)]
[im 1/152]
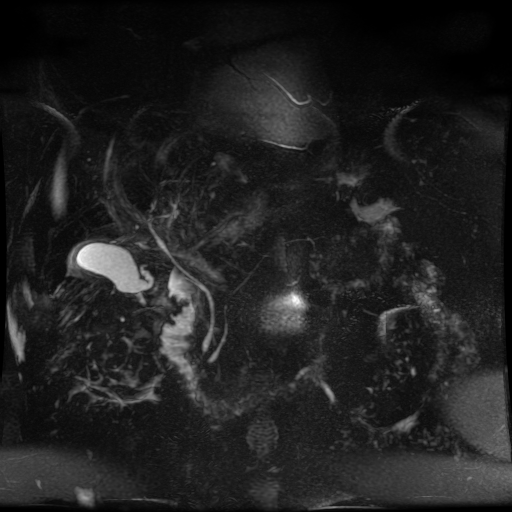

[Series 6: DWI b500 · axial · 6.0mm · 1.76mm/px · 1 of 78 slices shown]
[im 1/78]
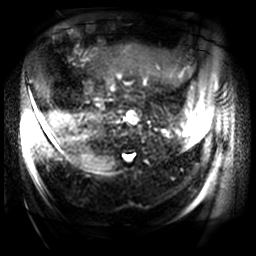

[Series 7: bSSFP fat-sat · coronal · 5.0mm · 0.78mm/px · 1 of 55 slices shown]
[im 1/55]
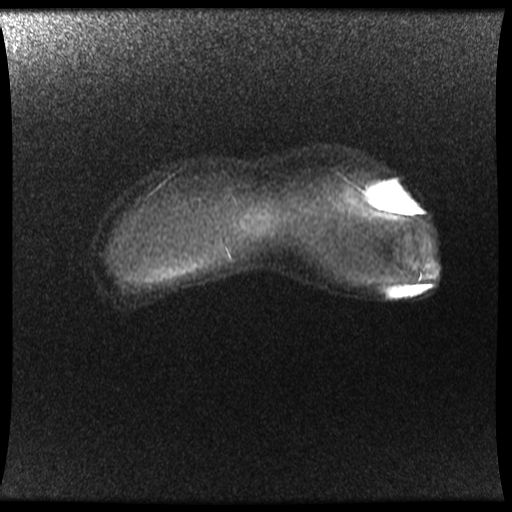

[Series 8: T2 · axial · 5.0mm · 0.86mm/px · 1 of 54 slices shown]
[im 1/54]
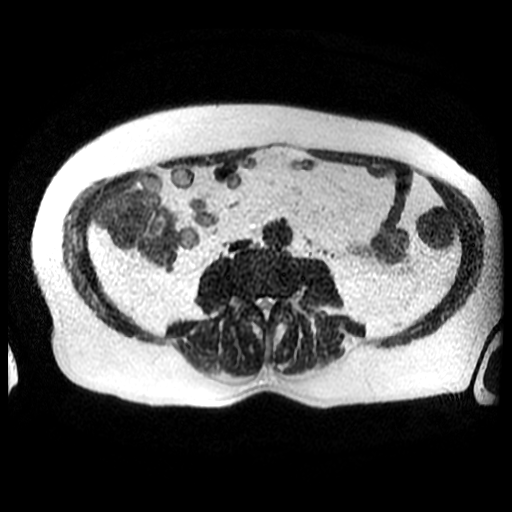

[Series 9: ax dualecho bh · axial · 5.0mm · 0.86mm/px · 1 of 54 slices shown]
[im 1/54]
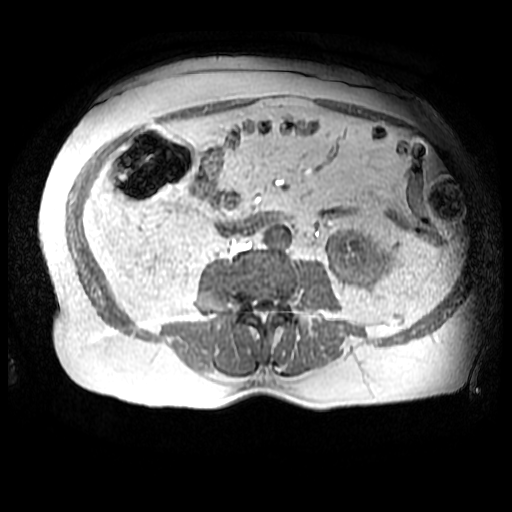

[Series 10: MRCP · coronal · 40.0mm · 0.70mm/px · 1 of 9 slices shown (2 of 2)]
[im 1/9]
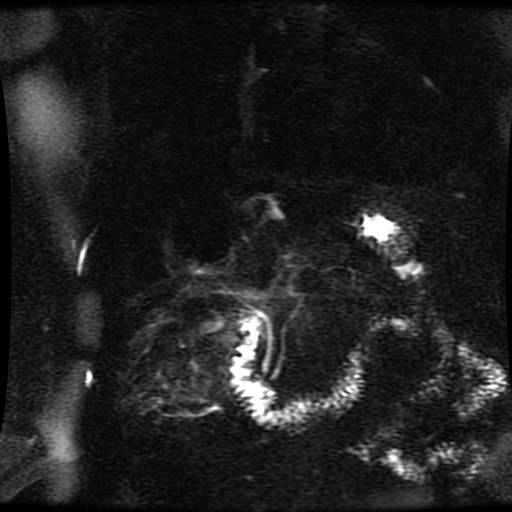

[Series 15: T1 dynamic · coronal · delayed · 10.0mm · 0.84mm/px · 1 of 80 slices shown (1 of 6)]
[im 1/80]
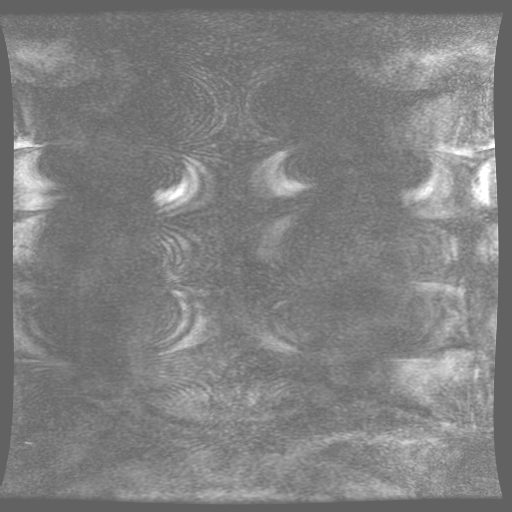

[Series 400: reformatted · axial · 1.8mm · 0.62mm/px · 1 of 116 slices shown (1 of 2)]
[im 1/116]
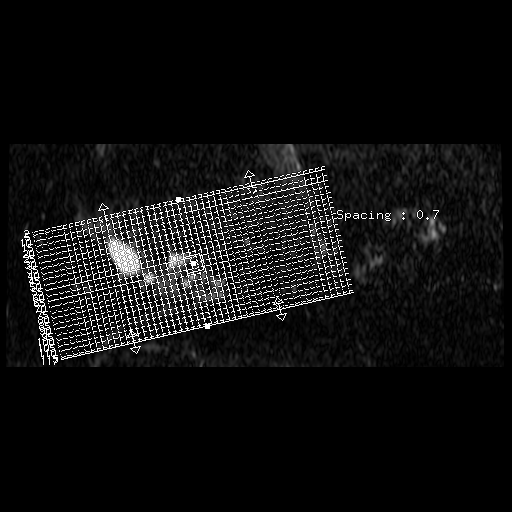

[Series 401: reformatted · axial · 1.8mm · 0.62mm/px · 1 of 181 slices shown (2 of 2)]
[im 1/181]
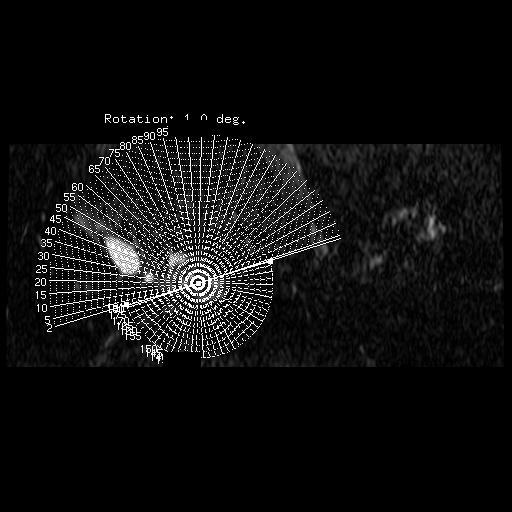

[Series 600: DWI · axial · 6.0mm · 1.76mm/px · 1 of 39 slices shown]
[im 1/39]
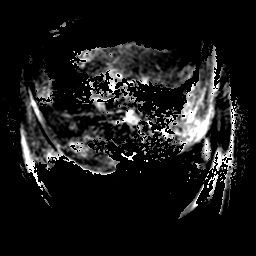

[Series 1400: T1 dynamic · axial · 6.2mm · 0.90mm/px · 1 of 88 slices shown (2 of 6)]
[im 1/88]
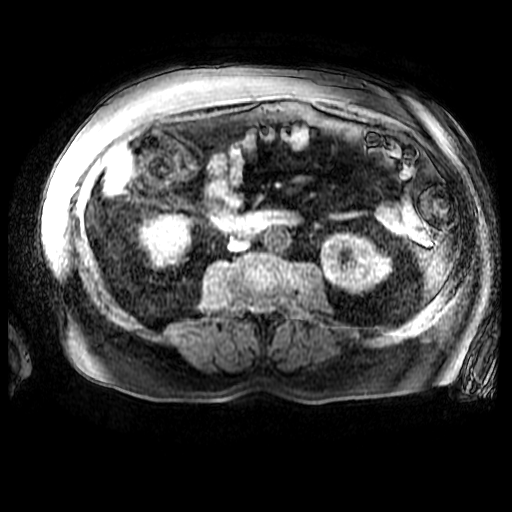

[Series 1402: T1 dynamic · axial · 6.2mm · 0.90mm/px · 1 of 88 slices shown (3 of 6)]
[im 1/88]
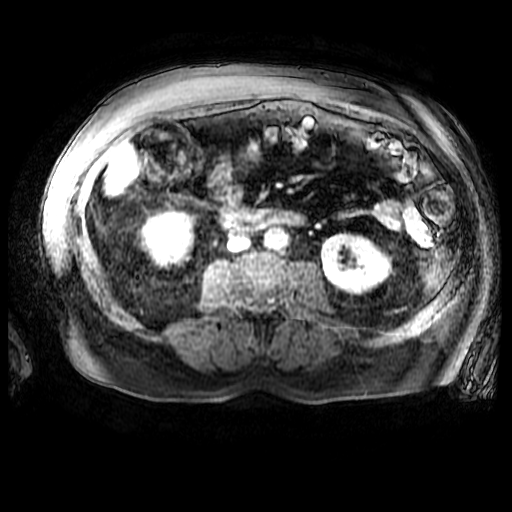

[Series 1403: T1 dynamic · axial · 6.2mm · 0.90mm/px · 1 of 88 slices shown (4 of 6)]
[im 1/88]
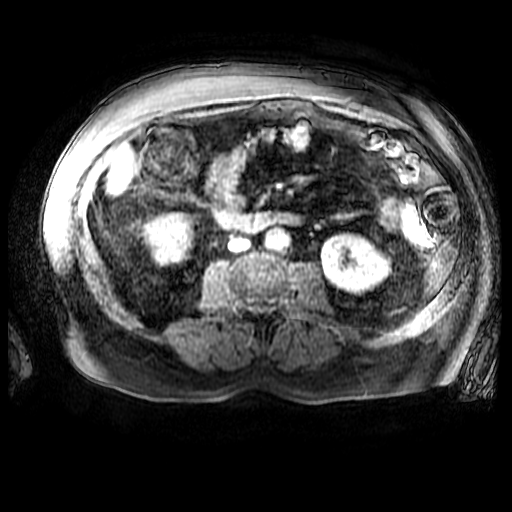

[Series 1404: T1 dynamic · axial · 6.2mm · 0.90mm/px · 1 of 88 slices shown (5 of 6)]
[im 1/88]
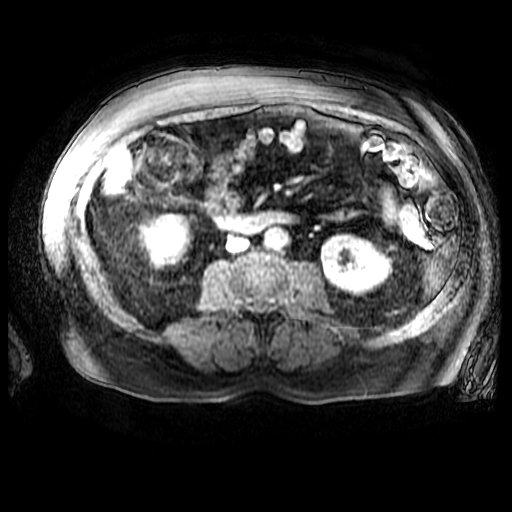

[Series 1405: T1 dynamic · axial · 6.2mm · 0.90mm/px · 1 of 88 slices shown (6 of 6)]
[im 1/88]
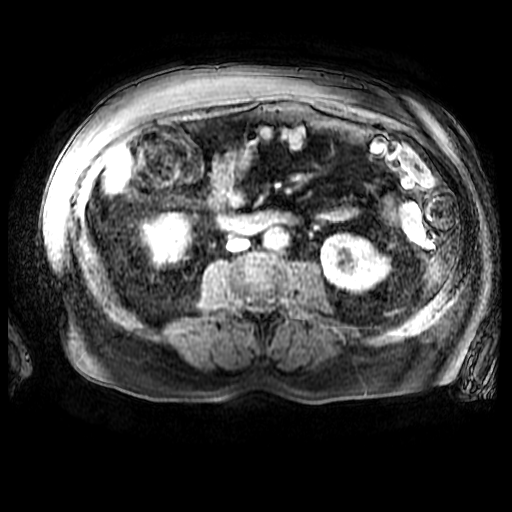

[((id)/(id)/1)-((id)/(id)/1) · axial · 6.2mm · 0.90mm/px · 1 of 88 slices shown (1 of 4)]
[im 1/88]
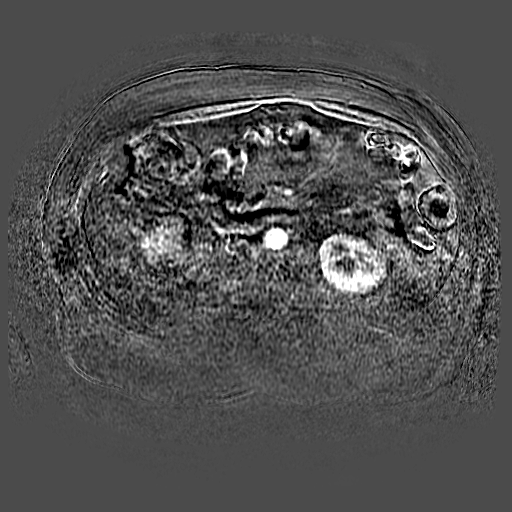

[((id)/(id)/1)-((id)/(id)/1) · axial · 6.2mm · 0.90mm/px · 1 of 88 slices shown (2 of 4)]
[im 1/88]
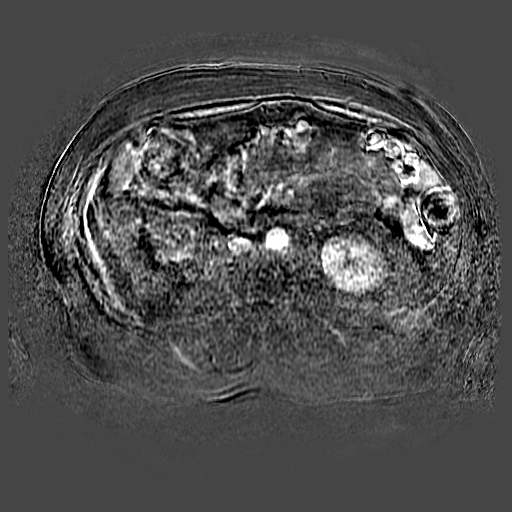

[((id)/(id)/1)-((id)/(id)/1) · axial · 6.2mm · 0.90mm/px · 1 of 88 slices shown (3 of 4)]
[im 1/88]
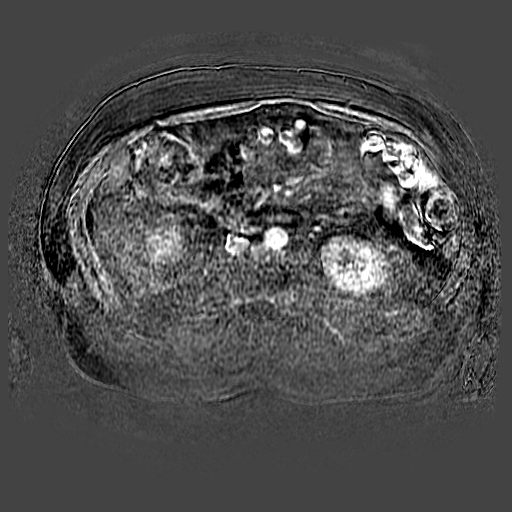

[((id)/(id)/1)-((id)/(id)/1) · axial · 6.2mm · 0.90mm/px · 1 of 88 slices shown (4 of 4)]
[im 1/88]
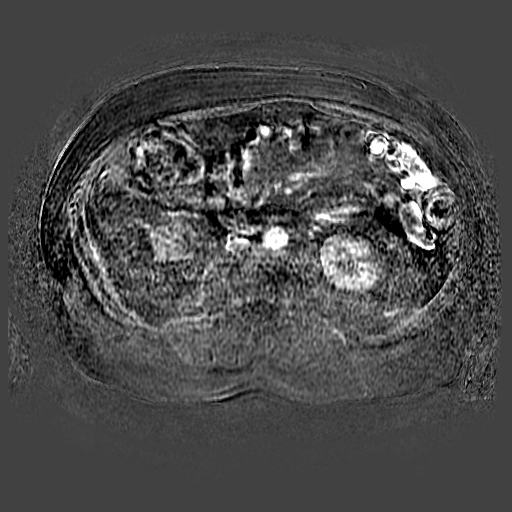

[20 of 24 positions shown; findings below may reference images not displayed]

FINDINGS: Despite efforts by the technologist and patient, motion artifact is
present on today's exam and could not be eliminated. This reduces
exam sensitivity and specificity.

Lower chest: Unremarkable

Hepatobiliary: No significant hepatic steatosis. Mild-to-moderate
gallbladder wall thickening. No appreciable biliary dilatation or
biliary irregularity. No filling defects are observed in the
gallbladder or biliary tree.

Periportal edema is noted with subtle delayed periportal
enhancement. No portal vein thrombosis. No morphologic indicators of
cirrhosis.

Pancreas:  Unremarkable

Spleen:  Unremarkable

Adrenals/Urinary Tract:  Unremarkable

Stomach/Bowel: There is edema surrounding the descending duodenum.

Vascular/Lymphatic:  Unremarkable

Other:  Trace perihepatic and perisplenic ascites.

Musculoskeletal: Unremarkable
IMPRESSION: 1. Periportal edema with some subtle delayed periportal enhancement.
Although not entirely specific, this pattern is often encountered in
the setting of acute hepatitis.
2. Mild-to-moderate gallbladder wall thickening, which may be
secondary to the patient's hypoalbuminemia although inflammation is
not readily excluded.
3. Trace perihepatic ascites and perisplenic ascites. Small amount
of fluid around the descending duodenum is probably incidental and
less likely from duodenitis.
4. The biliary tree appears otherwise unremarkable.

## 2020-01-10 ENCOUNTER — Inpatient Hospital Stay: Payer: Medicare Other

## 2020-01-10 ENCOUNTER — Inpatient Hospital Stay: Payer: Medicare Other | Admitting: Hematology

## 2020-01-12 ENCOUNTER — Ambulatory Visit: Payer: Medicare Other

## 2020-01-15 ENCOUNTER — Encounter: Payer: Self-pay | Admitting: Hematology

## 2020-01-15 ENCOUNTER — Inpatient Hospital Stay (HOSPITAL_BASED_OUTPATIENT_CLINIC_OR_DEPARTMENT_OTHER): Payer: Medicare Other | Admitting: Hematology

## 2020-01-15 ENCOUNTER — Other Ambulatory Visit: Payer: Self-pay

## 2020-01-15 ENCOUNTER — Inpatient Hospital Stay: Payer: Medicare Other | Attending: Hematology

## 2020-01-15 VITALS — BP 119/75 | HR 60 | Temp 96.6°F | Resp 18 | Ht 73.0 in | Wt 229.4 lb

## 2020-01-15 DIAGNOSIS — D539 Nutritional anemia, unspecified: Secondary | ICD-10-CM

## 2020-01-15 DIAGNOSIS — D72819 Decreased white blood cell count, unspecified: Secondary | ICD-10-CM | POA: Diagnosis not present

## 2020-01-15 DIAGNOSIS — D696 Thrombocytopenia, unspecified: Secondary | ICD-10-CM

## 2020-01-15 DIAGNOSIS — D61818 Other pancytopenia: Secondary | ICD-10-CM | POA: Diagnosis not present

## 2020-01-15 DIAGNOSIS — K7460 Unspecified cirrhosis of liver: Secondary | ICD-10-CM | POA: Diagnosis not present

## 2020-01-15 LAB — CBC WITH DIFFERENTIAL (CANCER CENTER ONLY)
Abs Immature Granulocytes: 0.02 10*3/uL (ref 0.00–0.07)
Basophils Absolute: 0 10*3/uL (ref 0.0–0.1)
Basophils Relative: 1 %
Eosinophils Absolute: 0.1 10*3/uL (ref 0.0–0.5)
Eosinophils Relative: 3 %
HCT: 38.6 % — ABNORMAL LOW (ref 39.0–52.0)
Hemoglobin: 13.5 g/dL (ref 13.0–17.0)
Immature Granulocytes: 1 %
Lymphocytes Relative: 36 %
Lymphs Abs: 1.2 10*3/uL (ref 0.7–4.0)
MCH: 34.9 pg — ABNORMAL HIGH (ref 26.0–34.0)
MCHC: 35 g/dL (ref 30.0–36.0)
MCV: 99.7 fL (ref 80.0–100.0)
Monocytes Absolute: 0.4 10*3/uL (ref 0.1–1.0)
Monocytes Relative: 11 %
Neutro Abs: 1.7 10*3/uL (ref 1.7–7.7)
Neutrophils Relative %: 48 %
Platelet Count: 100 10*3/uL — ABNORMAL LOW (ref 150–400)
RBC: 3.87 MIL/uL — ABNORMAL LOW (ref 4.22–5.81)
RDW: 12.7 % (ref 11.5–15.5)
WBC Count: 3.4 10*3/uL — ABNORMAL LOW (ref 4.0–10.5)
nRBC: 0 % (ref 0.0–0.2)

## 2020-01-15 LAB — CMP (CANCER CENTER ONLY)
ALT: 42 U/L (ref 0–44)
AST: 41 U/L (ref 15–41)
Albumin: 4.2 g/dL (ref 3.5–5.0)
Alkaline Phosphatase: 98 U/L (ref 38–126)
Anion gap: 6 (ref 5–15)
BUN: 16 mg/dL (ref 8–23)
CO2: 28 mmol/L (ref 22–32)
Calcium: 10 mg/dL (ref 8.9–10.3)
Chloride: 101 mmol/L (ref 98–111)
Creatinine: 1.03 mg/dL (ref 0.61–1.24)
GFR, Est AFR Am: >60 mL/min (ref >60)
GFR, Estimated: >60 mL/min (ref >60)
Glucose, Bld: 109 mg/dL — ABNORMAL HIGH (ref 70–99)
Potassium: 4.8 mmol/L (ref 3.5–5.1)
Sodium: 135 mmol/L (ref 135–145)
Total Bilirubin: 0.8 mg/dL (ref 0.3–1.2)
Total Protein: 7.4 g/dL (ref 6.5–8.1)

## 2020-01-15 LAB — LACTATE DEHYDROGENASE: LDH: 175 U/L (ref 98–192)

## 2020-01-15 LAB — SAVE SMEAR(SSMR), FOR PROVIDER SLIDE REVIEW

## 2020-01-15 NOTE — Progress Notes (Signed)
[facility] OFFICE PROGRESS NOTE  Patient Care Team: [name], MD as PCP - General (Family Medicine) Stephannie Li, [place] (Ophthalmology) Daryll Brod, MD as Consulting Physician (Orthopedic Surgery) [place] (Dentistry) Juanita Craver, MD as Consulting Physician (Gastroenterology) Thornton Park, MD as Consulting Physician (Gastroenterology) Tish Men, MD as Consulting Physician (Hematology)  HEME/ONC OVERVIEW: 1. Pancytopenia  (Previous patient of Dr. Walden Field) -Likely due to cirrhosis   WBC between 3 and 4k w/ nl diff, plts bewteen 140-160k's since 2009; Hgb relatively normal -Observation   PERTINENT NON-HEM/ONC PROBLEMS: 1. Severe acute Hepatitis A infection -01/2019: Tbili 17, AST > 1000, ALT 820; liver bx at [facility] showed severe acute hepatitis with focal areas of necrosis, mild early portal/peri-portal fibrosis  -02/2019: abdominal US showed liver cirrhosis w/ splenomegaly, large volume ascites   ASSESSMENT & PLAN:   Thrombocytopenia -Most likely secondary to cirrhosis -Plts 100k today, stable -Patient denies any symptoms of bleeding -Given the stable thrombocytopenia without any evidence of bleeding or excess bruising, there is no indication for bone marrow biopsy at this time  -I counseled the patient on some of the concerning symptoms, including hemoptysis, hematemesis, hematochezia, melena or excess bruising, for which he is instructed to contact the clinic for further evaluation -As he is having q21monthlab monitoring by his PCP, we can see him back in 1 year, as long as his thrombocytopenia remains stable  -If he develops worsening thrombocytopenia, then we can consider further work-up as needed   Leukopenia -Most likely secondary to cirrhosis -WBC between mid-3k's to 4k's -Patient denies any symptoms of infection -If he develops worsening leukopenia, we can consider bone marrow biopsy to further assess the etiology   Orders Placed This Encounter   Procedures  . CBC with Differential (Cancer Center Only)    Standing Status:   Future    Standing Expiration Date:   02/18/2021  . CMP ([facility])    Standing Status:   Future    Standing Expiration Date:   02/18/2021   The total time spent in the encounter was 32 minutes, including face-to-face time with the patient, review of various tests results, order additional studies/medications, documentation, and coordination of care plan.   All questions were answered. The patient knows to call the clinic with any problems, questions or concerns. No barriers to learning was detected.  Q360monthab monitoring with PCP. Return in 1 year if cytopenias remain stable.   YaTish MenMD 2/9/202111:46 AM  CHIEF COMPLAINT: "I am doing great"  INTERVAL HISTORY: Mr. BlPinsoneturns to clinic for follow-up of chronic thrombocytopenia and leukopenia.  Patient reports that he has been doing well since the last visit, and denies any constitutional symptoms, such as fever, chill, weight loss, night sweats, lymphadenopathy, or abnormal bleeding/bruising.  He denies any other complaint today.   REVIEW OF SYSTEMS:   Constitutional: ( - ) fevers, ( - )  chills , ( - ) night sweats Eyes: ( - ) blurriness of vision, ( - ) double vision, ( - ) watery eyes Ears, nose, mouth, throat, and face: ( - ) mucositis, ( - ) sore throat Respiratory: ( - ) cough, ( - ) dyspnea, ( - ) wheezes Cardiovascular: ( - ) palpitation, ( - ) chest discomfort, ( - ) lower extremity swelling Gastrointestinal:  ( - ) nausea, ( - ) heartburn, ( - ) change in bowel habits Skin: ( - ) abnormal skin rashes Lymphatics: ( - ) new lymphadenopathy, ( - )  easy bruising Neurological: ( - ) numbness, ( - ) tingling, ( - ) new weaknesses Behavioral/Psych: ( - ) mood change, ( - ) new changes  All other systems were reviewed with the patient and are negative.  SUMMARY OF ONCOLOGIC HISTORY: Oncology History   No history exists.    I  have reviewed the past medical history, past surgical history, social history and family history with the patient and they are unchanged from previous note.  ALLERGIES:  has No Known Allergies.  MEDICATIONS:  Current Outpatient Medications  Medication Sig Dispense Refill  . acetaminophen (TYLENOL) 325 MG tablet Take 650 mg by mouth as needed.    . Cholecalciferol (VITAMIN D3) 5000 units TABS Take 1 tablet by mouth daily.     . cyclobenzaprine (FLEXERIL) 10 MG tablet Take 1 tablet (10 mg total) by mouth as needed. 30 tablet 2  . folic acid (FOLVITE) 1 MG tablet Take 1 tablet (1 mg total) by mouth daily. 90 tablet 3  . Glucosamine HCl 1000 MG TABS Take 2 tablets by mouth daily.    Marland Kitchen levothyroxine (SYNTHROID) 137 MCG tablet Take 1 tablet (137 mcg total) by mouth daily before breakfast. 90 tablet 3  . Multiple Vitamin (MULTIVITAMIN) capsule Take 1 capsule by mouth daily.     . Omega-3 1000 MG CAPS Take 1 tablet by mouth daily.    . sildenafil (REVATIO) 20 MG tablet Use 5 tablets PO as needed 100 tablet 3   No current facility-administered medications for this visit.    PHYSICAL EXAMINATION: ECOG PERFORMANCE STATUS: 1 - Symptomatic but completely ambulatory  Today's Vitals   01/15/20 1116  BP: 119/75  Pulse: 60  Resp: 18  Temp: (!) 96.6 F (35.9 C)  TempSrc: Temporal  SpO2: 100%  Weight: 229 lb 6.4 oz (104.1 kg)  Height: 6' 1"  (1.854 m)  PainSc: 0-No pain   Body mass index is 30.27 kg/m.  Filed Weights   01/15/20 1116  Weight: 229 lb 6.4 oz (104.1 kg)    GENERAL: alert, no distress and comfortable, well appearing  SKIN: skin color, texture, turgor are normal, no rashes or significant lesions EYES: conjunctiva are pink and non-injected, sclera clear OROPHARYNX: no exudate, no erythema; lips, buccal mucosa, and tongue normal  NECK: supple, non-tender LUNGS: clear to auscultation with normal breathing effort HEART: regular rate & rhythm and no murmurs and no lower extremity  edema ABDOMEN: soft, non-tender, non-distended, normal bowel sounds Musculoskeletal: no cyanosis of digits and no clubbing  PSYCH: alert & oriented x 3, fluent speech  LABORATORY DATA:  I have reviewed the data as listed    Component Value Date/Time   NA 135 01/15/2020 1059   K 4.8 01/15/2020 1059   CL 101 01/15/2020 1059   CO2 28 01/15/2020 1059   GLUCOSE 109 (H) 01/15/2020 1059   BUN 16 01/15/2020 1059   CREATININE 1.03 01/15/2020 1059   CALCIUM 10.0 01/15/2020 1059   PROT 7.4 01/15/2020 1059   ALBUMIN 4.2 01/15/2020 1059   AST 41 01/15/2020 1059   ALT 42 01/15/2020 1059   ALKPHOS 98 01/15/2020 1059   BILITOT 0.8 01/15/2020 1059   GFRNONAA >60 01/15/2020 1059   GFRAA >60 01/15/2020 1059    No results found for: SPEP, UPEP  Lab Results  Component Value Date   WBC 3.4 (L) 01/15/2020   NEUTROABS 1.7 01/15/2020   HGB 13.5 01/15/2020   HCT 38.6 (L) 01/15/2020   MCV 99.7 01/15/2020   PLT 100 (L)  01/15/2020      Chemistry      Component Value Date/Time   NA 135 01/15/2020 1059   K 4.8 01/15/2020 1059   CL 101 01/15/2020 1059   CO2 28 01/15/2020 1059   BUN 16 01/15/2020 1059   CREATININE 1.03 01/15/2020 1059      Component Value Date/Time   CALCIUM 10.0 01/15/2020 1059   ALKPHOS 98 01/15/2020 1059   AST 41 01/15/2020 1059   ALT 42 01/15/2020 1059   BILITOT 0.8 01/15/2020 1059       RADIOGRAPHIC STUDIES: I have personally reviewed the radiological images as listed below and agreed with the findings in the report. No results found.

## 2020-01-17 ENCOUNTER — Ambulatory Visit: Payer: Medicare Other

## 2020-03-06 DIAGNOSIS — H409 Unspecified glaucoma: Secondary | ICD-10-CM

## 2020-03-06 HISTORY — DX: Unspecified glaucoma: H40.9

## 2020-03-17 ENCOUNTER — Other Ambulatory Visit (INDEPENDENT_AMBULATORY_CARE_PROVIDER_SITE_OTHER): Payer: Medicare Other

## 2020-03-17 DIAGNOSIS — B159 Hepatitis A without hepatic coma: Secondary | ICD-10-CM

## 2020-03-17 LAB — CBC WITH DIFFERENTIAL/PLATELET
Basophils Absolute: 0 10*3/uL (ref 0.0–0.1)
Basophils Relative: 0.8 % (ref 0.0–3.0)
Eosinophils Absolute: 0.1 10*3/uL (ref 0.0–0.7)
Eosinophils Relative: 2.5 % (ref 0.0–5.0)
HCT: 38.9 % — ABNORMAL LOW (ref 39.0–52.0)
Hemoglobin: 13.6 g/dL (ref 13.0–17.0)
Lymphocytes Relative: 33.7 % (ref 12.0–46.0)
Lymphs Abs: 1.3 10*3/uL (ref 0.7–4.0)
MCHC: 34.9 g/dL (ref 30.0–36.0)
MCV: 100.8 fl — ABNORMAL HIGH (ref 78.0–100.0)
Monocytes Absolute: 0.4 10*3/uL (ref 0.1–1.0)
Monocytes Relative: 11.4 % (ref 3.0–12.0)
Neutro Abs: 2 10*3/uL (ref 1.4–7.7)
Neutrophils Relative %: 51.6 % (ref 43.0–77.0)
Platelets: 98 10*3/uL — ABNORMAL LOW (ref 150.0–400.0)
RBC: 3.86 Mil/uL — ABNORMAL LOW (ref 4.22–5.81)
RDW: 13.1 % (ref 11.5–15.5)
WBC: 3.8 10*3/uL — ABNORMAL LOW (ref 4.0–10.5)

## 2020-03-17 LAB — HEPATIC FUNCTION PANEL
ALT: 39 U/L (ref 0–53)
AST: 42 U/L — ABNORMAL HIGH (ref 0–37)
Albumin: 4.2 g/dL (ref 3.5–5.2)
Alkaline Phosphatase: 110 U/L (ref 39–117)
Bilirubin, Direct: 0.1 mg/dL (ref 0.0–0.3)
Total Bilirubin: 0.6 mg/dL (ref 0.2–1.2)
Total Protein: 7.5 g/dL (ref 6.0–8.3)

## 2020-03-17 LAB — COMPREHENSIVE METABOLIC PANEL
ALT: 39 U/L (ref 0–53)
AST: 42 U/L — ABNORMAL HIGH (ref 0–37)
Albumin: 4.2 g/dL (ref 3.5–5.2)
Alkaline Phosphatase: 110 U/L (ref 39–117)
BUN: 15 mg/dL (ref 6–23)
CO2: 22 mEq/L (ref 19–32)
Calcium: 9.8 mg/dL (ref 8.4–10.5)
Chloride: 103 mEq/L (ref 96–112)
Creatinine, Ser: 1 mg/dL (ref 0.40–1.50)
GFR: 72.92 mL/min (ref >60.00)
Glucose, Bld: 137 mg/dL — ABNORMAL HIGH (ref 70–99)
Potassium: 4.5 mEq/L (ref 3.5–5.1)
Sodium: 133 mEq/L — ABNORMAL LOW (ref 135–145)
Total Bilirubin: 0.6 mg/dL (ref 0.2–1.2)
Total Protein: 7.5 g/dL (ref 6.0–8.3)

## 2020-03-17 LAB — TSH: TSH: 6.37 u[IU]/mL — ABNORMAL HIGH (ref 0.35–4.50)

## 2020-03-17 LAB — PROTIME-INR
INR: 1 ratio (ref 0.8–1.0)
Prothrombin Time: 11.7 s (ref 9.6–13.1)

## 2020-03-18 ENCOUNTER — Other Ambulatory Visit: Payer: Self-pay

## 2020-03-18 ENCOUNTER — Telehealth: Payer: Self-pay | Admitting: Gastroenterology

## 2020-03-18 DIAGNOSIS — B159 Hepatitis A without hepatic coma: Secondary | ICD-10-CM

## 2020-03-18 NOTE — Telephone Encounter (Signed)
See result note.  

## 2020-03-18 NOTE — Telephone Encounter (Signed)
Patient is returning your call.  

## 2020-03-21 ENCOUNTER — Encounter: Payer: Self-pay | Admitting: Gastroenterology

## 2020-04-02 DIAGNOSIS — H40011 Open angle with borderline findings, low risk, right eye: Secondary | ICD-10-CM | POA: Diagnosis not present

## 2020-04-20 ENCOUNTER — Other Ambulatory Visit: Payer: Self-pay | Admitting: Family Medicine

## 2020-04-21 ENCOUNTER — Other Ambulatory Visit: Payer: Self-pay | Admitting: Family Medicine

## 2020-04-21 NOTE — Telephone Encounter (Signed)
MEDICATION: Folic Acid 1 MG tablet  PHARMACY: [facility] [address]  Comments: Pt refill request was denied. Medication does not look like it's listed on their medication list. Pt has an appointment on 05/15/2020.  **Let patient know to contact pharmacy at the end of the day to make sure medication is ready. **  ** Please notify patient to allow 48-72 hours to process**  **Encourage patient to contact the pharmacy for refills or they can request refills through [facility]**

## 2020-04-25 ENCOUNTER — Other Ambulatory Visit: Payer: Self-pay

## 2020-04-25 ENCOUNTER — Ambulatory Visit (AMBULATORY_SURGERY_CENTER): Payer: Self-pay

## 2020-04-25 VITALS — Ht 73.0 in | Wt 235.0 lb

## 2020-04-25 DIAGNOSIS — Z86010 Personal history of colonic polyps: Secondary | ICD-10-CM

## 2020-04-25 NOTE — Progress Notes (Signed)
No egg or soy allergy known to patient  No issues with past sedation with any surgeries  or procedures, no intubation problems  No diet pills per patient No home 02 use per patient  No blood thinners per patient  Pt denies issues with constipation  No A fib or A flutter  EMMI video sent to pt's e mail   Pt completed covid vaccine series in April.  Due to the COVID-19 pandemic we are asking patients to follow these guidelines. Please only bring one care partner. Please be aware that your care partner may wait in the car in the parking lot or if they feel like they will be too hot to wait in the car, they may wait in the lobby on the 4th floor. All care partners are required to wear a mask the entire time (we do not have any that we can provide them), they need to practice social distancing, and we will do a Covid check for all patient's and care partners when you arrive. Also we will check their temperature and your temperature. If the care partner waits in their car they need to stay in the parking lot the entire time and we will call them on their cell phone when the patient is ready for discharge so they can bring the car to the front of the building. Also all patient's will need to wear a mask into building.

## 2020-05-08 ENCOUNTER — Encounter: Payer: Self-pay | Admitting: Gastroenterology

## 2020-05-14 ENCOUNTER — Other Ambulatory Visit: Payer: Self-pay

## 2020-05-14 ENCOUNTER — Encounter: Payer: Self-pay | Admitting: Gastroenterology

## 2020-05-14 ENCOUNTER — Ambulatory Visit (AMBULATORY_SURGERY_CENTER): Payer: Medicare Other | Admitting: Gastroenterology

## 2020-05-14 VITALS — BP 108/63 | HR 57 | Temp 96.1°F | Resp 12 | Ht 73.0 in | Wt 235.0 lb

## 2020-05-14 DIAGNOSIS — D125 Benign neoplasm of sigmoid colon: Secondary | ICD-10-CM | POA: Diagnosis not present

## 2020-05-14 DIAGNOSIS — Z86010 Personal history of colonic polyps: Secondary | ICD-10-CM

## 2020-05-14 DIAGNOSIS — D123 Benign neoplasm of transverse colon: Secondary | ICD-10-CM | POA: Diagnosis not present

## 2020-05-14 DIAGNOSIS — D122 Benign neoplasm of ascending colon: Secondary | ICD-10-CM

## 2020-05-14 MED ORDER — SODIUM CHLORIDE 0.9 % IV SOLN: 500.0000 mL | Freq: Once | INTRAVENOUS | Status: DC

## 2020-05-14 NOTE — Progress Notes (Signed)
Pt's states no medical or surgical changes since previsit or office visit. 

## 2020-05-14 NOTE — Progress Notes (Signed)
pt tolerated well. VSS. awake and to recovery. Report given to RN.  

## 2020-05-14 NOTE — Op Note (Signed)
[facility] Patient Name: Timothy Grant Procedure Date: 05/14/2020 8:28 AM MRN: [id number] Endoscopist: Thornton Park MD, MD Age: 74 Referring MD:  Date of Birth: 01-30-1946 Gender: Male Account #: [id number] Procedure:                Colonoscopy Indications:              Surveillance: Personal history of adenomatous                            polyps on last colonoscopy > 5 years ago                           Prior colonoscopy with Dr. Collene Mares > 5 years ago with                            removal of adenomas at that time Medicines:                Monitored Anesthesia Care Procedure:                Pre-Anesthesia Assessment:                           - Prior to the procedure, a History and Physical                            was performed, and patient medications and                            allergies were reviewed. The patient's tolerance of                            previous anesthesia was also reviewed. The risks                            and benefits of the procedure and the sedation                            options and risks were discussed with the patient.                            All questions were answered, and informed consent                            was obtained. Prior Anticoagulants: The patient has                            taken no previous anticoagulant or antiplatelet                            agents. ASA Grade Assessment: II - A patient with                            mild systemic disease. After reviewing the risks  and benefits, the patient was deemed in                            satisfactory condition to undergo the procedure.                           After obtaining informed consent, the colonoscope                            was passed under direct vision. Throughout the                            procedure, the patient's blood pressure, pulse, and                            oxygen saturations were monitored  continuously. The                            Colonoscope was introduced through the anus and                            advanced to the 3 cm into the ileum. A second                            forward view of the right colon was performed. The                            colonoscopy was performed without difficulty. The                            patient tolerated the procedure well. The quality                            of the bowel preparation was good. The terminal                            ileum, ileocecal valve, appendiceal orifice, and                            rectum were photographed. Scope In: 8:43:32 AM Scope Out: 9:00:32 AM Scope Withdrawal Time: 0 hours 14 minutes 57 seconds  Total Procedure Duration: 0 hours 17 minutes 0 seconds  Findings:                 The perianal and digital rectal examinations were                            normal.                           Non-bleeding internal hemorrhoids were found. The                            hemorrhoids were medium-sized.  A few small and large-mouthed diverticula were                            found in the sigmoid colon, descending colon and                            ascending colon.                           A 4 mm polyp was found in the sigmoid colon. The                            polyp was sessile. The polyp was removed with a                            cold snare. Resection and retrieval were complete.                            Estimated blood loss was minimal.                           Two sessile polyps were found in the splenic                            flexure. The polyps were 1 to 2 mm in size. These                            polyps were removed with a cold snare. Resection                            and retrieval were complete. Estimated blood loss                            was minimal.                           A 3 mm polyp was found in the hepatic flexure. The                             polyp was sessile. The polyp was removed with a                            cold snare. Resection and retrieval were complete.                            Estimated blood loss was minimal.                           A 3 mm polyp was found in the ascending colon. The                            polyp was sessile. The polyp was removed with a  cold snare. Resection and retrieval were complete.                            Estimated blood loss was minimal.                           The exam was otherwise without abnormality on                            direct and retroflexion views. Complications:            No immediate complications. Estimated blood loss:                            Minimal. Estimated Blood Loss:     Estimated blood loss was minimal. Impression:               - Non-bleeding internal hemorrhoids.                           - Diverticulosis in the sigmoid colon, in the                            descending colon and in the ascending colon.                           - One 4 mm polyp in the sigmoid colon, removed with                            a cold snare. Resected and retrieved.                           - Two 1 to 2 mm polyps at the splenic flexure,                            removed with a cold snare. Resected and retrieved.                           - One 3 mm polyp at the hepatic flexure, removed                            with a cold snare. Resected and retrieved.                           - One 3 mm polyp in the ascending colon, removed                            with a cold snare. Resected and retrieved.                           - The examination was otherwise normal on direct                            and retroflexion views. Recommendation:           -  Patient has a contact number available for                            emergencies. The signs and symptoms of potential                            delayed complications were discussed with  the                            patient. Return to normal activities tomorrow.                            Written discharge instructions were provided to the                            patient.                           - Follow a high fiber diet. Drink at least 64                            ounces of water daily. Add a daily stool bulking                            agent such as psyllium (an exampled would be                            Metamucil).                           - Continue present medications.                           - Await pathology results.                           - Repeat colonoscopy date to be determined after                            pending pathology results are reviewed for                            surveillance based on pathology results.                           - Emerging evidence supports eating a diet of                            fruits, vegetables, grains, calcium, and yogurt                            while reducing red meat and alcohol may reduce the                            risk of colon cancer.                           -  Thank you for allowing me to be involved in your                            colon cancer prevention. Thornton Park MD, MD 05/14/2020 9:09:48 AM This report has been signed electronically.

## 2020-05-14 NOTE — Progress Notes (Signed)
VO Lidocaine 2% 72mL with propofol for patient comfort.

## 2020-05-14 NOTE — Progress Notes (Signed)
Called to room to assist during endoscopic procedure.  Patient ID and intended procedure confirmed with present staff. Received instructions for my participation in the procedure from the performing physician.  

## 2020-05-14 NOTE — Patient Instructions (Addendum)
Read all of the handouts given to you by your recovery room nurse.  Thank-you for choosing [facility] for your healthcare needs today.  YOU HAD AN ENDOSCOPIC PROCEDURE TODAY AT [facility]:   Refer to the procedure report that was given to you for any specific questions about what was found during the examination.  If the procedure report does not answer your questions, please call your gastroenterologist to clarify.  If you requested that your care partner not be given the details of your procedure findings, then the procedure report has been included in a sealed envelope for you to review at your convenience later.  YOU SHOULD EXPECT: Some feelings of bloating in the abdomen. Passage of more gas than usual.  Walking can help get rid of the air that was put into your GI tract during the procedure and reduce the bloating. If you had a lower endoscopy (such as a colonoscopy or flexible sigmoidoscopy) you may notice spotting of blood in your stool or on the toilet paper. If you underwent a bowel prep for your procedure, you may not have a normal bowel movement for a few days.  Please Note:  You might notice some irritation and congestion in your nose or some drainage.  This is from the oxygen used during your procedure.  There is no need for concern and it should clear up in a day or so.  SYMPTOMS TO REPORT IMMEDIATELY:   Following lower endoscopy (colonoscopy or flexible sigmoidoscopy):  Excessive amounts of blood in the stool  Significant tenderness or worsening of abdominal pains  Swelling of the abdomen that is new, acute  Fever of 100F or higher   For urgent or emergent issues, a gastroenterologist can be reached at any hour by calling [phone]. Do not use MyChart messaging for urgent concerns.    DIET:  We do recommend a small meal at first, but then you may proceed to your regular diet.  Drink plenty of fluids but you should avoid alcoholic beverages for 24 hours. Try to  increase the fiber in your diet, and drink ;lenty of water.   We also suggest metamucil every day.  Try to reduce your red meat intake, and limit alcohol.  ACTIVITY:  You should plan to take it easy for the rest of today and you should NOT DRIVE or use heavy machinery until tomorrow (because of the sedation medicines used during the test).    FOLLOW UP: Our staff will call the number listed on your records 48-72 hours following your procedure to check on you and address any questions or concerns that you may have regarding the information given to you following your procedure. If we do not reach you, we will leave a message.  We will attempt to reach you two times.  During this call, we will ask if you have developed any symptoms of COVID 19. If you develop any symptoms (ie: fever, flu-like symptoms, shortness of breath, cough etc.) before then, please call [phone].  If you test positive for Covid 19 in the 2 weeks post procedure, please call and report this information to [place].    If any biopsies were taken you will be contacted by phone or by letter within the next 1-3 weeks.  Please call us at [phone] if you have not heard about the biopsies in 3 weeks.    SIGNATURES/CONFIDENTIALITY: You and/or your care partner have signed paperwork which will be entered into your electronic medical record.  These signatures attest to the fact that that the information above on your After Visit Summary has been reviewed and is understood.  Full responsibility of the confidentiality of this discharge information lies with you and/or your care-partner.

## 2020-05-15 ENCOUNTER — Encounter: Payer: Self-pay | Admitting: Family Medicine

## 2020-05-15 ENCOUNTER — Telehealth: Payer: Self-pay

## 2020-05-15 ENCOUNTER — Other Ambulatory Visit: Payer: Self-pay

## 2020-05-15 ENCOUNTER — Ambulatory Visit (INDEPENDENT_AMBULATORY_CARE_PROVIDER_SITE_OTHER): Payer: Medicare Other | Admitting: Family Medicine

## 2020-05-15 VITALS — BP 118/70 | HR 53 | Temp 97.6°F | Resp 18 | Ht 73.0 in | Wt 230.8 lb

## 2020-05-15 DIAGNOSIS — D126 Benign neoplasm of colon, unspecified: Secondary | ICD-10-CM | POA: Diagnosis not present

## 2020-05-15 DIAGNOSIS — D696 Thrombocytopenia, unspecified: Secondary | ICD-10-CM | POA: Diagnosis not present

## 2020-05-15 DIAGNOSIS — N529 Male erectile dysfunction, unspecified: Secondary | ICD-10-CM

## 2020-05-15 DIAGNOSIS — E782 Mixed hyperlipidemia: Secondary | ICD-10-CM

## 2020-05-15 DIAGNOSIS — K769 Liver disease, unspecified: Secondary | ICD-10-CM

## 2020-05-15 DIAGNOSIS — E039 Hypothyroidism, unspecified: Secondary | ICD-10-CM | POA: Diagnosis not present

## 2020-05-15 DIAGNOSIS — Z0000 Encounter for general adult medical examination without abnormal findings: Secondary | ICD-10-CM

## 2020-05-15 DIAGNOSIS — E222 Syndrome of inappropriate secretion of antidiuretic hormone: Secondary | ICD-10-CM

## 2020-05-15 DIAGNOSIS — Z8619 Personal history of other infectious and parasitic diseases: Secondary | ICD-10-CM | POA: Insufficient documentation

## 2020-05-15 DIAGNOSIS — D72819 Decreased white blood cell count, unspecified: Secondary | ICD-10-CM

## 2020-05-15 LAB — T4, FREE: Free T4: 0.89 ng/dL (ref 0.60–1.60)

## 2020-05-15 LAB — CBC WITH DIFFERENTIAL/PLATELET
Basophils Absolute: 0 10*3/uL (ref 0.0–0.1)
Basophils Relative: 0.7 % (ref 0.0–3.0)
Eosinophils Absolute: 0.1 10*3/uL (ref 0.0–0.7)
Eosinophils Relative: 2.2 % (ref 0.0–5.0)
HCT: 38.6 % — ABNORMAL LOW (ref 39.0–52.0)
Hemoglobin: 13.6 g/dL (ref 13.0–17.0)
Lymphocytes Relative: 34.8 % (ref 12.0–46.0)
Lymphs Abs: 1.2 10*3/uL (ref 0.7–4.0)
MCHC: 35.3 g/dL (ref 30.0–36.0)
MCV: 100.1 fl — ABNORMAL HIGH (ref 78.0–100.0)
Monocytes Absolute: 0.4 10*3/uL (ref 0.1–1.0)
Monocytes Relative: 10.2 % (ref 3.0–12.0)
Neutro Abs: 1.8 10*3/uL (ref 1.4–7.7)
Neutrophils Relative %: 52.1 % (ref 43.0–77.0)
Platelets: 105 10*3/uL — ABNORMAL LOW (ref 150.0–400.0)
RBC: 3.86 Mil/uL — ABNORMAL LOW (ref 4.22–5.81)
RDW: 13.6 % (ref 11.5–15.5)
WBC: 3.5 10*3/uL — ABNORMAL LOW (ref 4.0–10.5)

## 2020-05-15 LAB — LIPID PANEL
Cholesterol: 241 mg/dL — ABNORMAL HIGH (ref 0–200)
HDL: 52.7 mg/dL (ref >39.00)
LDL Cholesterol: 168 mg/dL — ABNORMAL HIGH (ref 0–99)
NonHDL: 188.63
Total CHOL/HDL Ratio: 5
Triglycerides: 101 mg/dL (ref 0.0–149.0)
VLDL: 20.2 mg/dL (ref 0.0–40.0)

## 2020-05-15 LAB — COMPREHENSIVE METABOLIC PANEL
ALT: 23 U/L (ref 0–53)
AST: 33 U/L (ref 0–37)
Albumin: 4.3 g/dL (ref 3.5–5.2)
Alkaline Phosphatase: 97 U/L (ref 39–117)
BUN: 16 mg/dL (ref 6–23)
CO2: 23 mEq/L (ref 19–32)
Calcium: 9.6 mg/dL (ref 8.4–10.5)
Chloride: 102 mEq/L (ref 96–112)
Creatinine, Ser: 0.97 mg/dL (ref 0.40–1.50)
GFR: 75.49 mL/min (ref >60.00)
Glucose, Bld: 103 mg/dL — ABNORMAL HIGH (ref 70–99)
Potassium: 4 mEq/L (ref 3.5–5.1)
Sodium: 133 mEq/L — ABNORMAL LOW (ref 135–145)
Total Bilirubin: 0.5 mg/dL (ref 0.2–1.2)
Total Protein: 7.3 g/dL (ref 6.0–8.3)

## 2020-05-15 LAB — TSH: TSH: 9.2 u[IU]/mL — ABNORMAL HIGH (ref 0.35–4.50)

## 2020-05-15 LAB — T3: T3, Total: 90 ng/dL (ref 76–181)

## 2020-05-15 MED ORDER — SILDENAFIL CITRATE 20 MG PO TABS: ORAL_TABLET | ORAL | 3 refills | Status: DC

## 2020-05-15 MED ORDER — CYCLOBENZAPRINE HCL 10 MG PO TABS: 10.0000 mg | ORAL_TABLET | ORAL | 2 refills | Status: DC | PRN

## 2020-05-15 MED ORDER — FOLIC ACID 1 MG PO TABS: 1.0000 mg | ORAL_TABLET | Freq: Every day | ORAL | 3 refills | Status: DC

## 2020-05-15 NOTE — Telephone Encounter (Signed)
-----   Message from Thornton Park, MD sent at 05/14/2020  9:09 AM EDT ----- Patient needs ultrasound with elastography to evaluate for advanced liver disease in the setting of thrombocytopenia and abnormal ultrasound. He should have office visit with me scheduled after the elastography. Thank you!

## 2020-05-15 NOTE — Progress Notes (Signed)
Subjective  Chief Complaint  Patient presents with  . Annual Exam    Coffe with a little cream. Revatio(written rx) and Flexeril   . Hyperlipidemia    Would like to discuss his levels.     HPI: Timothy Grant is a 74 y.o. male who presents to [facility] today for a Male Wellness Visit. He also has the concerns and/or needs as listed above in the chief complaint. These will be addressed in addition to the Health Maintenance Visit.   Wellness Visit: annual visit with health maintenance review and exam    HM: had colonoscopy yesterday. 5 polyps; path pending. Feels well. Energy back to normal. No mood problems. Doing well overall. Had eye exam: "glaucoma suspect" Lifestyle: Body mass index is 30.45 kg/m. Wt Readings from Last 3 Encounters:  05/15/20 230 lb 12.8 oz (104.7 kg)  05/14/20 235 lb (106.6 kg)  04/25/20 235 lb (106.6 kg)    Chronic disease management visit and/or acute problem visit:  History of hep A: will have ultrasound soon to check for any liver damage/fibrosis. LFTS are close to normal now.   Low plt/cbc:stable and work up ongoing per hematology. No sxs.   HLD: ready to restart crestor if cleared by GI. H/o hepatitis and rhabdo. Would need close f/u  Low thyroid:feels well. Due for recheck.TSH had been trending up.   Colon polyps: had surveillance colonoscopy yesterday  SIADH: stable sodium. No swelling  ED: needs refills.   No visits with results within 1 Day(s) from this visit.  Latest known visit with results is:  Appointment on 03/17/2020  Component Date Value Ref Range Status  . Total Bilirubin 03/17/2020 0.6  0.2 - 1.2 mg/dL Final  . Bilirubin, Direct 03/17/2020 0.1  0.0 - 0.3 mg/dL Final  . Alkaline Phosphatase 03/17/2020 110  39 - 117 U/L Final  . AST 03/17/2020 42* 0 - 37 U/L Final  . ALT 03/17/2020 39  0 - 53 U/L Final  . Total Protein 03/17/2020 7.5  6.0 - 8.3 g/dL Final  . Albumin 03/17/2020 4.2  3.5 - 5.2  g/dL Final  . TSH 03/17/2020 6.37* 0.35 - 4.50 uIU/mL Final  . WBC 03/17/2020 3.8* 4.0 - 10.5 K/uL Final  . RBC 03/17/2020 3.86* 4.22 - 5.81 Mil/uL Final  . Hemoglobin 03/17/2020 13.6  13.0 - 17.0 g/dL Final  . HCT 03/17/2020 38.9* 39 - 52 % Final  . MCV 03/17/2020 100.8* 78.0 - 100.0 fl Final  . MCHC 03/17/2020 34.9  30.0 - 36.0 g/dL Final  . RDW 03/17/2020 13.1  11.5 - 15.5 % Final  . Platelets 03/17/2020 98.0* 150 - 400 K/uL Final  . Neutrophils Relative % 03/17/2020 51.6  43 - 77 % Final  . Lymphocytes Relative 03/17/2020 33.7  12 - 46 % Final  . Monocytes Relative 03/17/2020 11.4  3 - 12 % Final  . Eosinophils Relative 03/17/2020 2.5  0 - 5 % Final  . Basophils Relative 03/17/2020 0.8  0 - 3 % Final  . Neutro Abs 03/17/2020 2.0  1.4 - 7.7 K/uL Final  . Lymphs Abs 03/17/2020 1.3  0.7 - 4.0 K/uL Final  . Monocytes Absolute 03/17/2020 0.4  0 - 1 K/uL Final  . Eosinophils Absolute 03/17/2020 0.1  0 - 0 K/uL Final  . Basophils Absolute 03/17/2020 0.0  0 - 0 K/uL Final  . Sodium 03/17/2020 133* 135 - 145 mEq/L Final  . Potassium 03/17/2020 4.5  3.5 - 5.1 mEq/L  Final  . Chloride 03/17/2020 103  96 - 112 mEq/L Final  . CO2 03/17/2020 22  19 - 32 mEq/L Final  . Glucose, Bld 03/17/2020 137* 70 - 99 mg/dL Final  . BUN 03/17/2020 15  6 - 23 mg/dL Final  . Creatinine, Ser 03/17/2020 1.00  0.40 - 1.50 mg/dL Final  . Total Bilirubin 03/17/2020 0.6  0.2 - 1.2 mg/dL Final  . Alkaline Phosphatase 03/17/2020 110  39 - 117 U/L Final  . AST 03/17/2020 42* 0 - 37 U/L Final  . ALT 03/17/2020 39  0 - 53 U/L Final  . Total Protein 03/17/2020 7.5  6.0 - 8.3 g/dL Final  . Albumin 03/17/2020 4.2  3.5 - 5.2 g/dL Final  . GFR 03/17/2020 72.92  >60.00 mL/min Final  . Calcium 03/17/2020 9.8  8.4 - 10.5 mg/dL Final  . INR 03/17/2020 1.0  0.8 - 1.0 ratio Final  . Prothrombin Time 03/17/2020 11.7  9.6 - 13.1 sec Final   Lab Results  Component Value Date   TSH 6.37 (H) 03/17/2020    Patient Active  Problem List   Diagnosis Date Noted  . Acquired hypothyroidism 02/05/2011  . Adenomatous colon polyp 02/05/2011  . Mixed hyperlipidemia 02/05/2011  . Osteoarthritis of finger of left hand 01/07/2017  . Leukopenia 08/09/2012  . Thrombocytopenia ([place]) 07/09/2019  . Trigger finger, left ring finger 01/07/2017  . Male erectile disorder 02/09/2012  . History of hepatitis A 05/15/2020  . SIADH (syndrome of inappropriate ADH production) ([place]) 05/15/2020   Health Maintenance  Topic Date Due  . INFLUENZA VACCINE  07/06/2020  . TETANUS/TDAP  02/04/2021  . COLONOSCOPY  05/14/2025  . COVID-19 Vaccine  Completed  . Hepatitis C Screening  Completed  . PNA vac Low Risk Adult  Completed   Immunization History  Administered Date(s) Administered  . Fluad Quad(high Dose 65+) 08/14/2019  . Hepatitis B, ped/adol 08/06/2009  . Influenza Split 08/07/2011, 08/28/2013  . Influenza, High Dose Seasonal PF 09/26/2017  . Influenza, Quadrivalent, Recombinant, Inj, Pf 09/06/2016  . Influenza, Seasonal, Injecte, Preservative Fre 09/04/2014, 09/09/2015  . Influenza,trivalent, recombinat, inj, PF 08/09/2012  . Influenza-Unspecified 09/22/2018  . Moderna SARS-COVID-2 Vaccination 03/06/2020, 04/03/2020  . Pneumococcal Conjugate-13 11/04/2014  . Pneumococcal Polysaccharide-23 02/05/2011  . Tdap 02/05/2011  . Zoster 02/05/2011   We updated and reviewed the patient's past history in detail and it is documented below. Allergies: Patient has No Known Allergies. Past Medical History  has a past medical history of Adenomatous polyps, Allergy, Arthritis, ED (erectile dysfunction), Glaucoma (03/2020), Hepatitis A, Hyperlipidemia, Hypothyroidism, Non-traumatic rhabdomyolysis (02/21/2019), and Varicocele (02/01/2011). Past Surgical History Patient  has a past surgical history that includes Tonsillectomy; Eye surgery (Right, 1956); Eye surgery (Left, 1963); Liver biopsy; Colonoscopy (2014); and Polypectomy. Social  History Patient  reports that he has quit smoking. His smoking use included cigarettes. He quit after 10.00 years of use. He has never used smokeless tobacco. He reports current alcohol use of about 6.0 standard drinks of alcohol per week. He reports that he does not use drugs. Family History family history includes Alzheimer's disease in his mother; Arthritis in his mother; Cancer in his brother and sister; Colon polyps in his brother; Dementia in his mother; Heart disease in his father and paternal grandmother; Hyperlipidemia in his brother; Stroke in his brother and mother. Review of Systems: Constitutional: negative for fever or malaise Ophthalmic: negative for photophobia, double vision or loss of vision Cardiovascular: negative for chest pain, dyspnea on exertion, or new  LE swelling Respiratory: negative for SOB or persistent cough Gastrointestinal: negative for abdominal pain, change in bowel habits or melena Genitourinary: negative for dysuria or gross hematuria Musculoskeletal: negative for new gait disturbance or muscular weakness Integumentary: negative for new or persistent rashes Neurological: negative for TIA or stroke symptoms Psychiatric: negative for SI or delusions Allergic/Immunologic: negative for hives  Patient Care Team    Relationship Specialty Notifications Start End  [name], MD PCP - General Family Medicine  11/07/17   Stephannie Li, [place]  Ophthalmology  03/22/18   Daryll Brod, MD Consulting Physician Orthopedic Surgery  03/22/18   [place]  Dentistry  03/22/18   Juanita Craver, MD Consulting Physician Gastroenterology  03/22/18   Thornton Park, MD Consulting Physician Gastroenterology  03/27/19   Tish Men, MD Consulting Physician Hematology  08/22/19    Objective  Vitals: BP 118/70   Pulse (!) 53   Temp 97.6 F (36.4 C) (Temporal)   Resp 18   Ht 6\' 1"  (1.854 m)   Wt 230 lb 12.8 oz (104.7 kg)   SpO2 95%   BMI 30.45 kg/m  General:  Well developed,  well nourished, no acute distress  Psych:  Alert and orientedx3,normal mood and affect HEENT:  Normocephalic, atraumatic, non-icteric sclera, PERRL,  supple neck without adenopathy, mass or thyromegaly Cardiovascular:  Normal S1, S2, RRR without gallop, rub or murmur, nondisplaced PMI, +2 distal pulses in bilateral upper and lower extremities. Respiratory:  Good breath sounds bilaterally, CTAB with normal respiratory effort Gastrointestinal: normal bowel sounds, soft, non-tender, no noted masses. No HSM MSK: no deformities, contusions. Joints are without erythema or swelling. Spine and CVA region are nontender Skin:  Warm, no rashes or suspicious lesions noted Neurologic:    Mental status is normal. CN 2-11 are normal. Gross motor and sensory exams are normal. Stable gait. No tremor GU: No inguinal hernias or adenopathy are appreciated bilaterally   Assessment  1. Annual physical exam   2. Acquired hypothyroidism   3. Adenomatous polyp of colon, unspecified part of colon   4. Mixed hyperlipidemia   5. Leukopenia, unspecified type   6. SIADH (syndrome of inappropriate ADH production) ([place])   7. History of hepatitis A   8. Thrombocytopenia ([place])   9. Male erectile disorder      Plan  Male Wellness Visit:  Age appropriate Health Maintenance and Prevention measures were discussed with patient. Included topics are cancer screening recommendations, ways to keep healthy (see AVS) including dietary and exercise recommendations, regular eye and dental care, use of seat belts, and avoidance of moderate alcohol use and tobacco use. Screens are up to date.  BMI: discussed patient's BMI and encouraged positive lifestyle modifications to help get to or maintain a target BMI.  HM needs and immunizations were addressed and ordered. See below for orders. See HM and immunization section for updates.  Routine labs and screening tests ordered including cmp, cbc and lipids where appropriate.  Discussed  recommendations regarding Vit D and calcium supplementation (see AVS)  Chronic disease f/u and/or acute problem visit: (deemed necessary to be done in addition to the wellness visit):  Low thyroid: recheck today and adjust levothyroxine dose if TSH remains elevated.   SIADH and low sodium; has been stable w/o fluid restriction or salt tablets. Recheck today. euvolemic on exam today  HLD: have been holding statin due to hepatitis infection. Will recheck levels today, and if OK with Dr. Tarri Glenn, restart low dose statin.   Leukopenic, thrombocytopenic: recheck. Stable  and following with hematology. Restart folic acid.   ED: responsive to revatio. Refilled today.  Follow up: Return in about 6 months (around 11/14/2020) for recheck.   Commons side effects, risks, benefits, and alternatives for medications and treatment plan prescribed today were discussed, and the patient expressed understanding of the given instructions. Patient is instructed to call or message via MyChart if he/she has any questions or concerns regarding our treatment plan. No barriers to understanding were identified. We discussed Red Flag symptoms and signs in detail. Patient expressed understanding regarding what to do in case of urgent or emergency type symptoms.   Medication list was reconciled, printed and provided to the patient in AVS. Patient instructions and summary information was reviewed with the patient as documented in the AVS. This note was prepared with assistance of Dragon voice recognition software. Occasional wrong-word or sound-a-like substitutions may have occurred due to the inherent limitations of voice recognition software  This visit occurred during the SARS-CoV-2 public health emergency.  Safety protocols were in place, including screening questions prior to the visit, additional usage of staff PPE, and extensive cleaning of exam room while observing appropriate contact time as indicated for disinfecting  solutions.   Orders Placed This Encounter  Procedures  . CBC with Differential/Platelet  . Comprehensive metabolic panel  . Lipid panel  . TSH  . T4, free  . T3   Meds ordered this encounter  Medications  . folic acid (FOLVITE) 1 MG tablet    Sig: Take 1 tablet (1 mg total) by mouth daily.    Dispense:  100 tablet    Refill:  3  . sildenafil (REVATIO) 20 MG tablet    Sig: Use 5 tablets PO as needed    Dispense:  100 tablet    Refill:  3  . cyclobenzaprine (FLEXERIL) 10 MG tablet    Sig: Take 1 tablet (10 mg total) by mouth as needed.    Dispense:  30 tablet    Refill:  2

## 2020-05-15 NOTE — Patient Instructions (Signed)
Please return in 6 months for recheck.   I will release your lab results to you on your MyChart account with further instructions. Please reply with any questions.  I'll let you know about your thyroid and cholesterol medicines once the labs return.   If you have any questions or concerns, please don't hesitate to send me a message via MyChart or call the office at [phone]. Thank you for visiting with [place] today! It's our pleasure caring for you.

## 2020-05-15 NOTE — Telephone Encounter (Signed)
Pt scheduled for [place] of abd with elastography at [facility] 05/21/20 pt to arrive there at 10:15am, appt at 10:30am. Pt to be NPO after midnight. Pt aware of appt.

## 2020-05-16 ENCOUNTER — Telehealth: Payer: Self-pay

## 2020-05-16 NOTE — Telephone Encounter (Signed)
  Follow up Call-  Call back number 05/14/2020  Post procedure Call Back phone  # [phone]  Permission to leave phone message Yes  Some recent data might be hidden     Patient questions:  Do you have a fever, pain , or abdominal swelling? No. Pain Score  0 *  Have you tolerated food without any problems? Yes.    Have you been able to return to your normal activities? Yes.    Do you have any questions about your discharge instructions: Diet   No. Medications  No. Follow up visit  No.  Do you have questions or concerns about your Care? No.  Actions: * If pain score is 4 or above: No action needed, pain <4.  1. Have you developed a fever since your procedure? no  2.   Have you had an respiratory symptoms (SOB or cough) since your procedure? no  3.   Have you tested positive for COVID 19 since your procedure no  4.   Have you had any family members/close contacts diagnosed with the COVID 19 since your procedure?  no   If yes to any of these questions please route to Joylene John, RN and Erenest Rasher, RN

## 2020-05-19 ENCOUNTER — Other Ambulatory Visit: Payer: Self-pay

## 2020-05-19 ENCOUNTER — Encounter: Payer: Self-pay | Admitting: Gastroenterology

## 2020-05-19 MED ORDER — LEVOTHYROXINE SODIUM 150 MCG PO TABS
150.0000 ug | ORAL_TABLET | Freq: Every day | ORAL | 3 refills | Status: DC
Start: 2020-05-19 — End: 2021-05-18

## 2020-05-19 MED ORDER — ROSUVASTATIN CALCIUM 10 MG PO TABS
10.0000 mg | ORAL_TABLET | Freq: Every day | ORAL | 3 refills | Status: DC
Start: 2020-05-19 — End: 2021-05-15

## 2020-05-21 ENCOUNTER — Other Ambulatory Visit: Payer: Self-pay

## 2020-05-21 ENCOUNTER — Ambulatory Visit (HOSPITAL_COMMUNITY)
Admission: RE | Admit: 2020-05-21 | Discharge: 2020-05-21 | Disposition: A | Discharge status: Home / Self Care | Payer: Medicare Other | Source: Ambulatory Visit | Attending: Gastroenterology | Admitting: Gastroenterology

## 2020-05-21 DIAGNOSIS — K769 Liver disease, unspecified: Secondary | ICD-10-CM | POA: Diagnosis not present

## 2020-05-21 DIAGNOSIS — K8020 Calculus of gallbladder without cholecystitis without obstruction: Secondary | ICD-10-CM | POA: Diagnosis not present

## 2020-06-20 ENCOUNTER — Other Ambulatory Visit (INDEPENDENT_AMBULATORY_CARE_PROVIDER_SITE_OTHER): Payer: Medicare Other

## 2020-06-20 DIAGNOSIS — D696 Thrombocytopenia, unspecified: Secondary | ICD-10-CM

## 2020-06-20 DIAGNOSIS — B159 Hepatitis A without hepatic coma: Secondary | ICD-10-CM

## 2020-06-20 LAB — COMPREHENSIVE METABOLIC PANEL
ALT: 33 U/L (ref 0–53)
AST: 33 U/L (ref 0–37)
Albumin: 4.3 g/dL (ref 3.5–5.2)
Alkaline Phosphatase: 87 U/L (ref 39–117)
BUN: 20 mg/dL (ref 6–23)
CO2: 26 mEq/L (ref 19–32)
Calcium: 9.8 mg/dL (ref 8.4–10.5)
Chloride: 103 mEq/L (ref 96–112)
Creatinine, Ser: 0.99 mg/dL (ref 0.40–1.50)
GFR: 73.72 mL/min (ref >60.00)
Glucose, Bld: 98 mg/dL (ref 70–99)
Potassium: 4 mEq/L (ref 3.5–5.1)
Sodium: 136 mEq/L (ref 135–145)
Total Bilirubin: 0.6 mg/dL (ref 0.2–1.2)
Total Protein: 7.3 g/dL (ref 6.0–8.3)

## 2020-06-20 LAB — CBC WITH DIFFERENTIAL/PLATELET
Basophils Absolute: 0 10*3/uL (ref 0.0–0.1)
Basophils Relative: 0.6 % (ref 0.0–3.0)
Eosinophils Absolute: 0.1 10*3/uL (ref 0.0–0.7)
Eosinophils Relative: 3 % (ref 0.0–5.0)
HCT: 36.7 % — ABNORMAL LOW (ref 39.0–52.0)
Hemoglobin: 12.9 g/dL — ABNORMAL LOW (ref 13.0–17.0)
Lymphocytes Relative: 37.4 % (ref 12.0–46.0)
Lymphs Abs: 1.4 10*3/uL (ref 0.7–4.0)
MCHC: 35.1 g/dL (ref 30.0–36.0)
MCV: 101.5 fl — ABNORMAL HIGH (ref 78.0–100.0)
Monocytes Absolute: 0.5 10*3/uL (ref 0.1–1.0)
Monocytes Relative: 11.9 % (ref 3.0–12.0)
Neutro Abs: 1.8 10*3/uL (ref 1.4–7.7)
Neutrophils Relative %: 47.1 % (ref 43.0–77.0)
Platelets: 83 10*3/uL — ABNORMAL LOW (ref 150.0–400.0)
RBC: 3.62 Mil/uL — ABNORMAL LOW (ref 4.22–5.81)
RDW: 13.3 % (ref 11.5–15.5)
WBC: 3.8 10*3/uL — ABNORMAL LOW (ref 4.0–10.5)

## 2020-06-20 LAB — PROTIME-INR
INR: 1.2 ratio — ABNORMAL HIGH (ref 0.8–1.0)
Prothrombin Time: 13.3 s — ABNORMAL HIGH (ref 9.6–13.1)

## 2020-06-20 LAB — HEPATIC FUNCTION PANEL
ALT: 33 U/L (ref 0–53)
AST: 33 U/L (ref 0–37)
Albumin: 4.3 g/dL (ref 3.5–5.2)
Alkaline Phosphatase: 87 U/L (ref 39–117)
Bilirubin, Direct: 0.1 mg/dL (ref 0.0–0.3)
Total Bilirubin: 0.6 mg/dL (ref 0.2–1.2)
Total Protein: 7.3 g/dL (ref 6.0–8.3)

## 2020-06-20 LAB — TSH: TSH: 1.81 u[IU]/mL (ref 0.35–4.50)

## 2020-06-25 ENCOUNTER — Other Ambulatory Visit: Payer: Self-pay

## 2020-06-25 DIAGNOSIS — E039 Hypothyroidism, unspecified: Secondary | ICD-10-CM

## 2020-06-25 DIAGNOSIS — E782 Mixed hyperlipidemia: Secondary | ICD-10-CM

## 2020-07-07 ENCOUNTER — Encounter: Payer: Self-pay | Admitting: Gastroenterology

## 2020-07-07 ENCOUNTER — Ambulatory Visit: Payer: Medicare Other | Admitting: Gastroenterology

## 2020-07-07 VITALS — BP 118/72 | HR 61 | Ht 73.0 in | Wt 236.0 lb

## 2020-07-07 DIAGNOSIS — D696 Thrombocytopenia, unspecified: Secondary | ICD-10-CM

## 2020-07-07 DIAGNOSIS — K769 Liver disease, unspecified: Secondary | ICD-10-CM | POA: Diagnosis not present

## 2020-07-07 NOTE — Patient Instructions (Addendum)
If you are age 75 or older, your body mass index should be between 23-30. Your Body mass index is 31.14 kg/m. If this is out of the aforementioned range listed, please consider follow up with your Primary Care Provider.  If you are age 75 or younger, your body mass index should be between 19-25. Your Body mass index is 31.14 kg/m. If this is out of the aformentioned range listed, please consider follow up with your Primary Care Provider.   You have been scheduled for an abdominal ultrasound at [facility] Radiology (1st floor of hospital) on 11/06/2020 at 9:15am. Please arrive 15 minutes prior to your appointment for registration. Make certain not to have anything to eat or drink 8 hours prior to your appointment. Should you need to reschedule your appointment, please contact radiology at [phone]. This test typically takes about 30 minutes to perform.  It is important to keep your liver as healthy as possible and avoid anything that can damage your liver. Here are some important things you should do.  - Don't drink too much alcohol. How much is too much remains controversial, but it's probably best to avoid alcohol completely. - Make sure that none of your medications, herbs, and supplements are toxic to the liver. Even acetaminophen (the generic ingredient in Tylenol and some cold medicines) may be harmful if you take too much for too long, especially if you have liver disease or drink alcohol heavily. - Work to maintain a health weight through diet and exercise.  - Get regular screening tests for liver cancer every 6 months.  Please call me or MyChart me with any questions or concerns prior to your next appointment.   Due to recent changes in healthcare laws, you may see the results of your imaging and laboratory studies on MyChart before your provider has had a chance to review them.  We understand that in some cases there may be results that are confusing or concerning to you. Not all  laboratory results come back in the same time frame and the provider may be waiting for multiple results in order to interpret others.  Please give [place] 48 hours in order for your provider to thoroughly review all the results before contacting the office for clarification of your results.

## 2020-07-07 NOTE — Progress Notes (Signed)
Referring Provider: [name], MD Primary Care Physician:  [name], MD Hepatologist: Dorcas Mcmurray at [facility]  Chief complaint: hepatitis A   IMPRESSION:  Cholestatic, relapsing hepatitis A presenting with jaundice and fatigue, resolving    - presented 12/2018 after exposure in 10/2018    - liver biopsy at [facility] 01/24/19: severe acute hepatitis with focal areas of confluent necrosis    - Minimal to mild early portal/periportal fibrosis    - In situ hybridization for EBV virus encoded RNA was negative Pancytopenia with pronounced persistent thrombocytopenia Abnormal gallbladder ultrasound 12/29/2018    - echogenic liver    - diffusely thickened and edematous gallbladder, no pericholecystic fluid    - negative sonographic Murphy sign, no biliary dilatation History of colon polyps    - colonoscopy with Dr. Collene Mares    -5 tubular adenomas removed 05/2020  He has recovered from his relapsing cholestatic hepatitis A.  However, there is evidence for suspected underlying advanced liver disease despite his recent elastography results.  That being said, his thrombocytopenia may be related to ongoing alcohol use.  We discussed continuing close surveillance with abdominal imaging and labs every 6 months.  We also reviewed strategies to prioritize liver health.  Abstinence from alcohol recommended.  History of colon polyps: Surveillance colonoscopy recommended in 3 years.  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 The interval history is obtained through the patient and review of his electronic health record. His wife did not come to this appointment. She will be having a lung biopsy later week.    He is overall feeling much better. Liver enzymes have normalized. Energy is good. Appetite is  good. He has resumed normal activities. Has felt back to himself for 3 months.   He had a surveillance colonoscopy for a history of colon polyps 05/14/20 that revealed 5 colon polyps.  No family history of colon cancer or polyps.   Abdominal ultrasound with elastography 05/21/20: Diffuse hepatocellular disease. Cholelithiasis.  Elastography kPa 6.4  Most recent labs from 06/20/2020 showed normal liver enzymes with an AST of 33, ALT 33, alk phos 87, total bilirubin 0.6, albumin 4.3.  Hemoglobin is 12.9, WBC 3.8, MCV 101.5, RDW 13.3, platelets remain low at 83.  Enjoys drinking a glass of wine.   Past Medical History:  Diagnosis Date  . Adenomatous polyps   . Allergy    seasonal  . Arthritis   . ED (erectile dysfunction)   . Glaucoma 03/2020   no tx as of yet  . Hepatitis A   . Hyperlipidemia   . Hypothyroidism   . Non-traumatic rhabdomyolysis 02/21/2019  . Varicocele 02/01/2011   Left testicle    Past Surgical History:  Procedure Laterality Date  . COLONOSCOPY  2014  . EYE SURGERY Right 1956  . EYE SURGERY Left 1963  . LIVER BIOPSY    . POLYPECTOMY    . TONSILLECTOMY      Current Outpatient Medications  Medication Sig Dispense Refill  . acetaminophen (TYLENOL) 325 MG tablet Take 650 mg by mouth as needed.    . Cholecalciferol (VITAMIN D3) 5000 units TABS Take 1 tablet by mouth daily.     . cyclobenzaprine (FLEXERIL) 10 MG tablet Take 1 tablet (10 mg total) by mouth as needed. 30 tablet 2  . folic  acid (FOLVITE) 1 MG tablet Take 1 tablet (1 mg total) by mouth daily. 100 tablet 3  . Glucosamine HCl 1000 MG TABS Take 2 tablets by mouth daily.    Marland Kitchen levothyroxine (SYNTHROID) 137 MCG tablet Take 1 tablet (137 mcg total) by mouth daily before breakfast. 90 tablet 3  . levothyroxine (SYNTHROID) 150 MCG tablet Take 1 tablet (150 mcg total) by mouth daily. Take 1 tablet (150 mcg) by mouth Monday, Wednesday, and Friday only 90 tablet 3  . loratadine (CLARITIN) 10 MG tablet Take 10 mg  by mouth daily as needed for allergies.    . Multiple Vitamin (MULTIVITAMIN) capsule Take 1 capsule by mouth daily.     . Omega-3 1000 MG CAPS Take 1 tablet by mouth daily.    . rosuvastatin (CRESTOR) 10 MG tablet Take 1 tablet (10 mg total) by mouth daily. 90 tablet 3  . sildenafil (REVATIO) 20 MG tablet Use 5 tablets PO as needed 100 tablet 3   No current facility-administered medications for this visit.    Allergies as of 07/07/2020  . (No Known Allergies)    Family History  Problem Relation Age of Onset  . Alzheimer's disease Mother   . Arthritis Mother   . Dementia Mother   . Stroke Mother   . Heart disease Father   . Cancer Sister        uterine  . Hyperlipidemia Brother   . Stroke Brother   . Cancer Brother   . Colon polyps Brother   . Heart disease Paternal Grandmother   . Colon cancer Neg Hx   . Esophageal cancer Neg Hx   . Rectal cancer Neg Hx   . Stomach cancer Neg Hx     Social History   Socioeconomic History  . Marital status: Married    Spouse name: Not on file  . Number of children: Not on file  . Years of education: Not on file  . Highest education level: Not on file  Occupational History  . Not on file  Tobacco Use  . Smoking status: Former Smoker    Years: 10.00    Types: Cigarettes  . Smokeless tobacco: Never Used  . Tobacco comment: quit 1975  Vaping Use  . Vaping Use: Never used  Substance and Sexual Activity  . Alcohol use: Yes    Alcohol/week: 6.0 standard drinks    Types: 6 Cans of beer per week    Comment: stopped January 2020, 5/21 pt states resumed  . Drug use: No  . Sexual activity: Yes  Other Topics Concern  . Not on file  Social History Narrative  . Not on file   Social Determinants of Health   Financial Resource Strain:   . Difficulty of Paying Living Expenses:   Food Insecurity:   . Worried About Charity fundraiser in the Last Year:   . Arboriculturist in the Last Year:   Transportation Needs:   . Lexicographer (Medical):   Marland Kitchen Lack of Transportation (Non-Medical):   Physical Activity:   . Days of Exercise per Week:   . Minutes of Exercise per Session:   Stress:   . Feeling of Stress :   Social Connections:   . Frequency of Communication with Friends and Family:   . Frequency of Social Gatherings with Friends and Family:   . Attends Religious Services:   . Active Member of Clubs or Organizations:   . Attends Archivist Meetings:   .  Marital Status:   Intimate Partner Violence:   . Fear of Current or Ex-Partner:   . Emotionally Abused:   Marland Kitchen Physically Abused:   . Sexually Abused:    Filed Weights   07/07/20 1123  Weight: 236 lb (107 kg)    Physical Exam: Vital signs were reviewed. General:   Alert, well-nourished, pleasant and cooperative in NAD.  No longer jaundice.  Bilateral  Temporal wasting noted.  Head:  Normocephalic and atraumatic. Eyes:  Scleral icterus present.   Conjunctiva pink. Mouth:  No deformity or lesions.   Neck:  Supple; no thyromegaly. Lungs:  Clear throughout to auscultation.   No wheezes. Heart:  Regular rate and rhythm; no murmurs Abdomen:  Soft, nontender, normal bowel sounds. No rebound or guarding. Negative Murphy's sign. Liver edge remains nontender and palpable 3 fingerbreaths below the costal margin in the midclavicular line. Spleen tip not palpable.  LAD: No inguinal or umbilical LAD Rectal:  Deferred  Msk:  Symmetrical without gross deformities. Extremities:  No edema.  Neurologic:  Alert and  oriented x4;  Grossly nonfocal.  Skin:  No rash or bruise. No spider angiomas or palmar erythema. Psych:  Alert and cooperative, frustrated. Normal mood and affect.   Viyan Rosamond L. Tarri Glenn, MD, MPH [facility] 07/07/2020, 11:50 AM

## 2020-07-09 ENCOUNTER — Encounter: Payer: Self-pay | Admitting: Gastroenterology

## 2020-08-25 ENCOUNTER — Other Ambulatory Visit: Payer: Self-pay

## 2020-08-25 ENCOUNTER — Other Ambulatory Visit: Payer: Medicare Other

## 2020-08-25 DIAGNOSIS — E039 Hypothyroidism, unspecified: Secondary | ICD-10-CM | POA: Diagnosis not present

## 2020-08-25 DIAGNOSIS — E782 Mixed hyperlipidemia: Secondary | ICD-10-CM | POA: Diagnosis not present

## 2020-08-26 LAB — HEPATIC FUNCTION PANEL
AG Ratio: 1.5 (calc) (ref 1.0–2.5)
ALT: 35 U/L (ref 9–46)
AST: 34 U/L (ref 10–35)
Albumin: 4.4 g/dL (ref 3.6–5.1)
Alkaline phosphatase (APISO): 97 U/L (ref 35–144)
Bilirubin, Direct: 0.1 mg/dL (ref 0.0–0.2)
Globulin: 3 g/dL (calc) (ref 1.9–3.7)
Indirect Bilirubin: 0.6 mg/dL (calc) (ref 0.2–1.2)
Total Bilirubin: 0.7 mg/dL (ref 0.2–1.2)
Total Protein: 7.4 g/dL (ref 6.1–8.1)

## 2020-08-26 LAB — LIPID PANEL
Cholesterol: 158 mg/dL (ref <200)
HDL: 59 mg/dL (ref >=40)
LDL Cholesterol (Calc): 82 mg/dL (calc)
Non-HDL Cholesterol (Calc): 99 mg/dL (calc) (ref <130)
Total CHOL/HDL Ratio: 2.7 (calc) (ref <5.0)
Triglycerides: 88 mg/dL (ref <150)

## 2020-08-26 LAB — TSH: TSH: 2.55 mIU/L (ref 0.40–4.50)

## 2020-09-25 ENCOUNTER — Ambulatory Visit (INDEPENDENT_AMBULATORY_CARE_PROVIDER_SITE_OTHER): Payer: Medicare Other

## 2020-09-25 DIAGNOSIS — Z0000 Encounter for general adult medical examination without abnormal findings: Secondary | ICD-10-CM

## 2020-09-25 NOTE — Progress Notes (Signed)
Virtual Visit via Telephone Note  I connected with  Timothy Grant on 09/25/20 at 11:00 AM EDT by telephone and verified that I am speaking with the correct person using two identifiers.  Medicare Annual Wellness visit completed telephonically due to Covid-19 pandemic.   Persons participating in this call: This Health Coach and this patient.   Location: Patient: Home Provider: Office   I discussed the limitations, risks, security and privacy concerns of performing an evaluation and management service by telephone and the availability of in person appointments. The patient expressed understanding and agreed to proceed.  Unable to perform video visit due to video visit attempted and failed and/or patient does not have video capability.   Some vital signs may be absent or patient reported.   Willette Brace, LPN    Subjective:   Timothy Grant is a 74 y.o. male who presents for Medicare Annual/Subsequent preventive examination.  Review of Systems     Cardiac Risk Factors include: dyslipidemia;male gender;obesity (BMI >30kg/m2)     Objective:    There were no vitals filed for this visit. There is no height or weight on file to calculate BMI.  Advanced Directives 09/25/2020 01/15/2020 08/22/2019 07/10/2019 03/22/2018  Does Patient Have a Medical Advance Directive? Yes Yes Yes Yes Yes  Type of Paramedic of [place];Living will [place];Living will Living will;Healthcare Power of Attorney - Living will;Healthcare Power of Attorney  Does patient want to make changes to medical advance directive? - No - Patient declined No - Patient declined - -  Copy of [place] in Chart? No - copy requested No - copy requested No - copy requested - No - copy requested    Current Medications (verified) Outpatient Encounter Medications as of 09/25/2020  Medication Sig  . acetaminophen (TYLENOL) 325 MG tablet Take 650 mg by mouth as  needed.  . Cholecalciferol (VITAMIN D3) 5000 units TABS Take 1 tablet by mouth daily.   . cyclobenzaprine (FLEXERIL) 10 MG tablet Take 1 tablet (10 mg total) by mouth as needed.  . folic acid (FOLVITE) 1 MG tablet Take 1 tablet (1 mg total) by mouth daily.  . Glucosamine HCl 1000 MG TABS Take 2 tablets by mouth daily.  Marland Kitchen levothyroxine (SYNTHROID) 137 MCG tablet Take 1 tablet (137 mcg total) by mouth daily before breakfast.  . levothyroxine (SYNTHROID) 150 MCG tablet Take 1 tablet (150 mcg total) by mouth daily. Take 1 tablet (150 mcg) by mouth Monday, Wednesday, and Friday only  . loratadine (CLARITIN) 10 MG tablet Take 10 mg by mouth daily as needed for allergies.  . Multiple Vitamin (MULTIVITAMIN) capsule Take 1 capsule by mouth daily.   . Omega-3 1000 MG CAPS Take 1 tablet by mouth daily.  . rosuvastatin (CRESTOR) 10 MG tablet Take 1 tablet (10 mg total) by mouth daily.  . sildenafil (REVATIO) 20 MG tablet Use 5 tablets PO as needed   No facility-administered encounter medications on file as of 09/25/2020.    Allergies (verified) Patient has no known allergies.   History: Past Medical History:  Diagnosis Date  . Adenomatous polyps   . Allergy    seasonal  . Arthritis   . ED (erectile dysfunction)   . Glaucoma 03/2020   no tx as of yet  . Hepatitis A   . Hyperlipidemia   . Hypothyroidism   . Non-traumatic rhabdomyolysis 02/21/2019  . Varicocele 02/01/2011   Left testicle   Past Surgical History:  Procedure Laterality  Date  . COLONOSCOPY  2014  . EYE SURGERY Right 1956  . EYE SURGERY Left 1963  . LIVER BIOPSY    . POLYPECTOMY    . TONSILLECTOMY     Family History  Problem Relation Age of Onset  . Alzheimer's disease Mother   . Arthritis Mother   . Dementia Mother   . Stroke Mother   . Heart disease Father   . Cancer Sister        uterine  . Hyperlipidemia Brother   . Stroke Brother   . Cancer Brother   . Colon polyps Brother   . Heart disease Paternal  Grandmother   . Colon cancer Neg Hx   . Esophageal cancer Neg Hx   . Rectal cancer Neg Hx   . Stomach cancer Neg Hx    Social History   Socioeconomic History  . Marital status: Married    Spouse name: Not on file  . Number of children: Not on file  . Years of education: Not on file  . Highest education level: Not on file  Occupational History  . Occupation: retired  Tobacco Use  . Smoking status: Former Smoker    Years: 10.00    Types: Cigarettes  . Smokeless tobacco: Never Used  . Tobacco comment: quit 1975  Vaping Use  . Vaping Use: Never used  Substance and Sexual Activity  . Alcohol use: Yes    Alcohol/week: 6.0 standard drinks    Types: 6 Cans of beer per week    Comment: stopped January 2020, 5/21 pt states resumed  . Drug use: No  . Sexual activity: Yes  Other Topics Concern  . Not on file  Social History Narrative  . Not on file   Social Determinants of Health   Financial Resource Strain: Low Risk   . Difficulty of Paying Living Expenses: Not hard at all  Food Insecurity: No Food Insecurity  . Worried About Charity fundraiser in the Last Year: Never true  . Ran Out of Food in the Last Year: Never true  Transportation Needs: No Transportation Needs  . Lack of Transportation (Medical): No  . Lack of Transportation (Non-Medical): No  Physical Activity: Sufficiently Active  . Days of Exercise per Week: 7 days  . Minutes of Exercise per Session: 40 min  Stress: No Stress Concern Present  . Feeling of Stress : Not at all  Social Connections: Moderately Integrated  . Frequency of Communication with Friends and Family: Twice a week  . Frequency of Social Gatherings with Friends and Family: More than three times a week  . Attends Religious Services: 1 to 4 times per year  . Active Member of Clubs or Organizations: No  . Attends Archivist Meetings: Never  . Marital Status: Married    Tobacco Counseling Counseling given: Not Answered Comment:  quit 1975   Clinical Intake:  Pre-visit preparation completed: Yes  Pain : No/denies pain     BMI - recorded: 31.14 Nutritional Status: BMI > 30  Obese Nutritional Risks: None Diabetes: No  How often do you need to have someone help you when you read instructions, pamphlets, or other written materials from your doctor or pharmacy?: 1 - Never  Diabetic?No  Interpreter Needed?: No  Information entered by :: Charlott Rakes ,LPN   Activities of Daily Living In your present state of health, do you have any difficulty performing the following activities: 09/25/2020  Hearing? N  Vision? N  Difficulty concentrating or  making decisions? N  Walking or climbing stairs? N  Dressing or bathing? N  Doing errands, shopping? N  Preparing Food and eating ? N  Using the Toilet? N  In the past six months, have you accidently leaked urine? N  Do you have problems with loss of bowel control? N  Managing your Medications? N  Managing your Finances? N  Housekeeping or managing your Housekeeping? N  Some recent data might be hidden    Patient Care Team: [name], MD as PCP - General (Family Medicine) Stephannie Li, [place] (Ophthalmology) Daryll Brod, MD as Consulting Physician (Orthopedic Surgery) [place] (Dentistry) Juanita Craver, MD as Consulting Physician (Gastroenterology) Thornton Park, MD as Consulting Physician (Gastroenterology) Tish Men, MD (Inactive) as Consulting Physician (Hematology)  Indicate any recent Medical Services you may have received from other than [facility] providers in the past year (date may be approximate).     Assessment:   This is a routine wellness examination for Timothy Grant.  Hearing/Vision screen  Hearing Screening   125Hz  250Hz  500Hz  1000Hz  2000Hz  3000Hz  4000Hz  6000Hz  8000Hz   Right ear:           Left ear:           Comments: Pt denies any difficulty hearing at this time  Vision Screening Comments: Pt follows up annually for eye exams    Dietary issues and exercise activities discussed: Current Exercise Habits: Home exercise routine, Type of exercise: walking (playing golf), Time (Minutes): 40, Frequency (Times/Week): 7, Weekly Exercise (Minutes/Week): 280  Goals    . Patient Stated     Not at this time    . Weight (lb) < 240 lb (108.9 kg)     Lose 15-20 lbs by increasing activity and decrease caloric intake.       Depression Screen PHQ 2/9 Scores 09/25/2020 08/22/2019 03/22/2018 11/08/2017  PHQ - 2 Score 0 0 0 0    Fall Risk Fall Risk  09/25/2020 08/22/2019 03/22/2018 11/08/2017  Falls in the past year? 0 0 No No  Number falls in past yr: 0 0 - -  Injury with Fall? 0 0 - -  Risk for fall due to : Impaired vision - - -  Follow up Falls prevention discussed Education provided;Falls evaluation completed - -    Any stairs in or around the home? Yes  If so, are there any without handrails? No  Home free of loose throw rugs in walkways, pet beds, electrical cords, etc? Yes  Adequate lighting in your home to reduce risk of falls? Yes   ASSISTIVE DEVICES UTILIZED TO PREVENT FALLS:  Life alert? No  Use of a cane, walker or w/c? No  Grab bars in the bathroom? No  Shower chair or bench in shower? Yes  Elevated toilet seat or a handicapped toilet? No   TIMED UP AND GO:  Was the test performed? No .      Cognitive Function: MMSE - Mini Mental State Exam 03/22/2018  Orientation to time 5  Orientation to Place 5  Registration 3  Attention/ Calculation 5  Recall 3  Language- name 2 objects 2  Language- repeat 1  Language- follow 3 step command 3  Language- read & follow direction 1  Write a sentence 1  Copy design 1  Total score 30     6CIT Screen 09/25/2020 08/22/2019  What Year? 0 points 0 points  What month? 0 points 0 points  What time? - 0 points  Count back from 20 0  points 0 points  Months in reverse 0 points 0 points  Repeat phrase 0 points 0 points  Total Score - 0     Immunizations Immunization History  Administered Date(s) Administered  . Fluad Quad(high Dose 65+) 08/14/2019  . Hepatitis B, ped/adol 08/06/2009  . Influenza Split 08/07/2011, 08/28/2013  . Influenza, High Dose Seasonal PF 09/26/2017  . Influenza, Quadrivalent, Recombinant, Inj, Pf 09/06/2016  . Influenza, Seasonal, Injecte, Preservative Fre 09/04/2014, 09/09/2015  . Influenza,trivalent, recombinat, inj, PF 08/09/2012  . Influenza-Unspecified 09/22/2018, 09/19/2020  . Moderna SARS-COVID-2 Vaccination 03/06/2020, 04/03/2020  . Pneumococcal Conjugate-13 11/04/2014  . Pneumococcal Polysaccharide-23 02/05/2011  . Tdap 02/05/2011  . Zoster 02/05/2011    TDAP status: Up to date Flu Vaccine status: Up to date Pneumococcal vaccine status: Up to date Covid-19 vaccine status: Completed vaccines  Qualifies for Shingles Vaccine? Yes   Zostavax completed Yes   Shingrix Completed?: No.    Education has been provided regarding the importance of this vaccine. Patient has been advised to call insurance company to determine out of pocket expense if they have not yet received this vaccine. Advised may also receive vaccine at local pharmacy or Health Dept. Verbalized acceptance and understanding.  Screening Tests Health Maintenance  Topic Date Due  . TETANUS/TDAP  02/04/2021  . COLONOSCOPY  05/15/2023  . INFLUENZA VACCINE  Completed  . COVID-19 Vaccine  Completed  . Hepatitis C Screening  Completed  . PNA vac Low Risk Adult  Completed    Health Maintenance  There are no preventive care reminders to display for this patient.  Colorectal cancer screening: Completed 05/14/20. Repeat every 3 years   Additional Screening:  Hepatitis C Screening:  Completed 12/25/18  Vision Screening: Recommended annual ophthalmology exams for early detection of glaucoma and other disorders of the eye. Is the patient up to date with their annual eye exam?  Yes  Who is the provider or what is the name of  the office in which the patient attends annual eye exams? Dr Stephannie Li   Dental Screening: Recommended annual dental exams for proper oral hygiene  Community Resource Referral / Chronic Care Management: CRR required this visit?  No   CCM required this visit?  No      Plan:     I have personally reviewed and noted the following in the patient's chart:   . Medical and social history . Use of alcohol, tobacco or illicit drugs  . Current medications and supplements . Functional ability and status . Nutritional status . Physical activity . Advanced directives . List of other physicians . Hospitalizations, surgeries, and ER visits in previous 12 months . Vitals . Screenings to include cognitive, depression, and falls . Referrals and appointments  In addition, I have reviewed and discussed with patient certain preventive protocols, quality metrics, and best practice recommendations. A written personalized care plan for preventive services as well as general preventive health recommendations were provided to patient.     Willette Brace, LPN   [id number]   Nurse Notes: None

## 2020-09-25 NOTE — Patient Instructions (Addendum)
[name] , Thank you for taking time to come for your Medicare Wellness Visit. I appreciate your ongoing commitment to your health goals. Please review the following plan we discussed and let me know if I can assist you in the future.   Screening recommendations/referrals: Colonoscopy: Done 05/14/20 Recommended yearly ophthalmology/optometry visit for glaucoma screening and checkup Recommended yearly dental visit for hygiene and checkup  Vaccinations: Influenza vaccine: Done 09/19/20 Pneumococcal vaccine: Up to date Tdap vaccine: Up to date Shingles vaccine: Shingrix discussed. Please contact your pharmacy for coverage information.    Covid-19: Completed 4/1 & 04/03/20  Advanced directives: Please bring a copy of your health care power of attorney and living will to the office at your convenience.  Conditions/risks identified: None at this time  Next appointment: Follow up in one year for your annual wellness visit.   Preventive Care 75 Years and Older, Male Preventive care refers to lifestyle choices and visits with your health care provider that can promote health and wellness. What does preventive care include?  A yearly physical exam. This is also called an annual well check.  Dental exams once or twice a year.  Routine eye exams. Ask your health care provider how often you should have your eyes checked.  Personal lifestyle choices, including:  Daily care of your teeth and gums.  Regular physical activity.  Eating a healthy diet.  Avoiding tobacco and drug use.  Limiting alcohol use.  Practicing safe sex.  Taking low doses of aspirin every day.  Taking vitamin and mineral supplements as recommended by your health care provider. What happens during an annual well check? The services and screenings done by your health care provider during your annual well check will depend on your age, overall health, lifestyle risk factors, and family history of  disease. Counseling  Your health care provider may ask you questions about your:  Alcohol use.  Tobacco use.  Drug use.  Emotional well-being.  Home and relationship well-being.  Sexual activity.  Eating habits.  History of falls.  Memory and ability to understand (cognition).  Work and work Statistician. Screening  You may have the following tests or measurements:  Height, weight, and BMI.  Blood pressure.  Lipid and cholesterol levels. These may be checked every 5 years, or more frequently if you are over 50 years old.  Skin check.  Lung cancer screening. You may have this screening every year starting at age 41 if you have a 30-pack-year history of smoking and currently smoke or have quit within the past 15 years.  Fecal occult blood test (FOBT) of the stool. You may have this test every year starting at age 76.  Flexible sigmoidoscopy or colonoscopy. You may have a sigmoidoscopy every 5 years or a colonoscopy every 10 years starting at age 39.  Prostate cancer screening. Recommendations will vary depending on your family history and other risks.  Hepatitis C blood test.  Hepatitis B blood test.  Sexually transmitted disease (STD) testing.  Diabetes screening. This is done by checking your blood sugar (glucose) after you have not eaten for a while (fasting). You may have this done every 1-3 years.  Abdominal aortic aneurysm (AAA) screening. You may need this if you are a current or former smoker.  Osteoporosis. You may be screened starting at age 31 if you are at high risk. Talk with your health care provider about your test results, treatment options, and if necessary, the need for more tests. Vaccines  Your health  care provider may recommend certain vaccines, such as:  Influenza vaccine. This is recommended every year.  Tetanus, diphtheria, and acellular pertussis (Tdap, Td) vaccine. You may need a Td booster every 10 years.  Zoster vaccine. You may  need this after age 8.  Pneumococcal 13-valent conjugate (PCV13) vaccine. One dose is recommended after age 66.  Pneumococcal polysaccharide (PPSV23) vaccine. One dose is recommended after age 40. Talk to your health care provider about which screenings and vaccines you need and how often you need them. This information is not intended to replace advice given to you by your health care provider. Make sure you discuss any questions you have with your health care provider. Document Released: 12/19/2015 Document Revised: 08/11/2016 Document Reviewed: 09/23/2015 Elsevier Interactive Patient Education  2017 [place] Prevention in the Home Falls can cause injuries. They can happen to people of all ages. There are many things you can do to make your home safe and to help prevent falls. What can I do on the outside of my home?  Regularly fix the edges of walkways and driveways and fix any cracks.  Remove anything that might make you trip as you walk through a door, such as a raised step or threshold.  Trim any bushes or trees on the path to your home.  Use bright outdoor lighting.  Clear any walking paths of anything that might make someone trip, such as rocks or tools.  Regularly check to see if handrails are loose or broken. Make sure that both sides of any steps have handrails.  Any raised decks and porches should have guardrails on the edges.  Have any leaves, snow, or ice cleared regularly.  Use sand or salt on walking paths during winter.  Clean up any spills in your garage right away. This includes oil or grease spills. What can I do in the bathroom?  Use night lights.  Install grab bars by the toilet and in the tub and shower. Do not use towel bars as grab bars.  Use non-skid mats or decals in the tub or shower.  If you need to sit down in the shower, use a plastic, non-slip stool.  Keep the floor dry. Clean up any water that spills on the floor as soon as it  happens.  Remove soap buildup in the tub or shower regularly.  Attach bath mats securely with double-sided non-slip rug tape.  Do not have throw rugs and other things on the floor that can make you trip. What can I do in the bedroom?  Use night lights.  Make sure that you have a light by your bed that is easy to reach.  Do not use any sheets or blankets that are too big for your bed. They should not hang down onto the floor.  Have a firm chair that has side arms. You can use this for support while you get dressed.  Do not have throw rugs and other things on the floor that can make you trip. What can I do in the kitchen?  Clean up any spills right away.  Avoid walking on wet floors.  Keep items that you use a lot in easy-to-reach places.  If you need to reach something above you, use a strong step stool that has a grab bar.  Keep electrical cords out of the way.  Do not use floor polish or wax that makes floors slippery. If you must use wax, use non-skid floor wax.  Do not have  throw rugs and other things on the floor that can make you trip. What can I do with my stairs?  Do not leave any items on the stairs.  Make sure that there are handrails on both sides of the stairs and use them. Fix handrails that are broken or loose. Make sure that handrails are as long as the stairways.  Check any carpeting to make sure that it is firmly attached to the stairs. Fix any carpet that is loose or worn.  Avoid having throw rugs at the top or bottom of the stairs. If you do have throw rugs, attach them to the floor with carpet tape.  Make sure that you have a light switch at the top of the stairs and the bottom of the stairs. If you do not have them, ask someone to add them for you. What else can I do to help prevent falls?  Wear shoes that:  Do not have high heels.  Have rubber bottoms.  Are comfortable and fit you well.  Are closed at the toe. Do not wear sandals.  If you  use a stepladder:  Make sure that it is fully opened. Do not climb a closed stepladder.  Make sure that both sides of the stepladder are locked into place.  Ask someone to hold it for you, if possible.  Clearly mark and make sure that you can see:  Any grab bars or handrails.  First and last steps.  Where the edge of each step is.  Use tools that help you move around (mobility aids) if they are needed. These include:  Canes.  Walkers.  Scooters.  Crutches.  Turn on the lights when you go into a dark area. Replace any light bulbs as soon as they burn out.  Set up your furniture so you have a clear path. Avoid moving your furniture around.  If any of your floors are uneven, fix them.  If there are any pets around you, be aware of where they are.  Review your medicines with your doctor. Some medicines can make you feel dizzy. This can increase your chance of falling. Ask your doctor what other things that you can do to help prevent falls. This information is not intended to replace advice given to you by your health care provider. Make sure you discuss any questions you have with your health care provider. Document Released: 09/18/2009 Document Revised: 04/29/2016 Document Reviewed: 12/27/2014 Elsevier Interactive Patient Education  2017 [employer].

## 2020-09-30 ENCOUNTER — Ambulatory Visit: Payer: Medicare Other | Attending: Internal Medicine

## 2020-09-30 DIAGNOSIS — Z23 Encounter for immunization: Secondary | ICD-10-CM

## 2020-09-30 NOTE — Progress Notes (Signed)
   Covid-19 Vaccination Clinic  Name:  Timothy Grant    MRN: [id number] DOB: 07/07/1946  09/30/2020  [name] was observed post Covid-19 immunization for 15 minutes without incident. He was provided with Vaccine Information Sheet and instruction to access the V-Safe system.   [name] was instructed to call 911 with any severe reactions post vaccine: Marland Kitchen Difficulty breathing  . Swelling of face and throat  . A fast heartbeat  . A bad rash all over body  . Dizziness and weakness

## 2020-11-06 ENCOUNTER — Ambulatory Visit (HOSPITAL_COMMUNITY)
Admission: RE | Admit: 2020-11-06 | Discharge: 2020-11-06 | Disposition: A | Discharge status: Home / Self Care | Payer: Medicare Other | Source: Ambulatory Visit | Attending: Gastroenterology | Admitting: Gastroenterology

## 2020-11-06 DIAGNOSIS — K769 Liver disease, unspecified: Secondary | ICD-10-CM

## 2020-11-06 DIAGNOSIS — D696 Thrombocytopenia, unspecified: Secondary | ICD-10-CM

## 2020-11-07 ENCOUNTER — Ambulatory Visit (HOSPITAL_COMMUNITY)
Admission: RE | Admit: 2020-11-07 | Discharge: 2020-11-07 | Disposition: A | Discharge status: Home / Self Care | Payer: Medicare Other | Source: Ambulatory Visit | Attending: Gastroenterology | Admitting: Gastroenterology

## 2020-11-07 ENCOUNTER — Other Ambulatory Visit: Payer: Self-pay

## 2020-11-07 DIAGNOSIS — K769 Liver disease, unspecified: Secondary | ICD-10-CM | POA: Insufficient documentation

## 2020-11-07 DIAGNOSIS — D696 Thrombocytopenia, unspecified: Secondary | ICD-10-CM | POA: Diagnosis not present

## 2020-11-07 DIAGNOSIS — K824 Cholesterolosis of gallbladder: Secondary | ICD-10-CM | POA: Diagnosis not present

## 2020-11-10 ENCOUNTER — Telehealth: Payer: Self-pay | Admitting: Gastroenterology

## 2020-11-10 NOTE — Telephone Encounter (Signed)
Timothy Grant, I previously reviewed these records and sent you a MyChart result note about them. Please see that note so that we don't have too many information threads going at the same time. Thanks.

## 2020-11-10 NOTE — Telephone Encounter (Signed)
Diane from [facility] is requesting a call back for a call report she has on the pt.  CB [phone]

## 2020-11-10 NOTE — Telephone Encounter (Signed)
Received call report on pts [place] report, see below:  IMPRESSION: 1. At least 3 polyps are seen in the gallbladder with the largest measuring 10 mm. Recommend surgical consultation. By protocol, gallbladder polyps greater than or equal to 10 mm should be assessed by a surgeon. 2. Increased echogenicity in the liver with no focal mass, nonspecific but often seen with hepatic steatosis. 3. No other abnormalities.

## 2020-11-10 NOTE — Telephone Encounter (Signed)
See result note.  

## 2020-11-11 NOTE — Telephone Encounter (Signed)
Referral faxed to [facility] 

## 2020-11-11 NOTE — Telephone Encounter (Signed)
Patient is calling you back to follow up on previous message

## 2020-11-11 NOTE — Telephone Encounter (Signed)
All questions answered to the best of my ability. Please arrange referral to [facility]. Thanks.

## 2020-11-11 NOTE — Telephone Encounter (Signed)
Pt called back and would like to talk to Dr. Tarri Glenn about his xray result and the need to see surgeon further. He will be at dentist office from 12-1 and would not be able to answer his phone then. Requests Dr. Tarri Glenn call him at [phone]. Dr. Tarri Glenn notified.

## 2020-11-13 NOTE — Telephone Encounter (Signed)
Patient called states he spoke with [facility] and they did not receive a referral please advise

## 2020-11-13 NOTE — Telephone Encounter (Signed)
Referral was faxed to [facility] 11/11/20. Call placed to [facility], had to leave message on new pt coordinator voice mail.

## 2020-11-13 NOTE — Telephone Encounter (Signed)
Received call from [facility] stating that they are having issues with their fax machines. Referral refaxed to [phone].

## 2020-11-14 NOTE — Telephone Encounter (Signed)
Spoke with pt and let him know the [facility] referral had been refaxed. Asked pt to call back if he has not heard anything from [facility] by Tuesday. He verbalized understanding.

## 2020-11-17 ENCOUNTER — Other Ambulatory Visit: Payer: Self-pay

## 2020-11-17 ENCOUNTER — Ambulatory Visit (INDEPENDENT_AMBULATORY_CARE_PROVIDER_SITE_OTHER): Payer: Medicare Other | Admitting: Family Medicine

## 2020-11-17 ENCOUNTER — Encounter: Payer: Self-pay | Admitting: Family Medicine

## 2020-11-17 ENCOUNTER — Ambulatory Visit: Payer: Medicare Other | Admitting: Family Medicine

## 2020-11-17 VITALS — BP 118/80 | HR 60 | Temp 97.3°F | Resp 18 | Ht 73.0 in | Wt 235.8 lb

## 2020-11-17 DIAGNOSIS — E222 Syndrome of inappropriate secretion of antidiuretic hormone: Secondary | ICD-10-CM | POA: Diagnosis not present

## 2020-11-17 DIAGNOSIS — E039 Hypothyroidism, unspecified: Secondary | ICD-10-CM

## 2020-11-17 DIAGNOSIS — E782 Mixed hyperlipidemia: Secondary | ICD-10-CM | POA: Diagnosis not present

## 2020-11-17 DIAGNOSIS — D72819 Decreased white blood cell count, unspecified: Secondary | ICD-10-CM | POA: Diagnosis not present

## 2020-11-17 DIAGNOSIS — Z8619 Personal history of other infectious and parasitic diseases: Secondary | ICD-10-CM

## 2020-11-17 DIAGNOSIS — K824 Cholesterolosis of gallbladder: Secondary | ICD-10-CM | POA: Insufficient documentation

## 2020-11-17 MED ORDER — SHINGRIX 50 MCG/0.5ML IM SUSR: 0.5000 mL | Freq: Once | INTRAMUSCULAR | 0 refills | Status: AC

## 2020-11-17 NOTE — Patient Instructions (Signed)
Please return in June 2022 for your annual complete physical; please come fasting.  I will release your lab results to you on your MyChart account with further instructions. Please reply with any questions.  Please take the prescription for Shingrix to the pharmacy so they may administer the vaccinations. Your insurance will then cover the injections.   Happy Holidays! If you have any questions or concerns, please don't hesitate to send me a message via MyChart or call the office at [phone]. Thank you for visiting with [place] today! It's our pleasure caring for you.

## 2020-11-17 NOTE — Progress Notes (Signed)
Subjective  CC:  Chief Complaint  Patient presents with  . 6 month follow up    No concerns. Has a consultation in regards to is gallbladder in Jan 2022.    HPI: Timothy Grant is a 74 y.o. male who presents to the office today to address the problems listed above in the chief complaint.  74 year old here for follow-up on history of hepatitis C, SIADH, hypothyroidism and hyperlipidemia.  He was last checked 3 months ago.  Things are very stable at that time.  He remains stable.  Feels well.  Energy level is good.  Has occasional mild right upper quadrant pain.  Has consultation [facility] surgery next week for polyps of the gallbladder.  No nausea, mental status changes, lower extremity edema, or jaundice.  He is tolerating his cholesterol medications without myalgias.  He is compliant with his thyroid medications.  No symptoms of high or low thyroid   Assessment  1. History of hepatitis A   2. SIADH (syndrome of inappropriate ADH production) ([place])   3. Mixed hyperlipidemia   4. Acquired hypothyroidism   5. Leukopenia, unspecified type   6. Gallbladder polyp      Plan   History of hepatitis A: Has normalized.  We will see surgery for colon polyps.  SIADH: We will check today to ensure resolved.  He is no longer on fluid restriction and no longer using salt tablets.  Mixed hyperlipidemia: Recheck LFTs on statin.  Tolerating well.  Hypothyroidism: Clinically euthyroid  Health maintenance: Recommend shingles vaccination with Shingrix.  Prescription given  Follow up: 6 months for complete physical Visit date not found  Orders Placed This Encounter  Procedures  . TSH  . COMPLETE METABOLIC PANEL WITH GFR   Meds ordered this encounter  Medications  . Zoster Vaccine Adjuvanted [facility]) injection    Sig: Inject 0.5 mLs into the muscle once for 1 dose. Please give 2nd dose 2-6 months after first dose    Dispense:  2 each    Refill:  0      I reviewed the patients  updated PMH, FH, and SocHx.    Patient Active Problem List   Diagnosis Date Noted  . Acquired hypothyroidism 02/05/2011    Priority: High  . Adenomatous colon polyp 02/05/2011    Priority: High  . Mixed hyperlipidemia 02/05/2011    Priority: High  . Osteoarthritis of finger of left hand 01/07/2017    Priority: Medium  . Leukopenia 08/09/2012    Priority: Medium  . Thrombocytopenia ([place]) 07/09/2019    Priority: Low  . Trigger finger, left ring finger 01/07/2017    Priority: Low  . Male erectile disorder 02/09/2012    Priority: Low  . Gallbladder polyp 11/17/2020  . History of hepatitis A 05/15/2020  . SIADH (syndrome of inappropriate ADH production) ([place]) 05/15/2020   Current Meds  Medication Sig  . acetaminophen (TYLENOL) 325 MG tablet Take 650 mg by mouth as needed.  Marland Kitchen amoxicillin (AMOXIL) 500 MG capsule Take 500 mg by mouth 3 (three) times daily.  . Cholecalciferol (VITAMIN D3) 5000 units TABS Take 1 tablet by mouth daily.   . cyclobenzaprine (FLEXERIL) 10 MG tablet Take 1 tablet (10 mg total) by mouth as needed.  . folic acid (FOLVITE) 1 MG tablet Take 1 tablet (1 mg total) by mouth daily.  . Glucosamine HCl 1000 MG TABS Take 2 tablets by mouth daily.  Marland Kitchen levothyroxine (SYNTHROID) 137 MCG tablet Take 1 tablet (137 mcg total) by mouth daily  before breakfast.  . levothyroxine (SYNTHROID) 150 MCG tablet Take 1 tablet (150 mcg total) by mouth daily. Take 1 tablet (150 mcg) by mouth Monday, Wednesday, and Friday only  . loratadine (CLARITIN) 10 MG tablet Take 10 mg by mouth daily as needed for allergies.  . Multiple Vitamin (MULTIVITAMIN) capsule Take 1 capsule by mouth daily.   . Omega-3 1000 MG CAPS Take 1 tablet by mouth daily.  . rosuvastatin (CRESTOR) 10 MG tablet Take 1 tablet (10 mg total) by mouth daily.  . sildenafil (REVATIO) 20 MG tablet Use 5 tablets PO as needed    Allergies: Patient has No Known Allergies. Family History: Patient family history includes  Alzheimer's disease in his mother; Arthritis in his mother; Cancer in his brother and sister; Colon polyps in his brother; Dementia in his mother; Heart disease in his father and paternal grandmother; Hyperlipidemia in his brother; Stroke in his brother and mother. Social History:  Patient  reports that he has quit smoking. His smoking use included cigarettes. He quit after 10.00 years of use. He has never used smokeless tobacco. He reports current alcohol use of about 6.0 standard drinks of alcohol per week. He reports that he does not use drugs.  Review of Systems: Constitutional: Negative for fever malaise or anorexia Cardiovascular: negative for chest pain Respiratory: negative for SOB or persistent cough Gastrointestinal: negative for abdominal pain  Objective  Vitals: BP 118/80   Pulse 60   Temp (!) 97.3 F (36.3 C) (Temporal)   Resp 18   Ht 6\' 1"  (1.854 m)   Wt 235 lb 12.8 oz (107 kg)   SpO2 96%   BMI 31.11 kg/m  General: no acute distress , A&Ox3 HEENT: PEERL, conjunctiva normal, neck is supple Cardiovascular:  RRR without murmur or gallop.  No lower extremity edema Respiratory:  Good breath sounds bilaterally, CTAB with normal respiratory effort Skin:  Warm, no rashes, no jaundice     Commons side effects, risks, benefits, and alternatives for medications and treatment plan prescribed today were discussed, and the patient expressed understanding of the given instructions. Patient is instructed to call or message via MyChart if he/she has any questions or concerns regarding our treatment plan. No barriers to understanding were identified. We discussed Red Flag symptoms and signs in detail. Patient expressed understanding regarding what to do in case of urgent or emergency type symptoms.   Medication list was reconciled, printed and provided to the patient in AVS. Patient instructions and summary information was reviewed with the patient as documented in the AVS. This note was  prepared with assistance of Dragon voice recognition software. Occasional wrong-word or sound-a-like substitutions may have occurred due to the inherent limitations of voice recognition software  This visit occurred during the SARS-CoV-2 public health emergency.  Safety protocols were in place, including screening questions prior to the visit, additional usage of staff PPE, and extensive cleaning of exam room while observing appropriate contact time as indicated for disinfecting solutions.

## 2020-11-18 LAB — COMPLETE METABOLIC PANEL WITH GFR
AG Ratio: 1.3 (calc) (ref 1.0–2.5)
ALT: 49 U/L — ABNORMAL HIGH (ref 9–46)
AST: 53 U/L — ABNORMAL HIGH (ref 10–35)
Albumin: 4 g/dL (ref 3.6–5.1)
Alkaline phosphatase (APISO): 117 U/L (ref 35–144)
BUN: 15 mg/dL (ref 7–25)
CO2: 24 mmol/L (ref 20–32)
Calcium: 9.8 mg/dL (ref 8.6–10.3)
Chloride: 105 mmol/L (ref 98–110)
Creat: 1.02 mg/dL (ref 0.70–1.18)
GFR, Est African American: 83 mL/min/{1.73_m2} (ref >=60)
GFR, Est Non African American: 72 mL/min/{1.73_m2} (ref >=60)
Globulin: 3.2 g/dL (calc) (ref 1.9–3.7)
Glucose, Bld: 111 mg/dL — ABNORMAL HIGH (ref 65–99)
Potassium: 4.4 mmol/L (ref 3.5–5.3)
Sodium: 136 mmol/L (ref 135–146)
Total Bilirubin: 0.5 mg/dL (ref 0.2–1.2)
Total Protein: 7.2 g/dL (ref 6.1–8.1)

## 2020-11-18 LAB — TSH: TSH: 3.15 mIU/L (ref 0.40–4.50)

## 2020-11-19 DIAGNOSIS — H40011 Open angle with borderline findings, low risk, right eye: Secondary | ICD-10-CM | POA: Diagnosis not present

## 2020-11-20 ENCOUNTER — Other Ambulatory Visit: Payer: Self-pay

## 2020-11-20 DIAGNOSIS — R748 Abnormal levels of other serum enzymes: Secondary | ICD-10-CM

## 2020-12-10 DIAGNOSIS — K824 Cholesterolosis of gallbladder: Secondary | ICD-10-CM | POA: Diagnosis not present

## 2020-12-11 ENCOUNTER — Other Ambulatory Visit: Payer: Self-pay | Admitting: Gastroenterology

## 2020-12-11 DIAGNOSIS — B159 Hepatitis A without hepatic coma: Secondary | ICD-10-CM

## 2020-12-12 ENCOUNTER — Other Ambulatory Visit: Payer: Self-pay | Admitting: Gastroenterology

## 2020-12-12 DIAGNOSIS — B159 Hepatitis A without hepatic coma: Secondary | ICD-10-CM

## 2020-12-15 ENCOUNTER — Other Ambulatory Visit: Payer: Self-pay | Admitting: Gastroenterology

## 2020-12-15 DIAGNOSIS — B159 Hepatitis A without hepatic coma: Secondary | ICD-10-CM

## 2020-12-31 ENCOUNTER — Telehealth: Payer: Self-pay | Admitting: Gastroenterology

## 2020-12-31 NOTE — Telephone Encounter (Signed)
Patient requesting to speak with Dr. Tarri Glenn about some abdominal pain his been having

## 2021-01-01 NOTE — Telephone Encounter (Signed)
Pt reports for the past 5 days he has had an occasional sharp pain in his LLQ that comes and goes. He was referred to Dr. Dema Severin but Dr. Dema Severin felt he needed to see a doc that worked more with the liver. He is scheduled to see Dr. Zenia Resides next week he thinks. Pt wasn't sure if this was something he should be concerned about or if he just needs to wait until he sees the surgeon. Please advise.

## 2021-01-01 NOTE — Telephone Encounter (Signed)
Spoke with pt and he is aware. Pt scheduled to see Nicoletta Ba PA tomorrow at 11am.

## 2021-01-01 NOTE — Telephone Encounter (Signed)
He was referred to Dr. Dema Severin for gallbladder polyps. I would not expect LLQ pain to be related to the liver or the gallbladder. He did have sigmoid diverticulosis on his most recent colonoscopy. Is there an APP that could see him this afternoon or tomorrow?

## 2021-01-02 ENCOUNTER — Encounter: Payer: Self-pay | Admitting: Physician Assistant

## 2021-01-02 ENCOUNTER — Other Ambulatory Visit (INDEPENDENT_AMBULATORY_CARE_PROVIDER_SITE_OTHER): Payer: Medicare Other

## 2021-01-02 ENCOUNTER — Ambulatory Visit: Payer: Medicare Other | Admitting: Physician Assistant

## 2021-01-02 VITALS — BP 126/64 | HR 70 | Ht 73.0 in | Wt 241.0 lb

## 2021-01-02 DIAGNOSIS — K5790 Diverticulosis of intestine, part unspecified, without perforation or abscess without bleeding: Secondary | ICD-10-CM | POA: Diagnosis not present

## 2021-01-02 DIAGNOSIS — R1032 Left lower quadrant pain: Secondary | ICD-10-CM | POA: Diagnosis not present

## 2021-01-02 LAB — URINALYSIS
Bilirubin Urine: NEGATIVE
Hgb urine dipstick: NEGATIVE
Ketones, ur: NEGATIVE
Leukocytes,Ua: NEGATIVE
Nitrite: NEGATIVE
Specific Gravity, Urine: >=1.03 — AB (ref 1.000–1.030)
Total Protein, Urine: NEGATIVE
Urine Glucose: NEGATIVE
Urobilinogen, UA: 1 (ref 0.0–1.0)
pH: 5.5 (ref 5.0–8.0)

## 2021-01-02 LAB — CBC WITH DIFFERENTIAL/PLATELET
Basophils Absolute: 0 10*3/uL (ref 0.0–0.1)
Basophils Relative: 0.8 % (ref 0.0–3.0)
Eosinophils Absolute: 0.2 10*3/uL (ref 0.0–0.7)
Eosinophils Relative: 4.1 % (ref 0.0–5.0)
HCT: 39 % (ref 39.0–52.0)
Hemoglobin: 13.8 g/dL (ref 13.0–17.0)
Lymphocytes Relative: 32.4 % (ref 12.0–46.0)
Lymphs Abs: 1.4 10*3/uL (ref 0.7–4.0)
MCHC: 35.3 g/dL (ref 30.0–36.0)
MCV: 97.4 fl (ref 78.0–100.0)
Monocytes Absolute: 0.5 10*3/uL (ref 0.1–1.0)
Monocytes Relative: 10.4 % (ref 3.0–12.0)
Neutro Abs: 2.3 10*3/uL (ref 1.4–7.7)
Neutrophils Relative %: 52.3 % (ref 43.0–77.0)
Platelets: 123 10*3/uL — ABNORMAL LOW (ref 150.0–400.0)
RBC: 4.01 Mil/uL — ABNORMAL LOW (ref 4.22–5.81)
RDW: 13.5 % (ref 11.5–15.5)
WBC: 4.4 10*3/uL (ref 4.0–10.5)

## 2021-01-02 MED ORDER — AMOXICILLIN-POT CLAVULANATE 875-125 MG PO TABS: 1.0000 | ORAL_TABLET | Freq: Two times a day (BID) | ORAL | 0 refills | Status: AC

## 2021-01-02 NOTE — Patient Instructions (Addendum)
If you are age 76 or older, your body mass index should be between 23-30. Your Body mass index is 31.8 kg/m. If this is out of the aforementioned range listed, please consider follow up with your Primary Care Provider.  If you are age 2 or younger, your body mass index should be between 19-25. Your Body mass index is 31.8 kg/m. If this is out of the aformentioned range listed, please consider follow up with your Primary Care Provider.   Your provider has requested that you go to the basement level for lab work before leaving today. Press "B" on the elevator. The lab is located at the first door on the left as you exit the elevator.  START Augmentin 1 tablet twice daily for 7 days. This has been sent to your pharmacy.  Push Fluids and follow a bland diet for the next 4-5 days.  Call the office early next week if symptoms are persisting; ask for Amy's nurse, Bear.  Follow up as needed.   Thank you for entrusting me with your care and choosing [facility].  Amy Esterwood, PA-C

## 2021-01-02 NOTE — Progress Notes (Signed)
Subjective:    Patient ID: Timothy Grant, male    DOB: [birth year], 75 y.o.   MRN: [id number]  HPI Timothy Grant is a pleasant 75 year old white male, established with Dr. Tarri Glenn who comes in today with new complaint of left lower quadrant pain over the past 5 to 6 days.  He has not had diverticulitis in the past but is wondering if he may have diverticulitis. He says he has been having sharp twinges of pain intermittently but no constant ongoing pain.  Pain is located in the left mid to left lower abdomen without radiation and has not migrated.  No associated fever or chills, appetite has been fine, no changes in bowel habits no melena or hematochezia.  No history of kidney stones and denies any dysuria or hematuria.  No known injury. Colonoscopy had been done in June 2021 per Dr. Tarri Glenn with removal of 5 tubular adenomatous polyps, was also noted to have few scattered small and large mouth diverticuli scattered throughout the sigmoid left colon and ascending colon. Patient has also been diagnosed with gallbladder polyps, ultrasound was done in December 2021 showing 3 polyps in the gallbladder largest 10 mm also noted to have increased hepatic echogenicity but no definite cirrhosis.  He had been referred to surgery regarding cholecystectomy.  He says he saw a Psychologist, sport and exercise at [facility] surgery who is setting him up for follow-up with Dr. Michaelle Birks.  Surgeon expressed some concern about surgery because of his history of hepatitis A and hepatic steatosis. Patient also has a persistent thrombocytopenia and says he is to see Dr. Marin Olp in the next few weeks. He had been diagnosed with a cholestatic relapsing hepatitis A with initial episode in January 2020.  He had been seen in August 2021 per Dr. Tarri Glenn for this issue. Most recent labs December 2021 AST of 53 ALT 49, no CBC done at that time.  Platelet count had been in the 60-70,000 range.  Review of Systems Pertinent positive and negative review of  systems were noted in the above HPI section.  All other review of systems was otherwise negative.  Outpatient Encounter Medications as of 01/02/2021  Medication Sig  . acetaminophen (TYLENOL) 325 MG tablet Take 650 mg by mouth as needed.  Marland Kitchen amoxicillin-clavulanate (AUGMENTIN) 875-125 MG tablet Take 1 tablet by mouth 2 (two) times daily for 7 days.  . Cholecalciferol (VITAMIN D3) 5000 units TABS Take 1 tablet by mouth daily.   . cyclobenzaprine (FLEXERIL) 10 MG tablet Take 1 tablet (10 mg total) by mouth as needed.  . folic acid (FOLVITE) 1 MG tablet Take 1 tablet (1 mg total) by mouth daily.  . Glucosamine HCl 1000 MG TABS Take 2 tablets by mouth daily.  Marland Kitchen levothyroxine (SYNTHROID) 137 MCG tablet Take 1 tablet (137 mcg total) by mouth daily before breakfast.  . levothyroxine (SYNTHROID) 150 MCG tablet Take 1 tablet (150 mcg total) by mouth daily. Take 1 tablet (150 mcg) by mouth Monday, Wednesday, and Friday only  . loratadine (CLARITIN) 10 MG tablet Take 10 mg by mouth daily as needed for allergies.  . Multiple Vitamin (MULTIVITAMIN) capsule Take 1 capsule by mouth daily.   . Omega-3 1000 MG CAPS Take 1 tablet by mouth daily.  . rosuvastatin (CRESTOR) 10 MG tablet Take 1 tablet (10 mg total) by mouth daily.  . sildenafil (REVATIO) 20 MG tablet Use 5 tablets PO as needed  . [DISCONTINUED] amoxicillin (AMOXIL) 500 MG capsule Take 500 mg by mouth 3 (three) times daily.  No facility-administered encounter medications on file as of 01/02/2021.   No Known Allergies Patient Active Problem List   Diagnosis Date Noted  . Gallbladder polyp 11/17/2020  . History of hepatitis A 05/15/2020  . SIADH (syndrome of inappropriate ADH production) ([place]) 05/15/2020  . Thrombocytopenia ([place]) 07/09/2019  . Osteoarthritis of finger of left hand 01/07/2017  . Trigger finger, left ring finger 01/07/2017  . Leukopenia 08/09/2012  . Male erectile disorder 02/09/2012  . Acquired hypothyroidism 02/05/2011  .  Adenomatous colon polyp 02/05/2011  . Mixed hyperlipidemia 02/05/2011   Social History   Socioeconomic History  . Marital status: Married    Spouse name: Not on file  . Number of children: Not on file  . Years of education: Not on file  . Highest education level: Not on file  Occupational History  . Occupation: retired  Tobacco Use  . Smoking status: Former Smoker    Years: 10.00    Types: Cigarettes  . Smokeless tobacco: Never Used  . Tobacco comment: quit 1975  Vaping Use  . Vaping Use: Never used  Substance and Sexual Activity  . Alcohol use: Yes    Alcohol/week: 6.0 standard drinks    Types: 6 Cans of beer per week    Comment: stopped January 2020, 5/21 pt states resumed  . Drug use: No  . Sexual activity: Yes  Other Topics Concern  . Not on file  Social History Narrative  . Not on file   Social Determinants of Health   Financial Resource Strain: Low Risk   . Difficulty of Paying Living Expenses: Not hard at all  Food Insecurity: No Food Insecurity  . Worried About Charity fundraiser in the Last Year: Never true  . Ran Out of Food in the Last Year: Never true  Transportation Needs: No Transportation Needs  . Lack of Transportation (Medical): No  . Lack of Transportation (Non-Medical): No  Physical Activity: Sufficiently Active  . Days of Exercise per Week: 7 days  . Minutes of Exercise per Session: 40 min  Stress: No Stress Concern Present  . Feeling of Stress : Not at all  Social Connections: Moderately Integrated  . Frequency of Communication with Friends and Family: Twice a week  . Frequency of Social Gatherings with Friends and Family: More than three times a week  . Attends Religious Services: 1 to 4 times per year  . Active Member of Clubs or Organizations: No  . Attends Archivist Meetings: Never  . Marital Status: Married  Human resources officer Violence: Not At Risk  . Fear of Current or Ex-Partner: No  . Emotionally Abused: No  . Physically  Abused: No  . Sexually Abused: No    Mr. Mantell family history includes Alzheimer's disease in his mother; Arthritis in his mother; Cancer in his brother and sister; Colon polyps in his brother; Dementia in his mother; Heart disease in his father and paternal grandmother; Hyperlipidemia in his brother; Stroke in his brother and mother.      Objective:    Vitals:   01/02/21 1103  BP: 126/64  Pulse: 70    Physical Exam Well-developed well-nourished older WM  in no acute distress.  Height, Weight, BMI 31.8  HEENT; nontraumatic normocephalic, EOMI, PE R LA, sclera anicteric. Oropharynx; not examined Neck; supple, no JVD Cardiovascular; regular rate and rhythm with S1-S2, no murmur rub or gallop Pulmonary; Clear bilaterally Abdomen; soft, there is some mild tenderness in the left mid quadrant, no guarding or  rebound nondistended, no palpable mass or hepatosplenomegaly, bowel sounds are active Rectal;not done today Skin; benign exam, no jaundice rash or appreciable lesions Extremities; no clubbing cyanosis or edema skin warm and dry Neuro/Psych; alert and oriented x4, grossly nonfocal mood and affect appropriate       Assessment & Plan:   #81 75 year old white male with 5 to 6-day history of left mid left lower quadrant pain which has been intermittent and sharp and nonprogressive.  No associated fever, no changes in bowel habits, no dysuria or hematuria. Patient does have diverticulosis scattered throughout the colon documented on recent colonoscopy, on exam he has some mild tenderness in the left mid quadrant and suspect he may have mild diverticulitis. Consider ureterolithiasis.  #2 history of adenomatous colon polyps-up-to-date with recent colonoscopy June 2021 and indicated for 3-year interval follow-up #3 thrombocytopenia-hematology consultation pending #4.  Gallbladder polyps largest 10 mm on recent ultrasound-surgery following -needs eventual cholecystectomy #5 history  of relapsing hepatitis A-liver biopsy February 2020 with severe acute hepatitis and minimal to mild early portal periportal fibrosis. Hepatic steatosis on most recent ultrasound December 2021 without evidence for cirrhosis  Plan; CBC with differential, UA Start Augmentin 875 mg p.o. twice daily x7 days. Patient is asked to call if he has any progression of symptoms, and was also asked to call  Back next week for advice  if sxs have  not resolved - at that time would proceed with CT imaging Push fluids , and bland diet over the next 4-5 days  Erik Nessel Genia Harold PA-C 01/02/2021   Cc: [name], MD

## 2021-01-09 ENCOUNTER — Ambulatory Visit: Payer: Self-pay | Admitting: Surgery

## 2021-01-09 DIAGNOSIS — K824 Cholesterolosis of gallbladder: Secondary | ICD-10-CM | POA: Diagnosis not present

## 2021-01-09 NOTE — H&P (View-Only) (Signed)
History of Present Illness (Shelby L. Zenia Resides MD; 01/09/2021 5:49 PM) Patient words: Mr. Surgeon is a 75 yo male with a history of relapsing hepatitis A who was referred to me for evaluation of gallbladder polyps. He first had an exposure in Nov 2019 and was admitted at [facility] with abdominal pain and jaundice in Jan 2020. He had a biopsy during that workup that showed focal areas of confluent necrosis with periportal fibrosis. His LFTs ultimately improved and he is currently followed by Dr. Tarri Glenn of [facility] and undergoes surveillance imaging. He had an [place] in June 2021 that showed a 5mm gallbladder polyp. There was also coarse hepatic echotexture but no liver masses. He had a repeat US on 11/07/20 which showed 3 gallbladder polyps, the largest of which was 1cm in diameter. The patient has no abdominal pain and no symptoms of biliary colic. His LFTs have remained largely normal. His last AST/ALT on 12/13 were very mildly elevated at 53/49. Tbili was 0.5. He has had persistent thrombocytopenia, but last platelet count on 1/28 was up to 123. He has had no evidence of ascites on imaging. He has had no prior abdominal surgeries.  The patient is a 75 year old male.   Allergies Malachi Bonds, CMA; 01/09/2021 3:33 PM) No Known Drug Allergies  [12/10/2020]:  Medication History Malachi Bonds, CMA; 01/09/2021 3:33 PM) Calcium/Vitamin D/Minerals (600-200MG -UNIT Tablet, Oral) Active. Multiple Vitamin (1 (one) Oral) Active. Multiple Vitamin-Folic Acid (1 (one) Oral) Active. Levothyroxine Sodium (25MCG Tablet, Oral) Active. Crestor (10MG  Tablet, Oral) Active. Fish Oil (300MG  Capsule, Oral) Active. Glucosamine (500MG  Capsule, Oral) Active. Medications Reconciled  Vitals (Chemira Jones CMA; 01/09/2021 3:32 PM) 01/09/2021 3:32 PM Weight: 237.6 lb Height: 73in Body Surface Area: 2.32 m Body Mass Index: 31.35 kg/m        Physical Exam (Shelby L. Zenia Resides MD; 01/09/2021 5:47 PM) The physical exam  findings are as follows: Note: Constitutional: No acute distress; conversant; no deformities Neuro: alert and oriented; cranial nerves grossly in tact; no focal deficits Eyes: Moist conjunctiva; anicteric sclerae; extraocular movements in tact Neck: Trachea midline Lungs: Normal respiratory effort; lungs clear to auscultation bilaterally; symmetric chest wall expansion CV: Regular rate and rhythm; no murmurs; no pitting edema GI: Abdomen soft, nontender, nondistended; no masses or organomegaly MSK: Normal gait and station; no clubbing/cyanosis Psychiatric: Appropriate affect; alert and oriented 3    Assessment & Plan (Shelby L. Zenia Resides MD; 01/09/2021 5:55 PM) GALLBLADDER POLYP (K82.4) Story: Mr. Viglione is a very pleasant 73yoM with history of hepatitis and potentially underlying cirrhosis, referred for evaluation of gallbladder polyps. I personally reviewed his referral notes, labs, and imaging. The largest polyp is 1cm. The general recommendation is to proceed with cholecystectomy for gallbladder polyps 1cm or larger in diameter, to minimize the risk of developing a malignancy. The patient's underlying liver disease raises concerns for increased perioperative risk, however even if he has underlying cirrhosis it seems well-compensated, with no signs of ascites or portal hypertension. He would be a Child-Pugh A and is an acceptable surgical candidate. I had a discussion with the patient and his wife regarding the management options for the gallbladder polyp. If he would prefer not to undergo an invasive operation, it would also be reasonable to observe the polyp with close follow-up, i.e. repeat imaging in 3 months. However this polyp already appears to have grown in size from prior US, thus I lean towards proceeding with cholecystectomy. I will also discuss with patient's hepatologist Dr. Tarri Glenn. The patient and his  wife agree that they would like to proceed with cholecystectomy. I discussed the  details of laparoscopic cholecystectomy, including the potential need for an oncologic operation if an underlying malignancy is identified, although this is unlikely. Patient agrees to proceed with surgery. He will be contacted to schedule a surgery date.   Michaelle Birks, [facility], Hepatobiliary and Pancreatic Surgery 01/09/21 6:19 PM

## 2021-01-09 NOTE — H&P (Signed)
History of Present Illness (Timothy Germain L. Colie Josten MD; 01/09/2021 5:49 PM) Patient words: Timothy Grant is a 75 yo male with a history of relapsing hepatitis A who was referred to me for evaluation of gallbladder polyps. He first had an exposure in Nov 2019 and was admitted at [facility] with abdominal pain and jaundice in Jan 2020. He had a biopsy during that workup that showed focal areas of confluent necrosis with periportal fibrosis. His LFTs ultimately improved and he is currently followed by Dr. Beavers of [facility] and undergoes surveillance imaging. He had an US in June 2021 that showed a 6mm gallbladder polyp. There was also coarse hepatic echotexture but no liver masses. He had a repeat US on 11/07/20 which showed 3 gallbladder polyps, the largest of which was 1cm in diameter. The patient has no abdominal pain and no symptoms of biliary colic. His LFTs have remained largely normal. His last AST/ALT on 12/13 were very mildly elevated at 53/49. Tbili was 0.5. He has had persistent thrombocytopenia, but last platelet count on 1/28 was up to 123. He has had no evidence of ascites on imaging. He has had no prior abdominal surgeries.  The patient is a 75 year old male.   Allergies (Timothy Grant, CMA; 01/09/2021 3:33 PM) No Known Drug Allergies  [12/10/2020]:  Medication History (Timothy Grant, CMA; 01/09/2021 3:33 PM) Calcium/Vitamin D/Minerals (600-200MG-UNIT Tablet, Oral) Active. Multiple Vitamin (1 (one) Oral) Active. Multiple Vitamin-Folic Acid (1 (one) Oral) Active. Levothyroxine Sodium (25MCG Tablet, Oral) Active. Crestor (10MG Tablet, Oral) Active. Fish Oil (300MG Capsule, Oral) Active. Glucosamine (500MG Capsule, Oral) Active. Medications Reconciled  Vitals (Timothy Grant CMA; 01/09/2021 3:32 PM) 01/09/2021 3:32 PM Weight: 237.6 lb Height: 73in Body Surface Area: 2.32 m Body Mass Index: 31.35 kg/m        Physical Exam (Timothy Grant L. Timothy Sherwood MD; 01/09/2021 5:47 PM) The physical exam  findings are as follows: Note: Constitutional: No acute distress; conversant; no deformities Neuro: alert and oriented; cranial nerves grossly in tact; no focal deficits Eyes: Moist conjunctiva; anicteric sclerae; extraocular movements in tact Neck: Trachea midline Lungs: Normal respiratory effort; lungs clear to auscultation bilaterally; symmetric chest wall expansion CV: Regular rate and rhythm; no murmurs; no pitting edema GI: Abdomen soft, nontender, nondistended; no masses or organomegaly MSK: Normal gait and station; no clubbing/cyanosis Psychiatric: Appropriate affect; alert and oriented 3    Assessment & Plan (Timothy Grant L. Timothy Yankovich MD; 01/09/2021 5:55 PM) GALLBLADDER POLYP (K82.4) Story: [name] is a very pleasant 75yoM with history of hepatitis and potentially underlying cirrhosis, referred for evaluation of gallbladder polyps. I personally reviewed his referral notes, labs, and imaging. The largest polyp is 1cm. The general recommendation is to proceed with cholecystectomy for gallbladder polyps 1cm or larger in diameter, to minimize the risk of developing a malignancy. The patient's underlying liver disease raises concerns for increased perioperative risk, however even if he has underlying cirrhosis it seems well-compensated, with no signs of ascites or portal hypertension. He would be a Child-Pugh A and is an acceptable surgical candidate. I had a discussion with the patient and his wife regarding the management options for the gallbladder polyp. If he would prefer not to undergo an invasive operation, it would also be reasonable to observe the polyp with close follow-up, i.e. repeat imaging in 3 months. However this polyp already appears to have grown in size from prior US, thus I lean towards proceeding with cholecystectomy. I will also discuss with patient's hepatologist Dr. Beavers. The patient and his   wife agree that they would like to proceed with cholecystectomy. I discussed the  details of laparoscopic cholecystectomy, including the potential need for an oncologic operation if an underlying malignancy is identified, although this is unlikely. Patient agrees to proceed with surgery. He will be contacted to schedule a surgery date.   Timothy Grant, [facility], Hepatobiliary and Pancreatic Surgery 01/09/21 6:19 PM

## 2021-01-12 ENCOUNTER — Other Ambulatory Visit: Payer: Self-pay | Admitting: *Deleted

## 2021-01-12 DIAGNOSIS — D696 Thrombocytopenia, unspecified: Secondary | ICD-10-CM

## 2021-01-13 ENCOUNTER — Telehealth: Payer: Self-pay

## 2021-01-13 ENCOUNTER — Encounter: Payer: Self-pay | Admitting: Hematology & Oncology

## 2021-01-13 ENCOUNTER — Inpatient Hospital Stay: Payer: Medicare Other | Attending: Hematology & Oncology

## 2021-01-13 ENCOUNTER — Inpatient Hospital Stay: Payer: Medicare Other | Admitting: Hematology & Oncology

## 2021-01-13 ENCOUNTER — Other Ambulatory Visit: Payer: Self-pay

## 2021-01-13 VITALS — BP 139/86 | HR 70 | Temp 98.6°F | Resp 18 | Wt 236.8 lb

## 2021-01-13 DIAGNOSIS — Z7982 Long term (current) use of aspirin: Secondary | ICD-10-CM | POA: Diagnosis not present

## 2021-01-13 DIAGNOSIS — Z79899 Other long term (current) drug therapy: Secondary | ICD-10-CM | POA: Diagnosis not present

## 2021-01-13 DIAGNOSIS — K7460 Unspecified cirrhosis of liver: Secondary | ICD-10-CM | POA: Diagnosis not present

## 2021-01-13 DIAGNOSIS — D696 Thrombocytopenia, unspecified: Secondary | ICD-10-CM

## 2021-01-13 LAB — COMPREHENSIVE METABOLIC PANEL WITH GFR
ALT: 54 U/L — ABNORMAL HIGH (ref 0–44)
AST: 44 U/L — ABNORMAL HIGH (ref 15–41)
Albumin: 4.3 g/dL (ref 3.5–5.0)
Alkaline Phosphatase: 105 U/L (ref 38–126)
Anion gap: 7 (ref 5–15)
BUN: 24 mg/dL — ABNORMAL HIGH (ref 8–23)
CO2: 26 mmol/L (ref 22–32)
Calcium: 10.7 mg/dL — ABNORMAL HIGH (ref 8.9–10.3)
Chloride: 102 mmol/L (ref 98–111)
Creatinine, Ser: 1.19 mg/dL (ref 0.61–1.24)
GFR, Estimated: >60 mL/min (ref >60)
Glucose, Bld: 113 mg/dL — ABNORMAL HIGH (ref 70–99)
Potassium: 4.4 mmol/L (ref 3.5–5.1)
Sodium: 135 mmol/L (ref 135–145)
Total Bilirubin: 0.5 mg/dL (ref 0.3–1.2)
Total Protein: 7.2 g/dL (ref 6.5–8.1)

## 2021-01-13 LAB — CBC WITH DIFFERENTIAL (CANCER CENTER ONLY)
Abs Immature Granulocytes: 0.01 10*3/uL (ref 0.00–0.07)
Basophils Absolute: 0 10*3/uL (ref 0.0–0.1)
Basophils Relative: 1 %
Eosinophils Absolute: 0.2 10*3/uL (ref 0.0–0.5)
Eosinophils Relative: 6 %
HCT: 38 % — ABNORMAL LOW (ref 39.0–52.0)
Hemoglobin: 13.3 g/dL (ref 13.0–17.0)
Immature Granulocytes: 0 %
Lymphocytes Relative: 34 %
Lymphs Abs: 1.5 10*3/uL (ref 0.7–4.0)
MCH: 33.6 pg (ref 26.0–34.0)
MCHC: 35 g/dL (ref 30.0–36.0)
MCV: 96 fL (ref 80.0–100.0)
Monocytes Absolute: 0.5 10*3/uL (ref 0.1–1.0)
Monocytes Relative: 11 %
Neutro Abs: 2 10*3/uL (ref 1.7–7.7)
Neutrophils Relative %: 48 %
Platelet Count: 108 10*3/uL — ABNORMAL LOW (ref 150–400)
RBC: 3.96 MIL/uL — ABNORMAL LOW (ref 4.22–5.81)
RDW: 12.7 % (ref 11.5–15.5)
WBC Count: 4.2 10*3/uL (ref 4.0–10.5)
nRBC: 0 % (ref 0.0–0.2)

## 2021-01-13 NOTE — Telephone Encounter (Signed)
One year appts made per 01/13/21 los, pt to gain sch and or view mychart   Timothy Grant

## 2021-01-13 NOTE — Progress Notes (Signed)
Hematology and Oncology Follow Up Visit  Timothy Grant [id number] 03-30-1946 75 y.o. 01/13/2021   Principle Diagnosis:   Chronic thrombocytopenia-likely cirrhosis/splenomegaly  Current Therapy:    Observation     Interim History:  Timothy Grant is back for follow-up.  He is followed yearly.  He was followed by Timothy Grant.  He is a very nice 75 year old male.  He was a Software engineer at [employer].  He certainly did a lot of my prescriptions when he was working.  He has been retired for about 8 years.  He initially was found to have some pancytopenia.  He has underlying cirrhosis.  This is from hepatitis A.  Apparently this is a recurrent problem for him.  The problem now is that he probably needs to have his gallbladder taken out.  He has been seen by the surgeon for this.  He has had no bleeding.  He has had no real problems since we saw him a year ago.  He has been quite active.  He has been playing golf.  He has had no problems with Covid.  He has had no cough or shortness of breath.  He has had no obvious change in bowel or bladder habits.  Overall, I would say his performance status is ECOG 0.  Medications:  Current Outpatient Medications:  .  Aspirin 81 MG CAPS, , Disp: , Rfl:  .  Cholecalciferol (VITAMIN D3) 1.25 MG (50000 UT) TABS, , Disp: , Rfl:  .  folic acid (FOLVITE) 1 MG tablet, Take 1 tablet (1 mg total) by mouth daily., Disp: 100 tablet, Rfl: 3 .  Glucosamine-Chondroit-Vit C-Mn (GLUCOSAMINE 1500 COMPLEX PO), , Disp: , Rfl:  .  levothyroxine (SYNTHROID) 137 MCG tablet, Take 1 tablet (137 mcg total) by mouth daily before breakfast., Disp: 90 tablet, Rfl: 3 .  levothyroxine (SYNTHROID) 150 MCG tablet, Take 1 tablet (150 mcg total) by mouth daily. Take 1 tablet (150 mcg) by mouth Monday, Wednesday, and Friday only, Disp: 90 tablet, Rfl: 3 .  loratadine (CLARITIN) 10 MG tablet, Take 10 mg by mouth daily as needed for allergies., Disp: , Rfl:  .  Multiple Vitamin  (MULTIVITAMIN) capsule, Take 1 capsule by mouth daily. , Disp: , Rfl:  .  Omega-3 Fatty Acids (FISH OIL) 1200 MG CAPS, , Disp: , Rfl:  .  rosuvastatin (CRESTOR) 10 MG tablet, Take 1 tablet (10 mg total) by mouth daily., Disp: 90 tablet, Rfl: 3 .  sildenafil (REVATIO) 20 MG tablet, Use 5 tablets PO as needed, Disp: 100 tablet, Rfl: 3 .  acetaminophen (TYLENOL) 325 MG tablet, Take 650 mg by mouth as needed. (Patient not taking: Reported on 01/13/2021), Disp: , Rfl:  .  cyclobenzaprine (FLEXERIL) 10 MG tablet, Take 1 tablet (10 mg total) by mouth as needed. (Patient not taking: Reported on 01/13/2021), Disp: 30 tablet, Rfl: 2  Allergies: No Known Allergies  Past Medical History, Surgical history, Social history, and Family History were reviewed and updated.  Review of Systems: Review of Systems  Constitutional: Negative.   HENT:  Negative.   Eyes: Negative.   Respiratory: Negative.   Cardiovascular: Negative.   Gastrointestinal: Negative.   Endocrine: Negative.   Genitourinary: Negative.    Musculoskeletal: Negative.   Skin: Negative.   Neurological: Negative.   Hematological: Negative.   Psychiatric/Behavioral: Negative.     Physical Exam:  weight is 236 lb 12 oz (107.4 kg). His oral temperature is 98.6 F (37 C). His blood pressure is 139/86 and his pulse  is 70. His respiration is 18 and oxygen saturation is 97%.   Wt Readings from Last 3 Encounters:  01/13/21 236 lb 12 oz (107.4 kg)  01/02/21 241 lb (109.3 kg)  11/17/20 235 lb 12.8 oz (107 kg)    Physical Exam Vitals reviewed.  HENT:     Head: Normocephalic and atraumatic.     Mouth/Throat:     Mouth: Oropharynx is clear and moist.  Eyes:     Extraocular Movements: EOM normal.     Pupils: Pupils are equal, round, and reactive to light.  Cardiovascular:     Rate and Rhythm: Normal rate and regular rhythm.     Heart sounds: Normal heart sounds.  Pulmonary:     Effort: Pulmonary effort is normal.     Breath sounds: Normal  breath sounds.  Abdominal:     General: Bowel sounds are normal.     Palpations: Abdomen is soft.  Musculoskeletal:        General: No tenderness, deformity or edema. Normal range of motion.     Cervical back: Normal range of motion.  Lymphadenopathy:     Cervical: No cervical adenopathy.  Skin:    General: Skin is warm and dry.     Findings: No erythema or rash.  Neurological:     Mental Status: He is alert and oriented to person, place, and time.  Psychiatric:        Mood and Affect: Mood and affect normal.        Behavior: Behavior normal.        Thought Content: Thought content normal.        Judgment: Judgment normal.      Lab Results  Component Value Date   WBC 4.2 01/13/2021   HGB 13.3 01/13/2021   HCT 38.0 (L) 01/13/2021   MCV 96.0 01/13/2021   PLT 108 (L) 01/13/2021     Chemistry      Component Value Date/Time   NA 135 01/13/2021 0932   K 4.4 01/13/2021 0932   CL 102 01/13/2021 0932   CO2 26 01/13/2021 0932   BUN 24 (H) 01/13/2021 0932   CREATININE 1.19 01/13/2021 0932   CREATININE 1.02 11/17/2020 0848      Component Value Date/Time   CALCIUM 10.7 (H) 01/13/2021 0932   ALKPHOS 105 01/13/2021 0932   AST 44 (H) 01/13/2021 0932   AST 41 01/15/2020 1059   ALT 54 (H) 01/13/2021 0932   ALT 42 01/15/2020 1059   BILITOT 0.5 01/13/2021 0932   BILITOT 0.8 01/15/2020 1059      Impression and Plan: Timothy Grant is a very nice 75 year old white male.  He has mild thrombocytopenia.  His platelet count is actually little bit better today.  I looked at his blood smear.  His platelets are large and well granulated.  As such, I suspect this is probably from splenic sequestration because of some splenomegaly.  If he did not have gallbladder surgery, his platelet count is adequate for this.  I really do not see a problem with his thrombocytopenia with surgery.  I think as long as platelet count is abov 75,000, he should be okay for surgery.  His platelets are  functional.  They are large platelets and well granulated so they should provide good hemostasis.  From our standpoint, I radial think we have to get him back for another year.  He is happy being seen only yearly.  He certainly can come back sooner if he runs into problems  with bleeding or bruising.   Volanda Napoleon, MD 2/8/20221:52 PM

## 2021-01-22 NOTE — Patient Instructions (Addendum)
DUE TO COVID-19 ONLY ONE VISITOR IS ALLOWED TO COME WITH YOU AND STAY IN THE WAITING ROOM ONLY DURING PRE OP AND PROCEDURE.     COVID SWAB TESTING MUST BE COMPLETED ON:   Tuesday, 01-27-21 @ 2:55 PM   [address] [place],  [address]  (Must self quarantine after testing. Follow instructions on handout.)        Your procedure is scheduled on: Friday, 01-30-21   Report to [facility] Main  Entrance    Report to admitting at 11:00 AM   Call this number if you have problems the morning of surgery [phone]   Do not eat food :After Midnight.   May have liquids until 10:00 AM  day of surgery  CLEAR LIQUID DIET  Foods Allowed                                                                     Foods Excluded  Water, Black Coffee and tea, regular and decaf              liquids that you cannot  Plain Jell-O in any flavor  (No red)                                     see through such as: Fruit ices (not with fruit pulp)                                      milk, soups, orange juice              Iced Popsicles (No red)                                      All solid food                                   Apple juices Sports drinks like Gatorade (No red) Lightly seasoned clear broth or consume(fat free) Sugar, honey syrup      Oral Hygiene is also important to reduce your risk of infection.                                    Remember - BRUSH YOUR TEETH THE MORNING OF SURGERY WITH YOUR REGULAR TOOTHPASTE   Do NOT smoke after Midnight   Take these medicines the morning of surgery with A SIP OF WATER: Levothyroxine, Loratadine, Rosuvastatin                                You may not have any metal on your body including jewelry, and body piercings             Do not wear lotions, powders, perfumes/cologne, or deodorant  Men may shave face and neck.   Do not bring valuables to the hospital. [place].   Contacts,  dentures or bridgework may not be worn into surgery.    Patients discharged the day of surgery will not be allowed to drive home.   Special Instructions: Bring a copy of your healthcare power of attorney and living will documents         the day of surgery if you haven't  scanned them in before.              Please read over the following fact sheets you were given: IF YOU HAVE QUESTIONS ABOUT YOUR PRE OP INSTRUCTIONS PLEASE CALL  [facility] - Preparing for Surgery Before surgery, you can play an important role.  Because skin is not sterile, your skin needs to be as free of germs as possible.  You can reduce the number of germs on your skin by washing with CHG (chlorahexidine gluconate) soap before surgery.  CHG is an antiseptic cleaner which kills germs and bonds with the skin to continue killing germs even after washing. Please DO NOT use if you have an allergy to CHG or antibacterial soaps.  If your skin becomes reddened/irritated stop using the CHG and inform your nurse when you arrive at Short Stay. Do not shave (including legs and underarms) for at least 48 hours prior to the first CHG shower.  You may shave your face/neck.  Please follow these instructions carefully:  1.  Shower with CHG Soap the night before surgery and the  morning of surgery.  2.  If you choose to wash your hair, wash your hair first as usual with your normal  shampoo.  3.  After you shampoo, rinse your hair and body thoroughly to remove the shampoo.                             4.  Use CHG as you would any other liquid soap.  You can apply chg directly to the skin and wash.  Gently with a scrungie or clean washcloth.  5.  Apply the CHG Soap to your body ONLY FROM THE NECK DOWN.   Do   not use on face/ open                           Wound or open sores. Avoid contact with eyes, ears mouth and   genitals (private parts).                       Wash face,  Genitals (private parts) with your normal  soap.             6.  Wash thoroughly, paying special attention to the area where your    surgery  will be performed.  7.  Thoroughly rinse your body with warm water from the neck down.  8.  DO NOT shower/wash with your normal soap after using and rinsing off the CHG Soap.                9.  Pat yourself dry with a clean towel.            10.  Wear clean pajamas.            11.  Place clean sheets on your bed  the night of your first shower and do not  sleep with pets. Day of Surgery : Do not apply any lotions/deodorants the morning of surgery.  Please wear clean clothes to the hospital/surgery center.  FAILURE TO FOLLOW THESE INSTRUCTIONS MAY RESULT IN THE CANCELLATION OF YOUR SURGERY  PATIENT SIGNATURE_________________________________  NURSE SIGNATURE__________________________________  ________________________________________________________________________  WHAT IS A BLOOD TRANSFUSION? Blood Transfusion Information  A transfusion is the replacement of blood or some of its parts. Blood is made up of multiple cells which provide different functions.  Red blood cells carry oxygen and are used for blood loss replacement.  White blood cells fight against infection.  Platelets control bleeding.  Plasma helps clot blood.  Other blood products are available for specialized needs, such as hemophilia or other clotting disorders. BEFORE THE TRANSFUSION  Who gives blood for transfusions?   Healthy volunteers who are fully evaluated to make sure their blood is safe. This is blood bank blood. Transfusion therapy is the safest it has ever been in the practice of medicine. Before blood is taken from a donor, a complete history is taken to make sure that person has no history of diseases nor engages in risky social behavior (examples are intravenous drug use or sexual activity with multiple partners). The donor's travel history is screened to minimize risk of transmitting infections, such as  malaria. The donated blood is tested for signs of infectious diseases, such as HIV and hepatitis. The blood is then tested to be sure it is compatible with you in order to minimize the chance of a transfusion reaction. If you or a relative donates blood, this is often done in anticipation of surgery and is not appropriate for emergency situations. It takes many days to process the donated blood. RISKS AND COMPLICATIONS Although transfusion therapy is very safe and saves many lives, the main dangers of transfusion include:   Getting an infectious disease.  Developing a transfusion reaction. This is an allergic reaction to something in the blood you were given. Every precaution is taken to prevent this. The decision to have a blood transfusion has been considered carefully by your caregiver before blood is given. Blood is not given unless the benefits outweigh the risks. AFTER THE TRANSFUSION  Right after receiving a blood transfusion, you will usually feel much better and more energetic. This is especially true if your red blood cells have gotten low (anemic). The transfusion raises the level of the red blood cells which carry oxygen, and this usually causes an energy increase.  The nurse administering the transfusion will monitor you carefully for complications. HOME CARE INSTRUCTIONS  No special instructions are needed after a transfusion. You may find your energy is better. Speak with your caregiver about any limitations on activity for underlying diseases you may have. SEEK MEDICAL CARE IF:   Your condition is not improving after your transfusion.  You develop redness or irritation at the intravenous (IV) site. SEEK IMMEDIATE MEDICAL CARE IF:  Any of the following symptoms occur over the next 12 hours:  Shaking chills.  You have a temperature by mouth above 102 F (38.9 C), not controlled by medicine.  Chest, back, or muscle pain.  People around you feel you are not acting correctly  or are confused.  Shortness of breath or difficulty breathing.  Dizziness and fainting.  You get a rash or develop hives.  You have a decrease in urine output.  Your urine turns a dark color or changes to pink, red, or brown.  Any of the following symptoms occur over the next 10 days:  You have a temperature by mouth above 102 F (38.9 C), not controlled by medicine.  Shortness of breath.  Weakness after normal activity.  The white part of the eye turns yellow (jaundice).  You have a decrease in the amount of urine or are urinating less often.  Your urine turns a dark color or changes to pink, red, or brown. Document Released: 11/19/2000 Document Revised: 02/14/2012 Document Reviewed: 07/08/2008 [facility] Patient Information 2014 [place], [place].  _______________________________________________________________________

## 2021-01-22 NOTE — Progress Notes (Addendum)
COVID Vaccine Completed:  x3 Date COVID Vaccine completed:  03-06-20, 04-03-20,  Has received booster:  09-30-20 COVID vaccine manufacturer:   Moderna     Date of COVID positive in last 90 days:  N/A  PCP - Billey Chang, MD Cardiologist - N/A  Chest x-ray - N/A EKG - 01-23-21 in Epic Stress Test -  ECHO - 06-25-19 Epic Cardiac Cath -  Pacemaker/ICD device last checked:  Sleep Study - N/A CPAP -   Fasting Blood Sugar - N/A Checks Blood Sugar _____ times a day  Blood Thinner Instructions:N/A Aspirin Instructions: Last Dose:  Activity level:  Can go up a flight of stairs and activities of daily living without stopping and without symptoms.  Able to exercise without symptoms     Anesthesia review:   Cardiomegaly evaluated by echo  Patient denies shortness of breath, fever, cough and chest pain at PAT appointment   Patient verbalized understanding of instructions that were given to them at the PAT appointment. Patient was also instructed that they will need to review over the PAT instructions again at home before surgery.

## 2021-01-23 ENCOUNTER — Encounter (HOSPITAL_COMMUNITY)
Admission: RE | Admit: 2021-01-23 | Discharge: 2021-01-23 | Disposition: A | Discharge status: Home / Self Care | Payer: Medicare Other | Source: Ambulatory Visit | Attending: Surgery | Admitting: Surgery

## 2021-01-23 ENCOUNTER — Other Ambulatory Visit: Payer: Self-pay

## 2021-01-23 ENCOUNTER — Encounter (HOSPITAL_COMMUNITY): Payer: Self-pay

## 2021-01-23 DIAGNOSIS — Z01818 Encounter for other preprocedural examination: Secondary | ICD-10-CM | POA: Insufficient documentation

## 2021-01-23 HISTORY — DX: Cardiomegaly: I51.7

## 2021-01-23 HISTORY — DX: Cholesterolosis of gallbladder: K82.4

## 2021-01-23 HISTORY — DX: Thrombocytopenia, unspecified: D69.6

## 2021-01-23 LAB — COMPREHENSIVE METABOLIC PANEL
ALT: 45 U/L — ABNORMAL HIGH (ref 0–44)
AST: 41 U/L (ref 15–41)
Albumin: 4.3 g/dL (ref 3.5–5.0)
Alkaline Phosphatase: 102 U/L (ref 38–126)
Anion gap: 11 (ref 5–15)
BUN: 17 mg/dL (ref 8–23)
CO2: 23 mmol/L (ref 22–32)
Calcium: 9.6 mg/dL (ref 8.9–10.3)
Chloride: 102 mmol/L (ref 98–111)
Creatinine, Ser: 1.04 mg/dL (ref 0.61–1.24)
GFR, Estimated: >60 mL/min (ref >60)
Glucose, Bld: 109 mg/dL — ABNORMAL HIGH (ref 70–99)
Potassium: 4.5 mmol/L (ref 3.5–5.1)
Sodium: 136 mmol/L (ref 135–145)
Total Bilirubin: 0.8 mg/dL (ref 0.3–1.2)
Total Protein: 7.8 g/dL (ref 6.5–8.1)

## 2021-01-23 LAB — CBC
HCT: 41 % (ref 39.0–52.0)
Hemoglobin: 13.8 g/dL (ref 13.0–17.0)
MCH: 33.3 pg (ref 26.0–34.0)
MCHC: 33.7 g/dL (ref 30.0–36.0)
MCV: 99 fL (ref 80.0–100.0)
Platelets: UNDETERMINED 10*3/uL (ref 150–400)
RBC: 4.14 MIL/uL — ABNORMAL LOW (ref 4.22–5.81)
RDW: 13 % (ref 11.5–15.5)
WBC: 3.9 10*3/uL — ABNORMAL LOW (ref 4.0–10.5)
nRBC: 0 % (ref 0.0–0.2)

## 2021-01-26 ENCOUNTER — Encounter (HOSPITAL_COMMUNITY): Payer: Self-pay

## 2021-01-26 NOTE — Anesthesia Preprocedure Evaluation (Addendum)
Anesthesia Evaluation  Patient identified by MRN, date of birth, ID band Patient awake    Reviewed: Allergy & Precautions, NPO status , Patient's Chart, lab work & pertinent test results  History of Anesthesia Complications Negative for: history of anesthetic complications  Airway Mallampati: II  TM Distance: >3 FB Neck ROM: Full    Dental no notable dental hx. (+) Dental Advisory Given   Pulmonary neg pulmonary ROS, former smoker,    Pulmonary exam normal        Cardiovascular negative cardio ROS Normal cardiovascular exam  Echo 06/25/19:  1. The left ventricle has normal systolic function with an ejection fraction of 60-65%. The cavity size was normal. There is moderate concentric left ventricular hypertrophy. Left ventricular diastolic Doppler parameters are consistent with impaired relaxation. No evidence of left ventricular regional wall motion abnormalities.  2. The right ventricle has normal systolic function. The cavity was mildly enlarged. There is no increase in right ventricular wall thickness. Right ventricular systolic pressure is normal.  3. Left atrial size was mildly dilated.  4. No evidence of mitral valve stenosis.  5. The aortic valve is grossly normal. Mild thickening of the aortic valve. Mild calcification of the aortic valve. No stenosis of the aortic valve.  6. The aortic arch is normal in size and structure.  7. There is mild to moderate dilatation of the aortic root and of the ascending aorta measuring 43 mm.   Neuro/Psych negative neurological ROS     GI/Hepatic negative GI ROS, (+) Hepatitis -, AHx of Hep A    Endo/Other  Hypothyroidism   Renal/GU negative Renal ROS     Musculoskeletal negative musculoskeletal ROS (+)   Abdominal   Peds  Hematology negative hematology ROS (+)   Anesthesia Other Findings   Reproductive/Obstetrics                          Anesthesia Physical Anesthesia Plan  ASA: III  Anesthesia Plan: General   Post-op Pain Management:    Induction: Intravenous  PONV Risk Score and Plan: 3 and Ondansetron, Dexamethasone, Treatment may vary due to age or medical condition and Diphenhydramine  Airway Management Planned: Oral ETT  Additional Equipment:   Intra-op Plan:   Post-operative Plan: Extubation in OR  Informed Consent: I have reviewed the patients History and Physical, chart, labs and discussed the procedure including the risks, benefits and alternatives for the proposed anesthesia with the patient or authorized representative who has indicated his/her understanding and acceptance.     Dental advisory given  Plan Discussed with: Anesthesiologist and CRNA  Anesthesia Plan Comments: (See APP note by Durel Salts, FNP )      Anesthesia Quick Evaluation

## 2021-01-26 NOTE — Progress Notes (Signed)
Anesthesia Chart Review:   Case: [id number] Date/Time: 01/30/21 1245   Procedure: LAPAROSCOPIC CHOLECYSTECTOMY (N/A ) - ROOM 1 STARTING AT 01:00PM   Anesthesia type: General   Pre-op diagnosis: GALLBLADDER POLYPS   Location: [facility] ROOM 01 / [facility]   Surgeons: Dwan Bolt, MD      DISCUSSION:  Pt is a 75 year old with hx hepatitis A, chronic thrombocytopenia (likely due to splenic sequestration from splenomegaly), mild to moderate aortic root dilatation (by 06/25/19 echo), cardiomegaly (on CXR, echo results below)   Platelet count not done on CBC 01/23/21 due to clumping- will get CBC day of surgery. Per hematologist Dr. Antonieta Pert notes 01/13/21, if platelet count >75, pt can proceed with surgery. Platelet count 01/13/21 was 108  I notified Armen in Dr. Ayesha Rumpf office of platelet result.    VS: BP (!) 143/99   Pulse 66   Temp 36.9 C (Oral)   Resp 16   Ht 6\' 1"  (1.854 m)   Wt 106.4 kg   SpO2 99%   BMI 30.95 kg/m   PROVIDERS: - PCP is [name], MD  - Hematologist is Burney Gauze, MD. Last office visit 01/13/21   LABS: Labs reviewed: Acceptable for surgery.  - Platelets clumped on CBC. Will recheck CBC day of surgery  (all labs ordered are listed, but only abnormal results are displayed)  Labs Reviewed  COMPREHENSIVE METABOLIC PANEL - Abnormal; Notable for the following components:      Result Value   Glucose, Bld 109 (*)    ALT 45 (*)    All other components within normal limits  CBC - Abnormal; Notable for the following components:   WBC 3.9 (*)    RBC 4.14 (*)    All other components within normal limits  TYPE AND SCREEN     IMAGES: US abdomen 11/07/20:  1. At least 3 polyps are seen in the gallbladder with the largest measuring 10 mm. Recommend surgical consultation. By protocol, gallbladder polyps greater than or equal to 10 mm should be assessed by a surgeon. 2. Increased echogenicity in the liver with no focal mass, nonspecific but often seen with hepatic  steatosis. 3. No other abnormalities.   EKG 01/23/21: NSR. Minimal voltage criteria for LVH, may be normal variant   CV: Echo 06/25/19:  1. The left ventricle has normal systolic function with an ejection fraction of 60-65%. The cavity size was normal. There is moderate concentric left ventricular hypertrophy. Left ventricular diastolic Doppler parameters are consistent with impaired relaxation. No evidence of left ventricular regional wall motion abnormalities.  2. The right ventricle has normal systolic function. The cavity was mildly enlarged. There is no increase in right ventricular wall thickness. Right ventricular systolic pressure is normal.  3. Left atrial size was mildly dilated.  4. No evidence of mitral valve stenosis.  5. The aortic valve is grossly normal. Mild thickening of the aortic valve. Mild calcification of the aortic valve. No stenosis of the aortic valve.  6. The aortic arch is normal in size and structure.  7. There is mild to moderate dilatation of the aortic root and of the ascending aorta measuring 43 mm.   Past Medical History:  Diagnosis Date  . Adenomatous polyps   . Allergy    seasonal  . Arthritis   . Cardiomegaly   . ED (erectile dysfunction)   . Gallbladder polyp   . Glaucoma 03/2020   no tx as of yet  . Hepatitis A   .  Hyperlipidemia   . Hypothyroidism   . Non-traumatic rhabdomyolysis 02/21/2019  . Thrombocytopenia ([place])   . Varicocele 02/01/2011   Left testicle    Past Surgical History:  Procedure Laterality Date  . COLONOSCOPY  2014  . EYE SURGERY Right 1956  . EYE SURGERY Left 1963  . LIVER BIOPSY    . POLYPECTOMY    . TONSILLECTOMY      MEDICATIONS: . acetaminophen (TYLENOL) 325 MG tablet  . Cholecalciferol (VITAMIN D3) 125 MCG (5000 UT) CAPS  . cyclobenzaprine (FLEXERIL) 10 MG tablet  . folic acid (FOLVITE) 1 MG tablet  . Glucosamine-Chondroit-Vit C-Mn (GLUCOSAMINE 1500 COMPLEX PO)  . levothyroxine (SYNTHROID) 137 MCG  tablet  . levothyroxine (SYNTHROID) 150 MCG tablet  . loratadine (CLARITIN) 10 MG tablet  . Multiple Vitamin (MULTIVITAMIN) capsule  . Omega-3 Fatty Acids (FISH OIL) 1200 MG CAPS  . rosuvastatin (CRESTOR) 10 MG tablet  . sildenafil (REVATIO) 20 MG tablet   No current facility-administered medications for this encounter.    If labs acceptable day of surgery, I anticipate pt can proceed with surgery as scheduled.  Willeen Cass, PhD, FNP-BC [facility] Short Stay Surgical Center/Anesthesiology Phone: [phone] 01/26/2021 12:04 PM

## 2021-01-27 ENCOUNTER — Other Ambulatory Visit (HOSPITAL_COMMUNITY)
Admission: RE | Admit: 2021-01-27 | Discharge: 2021-01-27 | Disposition: A | Discharge status: Home / Self Care | Payer: Medicare Other | Source: Ambulatory Visit | Attending: Surgery | Admitting: Surgery

## 2021-01-27 DIAGNOSIS — Z20822 Contact with and (suspected) exposure to covid-19: Secondary | ICD-10-CM | POA: Insufficient documentation

## 2021-01-27 DIAGNOSIS — Z01812 Encounter for preprocedural laboratory examination: Secondary | ICD-10-CM | POA: Diagnosis not present

## 2021-01-27 LAB — SARS CORONAVIRUS 2 (TAT 6-24 HRS): SARS Coronavirus 2: NEGATIVE

## 2021-01-30 ENCOUNTER — Encounter (HOSPITAL_COMMUNITY): Payer: Self-pay | Admitting: Surgery

## 2021-01-30 ENCOUNTER — Ambulatory Visit (HOSPITAL_COMMUNITY): Payer: Medicare Other | Admitting: Anesthesiology

## 2021-01-30 ENCOUNTER — Encounter (HOSPITAL_COMMUNITY)
Admission: RE | Disposition: A | Discharge status: Home / Self Care | Payer: Self-pay | Source: Ambulatory Visit | Attending: Surgery

## 2021-01-30 ENCOUNTER — Ambulatory Visit (HOSPITAL_COMMUNITY): Payer: Medicare Other | Admitting: Emergency Medicine

## 2021-01-30 ENCOUNTER — Ambulatory Visit (HOSPITAL_COMMUNITY)
Admission: RE | Admit: 2021-01-30 | Discharge: 2021-01-30 | Disposition: A | Discharge status: Home / Self Care | Payer: Medicare Other | Source: Ambulatory Visit | Attending: Surgery | Admitting: Surgery

## 2021-01-30 DIAGNOSIS — Z79890 Hormone replacement therapy (postmenopausal): Secondary | ICD-10-CM | POA: Insufficient documentation

## 2021-01-30 DIAGNOSIS — Z87891 Personal history of nicotine dependence: Secondary | ICD-10-CM | POA: Insufficient documentation

## 2021-01-30 DIAGNOSIS — Z79899 Other long term (current) drug therapy: Secondary | ICD-10-CM | POA: Insufficient documentation

## 2021-01-30 DIAGNOSIS — K824 Cholesterolosis of gallbladder: Secondary | ICD-10-CM | POA: Diagnosis not present

## 2021-01-30 DIAGNOSIS — D696 Thrombocytopenia, unspecified: Secondary | ICD-10-CM | POA: Diagnosis not present

## 2021-01-30 DIAGNOSIS — E039 Hypothyroidism, unspecified: Secondary | ICD-10-CM | POA: Diagnosis not present

## 2021-01-30 DIAGNOSIS — K8010 Calculus of gallbladder with chronic cholecystitis without obstruction: Secondary | ICD-10-CM | POA: Diagnosis not present

## 2021-01-30 DIAGNOSIS — E782 Mixed hyperlipidemia: Secondary | ICD-10-CM | POA: Diagnosis not present

## 2021-01-30 HISTORY — PX: CHOLECYSTECTOMY: SHX55

## 2021-01-30 LAB — CBC
HCT: 37.7 % — ABNORMAL LOW (ref 39.0–52.0)
Hemoglobin: 13.1 g/dL (ref 13.0–17.0)
MCH: 33.7 pg (ref 26.0–34.0)
MCHC: 34.7 g/dL (ref 30.0–36.0)
MCV: 96.9 fL (ref 80.0–100.0)
Platelets: 96 10*3/uL — ABNORMAL LOW (ref 150–400)
RBC: 3.89 MIL/uL — ABNORMAL LOW (ref 4.22–5.81)
RDW: 12.9 % (ref 11.5–15.5)
WBC: 3.4 10*3/uL — ABNORMAL LOW (ref 4.0–10.5)
nRBC: 0 % (ref 0.0–0.2)

## 2021-01-30 LAB — TYPE AND SCREEN
ABO/RH(D): O POS
Antibody Screen: NEGATIVE

## 2021-01-30 LAB — ABO/RH: ABO/RH(D): O POS

## 2021-01-30 SURGERY — LAPAROSCOPIC CHOLECYSTECTOMY
Anesthesia: General | Site: Abdomen

## 2021-01-30 MED ORDER — EPHEDRINE 5 MG/ML INJ
INTRAVENOUS | Status: AC
  Filled 2021-01-30: qty 10

## 2021-01-30 MED ORDER — DEXAMETHASONE SODIUM PHOSPHATE 10 MG/ML IJ SOLN
INTRAMUSCULAR | Status: AC
  Filled 2021-01-30: qty 1

## 2021-01-30 MED ORDER — PROPOFOL 10 MG/ML IV BOLUS
INTRAVENOUS | Status: DC | PRN
  Administered 2021-01-30: 50 mg via INTRAVENOUS
  Administered 2021-01-30: 150 mg via INTRAVENOUS

## 2021-01-30 MED ORDER — CELECOXIB 200 MG PO CAPS
200.0000 mg | ORAL_CAPSULE | Freq: Once | ORAL | Status: AC
  Administered 2021-01-30: 200 mg via ORAL
  Filled 2021-01-30: qty 1

## 2021-01-30 MED ORDER — OXYCODONE HCL 5 MG PO TABS: 5.0000 mg | ORAL_TABLET | Freq: Four times a day (QID) | ORAL | 0 refills | Status: DC | PRN

## 2021-01-30 MED ORDER — LACTATED RINGERS IV SOLN: INTRAVENOUS | Status: DC

## 2021-01-30 MED ORDER — LIDOCAINE 2% (20 MG/ML) 5 ML SYRINGE
INTRAMUSCULAR | Status: DC | PRN
  Administered 2021-01-30: 60 mg via INTRAVENOUS

## 2021-01-30 MED ORDER — DEXAMETHASONE SODIUM PHOSPHATE 4 MG/ML IJ SOLN
INTRAMUSCULAR | Status: DC | PRN
  Administered 2021-01-30: 10 mg via INTRAVENOUS

## 2021-01-30 MED ORDER — LIDOCAINE HCL (PF) 2 % IJ SOLN
INTRAMUSCULAR | Status: AC
  Filled 2021-01-30: qty 5

## 2021-01-30 MED ORDER — DIPHENHYDRAMINE HCL 50 MG/ML IJ SOLN
INTRAMUSCULAR | Status: DC | PRN
  Administered 2021-01-30: 12.5 mg via INTRAVENOUS

## 2021-01-30 MED ORDER — ROCURONIUM BROMIDE 10 MG/ML (PF) SYRINGE
PREFILLED_SYRINGE | INTRAVENOUS | Status: DC | PRN
  Administered 2021-01-30: 70 mg via INTRAVENOUS

## 2021-01-30 MED ORDER — EPHEDRINE SULFATE-NACL 50-0.9 MG/10ML-% IV SOSY
PREFILLED_SYRINGE | INTRAVENOUS | Status: DC | PRN
  Administered 2021-01-30: 10 mg via INTRAVENOUS

## 2021-01-30 MED ORDER — ROCURONIUM BROMIDE 10 MG/ML (PF) SYRINGE
PREFILLED_SYRINGE | INTRAVENOUS | Status: AC
  Filled 2021-01-30: qty 10

## 2021-01-30 MED ORDER — GLYCOPYRROLATE 0.2 MG/ML IJ SOLN
INTRAMUSCULAR | Status: DC | PRN
  Administered 2021-01-30: .2 mg via INTRAVENOUS

## 2021-01-30 MED ORDER — ONDANSETRON HCL 4 MG/2ML IJ SOLN
INTRAMUSCULAR | Status: DC | PRN
  Administered 2021-01-30: 4 mg via INTRAVENOUS

## 2021-01-30 MED ORDER — FENTANYL CITRATE (PF) 100 MCG/2ML IJ SOLN
INTRAMUSCULAR | Status: AC
  Filled 2021-01-30: qty 2

## 2021-01-30 MED ORDER — SUGAMMADEX SODIUM 200 MG/2ML IV SOLN
INTRAVENOUS | Status: DC | PRN
  Administered 2021-01-30: 220 mg via INTRAVENOUS

## 2021-01-30 MED ORDER — BUPIVACAINE-EPINEPHRINE 0.25% -1:200000 IJ SOLN
INTRAMUSCULAR | Status: DC | PRN
  Administered 2021-01-30: 10 mL

## 2021-01-30 MED ORDER — ORAL CARE MOUTH RINSE: 15.0000 mL | Freq: Once | OROMUCOSAL | Status: AC

## 2021-01-30 MED ORDER — PROPOFOL 10 MG/ML IV BOLUS
INTRAVENOUS | Status: AC
  Filled 2021-01-30: qty 20

## 2021-01-30 MED ORDER — DIPHENHYDRAMINE HCL 50 MG/ML IJ SOLN
INTRAMUSCULAR | Status: AC
  Filled 2021-01-30: qty 1

## 2021-01-30 MED ORDER — CHLORHEXIDINE GLUCONATE 0.12 % MT SOLN
15.0000 mL | Freq: Once | OROMUCOSAL | Status: AC
  Administered 2021-01-30: 15 mL via OROMUCOSAL

## 2021-01-30 MED ORDER — CEFAZOLIN SODIUM-DEXTROSE 2-4 GM/100ML-% IV SOLN
2.0000 g | INTRAVENOUS | Status: AC
  Administered 2021-01-30: 2 g via INTRAVENOUS
  Filled 2021-01-30: qty 100

## 2021-01-30 MED ORDER — FENTANYL CITRATE (PF) 100 MCG/2ML IJ SOLN
INTRAMUSCULAR | Status: DC | PRN
  Administered 2021-01-30 (×4): 50 ug via INTRAVENOUS

## 2021-01-30 MED ORDER — LACTATED RINGERS IR SOLN
Status: DC | PRN
  Administered 2021-01-30: 1000 mL

## 2021-01-30 MED ORDER — BUPIVACAINE-EPINEPHRINE (PF) 0.25% -1:200000 IJ SOLN
INTRAMUSCULAR | Status: AC
  Filled 2021-01-30: qty 30

## 2021-01-30 MED ORDER — 0.9 % SODIUM CHLORIDE (POUR BTL) OPTIME
TOPICAL | Status: DC | PRN
  Administered 2021-01-30: 1000 mL

## 2021-01-30 MED ORDER — ONDANSETRON HCL 4 MG/2ML IJ SOLN
INTRAMUSCULAR | Status: AC
  Filled 2021-01-30: qty 2

## 2021-01-30 SURGICAL SUPPLY — 59 items
ADH SKN CLS APL DERMABOND .7 (GAUZE/BANDAGES/DRESSINGS) ×1
APL PRP STRL LF DISP 70% ISPRP (MISCELLANEOUS) ×1
APL SWBSTK 6 STRL LF DISP (MISCELLANEOUS)
APPLICATOR COTTON TIP 6 STRL (MISCELLANEOUS) IMPLANT
APPLICATOR COTTON TIP 6IN STRL (MISCELLANEOUS)
APPLIER CLIP 5 13 M/L LIGAMAX5 (MISCELLANEOUS) ×2
APR CLP MED LRG 5 ANG JAW (MISCELLANEOUS) ×1
BAG SPEC RTRVL LRG 6X4 10 (ENDOMECHANICALS) ×1
BLADE SURG 15 STRL LF DISP TIS (BLADE) ×1 IMPLANT
BLADE SURG 15 STRL SS (BLADE) ×2
BLADE SURG SZ11 CARB STEEL (BLADE) ×2 IMPLANT
CABLE HIGH FREQUENCY MONO STRZ (ELECTRODE) IMPLANT
CHLORAPREP W/TINT 26 (MISCELLANEOUS) ×2 IMPLANT
CLIP APPLIE 5 13 M/L LIGAMAX5 (MISCELLANEOUS) ×1 IMPLANT
COVER MAYO STAND STRL (DRAPES) IMPLANT
COVER SURGICAL LIGHT HANDLE (MISCELLANEOUS) ×2 IMPLANT
COVER WAND RF STERILE (DRAPES) IMPLANT
DECANTER SPIKE VIAL GLASS SM (MISCELLANEOUS) ×2 IMPLANT
DERMABOND ADVANCED (GAUZE/BANDAGES/DRESSINGS) ×1
DERMABOND ADVANCED .7 DNX12 (GAUZE/BANDAGES/DRESSINGS) ×1 IMPLANT
DRAPE C-ARM 42X120 X-RAY (DRAPES) IMPLANT
DRAPE UTILITY XL STRL (DRAPES) ×2 IMPLANT
ELECT L-HOOK LAP 45CM DISP (ELECTROSURGICAL)
ELECT PENCIL ROCKER SW 15FT (MISCELLANEOUS) ×2 IMPLANT
ELECT REM PT RETURN 15FT ADLT (MISCELLANEOUS) ×2 IMPLANT
ELECTRODE L-HOOK LAP 45CM DISP (ELECTROSURGICAL) IMPLANT
GAUZE 4X4 16PLY RFD (DISPOSABLE) ×2 IMPLANT
GLOVE BIOGEL PI IND STRL 6 (GLOVE) ×1 IMPLANT
GLOVE BIOGEL PI INDICATOR 6 (GLOVE) ×1
GLOVE SURG POLYISO LF SZ5.5 (GLOVE) ×2 IMPLANT
GOWN STRL REUS W/TWL LRG LVL3 (GOWN DISPOSABLE) ×2 IMPLANT
GOWN STRL REUS W/TWL XL LVL3 (GOWN DISPOSABLE) ×4 IMPLANT
GRASPER SUT TROCAR 14GX15 (MISCELLANEOUS) IMPLANT
HEMOSTAT SNOW SURGICEL 2X4 (HEMOSTASIS) IMPLANT
KIT BASIN OR (CUSTOM PROCEDURE TRAY) ×2 IMPLANT
KIT TURNOVER KIT A (KITS) ×2 IMPLANT
L-HOOK LAP DISP 36CM (ELECTROSURGICAL) ×2
LHOOK LAP DISP 36CM (ELECTROSURGICAL) ×1 IMPLANT
NDL INSUFFLATION 14GA 120MM (NEEDLE) IMPLANT
NEEDLE HYPO 22GX1.5 SAFETY (NEEDLE) ×2 IMPLANT
NEEDLE INSUFFLATION 14GA 120MM (NEEDLE) IMPLANT
PACK UNIVERSAL I (CUSTOM PROCEDURE TRAY) ×2 IMPLANT
POUCH SPECIMEN RETRIEVAL 10MM (ENDOMECHANICALS) ×2 IMPLANT
SCISSORS LAP 5X35 DISP (ENDOMECHANICALS) ×2 IMPLANT
SET CHOLANGIOGRAPH MIX (MISCELLANEOUS) IMPLANT
SET IRRIG TUBING LAPAROSCOPIC (IRRIGATION / IRRIGATOR) ×2 IMPLANT
SET TUBE SMOKE EVAC HIGH FLOW (TUBING) ×2 IMPLANT
SLEEVE XCEL OPT CAN 5 100 (ENDOMECHANICALS) ×4 IMPLANT
SOL ANTI FOG 6CC (MISCELLANEOUS) ×1 IMPLANT
SOLUTION ANTI FOG 6CC (MISCELLANEOUS) ×1
SUT MNCRL AB 4-0 PS2 18 (SUTURE) ×2 IMPLANT
SUT VICRYL 0 UR6 27IN ABS (SUTURE) ×2 IMPLANT
SYR 20ML LL LF (SYRINGE) IMPLANT
SYR CONTROL 10ML LL (SYRINGE) ×2 IMPLANT
TOWEL OR 17X26 10 PK STRL BLUE (TOWEL DISPOSABLE) ×2 IMPLANT
TOWEL OR NON WOVEN STRL DISP B (DISPOSABLE) IMPLANT
TROCAR BLADELESS OPT 5 100 (ENDOMECHANICALS) ×2 IMPLANT
TROCAR XCEL 12X100 BLDLESS (ENDOMECHANICALS) IMPLANT
TROCAR XCEL BLUNT TIP 100MML (ENDOMECHANICALS) ×1 IMPLANT

## 2021-01-30 NOTE — Discharge Instructions (Signed)
[facility] DISCHARGE INSTRUCTIONS  Activity  No heavy lifting greater than 10 pounds for 4 weeks after surgery.  Ok to shower, but do not bathe or submerge incisions underwater.  Do not drive while taking narcotic pain medication.  Wound Care  Your incisions are covered with skin glue called Dermabond. This will peel off on its own over time.  You may shower and allow warm soapy water to run over your incisions in 48 hours after surgery. Gently pat dry.  Do not submerge your incision underwater.  Monitor your incision for any new redness, tenderness, or drainage.   General Anesthesia, Adult, Care After This sheet gives you information about how to care for yourself after your procedure. Your health care provider may also give you more specific instructions. If you have problems or questions, contact your health care provider. What can I expect after the procedure? After the procedure, the following side effects are common:  Pain or discomfort at the IV site.  Nausea.  Vomiting.  Sore throat.  Trouble concentrating.  Feeling cold or chills.  Feeling weak or tired.  Sleepiness and fatigue.  Soreness and body aches. These side effects can affect parts of the body that were not involved in surgery. Follow these instructions at home: For the time period you were told by your health care provider:  Rest.  Do not participate in activities where you could fall or become injured.  Do not drive or use machinery.  Do not drink alcohol.  Do not take sleeping pills or medicines that cause drowsiness.  Do not make important decisions or sign legal documents.  Do not take care of children on your own.   Eating and drinking  Follow any instructions from your health care provider about eating or drinking restrictions.  When you feel hungry, start by eating small amounts of foods that are soft and easy to digest (bland), such as toast. Gradually return to  your regular diet.  Drink enough fluid to keep your urine pale yellow.  If you vomit, rehydrate by drinking water, juice, or clear broth. General instructions  If you have sleep apnea, surgery and certain medicines can increase your risk for breathing problems. Follow instructions from your health care provider about wearing your sleep device: ? Anytime you are sleeping, including during daytime naps. ? While taking prescription pain medicines, sleeping medicines, or medicines that make you drowsy.  Have a responsible adult stay with you for the time you are told. It is important to have someone help care for you until you are awake and alert.  Return to your normal activities as told by your health care provider. Ask your health care provider what activities are safe for you.  Take over-the-counter and prescription medicines only as told by your health care provider.  If you smoke, do not smoke without supervision.  Keep all follow-up visits as told by your health care provider. This is important. Contact a health care provider if:  You have nausea or vomiting that does not get better with medicine.  You cannot eat or drink without vomiting.  You have pain that does not get better with medicine.  You are unable to pass urine.  You develop a skin rash.  You have a fever.  You have redness around your IV site that gets worse. Get help right away if:  You have difficulty breathing.  You have chest pain.  You have blood in your urine or stool, or you vomit  blood. Summary  After the procedure, it is common to have a sore throat or nausea. It is also common to feel tired.  Have a responsible adult stay with you for the time you are told. It is important to have someone help care for you until you are awake and alert.  When you feel hungry, start by eating small amounts of foods that are soft and easy to digest (bland), such as toast. Gradually return to your regular  diet.  Drink enough fluid to keep your urine pale yellow.  Return to your normal activities as told by your health care provider. Ask your health care provider what activities are safe for you. This information is not intended to replace advice given to you by your health care provider. Make sure you discuss any questions you have with your health care provider. Document Revised: 08/07/2020 Document Reviewed: 03/06/2020 Elsevier Patient Education  2021 [place].   When to Call us:  Fever greater than 100.5  New redness, drainage, or swelling at incision site  Severe pain, nausea, or vomiting  Jaundice (yellowing of the whites of the eyes or skin)  Follow-up You have an appointment scheduled with Dr. Zenia Resides on 02/24/21 at 11:45am. This will be at the [facility] Surgery office at [address], [place], [place], [place]. Please arrive at least 15 minutes prior to your scheduled appointment time.  For questions or concerns, please call the office at [phone].

## 2021-01-30 NOTE — Transfer of Care (Signed)
Immediate Anesthesia Transfer of Care Note  Patient: Timothy Grant  Procedure(s) Performed: LAPAROSCOPIC CHOLECYSTECTOMY (N/A Abdomen)  Patient Location: PACU  Anesthesia Type:General  Level of Consciousness: awake, alert  and oriented  Airway & Oxygen Therapy: Patient Spontanous Breathing and Patient connected to face mask oxygen  Post-op Assessment: Report given to RN, Post -op Vital signs reviewed and stable and Patient moving all extremities X 4  Post vital signs: Reviewed and stable  Last Vitals:  Vitals Value Taken Time  BP    Temp    Pulse 81 01/30/21 1433  Resp 17 01/30/21 1433  SpO2 100 % 01/30/21 1433  Vitals shown include unvalidated device data.  Last Pain:  Vitals:   01/30/21 1129  TempSrc:   PainSc: 0-No pain         Complications: No complications documented.

## 2021-01-30 NOTE — Op Note (Signed)
Date: 01/30/21  Patient: Timothy Grant MRN: [id number]  Preoperative Diagnosis: Gallbladder polyps Postoperative Diagnosis: Same  Procedure: Laparoscopic cholecystectomy  Surgeon: Michaelle Birks, MD Assistant: Leighton Ruff, MD  EBL: Minimal  Anesthesia: General  Specimens: Gallbladder  Indications: [name] is a 75 yo male with a history of relapsing hepatitis A, for which he was undergoing surveillance ultrasounds of the liver. These incidentally showed gallbladder polyps. His most recent US on 11/07/20 showed multiple polyps, the largest of which was 1cm in diameter. His LFTs have normalized and bilirubin is normal. Given the size of the largest polyp, cholecystectomy was recommended. Of note he has previously undergone a liver biopsy that showed focal areas of periportal fibrosis.  Findings: Nodular liver, suspicious for cirrhosis. No evidence of ascites. No evidence of acute cholecystitis.  Procedure details: Informed consent was obtained in the preoperative area prior to the procedure. The patient was brought to the operating room and placed on the table in the supine position. General anesthesia was induced and appropriate lines and drains were placed for intraoperative monitoring. Perioperative antibiotics were administered per SCIP guidelines. The abdomen was prepped and draped in the usual sterile fashion. A pre-procedure timeout was taken verifying patient identity, surgical site and procedure to be performed.  A small infraumbilical skin incision was made, the subcutaneous tissue was divided with cautery, and the umbilical stalk was grasped and elevated. There was a small umbilical hernia defect. The underlying peritoneum was incised, and a 52mm Hassan trocar was placed through the hernia defect. The peritoneal cavity was inspected with no evidence of visceral or vascular injury. The liver was diffusely nodular in appearance, consistent with known liver disease. Three 78mm  ports were placed in the right subcostal margin, all under direct visualization. The fundus of the gallbladder was grasped and retracted cephalad. There were some omental adhesions to the gallbladder, which were taken down with cautery. The infundibulum was retracted laterally. The cystic triangle was dissected out using cautery and blunt dissection, and the critical view of safety was obtained. The cystic duct and cystic artery were clipped and ligated. The cystic duct was dilated and so a PDS endoloop was placed on the cystic duct stump. The gallbladder was taken off the liver using cautery. The specimen was placed in an endocatch bag and removed. The surgical site was irrigated with saline until the effluent was clear. Hemostasis was achieved in the gallbladder fossa using cautery. The cystic duct and artery stumps were visually inspected and there was no evidence of bile leak or bleeding. The ports were removed under direct visualization and the abdomen was desufflated. The umbilical port site fascia was closed with a 0 vicryl suture. The skin at all port sites was closed with 4-0 monocryl subcuticular suture. Dermabond was applied.  The patient tolerated the procedure well with no apparent complications. All counts were correct x2 at the end of the procedure. The patient was extubated and taken to PACU in stable condition.  Michaelle Birks, MD 01/30/21 2:37 PM

## 2021-01-30 NOTE — Interval H&P Note (Signed)
History and Physical Interval Note:  01/30/2021 1:16 PM  Timothy Grant  has presented today for surgery, with the diagnosis of GALLBLADDER POLYPS.  The various methods of treatment have been discussed with the patient and family. After consideration of risks, benefits and other options for treatment, the patient has consented to  Procedure(s) with comments: LAPAROSCOPIC CHOLECYSTECTOMY (N/A) - ROOM 1 STARTING AT 01:00PM as a surgical intervention.  The patient's history has been reviewed, patient examined, no change in status, stable for surgery.  I have reviewed the patient's chart and labs.  Questions were answered to the patient's satisfaction.     Dwan Bolt

## 2021-01-30 NOTE — Anesthesia Procedure Notes (Signed)
Procedure Name: Intubation Date/Time: 01/30/2021 1:24 PM Performed by: Claudia Desanctis, CRNA Pre-anesthesia Checklist: Patient identified, Emergency Drugs available, Suction available and Patient being monitored Patient Re-evaluated:Patient Re-evaluated prior to induction Oxygen Delivery Method: Circle system utilized Preoxygenation: Pre-oxygenation with 100% oxygen Induction Type: IV induction Ventilation: Mask ventilation without difficulty Laryngoscope Size: 2 and Miller Grade View: Grade I Tube type: Oral Tube size: 7.5 mm Number of attempts: 1 Airway Equipment and Method: Stylet Placement Confirmation: ETT inserted through vocal cords under direct vision,  positive ETCO2 and breath sounds checked- equal and bilateral Secured at: 21 cm Tube secured with: Tape Dental Injury: Teeth and Oropharynx as per pre-operative assessment

## 2021-02-01 ENCOUNTER — Encounter (HOSPITAL_COMMUNITY): Payer: Self-pay | Admitting: Surgery

## 2021-02-02 LAB — SURGICAL PATHOLOGY

## 2021-02-06 NOTE — Anesthesia Postprocedure Evaluation (Signed)
Anesthesia Post Note  Patient: Timothy Grant  Procedure(s) Performed: LAPAROSCOPIC CHOLECYSTECTOMY (N/A Abdomen)     Patient location during evaluation: PACU Anesthesia Type: General Level of consciousness: awake and alert Pain management: pain level controlled Vital Signs Assessment: post-procedure vital signs reviewed and stable Respiratory status: spontaneous breathing, nonlabored ventilation, respiratory function stable and patient connected to nasal cannula oxygen Cardiovascular status: blood pressure returned to baseline and stable Postop Assessment: no apparent nausea or vomiting Anesthetic complications: no   No complications documented.  Last Vitals:  Vitals:   01/30/21 1545 01/30/21 1606  BP: 134/79 (!) 150/96  Pulse: 60   Resp: 11   Temp:  36.4 C  SpO2: 94% 97%    Last Pain:  Vitals:   01/30/21 1606  TempSrc: Oral  PainSc: 0-No pain                 Stanisha Lorenz S

## 2021-02-19 ENCOUNTER — Other Ambulatory Visit: Payer: Medicare Other

## 2021-02-23 ENCOUNTER — Other Ambulatory Visit (INDEPENDENT_AMBULATORY_CARE_PROVIDER_SITE_OTHER): Payer: Medicare Other

## 2021-02-23 ENCOUNTER — Other Ambulatory Visit: Payer: Self-pay

## 2021-02-23 DIAGNOSIS — R748 Abnormal levels of other serum enzymes: Secondary | ICD-10-CM

## 2021-02-23 LAB — COMPREHENSIVE METABOLIC PANEL
ALT: 44 U/L (ref 0–53)
AST: 33 U/L (ref 0–37)
Albumin: 4.3 g/dL (ref 3.5–5.2)
Alkaline Phosphatase: 111 U/L (ref 39–117)
BUN: 17 mg/dL (ref 6–23)
CO2: 24 mEq/L (ref 19–32)
Calcium: 9.5 mg/dL (ref 8.4–10.5)
Chloride: 104 mEq/L (ref 96–112)
Creatinine, Ser: 1.04 mg/dL (ref 0.40–1.50)
GFR: 70.21 mL/min (ref >60.00)
Glucose, Bld: 109 mg/dL — ABNORMAL HIGH (ref 70–99)
Potassium: 4.3 mEq/L (ref 3.5–5.1)
Sodium: 136 mEq/L (ref 135–145)
Total Bilirubin: 0.6 mg/dL (ref 0.2–1.2)
Total Protein: 7 g/dL (ref 6.0–8.3)

## 2021-02-23 NOTE — Addendum Note (Signed)
Addended by: Jacob Moores on: 02/23/2021 10:53 AM   Modules accepted: Orders

## 2021-03-12 ENCOUNTER — Telehealth: Payer: Self-pay | Admitting: Family Medicine

## 2021-03-12 NOTE — Chronic Care Management (AMB) (Signed)
  Chronic Care Management   Outreach Note  03/12/2021 Name: Timothy Grant MRN: [id number] DOB: 1946-08-18  Referred by: [name], MD Reason for referral : No chief complaint on file.   An unsuccessful telephone outreach was attempted today. The patient was referred to the pharmacist for assistance with care management and care coordination.   Follow Up Plan:   Lauretta Grill Upstream Scheduler

## 2021-03-19 ENCOUNTER — Telehealth: Payer: Self-pay | Admitting: Family Medicine

## 2021-03-19 NOTE — Chronic Care Management (AMB) (Signed)
  Chronic Care Management   Note  03/19/2021 Name: Timothy Grant MRN: [id number] DOB: [birth year]  Timothy Grant is a 75 y.o. year old male who is a primary care patient of [name], MD. I reached out to Timothy Grant by phone today in response to a referral sent by Timothy Grant PCP, [name], MD.   Timothy Grant was given information about Chronic Care Management services today including:  1. CCM service includes personalized support from designated clinical staff supervised by his physician, including individualized plan of care and coordination with other care providers 2. 24/7 contact phone numbers for assistance for urgent and routine care needs. 3. Service will only be billed when office clinical staff spend 20 minutes or more in a month to coordinate care. 4. Only one practitioner may furnish and bill the service in a calendar month. 5. The patient may stop CCM services at any time (effective at the end of the month) by phone call to the office staff.   Patient agreed to services and verbal consent obtained.   Follow up plan:   Lauretta Grill Upstream Scheduler

## 2021-03-19 NOTE — Chronic Care Management (AMB) (Signed)
  Chronic Care Management   Outreach Note  03/19/2021 Name: Timothy Grant MRN: [id number] DOB: 03/17/1946  Referred by: [name], MD Reason for referral : No chief complaint on file.   A second unsuccessful telephone outreach was attempted today. The patient was referred to pharmacist for assistance with care management and care coordination.  Follow Up Plan:   Lauretta Grill Upstream Scheduler

## 2021-04-01 ENCOUNTER — Telehealth: Payer: Self-pay

## 2021-04-01 ENCOUNTER — Telehealth: Payer: Medicare Other

## 2021-04-01 NOTE — Chronic Care Management (AMB) (Signed)
Chronic Care Management Pharmacy Assistant   Name: Gavin Telford  MRN: [id number] DOB: 10-05-1946  Reason for Encounter: Chart Prep/ IQ   Recent office visits:  11/17/20- Billey Chang, MD-conditions addressed, shingrix vaccination given, follow up 6 months for physical+ second dose  Recent consult visits:  01/13/21- Burney Gauze, MD ( Oncology)- follow up visit for thrombocytopenia, follow up 1 yr 01/02/21- Amy Esterwood, PA-C [facility])- LLQ abdominal pain, short course augmentin 875 mg x 7 ds, follow up as needed  Hospital visits:  Medication Reconciliation was completed by comparing discharge summary, patient's EMR and Pharmacy list, and upon discussion with patient.  Admitted to and discharged from [facility] on 01/30/21 for a laparoscopic cholecystectomy.  New?Medications Started at [facility] Discharge:?? oxyCODONE 5 MG 1 tablet every six hours as needed for pain    All other medications will remain the same.    Medications: Outpatient Encounter Medications as of 04/01/2021  Medication Sig Note  . Cholecalciferol (VITAMIN D3) 125 MCG (5000 UT) CAPS Take 5,000 Units by mouth daily.   . cyclobenzaprine (FLEXERIL) 10 MG tablet Take 10 mg by mouth 3 (three) times daily as needed for muscle spasms.   . folic acid (FOLVITE) 1 MG tablet Take 1 tablet (1 mg total) by mouth daily.   . Glucosamine-Chondroit-Vit C-Mn (GLUCOSAMINE 1500 COMPLEX PO) Take 2,000 mg by mouth daily.   Marland Kitchen ibuprofen (ADVIL) 200 MG tablet Take 200 mg by mouth every 6 (six) hours as needed for mild pain. 01/30/2021: Took 400mg  01/29/21, 2200  . levothyroxine (SYNTHROID) 137 MCG tablet Take 1 tablet (137 mcg total) by mouth daily before breakfast. (Patient taking differently: Take 137 mcg by mouth See admin instructions. Alternating with 150 mcg every other day)   . levothyroxine (SYNTHROID) 150 MCG tablet Take 1 tablet (150 mcg total) by mouth daily. Take 1 tablet (150 mcg) by mouth Monday,  Wednesday, and Friday only   . loratadine (CLARITIN) 10 MG tablet Take 10 mg by mouth daily as needed for allergies.   . Multiple Vitamin (MULTIVITAMIN) capsule Take 1 capsule by mouth daily.    . Omega-3 Fatty Acids (FISH OIL) 1200 MG CAPS Take 1,200 mg by mouth daily.   Marland Kitchen oxyCODONE (OXY IR/ROXICODONE) 5 MG immediate release tablet Take 1 tablet (5 mg total) by mouth every 6 (six) hours as needed for severe pain.   . rosuvastatin (CRESTOR) 10 MG tablet Take 1 tablet (10 mg total) by mouth daily.   . sildenafil (REVATIO) 20 MG tablet Use 5 tablets PO as needed (Patient taking differently: Take 1,000 mg by mouth daily as needed (ED). Use 5 tablets PO as needed)    No facility-administered encounter medications on file as of 04/01/2021.   Current Documented Medications Vitamin D3 125 mcg Cyclobenzaprine 10 mg- 30 DS last filled 36/14/43 Folic acid 1 mg- 90 DS last filled 02/06/21 GLUCOSAMINE 1500 COMPLEX  Ibuprofen 200 mg Levothyroxine 137 mcg - 90 DS last filled 12/17/20 Patient reported alternating doses qd resulting in abundance of on hand supply  Levothyroxine 150 mcg - 90 DS last filled 12/17/20 Patient reported alternating doses qd resulting in abundance of on hand supply  Loratadine 10 mg Multivitamin Omega 3 1200 mg Oxycodone 5 mg- 3 DS last filled 01/30/21 Rosuvastatin 10 mg- 90 DS last filled 02/11/21 Sildenafil 20 mg   Have you seen any other providers since your last visit?  Patient stated he has not seen any  other providers   Any changes  in your medications or health?  Patient stated there are no changes in his medications  Any side effects from any medications?  Patient reported no side effects   Do you have an symptoms or problems not managed by your medications?  Patient stated he has no unmanaged symptoms  Any concerns about your health right now?  Patient stated he was not concerned about anything regarding his health  Has your provider asked that you check  blood pressure, blood sugar, or follow special diet at home?  Patient does not follow any health regimens  Do you get any type of exercise on a regular basis?  Patient stated that he does not do an excessive amount of activity, but he does get a decent amount of movement in his day. He has been getting outside more as the weather gets nicer.He was preparing to go play golf when I called.   Can you think of a goal you would like to reach for your health?  Patient stated he could not think of anything at the moment   Do you have any problems getting your medications? Patient currently has no problems with [facility]  Is there anything that you would like to discuss during the appointment? Patient did not have anything to discuss.  Please bring medications and supplements to appointment Patient rescheduled initial phone visit wit CPP for 05/09 @ [facility] [employer], [place]

## 2021-04-13 ENCOUNTER — Ambulatory Visit: Payer: Medicare Other

## 2021-05-15 ENCOUNTER — Other Ambulatory Visit: Payer: Self-pay | Admitting: Family Medicine

## 2021-05-18 ENCOUNTER — Encounter: Payer: Self-pay | Admitting: Family Medicine

## 2021-05-18 ENCOUNTER — Encounter: Payer: Medicare Other | Admitting: Family Medicine

## 2021-05-18 ENCOUNTER — Other Ambulatory Visit: Payer: Self-pay

## 2021-05-18 ENCOUNTER — Ambulatory Visit (INDEPENDENT_AMBULATORY_CARE_PROVIDER_SITE_OTHER): Payer: Medicare Other | Admitting: Family Medicine

## 2021-05-18 VITALS — BP 127/82 | HR 63 | Temp 97.6°F | Resp 16 | Ht 73.0 in | Wt 237.4 lb

## 2021-05-18 DIAGNOSIS — E222 Syndrome of inappropriate secretion of antidiuretic hormone: Secondary | ICD-10-CM

## 2021-05-18 DIAGNOSIS — D696 Thrombocytopenia, unspecified: Secondary | ICD-10-CM | POA: Diagnosis not present

## 2021-05-18 DIAGNOSIS — Z0000 Encounter for general adult medical examination without abnormal findings: Secondary | ICD-10-CM

## 2021-05-18 DIAGNOSIS — D72819 Decreased white blood cell count, unspecified: Secondary | ICD-10-CM

## 2021-05-18 DIAGNOSIS — E039 Hypothyroidism, unspecified: Secondary | ICD-10-CM | POA: Diagnosis not present

## 2021-05-18 DIAGNOSIS — N529 Male erectile dysfunction, unspecified: Secondary | ICD-10-CM

## 2021-05-18 DIAGNOSIS — E782 Mixed hyperlipidemia: Secondary | ICD-10-CM

## 2021-05-18 DIAGNOSIS — Z8619 Personal history of other infectious and parasitic diseases: Secondary | ICD-10-CM

## 2021-05-18 LAB — LIPID PANEL
Cholesterol: 161 mg/dL (ref 0–200)
HDL: 61.5 mg/dL (ref >39.00)
LDL Cholesterol: 83 mg/dL (ref 0–99)
NonHDL: 99.13
Total CHOL/HDL Ratio: 3
Triglycerides: 83 mg/dL (ref 0.0–149.0)
VLDL: 16.6 mg/dL (ref 0.0–40.0)

## 2021-05-18 LAB — BASIC METABOLIC PANEL
BUN: 15 mg/dL (ref 6–23)
CO2: 25 mEq/L (ref 19–32)
Calcium: 9.9 mg/dL (ref 8.4–10.5)
Chloride: 102 mEq/L (ref 96–112)
Creatinine, Ser: 1 mg/dL (ref 0.40–1.50)
GFR: 73.47 mL/min (ref >60.00)
Glucose, Bld: 106 mg/dL — ABNORMAL HIGH (ref 70–99)
Potassium: 4.3 mEq/L (ref 3.5–5.1)
Sodium: 134 mEq/L — ABNORMAL LOW (ref 135–145)

## 2021-05-18 LAB — CBC WITH DIFFERENTIAL/PLATELET
Basophils Absolute: 0 10*3/uL (ref 0.0–0.1)
Basophils Relative: 0.8 % (ref 0.0–3.0)
Eosinophils Absolute: 0.2 10*3/uL (ref 0.0–0.7)
Eosinophils Relative: 4.9 % (ref 0.0–5.0)
HCT: 39.7 % (ref 39.0–52.0)
Hemoglobin: 13.6 g/dL (ref 13.0–17.0)
Lymphocytes Relative: 24.6 % (ref 12.0–46.0)
Lymphs Abs: 0.9 10*3/uL (ref 0.7–4.0)
MCHC: 34.2 g/dL (ref 30.0–36.0)
MCV: 98.3 fl (ref 78.0–100.0)
Monocytes Absolute: 0.4 10*3/uL (ref 0.1–1.0)
Monocytes Relative: 11.7 % (ref 3.0–12.0)
Neutro Abs: 2.1 10*3/uL (ref 1.4–7.7)
Neutrophils Relative %: 58 % (ref 43.0–77.0)
Platelets: 122 10*3/uL — ABNORMAL LOW (ref 150.0–400.0)
RBC: 4.03 Mil/uL — ABNORMAL LOW (ref 4.22–5.81)
RDW: 14.1 % (ref 11.5–15.5)
WBC: 3.6 10*3/uL — ABNORMAL LOW (ref 4.0–10.5)

## 2021-05-18 LAB — HEPATIC FUNCTION PANEL
ALT: 45 U/L (ref 0–53)
AST: 43 U/L — ABNORMAL HIGH (ref 0–37)
Albumin: 4.5 g/dL (ref 3.5–5.2)
Alkaline Phosphatase: 103 U/L (ref 39–117)
Bilirubin, Direct: 0.2 mg/dL (ref 0.0–0.3)
Total Bilirubin: 0.8 mg/dL (ref 0.2–1.2)
Total Protein: 8 g/dL (ref 6.0–8.3)

## 2021-05-18 LAB — TSH: TSH: 8.27 u[IU]/mL — ABNORMAL HIGH (ref 0.35–4.50)

## 2021-05-18 MED ORDER — FOLIC ACID 1 MG PO TABS: 1.0000 mg | ORAL_TABLET | Freq: Every day | ORAL | 3 refills | Status: DC

## 2021-05-18 MED ORDER — SILDENAFIL CITRATE 20 MG PO TABS: ORAL_TABLET | ORAL | 3 refills | Status: DC

## 2021-05-18 MED ORDER — LEVOTHYROXINE SODIUM 137 MCG PO TABS: ORAL_TABLET | ORAL | 3 refills | Status: DC

## 2021-05-18 MED ORDER — CYCLOBENZAPRINE HCL 10 MG PO TABS: 10.0000 mg | ORAL_TABLET | Freq: Three times a day (TID) | ORAL | 1 refills | Status: DC | PRN

## 2021-05-18 MED ORDER — LEVOTHYROXINE SODIUM 150 MCG PO TABS: ORAL_TABLET | ORAL | 3 refills | Status: DC

## 2021-05-18 NOTE — Patient Instructions (Signed)
Please return in 12 months for your annual complete physical; please come fasting.   I will release your lab results to you on your MyChart account with further instructions. Please reply with any questions.    If you have any questions or concerns, please don't hesitate to send me a message via MyChart or call the office at [phone]. Thank you for visiting with us today! It's our pleasure caring for you.    

## 2021-05-18 NOTE — Progress Notes (Signed)
Subjective  Chief Complaint  Patient presents with   Annual Exam    Fasting     HPI: Timothy Grant is a 75 y.o. male who presents to [facility] today for a Male Wellness Visit. He also has the concerns and/or needs as listed above in the chief complaint. These will be addressed in addition to the Health Maintenance Visit.   Wellness Visit: annual visit with health maintenance review and exam   Health maintenance: Screens are up-to-date.  Immunizations up-to-date.  Overall he feels very well.  No new concerns  Body mass index is 31.32 kg/m. Wt Readings from Last 3 Encounters:  05/18/21 237 lb 6.4 oz (107.7 kg)  01/30/21 234 lb 9.6 oz (106.4 kg)  01/23/21 234 lb 9.6 oz (106.4 kg)     Chronic disease management visit and/or acute problem visit: History of gallbladder polyp status postcholecystectomy in February.  I reviewed GI notes, surgery evaluation and hospitalization records.  Fortunately, his laparoscopic cholecystectomy went well.  His recovery was uneventful.  His lab work has not been checked since, he did have mildly elevated LFTs prior to this. History of hepatitis A: Recovered.  Feels well.  No right upper quadrant pain or jaundice.  Due for recheck.  Follows with GI closely.  No clear history of cirrhosis. Hypothyroidism: Does well on levothyroxine.  Due for recheck.  He denies symptoms of high or low thyroid. Hyperlipidemia: On Crestor 10 mg nightly.  Tolerating well.  Monitoring LFTs.  May be able to push dose higher if indicated. Osteoarthritis minor muscle aches: Request Flexeril refill mostly for mild aches and pains after yard work etc. ED: Does well on sildenafil: Requests refill.  No evidence effects. Thrombocytopenia: Idiopathic.  Monitoring.  No petechiae or easy bleeding. History of SIADH: Monitoring closely.  Due for recheck  Patient Active Problem List   Diagnosis Date Noted   History of hepatitis A 05/15/2020   SIADH  (syndrome of inappropriate ADH production) ([place]) 05/15/2020   Acquired hypothyroidism 02/05/2011   Adenomatous colon polyp 02/05/2011   Mixed hyperlipidemia 02/05/2011   Osteoarthritis of finger of left hand 01/07/2017   Leukopenia 08/09/2012   Thrombocytopenia ([place]) 07/09/2019   Trigger finger, left ring finger 01/07/2017   Male erectile disorder 02/09/2012   Gallbladder polyp 11/17/2020   Health Maintenance  Topic Date Due   Zoster Vaccines- Shingrix (1 of 2) Never done   TETANUS/TDAP  02/04/2021   INFLUENZA VACCINE  07/06/2021   COLONOSCOPY (Pts 45-88yrs Insurance coverage will need to be confirmed)  05/15/2023   COVID-19 Vaccine  Completed   Hepatitis C Screening  Completed   PNA vac Low Risk Adult  Completed   HPV VACCINES  Aged Out   Immunization History  Administered Date(s) Administered   Fluad Quad(high Dose 65+) 08/14/2019   Hepatitis B, ped/adol 08/06/2009   Influenza Split 08/07/2011, 08/28/2013   Influenza, High Dose Seasonal PF 09/26/2017   Influenza, Quadrivalent, Recombinant, Inj, Pf 09/06/2016   Influenza, Seasonal, Injecte, Preservative Fre 09/04/2014, 09/09/2015   Influenza,trivalent, recombinat, inj, PF 08/09/2012   Influenza-Unspecified 09/22/2018, 09/19/2020   Moderna Sars-Covid-2 Vaccination 03/06/2020, 04/03/2020, 09/30/2020, 05/14/2021   Pneumococcal Conjugate-13 11/04/2014   Pneumococcal Polysaccharide-23 02/05/2011   Tdap 02/05/2011   Zoster, Live 02/05/2011   We updated and reviewed the patient's past history in detail and it is documented below. Allergies: Patient has no active allergies. Past Medical History  has a past medical history of Adenomatous polyps, Allergy, Arthritis, Cardiomegaly, ED (  erectile dysfunction), Gallbladder polyp, Glaucoma (03/2020), Hepatitis A, Hyperlipidemia, Hypothyroidism, Non-traumatic rhabdomyolysis (02/21/2019), Thrombocytopenia ([place]), and Varicocele (02/01/2011). Past Surgical History Patient  has a past surgical  history that includes Tonsillectomy; Eye surgery (Right, 1956); Eye surgery (Left, 1963); Liver biopsy; Colonoscopy (2014); Polypectomy; and Cholecystectomy (N/A, 01/30/2021). Social History Patient  reports that he has quit smoking. His smoking use included cigarettes. He has never used smokeless tobacco. He reports previous alcohol use of about 6.0 standard drinks of alcohol per week. He reports that he does not use drugs. Family History family history includes Alzheimer's disease in his mother; Arthritis in his mother; Cancer in his brother and sister; Colon polyps in his brother; Dementia in his mother; Heart disease in his father and paternal grandmother; Hyperlipidemia in his brother; Stroke in his brother and mother. Review of Systems: Constitutional: negative for fever or malaise Ophthalmic: negative for photophobia, double vision or loss of vision Cardiovascular: negative for chest pain, dyspnea on exertion, or new LE swelling Respiratory: negative for SOB or persistent cough Gastrointestinal: negative for abdominal pain, change in bowel habits or melena Genitourinary: negative for dysuria or gross hematuria Musculoskeletal: negative for new gait disturbance or muscular weakness Integumentary: negative for new or persistent rashes Neurological: negative for TIA or stroke symptoms Psychiatric: negative for SI or delusions Allergic/Immunologic: negative for hives  Patient Care Team    Relationship Specialty Notifications Start End  [name], MD PCP - General Family Medicine  11/07/17   Stephannie Li, [place]  Ophthalmology  03/22/18   Daryll Brod, [place] Physician Orthopedic Surgery  03/22/18   [place]  Dentistry  03/22/18   Juanita Craver, MD Consulting Physician Gastroenterology  03/22/18   Thornton Park, MD Consulting Physician Gastroenterology  03/27/19   Tish Men, MD (Inactive) Consulting Physician Hematology  08/22/19   Madelin Rear, [facility] Pharmacist Pharmacist  03/19/21     Comment: Phone [phone]   Objective  Vitals: BP 127/82   Pulse 63   Temp 97.6 F (36.4 C) (Temporal)   Resp 16   Ht 6\' 1"  (1.854 m)   Wt 237 lb 6.4 oz (107.7 kg)   SpO2 96%   BMI 31.32 kg/m  General:  Well developed, well nourished, no acute distress  Psych:  Alert and orientedx3,normal mood and affect HEENT:  Normocephalic, atraumatic, non-icteric sclera, PERRL, oropharynx is clear without mass or exudate, supple neck without adenopathy, mass or thyromegaly Cardiovascular:  Normal S1, S2, RRR without gallop, rub or murmur, nondisplaced PMI, +2 distal pulses in bilateral upper and lower extremities. Respiratory:  Good breath sounds bilaterally, CTAB with normal respiratory effort Gastrointestinal: normal bowel sounds, soft, non-tender, no noted masses. No HSM MSK: no deformities, contusions. Joints are without erythema or swelling. Spine and CVA region are nontender Skin:  Warm, no rashes or suspicious lesions noted Neurologic:    Mental status is normal. CN 2-11 are normal. Gross motor and sensory exams are normal. Stable gait. No tremor    Assessment  1. Annual physical exam   2. Acquired hypothyroidism   3. Mixed hyperlipidemia   4. Leukopenia, unspecified type   5. Thrombocytopenia ([place])   6. SIADH (syndrome of inappropriate ADH production) ([place])   7. Male erectile disorder   8. History of hepatitis A      Plan  Male Wellness Visit: Age appropriate Health Maintenance and Prevention measures were discussed with patient. Included topics are cancer screening recommendations, ways to keep healthy (see AVS) including dietary and exercise recommendations, regular eye and  dental care, use of seat belts, and avoidance of moderate alcohol use and tobacco use.  BMI: discussed patient's BMI and encouraged positive lifestyle modifications to help get to or maintain a target BMI. HM needs and immunizations were addressed and ordered. See below for orders. See HM and immunization  section for updates. Routine labs and screening tests ordered including cmp, cbc and lipids where appropriate. Discussed recommendations regarding Vit D and calcium supplementation (see AVS)  Chronic disease f/u and/or acute problem visit: (deemed necessary to be done in addition to the wellness visit): Status post cholecystectomy for gallbladder polyps: Recovering well.  Recheck LFTs. History of hepatitis A: Recheck liver test.  Follows with GI.  Asymptomatic. Hyperlipidemia on statin.  Recheck LDL.  Fasting today.  Push LDL less than 100. ED: Stable on sildenafil, refilled Hypothyroidism: Clinically euthyroid.  Add alternating doses: See instructions. Monitor CBC with history of leukopenia and thrombocytopenia.   Follow up: 12 months for complete physical Commons side effects, risks, benefits, and alternatives for medications and treatment plan prescribed today were discussed, and the patient expressed understanding of the given instructions. Patient is instructed to call or message via MyChart if he/she has any questions or concerns regarding our treatment plan. No barriers to understanding were identified. We discussed Red Flag symptoms and signs in detail. Patient expressed understanding regarding what to do in case of urgent or emergency type symptoms.  Medication list was reconciled, printed and provided to the patient in AVS. Patient instructions and summary information was reviewed with the patient as documented in the AVS. This note was prepared with assistance of Dragon voice recognition software. Occasional wrong-word or sound-a-like substitutions may have occurred due to the inherent limitations of voice recognition software  This visit occurred during the SARS-CoV-2 public health emergency.  Safety protocols were in place, including screening questions prior to the visit, additional usage of staff PPE, and extensive cleaning of exam room while observing appropriate contact time as  indicated for disinfecting solutions.   Orders Placed This Encounter  Procedures   CBC with Differential/Platelet   Basic metabolic panel   Hepatic function panel   Lipid panel   TSH   Meds ordered this encounter  Medications   sildenafil (REVATIO) 20 MG tablet    Sig: Use 5 tablets PO as needed    Dispense:  90 tablet    Refill:  3   levothyroxine (SYNTHROID) 137 MCG tablet    Sig: Take 1 tabTues, Thurs, Sat and Sun    Dispense:  90 tablet    Refill:  3   levothyroxine (SYNTHROID) 150 MCG tablet    Sig: Take 1 tablet (150 mcg) by mouth Monday, Wednesday, and Friday    Dispense:  90 tablet    Refill:  3

## 2021-05-20 DIAGNOSIS — H40011 Open angle with borderline findings, low risk, right eye: Secondary | ICD-10-CM | POA: Diagnosis not present

## 2021-05-20 DIAGNOSIS — H2513 Age-related nuclear cataract, bilateral: Secondary | ICD-10-CM | POA: Diagnosis not present

## 2021-05-20 DIAGNOSIS — H43811 Vitreous degeneration, right eye: Secondary | ICD-10-CM | POA: Diagnosis not present

## 2021-05-22 ENCOUNTER — Other Ambulatory Visit: Payer: Self-pay | Admitting: Family Medicine

## 2021-05-22 ENCOUNTER — Other Ambulatory Visit: Payer: Self-pay

## 2021-05-22 DIAGNOSIS — E039 Hypothyroidism, unspecified: Secondary | ICD-10-CM

## 2021-05-22 MED ORDER — LEVOTHYROXINE SODIUM 137 MCG PO TABS: ORAL_TABLET | ORAL | 3 refills | Status: DC

## 2021-05-22 MED ORDER — LEVOTHYROXINE SODIUM 150 MCG PO TABS: ORAL_TABLET | ORAL | 3 refills | Status: DC

## 2021-07-13 ENCOUNTER — Other Ambulatory Visit: Payer: Self-pay

## 2021-07-13 ENCOUNTER — Other Ambulatory Visit (INDEPENDENT_AMBULATORY_CARE_PROVIDER_SITE_OTHER): Payer: Medicare Other

## 2021-07-13 DIAGNOSIS — E039 Hypothyroidism, unspecified: Secondary | ICD-10-CM

## 2021-07-14 LAB — TSH: TSH: 1.48 u[IU]/mL (ref 0.35–5.50)

## 2021-08-11 ENCOUNTER — Other Ambulatory Visit: Payer: Self-pay | Admitting: Family Medicine

## 2021-08-12 ENCOUNTER — Other Ambulatory Visit: Payer: Self-pay | Admitting: Family Medicine

## 2021-08-19 ENCOUNTER — Telehealth: Payer: Self-pay

## 2021-08-19 NOTE — Progress Notes (Signed)
    Chronic Care Management Pharmacy Assistant   Name: Dellon Revette  MRN: [id number] DOB: July 31, 1946   Reason for Encounter: Reschedule Initial CCM Visit   Called patient to reschedule initial CCM visit. Left message asking patient to return call to schedule.  Jobe Gibbon, [facility] Pharmacist Assistant  [phone]  Time Spent: 5 minutes

## 2021-09-04 ENCOUNTER — Encounter: Payer: Self-pay | Admitting: Family Medicine

## 2021-09-04 ENCOUNTER — Ambulatory Visit (INDEPENDENT_AMBULATORY_CARE_PROVIDER_SITE_OTHER): Payer: Medicare Other | Admitting: *Deleted

## 2021-09-04 ENCOUNTER — Other Ambulatory Visit: Payer: Self-pay

## 2021-09-04 DIAGNOSIS — Z23 Encounter for immunization: Secondary | ICD-10-CM | POA: Diagnosis not present

## 2021-09-23 ENCOUNTER — Other Ambulatory Visit: Payer: Self-pay | Admitting: Family Medicine

## 2021-10-01 ENCOUNTER — Ambulatory Visit: Payer: Medicare Other

## 2021-12-08 DIAGNOSIS — H40011 Open angle with borderline findings, low risk, right eye: Secondary | ICD-10-CM | POA: Diagnosis not present

## 2021-12-15 ENCOUNTER — Encounter: Payer: Self-pay | Admitting: Family Medicine

## 2022-01-13 ENCOUNTER — Inpatient Hospital Stay: Payer: Medicare Other | Attending: Hematology & Oncology

## 2022-01-13 ENCOUNTER — Encounter: Payer: Self-pay | Admitting: Hematology & Oncology

## 2022-01-13 ENCOUNTER — Inpatient Hospital Stay: Payer: Medicare Other | Admitting: Hematology & Oncology

## 2022-01-13 ENCOUNTER — Other Ambulatory Visit: Payer: Self-pay

## 2022-01-13 VITALS — BP 107/68 | HR 62 | Temp 97.9°F | Resp 18 | Ht 73.0 in | Wt 240.1 lb

## 2022-01-13 DIAGNOSIS — D696 Thrombocytopenia, unspecified: Secondary | ICD-10-CM | POA: Diagnosis not present

## 2022-01-13 DIAGNOSIS — R1012 Left upper quadrant pain: Secondary | ICD-10-CM | POA: Diagnosis not present

## 2022-01-13 LAB — CBC WITH DIFFERENTIAL (CANCER CENTER ONLY)
Abs Immature Granulocytes: 0.02 K/uL (ref 0.00–0.07)
Basophils Absolute: 0 K/uL (ref 0.0–0.1)
Basophils Relative: 1 %
Eosinophils Absolute: 0.1 K/uL (ref 0.0–0.5)
Eosinophils Relative: 3 %
HCT: 40.3 % (ref 39.0–52.0)
Hemoglobin: 14 g/dL (ref 13.0–17.0)
Immature Granulocytes: 1 %
Lymphocytes Relative: 28 %
Lymphs Abs: 1.1 K/uL (ref 0.7–4.0)
MCH: 34 pg (ref 26.0–34.0)
MCHC: 34.7 g/dL (ref 30.0–36.0)
MCV: 97.8 fL (ref 80.0–100.0)
Monocytes Absolute: 0.5 K/uL (ref 0.1–1.0)
Monocytes Relative: 12 %
Neutro Abs: 2.2 K/uL (ref 1.7–7.7)
Neutrophils Relative %: 55 %
Platelet Count: 128 K/uL — ABNORMAL LOW (ref 150–400)
RBC: 4.12 MIL/uL — ABNORMAL LOW (ref 4.22–5.81)
RDW: 13 % (ref 11.5–15.5)
WBC Count: 3.9 K/uL — ABNORMAL LOW (ref 4.0–10.5)
nRBC: 0 % (ref 0.0–0.2)

## 2022-01-13 LAB — CMP (CANCER CENTER ONLY)
ALT: 35 U/L (ref 0–44)
AST: 36 U/L (ref 15–41)
Albumin: 4.3 g/dL (ref 3.5–5.0)
Alkaline Phosphatase: 84 U/L (ref 38–126)
Anion gap: 7 (ref 5–15)
BUN: 12 mg/dL (ref 8–23)
CO2: 27 mmol/L (ref 22–32)
Calcium: 10 mg/dL (ref 8.9–10.3)
Chloride: 102 mmol/L (ref 98–111)
Creatinine: 1.1 mg/dL (ref 0.61–1.24)
GFR, Estimated: >60 mL/min (ref >60)
Glucose, Bld: 108 mg/dL — ABNORMAL HIGH (ref 70–99)
Potassium: 4.8 mmol/L (ref 3.5–5.1)
Sodium: 136 mmol/L (ref 135–145)
Total Bilirubin: 0.8 mg/dL (ref 0.3–1.2)
Total Protein: 7.2 g/dL (ref 6.5–8.1)

## 2022-01-13 LAB — SAVE SMEAR(SSMR), FOR PROVIDER SLIDE REVIEW

## 2022-01-13 NOTE — Progress Notes (Signed)
Hematology and Oncology Follow Up Visit  Timothy Grant [id number] 1945/12/18 76 y.o. 01/13/2022   Principle Diagnosis:   Chronic thrombocytopenia-likely cirrhosis/splenomegaly  Current Therapy:   Observation     Interim History:  Timothy Grant is back for follow-up.  So far, everything is going okay for him.  We last saw him back in June.  He has had no problems with bleeding.  He has had no issues with COVID.  There is no change in bowel or bladder habits.  On occasion, he does have a sharp pain overall in the left upper quadrant.  This is rare.  Is not associated with any nausea or vomiting.  He has had no leg swelling.  There is been no change in medications since we last saw him.  Overall, I would say his performance status is ECOG 0.     Medications:  Current Outpatient Medications:    Cholecalciferol (VITAMIN D3) 125 MCG (5000 UT) CAPS, Take 5,000 Units by mouth daily., Disp: , Rfl:    cyclobenzaprine (FLEXERIL) 10 MG tablet, Take 1 tablet (10 mg total) by mouth 3 (three) times daily as needed for muscle spasms., Disp: 90 tablet, Rfl: 1   folic acid (FOLVITE) 1 MG tablet, Take 1 tablet (1 mg total) by mouth daily., Disp: 100 tablet, Rfl: 3   Glucosamine-Chondroit-Vit C-Mn (GLUCOSAMINE 1500 COMPLEX PO), Take 2,000 mg by mouth daily., Disp: , Rfl:    ibuprofen (ADVIL) 200 MG tablet, Take 200 mg by mouth every 6 (six) hours as needed for mild pain., Disp: , Rfl:    levothyroxine (SYNTHROID) 137 MCG tablet, TAKE 1 TABLET BY MOUTH DAILY BEFORE BREAKFAST, Disp: 90 tablet, Rfl: 3   levothyroxine (SYNTHROID) 150 MCG tablet, TAKE 1 TABLET BY MOUTH ON MONDAY-WEDNESDAY- FRIDAY ONLY, Disp: 39 tablet, Rfl: 3   loratadine (CLARITIN) 10 MG tablet, Take 10 mg by mouth daily as needed for allergies., Disp: , Rfl:    Multiple Vitamin (MULTIVITAMIN) capsule, Take 1 capsule by mouth daily. , Disp: , Rfl:    Omega-3 Fatty Acids (FISH OIL) 1200 MG CAPS, Take 1,200 mg by mouth daily., Disp:  , Rfl:    rosuvastatin (CRESTOR) 10 MG tablet, TAKE ONE TABLET BY MOUTH DAILY, Disp: 90 tablet, Rfl: 3   sildenafil (REVATIO) 20 MG tablet, Use 5 tablets PO as needed, Disp: 90 tablet, Rfl: 3  Allergies: No Active Allergies  Past Medical History, Surgical history, Social history, and Family History were reviewed and updated.  Review of Systems: Review of Systems  Constitutional: Negative.   HENT:  Negative.    Eyes: Negative.   Respiratory: Negative.    Cardiovascular: Negative.   Gastrointestinal: Negative.   Endocrine: Negative.   Genitourinary: Negative.    Musculoskeletal: Negative.   Skin: Negative.   Neurological: Negative.   Hematological: Negative.   Psychiatric/Behavioral: Negative.     Physical Exam:  height is 6\' 1"  (1.854 m) and weight is 240 lb 1.9 oz (108.9 kg). His oral temperature is 97.9 F (36.6 C). His blood pressure is 107/68 and his pulse is 62. His respiration is 18 and oxygen saturation is 100%.   Wt Readings from Last 3 Encounters:  01/13/22 240 lb 1.9 oz (108.9 kg)  05/18/21 237 lb 6.4 oz (107.7 kg)  01/30/21 234 lb 9.6 oz (106.4 kg)    Physical Exam Vitals reviewed.  HENT:     Head: Normocephalic and atraumatic.  Eyes:     Pupils: Pupils are equal, round, and reactive to light.  Cardiovascular:     Rate and Rhythm: Normal rate and regular rhythm.     Heart sounds: Normal heart sounds.  Pulmonary:     Effort: Pulmonary effort is normal.     Breath sounds: Normal breath sounds.  Abdominal:     General: Bowel sounds are normal.     Palpations: Abdomen is soft.  Musculoskeletal:        General: No tenderness or deformity. Normal range of motion.     Cervical back: Normal range of motion.  Lymphadenopathy:     Cervical: No cervical adenopathy.  Skin:    General: Skin is warm and dry.     Findings: No erythema or rash.  Neurological:     Mental Status: He is alert and oriented to person, place, and time.  Psychiatric:        Behavior:  Behavior normal.        Thought Content: Thought content normal.        Judgment: Judgment normal.     Lab Results  Component Value Date   WBC 3.9 (L) 01/13/2022   HGB 14.0 01/13/2022   HCT 40.3 01/13/2022   MCV 97.8 01/13/2022   PLT 128 (L) 01/13/2022     Chemistry      Component Value Date/Time   NA 136 01/13/2022 0917   K 4.8 01/13/2022 0917   CL 102 01/13/2022 0917   CO2 27 01/13/2022 0917   BUN 12 01/13/2022 0917   CREATININE 1.10 01/13/2022 0917   CREATININE 1.02 11/17/2020 0848      Component Value Date/Time   CALCIUM 10.0 01/13/2022 0917   ALKPHOS 84 01/13/2022 0917   AST 36 01/13/2022 0917   ALT 35 01/13/2022 0917   BILITOT 0.8 01/13/2022 0917      Impression and Plan: Timothy Grant is a very nice 76 year old white male.  He has mild thrombocytopenia.  His platelet count continues to improve.  As such, I would all think we have to get him back except for 1 year.  I think this would be very reasonable.  I do not see any problems with him having his surgery.  Again, his platelet count is doing fine.  I looked at his blood under the microscope.  He has several large platelets which are encouraging.   Volanda Napoleon, MD 2/8/202310:34 AM

## 2022-01-15 ENCOUNTER — Other Ambulatory Visit: Payer: Self-pay

## 2022-01-15 ENCOUNTER — Encounter: Payer: Self-pay | Admitting: Family Medicine

## 2022-01-15 DIAGNOSIS — E039 Hypothyroidism, unspecified: Secondary | ICD-10-CM

## 2022-01-15 MED ORDER — LEVOTHYROXINE SODIUM 137 MCG PO TABS
ORAL_TABLET | ORAL | 3 refills | Status: DC
Start: 2022-01-15 — End: 2023-02-11

## 2022-01-15 MED ORDER — LEVOTHYROXINE SODIUM 150 MCG PO TABS
ORAL_TABLET | ORAL | 3 refills | Status: DC
Start: 2022-01-15 — End: 2022-09-24

## 2022-01-15 NOTE — Telephone Encounter (Signed)
Spoke with patient, Corrected days were put in on his medication and sent into his pharmacy.

## 2022-01-15 NOTE — Telephone Encounter (Signed)
LVM for patient to call back. ?

## 2022-01-28 DIAGNOSIS — L821 Other seborrheic keratosis: Secondary | ICD-10-CM | POA: Diagnosis not present

## 2022-01-28 DIAGNOSIS — Z85828 Personal history of other malignant neoplasm of skin: Secondary | ICD-10-CM | POA: Diagnosis not present

## 2022-01-28 DIAGNOSIS — D485 Neoplasm of uncertain behavior of skin: Secondary | ICD-10-CM | POA: Diagnosis not present

## 2022-01-28 DIAGNOSIS — D225 Melanocytic nevi of trunk: Secondary | ICD-10-CM | POA: Diagnosis not present

## 2022-01-28 DIAGNOSIS — Z08 Encounter for follow-up examination after completed treatment for malignant neoplasm: Secondary | ICD-10-CM | POA: Diagnosis not present

## 2022-01-28 DIAGNOSIS — L814 Other melanin hyperpigmentation: Secondary | ICD-10-CM | POA: Diagnosis not present

## 2022-01-28 DIAGNOSIS — L718 Other rosacea: Secondary | ICD-10-CM | POA: Diagnosis not present

## 2022-01-28 DIAGNOSIS — L570 Actinic keratosis: Secondary | ICD-10-CM | POA: Diagnosis not present

## 2022-02-11 ENCOUNTER — Encounter: Payer: Self-pay | Admitting: Family Medicine

## 2022-02-11 ENCOUNTER — Telehealth (INDEPENDENT_AMBULATORY_CARE_PROVIDER_SITE_OTHER): Payer: Medicare Other | Admitting: Family Medicine

## 2022-02-11 ENCOUNTER — Other Ambulatory Visit: Payer: Self-pay

## 2022-02-11 DIAGNOSIS — U071 COVID-19: Secondary | ICD-10-CM

## 2022-02-11 MED ORDER — MOLNUPIRAVIR EUA 200MG CAPSULE: 4.0000 | ORAL_CAPSULE | Freq: Two times a day (BID) | ORAL | 0 refills | Status: AC

## 2022-02-11 NOTE — Patient Instructions (Signed)
Meds have been sent the the pharmacy °You can take tylenol for pain/fevers °If worsening symptoms, let us know or go to the Emergency room  ° ° °

## 2022-02-11 NOTE — Progress Notes (Signed)
? ? ?MyChart Video Visit ? ? ? ?Virtual Visit via Video Note  ? ?This visit type was conducted due to national recommendations for restrictions regarding the COVID-19 Pandemic (e.g. social distancing) in an effort to limit this patient's exposure and mitigate transmission in our community. This patient is at least at moderate risk for complications without adequate follow up. This format is felt to be most appropriate for this patient at this time. Physical exam was limited by quality of the video and audio technology used for the visit. CMA was able to get the patient set up on a video visit. ? ?Patient location: Home. Patient and provider in visit ?Provider location: Office ? ?I discussed the limitations of evaluation and management by telemedicine and the availability of in person appointments. The patient expressed understanding and agreed to proceed. ? ?Visit Date: 02/11/2022 ? ?Today's healthcare provider: Wellington Hampshire, MD  ? ? ? ?Subjective:  ? ? Patient ID: Timothy Grant, male    DOB: 04-18-1946, 76 y.o.   MRN: [id number] ? ?Chief Complaint  ?Patient presents with  ? Generalized Body Aches  ? Headache  ? Cough  ? Nasal Congestion  ? Fever  ?  Tested positive today, wife tested positive Sunday ?Sx started 2 days ago ?Has not taking any OTC medications  ? ? ?HPI ?Chief complaint: covid ?Symptom onset: 2 days ?Pertinent positives: achey, HA, cough, congestion, fevers. Tested + today for Covid ?Pertinent negatives: no sob ?Treatments tried: none ?Vaccine status: UTD ?Sick exposure: wife + Sunday  ? ?Past Medical History:  ?Diagnosis Date  ? Adenomatous polyps   ? Allergy   ? seasonal  ? Arthritis   ? Cardiomegaly   ? ED (erectile dysfunction)   ? Gallbladder polyp   ? Glaucoma 03/2020  ? no tx as of yet  ? Hepatitis A   ? Hyperlipidemia   ? Hypothyroidism   ? Non-traumatic rhabdomyolysis 02/21/2019  ? Thrombocytopenia ([place])   ? Varicocele 02/01/2011  ? Left testicle  ? ? ?Past Surgical History:  ?Procedure  Laterality Date  ? CHOLECYSTECTOMY N/A 01/30/2021  ? Procedure: LAPAROSCOPIC CHOLECYSTECTOMY;  Surgeon: Dwan Bolt, MD;  Location: [facility];  Service: General;  Laterality: N/A;  ROOM 1 STARTING AT 01:00PM  ? COLONOSCOPY  2014  ? EYE SURGERY Right 1956  ? EYE SURGERY Left 1963  ? LIVER BIOPSY    ? POLYPECTOMY    ? TONSILLECTOMY    ? ? ?Outpatient Medications Prior to Visit  ?Medication Sig Dispense Refill  ? Cholecalciferol (VITAMIN D3) 125 MCG (5000 UT) CAPS Take 5,000 Units by mouth daily.    ? cyclobenzaprine (FLEXERIL) 10 MG tablet Take 1 tablet (10 mg total) by mouth 3 (three) times daily as needed for muscle spasms. 90 tablet 1  ? folic acid (FOLVITE) 1 MG tablet Take 1 tablet (1 mg total) by mouth daily. 100 tablet 3  ? Glucosamine-Chondroit-Vit C-Mn (GLUCOSAMINE 1500 COMPLEX PO) Take 2,000 mg by mouth daily.    ? ibuprofen (ADVIL) 200 MG tablet Take 200 mg by mouth every 6 (six) hours as needed for mild pain.    ? levothyroxine (SYNTHROID) 137 MCG tablet Take 1 tablet by mouth daily on Monday, Wednesday, Friday. 90 tablet 3  ? levothyroxine (SYNTHROID) 150 MCG tablet TAKE 1 TABLET BY MOUTH ON Tuesday, Thursday,Saturday, and  Sunday. 39 tablet 3  ? loratadine (CLARITIN) 10 MG tablet Take 10 mg by mouth daily as needed for allergies.    ? Multiple Vitamin (  MULTIVITAMIN) capsule Take 1 capsule by mouth daily.     ? Omega-3 Fatty Acids (FISH OIL) 1200 MG CAPS Take 1,200 mg by mouth daily.    ? rosuvastatin (CRESTOR) 10 MG tablet TAKE ONE TABLET BY MOUTH DAILY 90 tablet 3  ? sildenafil (REVATIO) 20 MG tablet Use 5 tablets PO as needed 90 tablet 3  ? ?No facility-administered medications prior to visit.  ? ? ?No Active Allergies ? ?On 5 FU ? ? ?   ?Objective:  ?  ? ?Physical Exam  ?Vitals and nursing note reviewed.  ?Constitutional:   ?   General:  is not in acute distress. ?   Appearance: Normal appearance.  ?HENT:  ?   Head: Normocephalic.  Nose red ?Pulmonary:  ?   Effort: No respiratory distress.  ?Skin: ?    General: Skin is dry.  ?   Coloration: Skin is not pale.  ?Neurological:  ?   Mental Status: Pt is alert and oriented to person, place, and time.  ?Psychiatric:     ?   Mood and Affect: Mood normal.  ? ?There were no vitals taken for this visit. ? ?Wt Readings from Last 3 Encounters:  ?01/13/22 240 lb 1.9 oz (108.9 kg)  ?05/18/21 237 lb 6.4 oz (107.7 kg)  ?01/30/21 234 lb 9.6 oz (106.4 kg)  ? ? ?   ?Assessment & Plan:  ? ?Problem List Items Addressed This Visit   ?None ?Visit Diagnoses   ? ? COVID-19    -  Primary  ? ?  ? Covid-pt prefers the molunipiravir.   ? ?No orders of the defined types were placed in this encounter. ? ? ?I discussed the assessment and treatment plan with the patient. The patient was provided an opportunity to ask questions and all were answered. The patient agreed with the plan and demonstrated an understanding of the instructions. ?  ?The patient was advised to call back or seek an in-person evaluation if the symptoms worsen or if the condition fails to improve as anticipated. ? ?I provided 15 minutes of face-to-face time during this encounter. ? ? ?Wellington Hampshire, MD ?[facility] ?[phone] (phone) ?[phone] (fax) ? ?[facility]  ?

## 2022-03-11 DIAGNOSIS — Z09 Encounter for follow-up examination after completed treatment for conditions other than malignant neoplasm: Secondary | ICD-10-CM | POA: Diagnosis not present

## 2022-03-11 DIAGNOSIS — L570 Actinic keratosis: Secondary | ICD-10-CM | POA: Diagnosis not present

## 2022-03-11 DIAGNOSIS — L814 Other melanin hyperpigmentation: Secondary | ICD-10-CM | POA: Diagnosis not present

## 2022-03-23 ENCOUNTER — Ambulatory Visit (INDEPENDENT_AMBULATORY_CARE_PROVIDER_SITE_OTHER): Payer: Medicare Other

## 2022-03-23 DIAGNOSIS — Z0000 Encounter for general adult medical examination without abnormal findings: Secondary | ICD-10-CM

## 2022-03-23 NOTE — Progress Notes (Signed)
Virtual Visit via Telephone Note ? ?I connected with  Timothy Grant on 03/23/22 at 11:45 AM EDT by telephone and verified that I am speaking with the correct person using two identifiers. ? ?Medicare Annual Wellness visit completed telephonically due to Covid-19 pandemic.  ? ?Persons participating in this call: This Health Coach and this patient.  ? ?Location: ?Patient: Home ?Provider: Office ?  ?I discussed the limitations, risks, security and privacy concerns of performing an evaluation and management service by telephone and the availability of in person appointments. The patient expressed understanding and agreed to proceed. ? ?Unable to perform video visit due to video visit attempted and failed and/or patient does not have video capability.  ? ?Some vital signs may be absent or patient reported.  ? ?Willette Brace, LPN ? ? ?Subjective:  ? Timothy Grant is a 76 y.o. male who presents for Medicare Annual/Subsequent preventive examination. ? ?Review of Systems    ? ?Cardiac Risk Factors include: advanced age (>48mn, >>45women);obesity (BMI >30kg/m2);male gender ? ?   ?Objective:  ?  ?There were no vitals filed for this visit. ?There is no height or weight on file to calculate BMI. ? ? ?  03/23/2022  ? 11:51 AM 01/13/2022  ?  9:51 AM 01/23/2021  ? 11:11 AM 01/13/2021  ?  9:42 AM 09/25/2020  ? 10:51 AM 01/15/2020  ? 11:14 AM 08/22/2019  ? 10:35 AM  ?Advanced Directives  ?Does Patient Have a Medical Advance Directive? Yes Yes Yes Yes Yes Yes Yes  ?Type of Advance Directive Living will Living will;Healthcare Power of [place] Living will;Healthcare Power of Attorney  ?Does patient want to make changes to medical advance directive?  No - Patient declined  No - Patient declined  No - Patient declined No - Patient declined  ?Copy of [place]?  No - copy requested No - copy requested No - copy requested No - copy requested No - copy requested No - copy requested  ? ? ?Current Medications (verified) ?Outpatient Encounter Medications as of 03/23/2022  ?Medication Sig  ? Cholecalciferol (VITAMIN D3) 125 MCG (5000 UT) CAPS Take 5,000 Units by mouth daily.  ? cyclobenzaprine (FLEXERIL) 10 MG tablet Take 1 tablet (10 mg total) by mouth 3 (three) times daily as needed for muscle spasms.  ? folic acid (FOLVITE) 1 MG tablet Take 1 tablet (1 mg total) by mouth daily.  ? Glucosamine-Chondroit-Vit C-Mn (GLUCOSAMINE 1500 COMPLEX PO) Take 2,000 mg by mouth daily.  ? ibuprofen (ADVIL) 200 MG tablet Take 200 mg by mouth every 6 (six) hours as needed for mild pain.  ? levothyroxine (SYNTHROID) 137 MCG tablet Take 1 tablet by mouth daily on Monday, Wednesday, Friday.  ? levothyroxine (SYNTHROID) 150 MCG tablet TAKE 1 TABLET BY MOUTH ON Tuesday, Thursday,Saturday, and  Sunday.  ? loratadine (CLARITIN) 10 MG tablet Take 10 mg by mouth daily as needed for allergies.  ? Multiple Vitamin (MULTIVITAMIN) capsule Take 1 capsule by mouth daily.   ? Omega-3 Fatty Acids (FISH OIL) 1200 MG CAPS Take 1,200 mg by mouth daily.  ? rosuvastatin (CRESTOR) 10 MG tablet TAKE ONE TABLET BY MOUTH DAILY  ? sildenafil (REVATIO) 20 MG tablet Use 5 tablets PO as needed  ? MODERNA COVID-19 BIVAL BOOSTER 50 MCG/0.5ML injection   ? ?No facility-administered encounter medications on file as of 03/23/2022.  ? ? ?  Allergies (verified) ?Patient has no known allergies.  ? ?History: ?Past Medical History:  ?Diagnosis Date  ? Adenomatous polyps   ? Allergy   ? seasonal  ? Arthritis   ? Cardiomegaly   ? ED (erectile dysfunction)   ? Gallbladder polyp   ? Glaucoma 03/2020  ? no tx as of yet  ? Hepatitis A   ? Hyperlipidemia   ? Hypothyroidism   ? Non-traumatic rhabdomyolysis 02/21/2019  ? Thrombocytopenia ([place])   ? Varicocele 02/01/2011  ? Left testicle  ? ?Past Surgical History:  ?Procedure  Laterality Date  ? CHOLECYSTECTOMY N/A 01/30/2021  ? Procedure: LAPAROSCOPIC CHOLECYSTECTOMY;  Surgeon: Dwan Bolt, MD;  Location: [facility];  Service: General;  Laterality: N/A;  ROOM 1 STARTING AT 01:00PM  ? COLONOSCOPY  2014  ? EYE SURGERY Right 1956  ? EYE SURGERY Left 1963  ? LIVER BIOPSY    ? POLYPECTOMY    ? TONSILLECTOMY    ? ?Family History  ?Problem Relation Age of Onset  ? Alzheimer's disease Mother   ? Arthritis Mother   ? Dementia Mother   ? Stroke Mother   ? Heart disease Father   ? Cancer Sister   ?     uterine  ? Hyperlipidemia Brother   ? Stroke Brother   ? Cancer Brother   ? Colon polyps Brother   ? Heart disease Paternal Grandmother   ? Colon cancer Neg Hx   ? Esophageal cancer Neg Hx   ? Rectal cancer Neg Hx   ? Stomach cancer Neg Hx   ? ?Social History  ? ?Socioeconomic History  ? Marital status: Married  ?  Spouse name: Not on file  ? Number of children: Not on file  ? Years of education: Not on file  ? Highest education level: Not on file  ?Occupational History  ? Occupation: retired  ?Tobacco Use  ? Smoking status: Former  ?  Years: 10.00  ?  Types: Cigarettes  ? Smokeless tobacco: Never  ? Tobacco comments:  ?  quit 1975  ?Vaping Use  ? Vaping Use: Never used  ?Substance and Sexual Activity  ? Alcohol use: Not Currently  ?  Alcohol/week: 6.0 standard drinks  ?  Types: 6 Cans of beer per week  ? Drug use: No  ? Sexual activity: Yes  ?Other Topics Concern  ? Not on file  ?Social History Narrative  ? Not on file  ? ?Social Determinants of Health  ? ?Financial Resource Strain: Low Risk   ? Difficulty of Paying Living Expenses: Not hard at all  ?Food Insecurity: No Food Insecurity  ? Worried About Charity fundraiser in the Last Year: Never true  ? Ran Out of Food in the Last Year: Never true  ?Transportation Needs: No Transportation Needs  ? Lack of Transportation (Medical): No  ? Lack of Transportation (Non-Medical): No  ?Physical Activity: Sufficiently Active  ? Days of Exercise per Week: 7  days  ? Minutes of Exercise per Session: 40 min  ?Stress: No Stress Concern Present  ? Feeling of Stress : Not at all  ?Social Connections: Moderately Isolated  ? Frequency of Communication with Friends and Family: More than three times a week  ? Frequency of Social Gatherings with Friends and Family: More than three times a week  ? Attends Religious Services: Never  ? Active Member of Clubs or Organizations: No  ? Attends Archivist Meetings: Never  ? Marital Status: Married  ? ? ?  Tobacco Counseling ?Counseling given: Not Answered ?Tobacco comments: quit 1975 ? ? ?Clinical Intake: ? ?Pre-visit preparation completed: Yes ? ?Pain : No/denies pain ? ?  ? ?BMI - recorded: 31.69 ?Nutritional Status: BMI > 30  Obese ?Nutritional Risks: None ?Diabetes: No ? ?How often do you need to have someone help you when you read instructions, pamphlets, or other written materials from your doctor or pharmacy?: 1 - Never ? ?Diabetic?no ? ?Interpreter Needed?: No ? ?Information entered by :: Charlott Rakes, LPN ? ? ?Activities of Daily Living ? ?  03/23/2022  ? 11:53 AM 05/18/2021  ?  9:00 AM  ?In your present state of health, do you have any difficulty performing the following activities:  ?Hearing? 0 0  ?Vision? 0 0  ?Difficulty concentrating or making decisions? 0 0  ?Walking or climbing stairs? 0 0  ?Dressing or bathing? 0 0  ?Doing errands, shopping? 0 0  ?Preparing Food and eating ? N   ?Using the Toilet? N   ?In the past six months, have you accidently leaked urine? N   ?Do you have problems with loss of bowel control? N   ?Managing your Medications? N   ?Managing your Finances? N   ?Housekeeping or managing your Housekeeping? N   ? ? ?Patient Care Team: ?[name], MD as PCP - General (Family Medicine) ?Stephannie Li, OD (Ophthalmology) ?Daryll Brod, MD as Consulting Physician (Orthopedic Surgery) ?[place] (Dentistry) ?Juanita Craver, MD as Consulting Physician (Gastroenterology) ?Thornton Park, MD as  Consulting Physician (Gastroenterology) ?Tish Men, MD (Inactive) as Consulting Physician (Hematology) ?Madelin Rear, [facility] as Pharmacist (Pharmacist) ? ?Indicate any recent Medical Services you may have received fro

## 2022-03-23 NOTE — Patient Instructions (Signed)
[name] , ?Thank you for taking time to come for your Medicare Wellness Visit. I appreciate your ongoing commitment to your health goals. Please review the following plan we discussed and let me know if I can assist you in the future.  ? ?Screening recommendations/referrals: ?Colonoscopy: Done 05/14/20 repeat every 3 years  ?Recommended yearly ophthalmology/optometry visit for glaucoma screening and checkup ?Recommended yearly dental visit for hygiene and checkup ? ?Vaccinations: ?Influenza vaccine: Done 09/04/21 repeat every year  ?Pneumococcal vaccine: Up to date ?Tdap vaccine: Done  ?Shingles vaccine: Completed 8/10 & 09/11/18 ?Covid-19: Completed 4/1, 4/29, 09/30/20 & 05/14/21 , 12/10/21 ? ?Advanced directives: Please bring a copy of your health care power of attorney and living will to the office at your convenience. ? ?Conditions/risks identified: none at this Time  ? ?Next appointment: Follow up in one year for your annual wellness visit.  ? ?Preventive Care 68 Years and Older, Male ?Preventive care refers to lifestyle choices and visits with your health care provider that can promote health and wellness. ?What does preventive care include? ?A yearly physical exam. This is also called an annual well check. ?Dental exams once or twice a year. ?Routine eye exams. Ask your health care provider how often you should have your eyes checked. ?Personal lifestyle choices, including: ?Daily care of your teeth and gums. ?Regular physical activity. ?Eating a healthy diet. ?Avoiding tobacco and drug use. ?Limiting alcohol use. ?Practicing safe sex. ?Taking low doses of aspirin every day. ?Taking vitamin and mineral supplements as recommended by your health care provider. ?What happens during an annual well check? ?The services and screenings done by your health care provider during your annual well check will depend on your age, overall health, lifestyle risk factors, and family history of disease. ?Counseling  ?Your health  care provider may ask you questions about your: ?Alcohol use. ?Tobacco use. ?Drug use. ?Emotional well-being. ?Home and relationship well-being. ?Sexual activity. ?Eating habits. ?History of falls. ?Memory and ability to understand (cognition). ?Work and work Statistician. ?Screening  ?You may have the following tests or measurements: ?Height, weight, and BMI. ?Blood pressure. ?Lipid and cholesterol levels. These may be checked every 5 years, or more frequently if you are over 35 years old. ?Skin check. ?Lung cancer screening. You may have this screening every year starting at age 28 if you have a 30-pack-year history of smoking and currently smoke or have quit within the past 15 years. ?Fecal occult blood test (FOBT) of the stool. You may have this test every year starting at age 23. ?Flexible sigmoidoscopy or colonoscopy. You may have a sigmoidoscopy every 5 years or a colonoscopy every 10 years starting at age 64. ?Prostate cancer screening. Recommendations will vary depending on your family history and other risks. ?Hepatitis C blood test. ?Hepatitis B blood test. ?Sexually transmitted disease (STD) testing. ?Diabetes screening. This is done by checking your blood sugar (glucose) after you have not eaten for a while (fasting). You may have this done every 1-3 years. ?Abdominal aortic aneurysm (AAA) screening. You may need this if you are a current or former smoker. ?Osteoporosis. You may be screened starting at age 12 if you are at high risk. ?Talk with your health care provider about your test results, treatment options, and if necessary, the need for more tests. ?Vaccines  ?Your health care provider may recommend certain vaccines, such as: ?Influenza vaccine. This is recommended every year. ?Tetanus, diphtheria, and acellular pertussis (Tdap, Td) vaccine. You may need a Td booster every 10  years. ?Zoster vaccine. You may need this after age 34. ?Pneumococcal 13-valent conjugate (PCV13) vaccine. One dose is  recommended after age 57. ?Pneumococcal polysaccharide (PPSV23) vaccine. One dose is recommended after age 43. ?Talk to your health care provider about which screenings and vaccines you need and how often you need them. ?This information is not intended to replace advice given to you by your health care provider. Make sure you discuss any questions you have with your health care provider. ?Document Released: 12/19/2015 Document Revised: 08/11/2016 Document Reviewed: 09/23/2015 ?Elsevier Interactive Patient Education ? 2017 [place]. ? ?Fall Prevention in the Home ?Falls can cause injuries. They can happen to people of all ages. There are many things you can do to make your home safe and to help prevent falls. ?What can I do on the outside of my home? ?Regularly fix the edges of walkways and driveways and fix any cracks. ?Remove anything that might make you trip as you walk through a door, such as a raised step or threshold. ?Trim any bushes or trees on the path to your home. ?Use bright outdoor lighting. ?Clear any walking paths of anything that might make someone trip, such as rocks or tools. ?Regularly check to see if handrails are loose or broken. Make sure that both sides of any steps have handrails. ?Any raised decks and porches should have guardrails on the edges. ?Have any leaves, snow, or ice cleared regularly. ?Use sand or salt on walking paths during winter. ?Clean up any spills in your garage right away. This includes oil or grease spills. ?What can I do in the bathroom? ?Use night lights. ?Install grab bars by the toilet and in the tub and shower. Do not use towel bars as grab bars. ?Use non-skid mats or decals in the tub or shower. ?If you need to sit down in the shower, use a plastic, non-slip stool. ?Keep the floor dry. Clean up any water that spills on the floor as soon as it happens. ?Remove soap buildup in the tub or shower regularly. ?Attach bath mats securely with double-sided non-slip rug  tape. ?Do not have throw rugs and other things on the floor that can make you trip. ?What can I do in the bedroom? ?Use night lights. ?Make sure that you have a light by your bed that is easy to reach. ?Do not use any sheets or blankets that are too big for your bed. They should not hang down onto the floor. ?Have a firm chair that has side arms. You can use this for support while you get dressed. ?Do not have throw rugs and other things on the floor that can make you trip. ?What can I do in the kitchen? ?Clean up any spills right away. ?Avoid walking on wet floors. ?Keep items that you use a lot in easy-to-reach places. ?If you need to reach something above you, use a strong step stool that has a grab bar. ?Keep electrical cords out of the way. ?Do not use floor polish or wax that makes floors slippery. If you must use wax, use non-skid floor wax. ?Do not have throw rugs and other things on the floor that can make you trip. ?What can I do with my stairs? ?Do not leave any items on the stairs. ?Make sure that there are handrails on both sides of the stairs and use them. Fix handrails that are broken or loose. Make sure that handrails are as long as the stairways. ?Check any carpeting to make sure  that it is firmly attached to the stairs. Fix any carpet that is loose or worn. ?Avoid having throw rugs at the top or bottom of the stairs. If you do have throw rugs, attach them to the floor with carpet tape. ?Make sure that you have a light switch at the top of the stairs and the bottom of the stairs. If you do not have them, ask someone to add them for you. ?What else can I do to help prevent falls? ?Wear shoes that: ?Do not have high heels. ?Have rubber bottoms. ?Are comfortable and fit you well. ?Are closed at the toe. Do not wear sandals. ?If you use a stepladder: ?Make sure that it is fully opened. Do not climb a closed stepladder. ?Make sure that both sides of the stepladder are locked into place. ?Ask someone to  hold it for you, if possible. ?Clearly mark and make sure that you can see: ?Any grab bars or handrails. ?First and last steps. ?Where the edge of each step is. ?Use tools that help you move around (mobilit

## 2022-05-21 ENCOUNTER — Other Ambulatory Visit: Payer: Self-pay | Admitting: Family Medicine

## 2022-05-25 DIAGNOSIS — H43811 Vitreous degeneration, right eye: Secondary | ICD-10-CM | POA: Diagnosis not present

## 2022-05-25 DIAGNOSIS — H25013 Cortical age-related cataract, bilateral: Secondary | ICD-10-CM | POA: Diagnosis not present

## 2022-05-25 DIAGNOSIS — H2513 Age-related nuclear cataract, bilateral: Secondary | ICD-10-CM | POA: Diagnosis not present

## 2022-05-25 DIAGNOSIS — H40011 Open angle with borderline findings, low risk, right eye: Secondary | ICD-10-CM | POA: Diagnosis not present

## 2022-06-09 ENCOUNTER — Other Ambulatory Visit: Payer: Self-pay | Admitting: Family Medicine

## 2022-06-12 ENCOUNTER — Other Ambulatory Visit: Payer: Self-pay | Admitting: Family Medicine

## 2022-08-30 ENCOUNTER — Encounter: Payer: Self-pay | Admitting: *Deleted

## 2022-09-24 ENCOUNTER — Other Ambulatory Visit: Payer: Self-pay | Admitting: Family Medicine

## 2022-09-24 DIAGNOSIS — E039 Hypothyroidism, unspecified: Secondary | ICD-10-CM

## 2022-10-27 DIAGNOSIS — M1711 Unilateral primary osteoarthritis, right knee: Secondary | ICD-10-CM | POA: Diagnosis not present

## 2022-10-27 DIAGNOSIS — M25561 Pain in right knee: Secondary | ICD-10-CM | POA: Diagnosis not present

## 2022-11-12 ENCOUNTER — Other Ambulatory Visit: Payer: Self-pay | Admitting: Family Medicine

## 2022-11-15 ENCOUNTER — Other Ambulatory Visit: Payer: Self-pay | Admitting: Family Medicine

## 2022-11-18 ENCOUNTER — Encounter: Payer: Self-pay | Admitting: *Deleted

## 2022-11-24 ENCOUNTER — Other Ambulatory Visit: Payer: Self-pay | Admitting: Family Medicine

## 2022-11-24 DIAGNOSIS — E039 Hypothyroidism, unspecified: Secondary | ICD-10-CM

## 2022-11-24 DIAGNOSIS — M1711 Unilateral primary osteoarthritis, right knee: Secondary | ICD-10-CM | POA: Diagnosis not present

## 2022-12-09 ENCOUNTER — Other Ambulatory Visit: Payer: Self-pay | Admitting: Family Medicine

## 2023-01-05 DIAGNOSIS — M1711 Unilateral primary osteoarthritis, right knee: Secondary | ICD-10-CM | POA: Diagnosis not present

## 2023-01-13 ENCOUNTER — Encounter: Payer: Self-pay | Admitting: Medical Oncology

## 2023-01-13 ENCOUNTER — Inpatient Hospital Stay: Payer: Medicare Other | Attending: Hematology & Oncology

## 2023-01-13 ENCOUNTER — Inpatient Hospital Stay: Payer: Medicare Other | Admitting: Medical Oncology

## 2023-01-13 VITALS — BP 120/70 | HR 59 | Temp 98.0°F | Resp 16 | Wt 245.1 lb

## 2023-01-13 DIAGNOSIS — D696 Thrombocytopenia, unspecified: Secondary | ICD-10-CM | POA: Diagnosis not present

## 2023-01-13 DIAGNOSIS — Z79899 Other long term (current) drug therapy: Secondary | ICD-10-CM | POA: Insufficient documentation

## 2023-01-13 LAB — CBC WITH DIFFERENTIAL (CANCER CENTER ONLY)
Abs Immature Granulocytes: 0.01 10*3/uL (ref 0.00–0.07)
Basophils Absolute: 0 10*3/uL (ref 0.0–0.1)
Basophils Relative: 1 %
Eosinophils Absolute: 0.1 10*3/uL (ref 0.0–0.5)
Eosinophils Relative: 4 %
HCT: 38.4 % — ABNORMAL LOW (ref 39.0–52.0)
Hemoglobin: 13 g/dL (ref 13.0–17.0)
Immature Granulocytes: 0 %
Lymphocytes Relative: 26 %
Lymphs Abs: 0.8 10*3/uL (ref 0.7–4.0)
MCH: 33.6 pg (ref 26.0–34.0)
MCHC: 33.9 g/dL (ref 30.0–36.0)
MCV: 99.2 fL (ref 80.0–100.0)
Monocytes Absolute: 0.3 10*3/uL (ref 0.1–1.0)
Monocytes Relative: 11 %
Neutro Abs: 1.9 10*3/uL (ref 1.7–7.7)
Neutrophils Relative %: 58 %
Platelet Count: 114 10*3/uL — ABNORMAL LOW (ref 150–400)
RBC: 3.87 MIL/uL — ABNORMAL LOW (ref 4.22–5.81)
RDW: 13.1 % (ref 11.5–15.5)
WBC Count: 3.3 10*3/uL — ABNORMAL LOW (ref 4.0–10.5)
nRBC: 0 % (ref 0.0–0.2)

## 2023-01-13 LAB — SAVE SMEAR(SSMR), FOR PROVIDER SLIDE REVIEW

## 2023-01-13 NOTE — Progress Notes (Signed)
Hematology and Oncology Follow Up Visit  Timothy Grant [id number] 1946/01/10 78 y.o. 01/13/2023   Principle Diagnosis:   Chronic thrombocytopenia-likely cirrhosis/splenomegaly  Current Therapy:   Observation     Interim History:  Timothy Grant is back for follow-up.  He is seen yearly for observation.   Since his last visit he has been doing well. He golfs and feels well. No bleeding, unintentional weight loss, night sweats, bruising episodes or frequent infections.   There is no change in bowel or bladder habits.  He had his gallbladder removed early last summer. He is doing well since.   Overall, I would say his performance status is ECOG 0.    Wt Readings from Last 3 Encounters:  01/13/23 245 lb 1.9 oz (111.2 kg)  01/13/22 240 lb 1.9 oz (108.9 kg)  05/18/21 237 lb 6.4 oz (107.7 kg)    Medications:  Current Outpatient Medications:    diclofenac (VOLTAREN) 75 MG EC tablet, Take 75 mg by mouth 2 (two) times daily., Disp: , Rfl:    Cholecalciferol (VITAMIN D3) 125 MCG (5000 UT) CAPS, Take 5,000 Units by mouth daily., Disp: , Rfl:    cyclobenzaprine (FLEXERIL) 10 MG tablet, TAKE ONE TABLET BY MOUTH THREE TIMES A DAY AS NEEDED FOR MUSCLE SPASMS, Disp: 90 tablet, Rfl: 1   folic acid (FOLVITE) 1 MG tablet, TAKE 1 TABLET BY MOUTH DAILY, Disp: 90 tablet, Rfl: 1   Glucosamine-Chondroit-Vit C-Mn (GLUCOSAMINE 1500 COMPLEX PO), Take 2,000 mg by mouth daily., Disp: , Rfl:    ibuprofen (ADVIL) 200 MG tablet, Take 200 mg by mouth every 6 (six) hours as needed for mild pain., Disp: , Rfl:    levothyroxine (SYNTHROID) 137 MCG tablet, Take 1 tablet by mouth daily on Monday, Wednesday, Friday., Disp: 90 tablet, Rfl: 3   levothyroxine (SYNTHROID) 150 MCG tablet, TAKE 1 TABLET BY MOUTH ON TUESDAY, THURSDAY, SATURDAY AND SUNDAY, Disp: 39 tablet, Rfl: 0   loratadine (CLARITIN) 10 MG tablet, Take 10 mg by mouth daily as needed for allergies., Disp: , Rfl:    MODERNA COVID-19 BIVAL BOOSTER 50  MCG/0.5ML injection, , Disp: , Rfl:    Multiple Vitamin (MULTIVITAMIN) capsule, Take 1 capsule by mouth daily. , Disp: , Rfl:    Omega-3 Fatty Acids (FISH OIL) 1200 MG CAPS, Take 1,200 mg by mouth daily., Disp: , Rfl:    rosuvastatin (CRESTOR) 10 MG tablet, TAKE 1 TABLET BY MOUTH DAILY, Disp: 90 tablet, Rfl: 3   sildenafil (REVATIO) 20 MG tablet, Use 5 tablets PO as needed, Disp: 90 tablet, Rfl: 3  Allergies: No Known Allergies  Past Medical History, Surgical history, Social history, and Family History were reviewed and updated.  Review of Systems: Review of Systems  Constitutional: Negative.   HENT:  Negative.    Eyes: Negative.   Respiratory: Negative.    Cardiovascular: Negative.   Gastrointestinal: Negative.   Endocrine: Negative.   Genitourinary: Negative.    Musculoskeletal: Negative.   Skin: Negative.   Neurological: Negative.   Hematological: Negative.   Psychiatric/Behavioral: Negative.      Physical Exam:  weight is 245 lb 1.9 oz (111.2 kg). His oral temperature is 98 F (36.7 C). His blood pressure is 120/70 and his pulse is 59 (abnormal). His respiration is 16 and oxygen saturation is 100%.   Wt Readings from Last 3 Encounters:  01/13/23 245 lb 1.9 oz (111.2 kg)  01/13/22 240 lb 1.9 oz (108.9 kg)  05/18/21 237 lb 6.4 oz (107.7 kg)  Physical Exam Vitals reviewed.  HENT:     Head: Normocephalic and atraumatic.  Eyes:     Pupils: Pupils are equal, round, and reactive to light.  Cardiovascular:     Rate and Rhythm: Normal rate and regular rhythm.     Heart sounds: Normal heart sounds.  Pulmonary:     Effort: Pulmonary effort is normal.     Breath sounds: Normal breath sounds.  Abdominal:     General: Bowel sounds are normal.     Palpations: Abdomen is soft.  Musculoskeletal:        General: No tenderness or deformity. Normal range of motion.     Cervical back: Normal range of motion.  Lymphadenopathy:     Cervical: No cervical adenopathy.  Skin:     General: Skin is warm and dry.     Findings: No erythema or rash.  Neurological:     Mental Status: He is alert and oriented to person, place, and time.  Psychiatric:        Behavior: Behavior normal.        Thought Content: Thought content normal.        Judgment: Judgment normal.      Lab Results  Component Value Date   WBC 3.3 (L) 01/13/2023   HGB 13.0 01/13/2023   HCT 38.4 (L) 01/13/2023   MCV 99.2 01/13/2023   PLT 114 (L) 01/13/2023     Chemistry      Component Value Date/Time   NA 136 01/13/2022 0917   K 4.8 01/13/2022 0917   CL 102 01/13/2022 0917   CO2 27 01/13/2022 0917   BUN 12 01/13/2022 0917   CREATININE 1.10 01/13/2022 0917   CREATININE 1.02 11/17/2020 0848      Component Value Date/Time   CALCIUM 10.0 01/13/2022 0917   ALKPHOS 84 01/13/2022 0917   AST 36 01/13/2022 0917   ALT 35 01/13/2022 0917   BILITOT 0.8 01/13/2022 0917      Impression and Plan: Timothy Grant is a very nice 77 year old white male.  He has mild thrombocytopenia.  Overall his counts are lower level of his baseline. I so want to keep an eye on him a bit sonner than once yearly due to this. We will plan for RTC in 6 months; sooner should he noticed recurrent infections, unintentional weight loss, bruising/bleeding. If counts continue to trend down I would recommend a bone marrow biopsy to assess for any MDS.   Disposition RTC 6 months MD, labs   Timothy Grant, [place] 2/8/202410:25 AM

## 2023-01-30 ENCOUNTER — Other Ambulatory Visit: Payer: Self-pay | Admitting: Family Medicine

## 2023-01-30 DIAGNOSIS — E039 Hypothyroidism, unspecified: Secondary | ICD-10-CM

## 2023-01-31 NOTE — Telephone Encounter (Signed)
Please notify pt: he was last here with me in 08.2022. he is overdue for an appointment. Please have him schedule with me within the next 30 days. No further refills w/o office visit and thyroid recheck. thanks

## 2023-02-07 ENCOUNTER — Ambulatory Visit (INDEPENDENT_AMBULATORY_CARE_PROVIDER_SITE_OTHER): Payer: Medicare Other | Admitting: Family Medicine

## 2023-02-07 ENCOUNTER — Encounter: Payer: Self-pay | Admitting: Family Medicine

## 2023-02-07 VITALS — BP 128/70 | HR 74 | Temp 97.9°F | Ht 73.0 in | Wt 241.8 lb

## 2023-02-07 DIAGNOSIS — D72819 Decreased white blood cell count, unspecified: Secondary | ICD-10-CM | POA: Diagnosis not present

## 2023-02-07 DIAGNOSIS — E782 Mixed hyperlipidemia: Secondary | ICD-10-CM

## 2023-02-07 DIAGNOSIS — E039 Hypothyroidism, unspecified: Secondary | ICD-10-CM

## 2023-02-07 DIAGNOSIS — D696 Thrombocytopenia, unspecified: Secondary | ICD-10-CM | POA: Diagnosis not present

## 2023-02-07 DIAGNOSIS — D126 Benign neoplasm of colon, unspecified: Secondary | ICD-10-CM | POA: Diagnosis not present

## 2023-02-07 DIAGNOSIS — N529 Male erectile dysfunction, unspecified: Secondary | ICD-10-CM

## 2023-02-07 DIAGNOSIS — Z0000 Encounter for general adult medical examination without abnormal findings: Secondary | ICD-10-CM | POA: Diagnosis not present

## 2023-02-07 DIAGNOSIS — E222 Syndrome of inappropriate secretion of antidiuretic hormone: Secondary | ICD-10-CM

## 2023-02-07 NOTE — Patient Instructions (Signed)

## 2023-02-07 NOTE — Progress Notes (Signed)
Subjective  Chief Complaint  Patient presents with   Hypothyroidism    HPI: Timothy Grant is a 77 y.o. male who presents to [facility] today for a Male Wellness Visit. He also has the concerns and/or needs as listed above in the chief complaint. These will be addressed in addition to the Health Maintenance Visit.   Wellness Visit: annual visit with health maintenance review and exam   HM: last here 05/2021; doing very well. Screens: CRC due in June with Dr. Tarri Glenn, adenomatous polyps surveillance. Active and feeling well.   Body mass index is 31.9 kg/m. Wt Readings from Last 3 Encounters:  02/07/23 241 lb 12.8 oz (109.7 kg)  01/13/23 245 lb 1.9 oz (111.2 kg)  01/13/22 240 lb 1.9 oz (108.9 kg)     Chronic disease management visit and/or acute problem visit: Leukopenia and thrombocytopenia: reviewed hematology notes and labs. Stable. Possibly related to splenomegaly. Monitoring q 84moSIADH w/ h/o low sodium. No sxs. HLD on crestor 10. Would like full NMR lipid panel to ensure stable on lower dose H/o Hep A.  Low thyroid: clinically well. Compliant. Overdue for recheck.    Patient Active Problem List   Diagnosis Date Noted   History of hepatitis A 05/15/2020   SIADH (syndrome of inappropriate ADH production) ([place] 05/15/2020   Acquired hypothyroidism 02/05/2011   Adenomatous colon polyp 02/05/2011   Mixed hyperlipidemia 02/05/2011   Osteoarthritis of finger of left hand 01/07/2017   Leukopenia 08/09/2012   Thrombocytopenia ([place] 07/09/2019   Trigger finger, left ring finger 01/07/2017   Male erectile disorder 02/09/2012   Gallbladder polyp 11/17/2020   Health Maintenance  Topic Date Due   DTaP/Tdap/Td (2 - Td or Tdap) 02/04/2021   Medicare Annual Wellness (AWV)  03/24/2023   COVID-19 Vaccine (6 - 2023-24 season) 02/23/2023 (Originally 08/06/2022)   COLONOSCOPY (Pts 45-458yrInsurance coverage will need to be confirmed)  05/15/2023    Pneumonia Vaccine 6518Years old  Completed   INFLUENZA VACCINE  Completed   Hepatitis C Screening  Completed   Zoster Vaccines- Shingrix  Completed   HPV VACCINES  Aged Out   Immunization History  Administered Date(s) Administered   Fluad Quad(high Dose 65+) 08/14/2019, 09/04/2021   Hepatitis B, ADULT 08/06/2009   Hepatitis B, PED/ADOLESCENT 08/06/2009   Influenza Split 08/07/2011, 08/28/2013   Influenza, High Dose Seasonal PF 09/26/2017   Influenza, Quadrivalent, Recombinant, Inj, Pf 09/06/2016   Influenza, Seasonal, Injecte, Preservative Fre 09/04/2014, 09/09/2015   Influenza,inj,Quad PF,6+ Mos 09/04/2014, 09/09/2015   Influenza,trivalent, recombinat, inj, PF 08/07/2011, 08/09/2012, 08/28/2013   Influenza-Unspecified 08/09/2012, 09/22/2018, 09/19/2020, 09/02/2022   Moderna Covid-19 Vaccine Bivalent Booster 1841yr up 12/10/2021   Moderna Sars-Covid-2 Vaccination 03/06/2020, 04/03/2020, 09/30/2020, 05/14/2021   Pneumococcal Conjugate-13 11/04/2014   Pneumococcal Polysaccharide-23 02/05/2011   Tdap 02/05/2011   Zoster Recombinat (Shingrix) 07/15/2018, 09/11/2018   Zoster, Live 02/05/2011   We updated and reviewed the patient's past history in detail and it is documented below. Allergies: Patient has No Known Allergies. Past Medical History  has a past medical history of Adenomatous polyps, Allergy, Arthritis, Cardiomegaly, ED (erectile dysfunction), Gallbladder polyp, Glaucoma (03/2020), Hepatitis A, Hyperlipidemia, Hypothyroidism, Non-traumatic rhabdomyolysis (02/21/2019), Thrombocytopenia ([place] Varicocele (02/01/2011). Past Surgical History Patient  has a past surgical history that includes Tonsillectomy; Eye surgery (Right, 1956); Eye surgery (Left, 1963); Liver biopsy; Colonoscopy (2014); Polypectomy; and Cholecystectomy (N/A, 01/30/2021). Social History Patient  reports that he has quit smoking. His smoking use included cigarettes. He  has never used smokeless tobacco. He  reports that he does not currently use alcohol after a past usage of about 6.0 standard drinks of alcohol per week. He reports that he does not use drugs. Family History family history includes Alzheimer's disease in his mother; Arthritis in his mother; Cancer in his brother and sister; Colon polyps in his brother; Dementia in his mother; Heart disease in his father and paternal grandmother; Hyperlipidemia in his brother; Stroke in his brother and mother. Review of Systems: Constitutional: negative for fever or malaise Ophthalmic: negative for photophobia, double vision or loss of vision Cardiovascular: negative for chest pain, dyspnea on exertion, or new LE swelling Respiratory: negative for SOB or persistent cough Gastrointestinal: negative for abdominal pain, change in bowel habits or melena Genitourinary: negative for dysuria or gross hematuria Musculoskeletal: negative for new gait disturbance or muscular weakness Integumentary: negative for new or persistent rashes Neurological: negative for TIA or stroke symptoms Psychiatric: negative for SI or delusions Allergic/Immunologic: negative for hives  Patient Care Team    Relationship Specialty Notifications Start End  [name], MD PCP - General Family Medicine  11/07/17   Stephannie Li, [place]  Ophthalmology  03/22/18   Daryll Brod, MD Consulting Physician Orthopedic Surgery  03/22/18   [place]  Dentistry  03/22/18   Thornton Park, MD Consulting Physician Gastroenterology  03/27/19   Tish Men, MD (Inactive) Consulting Physician Hematology  08/22/19   Madelin Rear, [facility] (Inactive) Pharmacist Pharmacist  03/19/21    Comment: Phone [phone]   Objective  Vitals: BP 128/70   Pulse 74   Temp 97.9 F (36.6 C)   Ht '6\' 1"'$  (1.854 m)   Wt 241 lb 12.8 oz (109.7 kg)   SpO2 96%   BMI 31.90 kg/m  General:  Well developed, well nourished, no acute distress  Psych:  Alert and orientedx3,normal mood and affect HEENT:  Normocephalic,  atraumatic, non-icteric sclera, PERRL, oropharynx is clear without mass or exudate, supple neck without adenopathy, mass or thyromegaly Cardiovascular:  Normal S1, S2, RRR without gallop, rub or murmur, nondisplaced PMI, +2 distal pulses in bilateral upper and lower extremities. Respiratory:  Good breath sounds bilaterally, CTAB with normal respiratory effort Gastrointestinal: normal bowel sounds, soft, non-tender, no noted masses. No HSM MSK: no deformities, contusions. Joints are without erythema or swelling. Spine and CVA region are nontender Skin:  Warm, no rashes or suspicious lesions noted Neurologic:    Mental status is normal. No tremor    Assessment  1. Annual physical exam   2. Acquired hypothyroidism   3. Mixed hyperlipidemia   4. SIADH (syndrome of inappropriate ADH production) ([place])   5. Adenomatous polyp of colon, unspecified part of colon   6. Leukopenia, unspecified type   7. Male erectile disorder   8. Thrombocytopenia [facility])      Plan  Male Wellness Visit: Age appropriate Health Maintenance and Prevention measures were discussed with patient. Included topics are cancer screening recommendations, ways to keep healthy (see AVS) including dietary and exercise recommendations, regular eye and dental care, use of seat belts, and avoidance of moderate alcohol use and tobacco use. Pt to schedule colonoscopy this year BMI: discussed patient's BMI and encouraged positive lifestyle modifications to help get to or maintain a target BMI. HM needs and immunizations were addressed and ordered. See below for orders. See HM and immunization section for updates. Routine labs and screening tests ordered including cmp, cbc and lipids where appropriate. Discussed recommendations regarding Vit D and calcium supplementation (  see AVS)  Chronic disease f/u and/or acute problem visit: (deemed necessary to be done in addition to the wellness visit): Low thyroid: check tsh and continue levothx 150  alternating with 110mg and feels well.  HLD: check nonfasting lipids on crestor 10 qhs Continue monitoring blood counts with hematology.  ED: controlled on sildenafil   Follow up: 12 mo for cpe  Commons side effects, risks, benefits, and alternatives for medications and treatment plan prescribed today were discussed, and the patient expressed understanding of the given instructions. Patient is instructed to call or message via MyChart if he/she has any questions or concerns regarding our treatment plan. No barriers to understanding were identified. We discussed Red Flag symptoms and signs in detail. Patient expressed understanding regarding what to do in case of urgent or emergency type symptoms.  Medication list was reconciled, printed and provided to the patient in AVS. Patient instructions and summary information was reviewed with the patient as documented in the AVS. This note was prepared with assistance of Dragon voice recognition software. Occasional wrong-word or sound-a-like substitutions may have occurred due to the inherent limitations of voice recognition software    Orders Placed This Encounter  Procedures   CBC with Differential/Platelet   Comprehensive metabolic panel   TSH   NMR, lipoprofile   No orders of the defined types were placed in this encounter.

## 2023-02-08 LAB — COMPREHENSIVE METABOLIC PANEL
ALT: 64 U/L — ABNORMAL HIGH (ref 0–53)
AST: 43 U/L — ABNORMAL HIGH (ref 0–37)
Albumin: 4.1 g/dL (ref 3.5–5.2)
Alkaline Phosphatase: 85 U/L (ref 39–117)
BUN: 17 mg/dL (ref 6–23)
CO2: 24 mEq/L (ref 19–32)
Calcium: 9.7 mg/dL (ref 8.4–10.5)
Chloride: 102 mEq/L (ref 96–112)
Creatinine, Ser: 1.05 mg/dL (ref 0.40–1.50)
GFR: 68.46 mL/min (ref >60.00)
Glucose, Bld: 84 mg/dL (ref 70–99)
Potassium: 4.3 mEq/L (ref 3.5–5.1)
Sodium: 136 mEq/L (ref 135–145)
Total Bilirubin: 0.8 mg/dL (ref 0.2–1.2)
Total Protein: 6.9 g/dL (ref 6.0–8.3)

## 2023-02-08 LAB — CBC WITH DIFFERENTIAL/PLATELET
Basophils Absolute: 0 10*3/uL (ref 0.0–0.1)
Basophils Relative: 1.2 % (ref 0.0–3.0)
Eosinophils Absolute: 0.1 10*3/uL (ref 0.0–0.7)
Eosinophils Relative: 2.7 % (ref 0.0–5.0)
HCT: 38.6 % — ABNORMAL LOW (ref 39.0–52.0)
Hemoglobin: 13.5 g/dL (ref 13.0–17.0)
Lymphocytes Relative: 28.3 % (ref 12.0–46.0)
Lymphs Abs: 1.1 10*3/uL (ref 0.7–4.0)
MCHC: 35.1 g/dL (ref 30.0–36.0)
MCV: 99.7 fl (ref 78.0–100.0)
Monocytes Absolute: 0.4 10*3/uL (ref 0.1–1.0)
Monocytes Relative: 10.3 % (ref 3.0–12.0)
Neutro Abs: 2.2 10*3/uL (ref 1.4–7.7)
Neutrophils Relative %: 57.5 % (ref 43.0–77.0)
Platelets: 130 10*3/uL — ABNORMAL LOW (ref 150.0–400.0)
RBC: 3.87 Mil/uL — ABNORMAL LOW (ref 4.22–5.81)
RDW: 13 % (ref 11.5–15.5)
WBC: 3.8 10*3/uL — ABNORMAL LOW (ref 4.0–10.5)

## 2023-02-08 LAB — TSH: TSH: 1.45 u[IU]/mL (ref 0.35–5.50)

## 2023-02-10 ENCOUNTER — Other Ambulatory Visit: Payer: Self-pay | Admitting: Family Medicine

## 2023-02-10 DIAGNOSIS — E039 Hypothyroidism, unspecified: Secondary | ICD-10-CM

## 2023-02-11 LAB — NMR, LIPOPROFILE

## 2023-02-11 LAB — SPECIMEN STATUS REPORT

## 2023-02-11 MED ORDER — LEVOTHYROXINE SODIUM 137 MCG PO TABS: ORAL_TABLET | ORAL | 3 refills | Status: DC

## 2023-02-11 NOTE — Addendum Note (Signed)
Addended by: Billey Chang on: 02/11/2023 01:16 PM   Modules accepted: Orders

## 2023-02-12 LAB — NMR, LIPOPROFILE

## 2023-02-12 LAB — SPECIMEN STATUS REPORT

## 2023-02-16 DIAGNOSIS — M1711 Unilateral primary osteoarthritis, right knee: Secondary | ICD-10-CM | POA: Diagnosis not present

## 2023-03-02 ENCOUNTER — Other Ambulatory Visit: Payer: Medicare Other

## 2023-03-02 DIAGNOSIS — E782 Mixed hyperlipidemia: Secondary | ICD-10-CM | POA: Diagnosis not present

## 2023-03-03 LAB — NMR, LIPOPROFILE
Cholesterol, Total: 174 mg/dL (ref 100–199)
HDL Particle Number: 33.6 umol/L (ref >=30.5)
HDL-C: 55 mg/dL (ref >39)
LDL Particle Number: 1136 nmol/L — ABNORMAL HIGH (ref <1000)
LDL Size: 21 nm (ref >20.5)
LDL-C (NIH Calc): 102 mg/dL — ABNORMAL HIGH (ref 0–99)
LP-IR Score: 47 — ABNORMAL HIGH (ref <=45)
Small LDL Particle Number: 485 nmol/L (ref <=527)
Triglycerides: 94 mg/dL (ref 0–149)

## 2023-03-14 ENCOUNTER — Other Ambulatory Visit: Payer: Self-pay | Admitting: Family Medicine

## 2023-03-14 DIAGNOSIS — E039 Hypothyroidism, unspecified: Secondary | ICD-10-CM

## 2023-04-08 ENCOUNTER — Encounter: Payer: Self-pay | Admitting: Internal Medicine

## 2023-04-15 DIAGNOSIS — M1711 Unilateral primary osteoarthritis, right knee: Secondary | ICD-10-CM | POA: Diagnosis not present

## 2023-04-22 ENCOUNTER — Encounter: Payer: Self-pay | Admitting: Internal Medicine

## 2023-04-22 ENCOUNTER — Other Ambulatory Visit: Payer: Self-pay

## 2023-04-22 ENCOUNTER — Ambulatory Visit (AMBULATORY_SURGERY_CENTER): Payer: Medicare Other

## 2023-04-22 VITALS — Ht 73.0 in | Wt 235.0 lb

## 2023-04-22 DIAGNOSIS — Z86010 Personal history of colonic polyps: Secondary | ICD-10-CM

## 2023-04-22 MED ORDER — NA SULFATE-K SULFATE-MG SULF 17.5-3.13-1.6 GM/177ML PO SOLN: 1.0000 | Freq: Once | ORAL | 0 refills | Status: AC

## 2023-04-22 NOTE — Progress Notes (Signed)
Denies allergies to eggs or soy products. Denies complication of anesthesia or sedation. Denies use of weight loss medication. Denies use of O2.   Emmi instructions given for colonoscopy.  

## 2023-05-16 ENCOUNTER — Encounter: Payer: Self-pay | Admitting: Internal Medicine

## 2023-05-16 ENCOUNTER — Ambulatory Visit (AMBULATORY_SURGERY_CENTER): Payer: Medicare Other | Admitting: Internal Medicine

## 2023-05-16 ENCOUNTER — Ambulatory Visit (INDEPENDENT_AMBULATORY_CARE_PROVIDER_SITE_OTHER): Payer: Medicare Other

## 2023-05-16 VITALS — Wt 235.0 lb

## 2023-05-16 VITALS — BP 110/76 | Resp 15

## 2023-05-16 DIAGNOSIS — Z0000 Encounter for general adult medical examination without abnormal findings: Secondary | ICD-10-CM

## 2023-05-16 DIAGNOSIS — Z86010 Personal history of colonic polyps: Secondary | ICD-10-CM

## 2023-05-16 DIAGNOSIS — Z09 Encounter for follow-up examination after completed treatment for conditions other than malignant neoplasm: Secondary | ICD-10-CM | POA: Diagnosis not present

## 2023-05-16 MED ORDER — SODIUM CHLORIDE 0.9 % IV SOLN
500.0000 mL | INTRAVENOUS | Status: DC
Start: 2023-05-16 — End: 2023-05-16

## 2023-05-16 NOTE — Patient Instructions (Signed)
Read all of the handouts given to you by your recovery room nurse.  Resume all of your previous medications today.  YOU HAD AN ENDOSCOPIC PROCEDURE TODAY AT THE [facility]:   Refer to the procedure report that was given to you for any specific questions about what was found during the examination.  If the procedure report does not answer your questions, please call your gastroenterologist to clarify.  If you requested that your care partner not be given the details of your procedure findings, then the procedure report has been included in a sealed envelope for you to review at your convenience later.  YOU SHOULD EXPECT: Some feelings of bloating in the abdomen. Passage of more gas than usual.  Walking can help get rid of the air that was put into your GI tract during the procedure and reduce the bloating. If you had a lower endoscopy (such as a colonoscopy or flexible sigmoidoscopy) you may notice spotting of blood in your stool or on the toilet paper. If you underwent a bowel prep for your procedure, you may not have a normal bowel movement for a few days.  Please Note:  You might notice some irritation and congestion in your nose or some drainage.  This is from the oxygen used during your procedure.  There is no need for concern and it should clear up in a day or so.  SYMPTOMS TO REPORT IMMEDIATELY:  Following lower endoscopy (colonoscopy or flexible sigmoidoscopy):  Excessive amounts of blood in the stool  Significant tenderness or worsening of abdominal pains  Swelling of the abdomen that is new, acute  Fever of 100F or higher   For urgent or emergent issues, a gastroenterologist can be reached at any hour by calling [phone]. Do not use MyChart messaging for urgent concerns.    DIET:  We do recommend a small meal at first, but then you may proceed to your regular diet.  Drink plenty of fluids but you should avoid alcoholic beverages for 24 hours. Try to increase the  fiber in your diet, and drink plenty of water.  ACTIVITY:  You should plan to take it easy for the rest of today and you should NOT DRIVE or use heavy machinery until tomorrow (because of the sedation medicines used during the test).    FOLLOW UP: Our staff will call the number listed on your records the next business day following your procedure.  We will call around 7:15- 8:00 am to check on you and address any questions or concerns that you may have regarding the information given to you following your procedure. If we do not reach you, we will leave a message.     If any biopsies were taken you will be contacted by phone or by letter within the next 1-3 weeks.  Please call us at [phone] if you have not heard about the biopsies in 3 weeks.    SIGNATURES/CONFIDENTIALITY: You and/or your care partner have signed paperwork which will be entered into your electronic medical record.  These signatures attest to the fact that that the information above on your After Visit Summary has been reviewed and is understood.  Full responsibility of the confidentiality of this discharge information lies with you and/or your care-partner.

## 2023-05-16 NOTE — Progress Notes (Signed)
Patient states there have been no changes to medical or surgical history since time of pre-visit. 

## 2023-05-16 NOTE — Progress Notes (Signed)
I connected with  Timothy Grant on 05/16/23 by a audio enabled telemedicine application and verified that I am speaking with the correct person using two identifiers.  Patient Location: Home  Provider Location: Office/Clinic  I discussed the limitations of evaluation and management by telemedicine. The patient expressed understanding and agreed to proceed.  Patient Medicare AWV questionnaire was completed by the patient on 05/15/23; I have confirmed that all information answered by patient is correct and no changes since this date.      Subjective:   Timothy Grant is a 77 y.o. male who presents for Medicare Annual/Subsequent preventive examination.  Review of Systems     Cardiac Risk Factors include: advanced age (>58men, >71 women);obesity (BMI >30kg/m2);male gender     Objective:    Today's Vitals   05/16/23 1326  Weight: 235 lb (106.6 kg)   Body mass index is 31 kg/m.     05/16/2023    1:32 PM 01/13/2023   10:15 AM 03/23/2022   11:51 AM 01/13/2022    9:51 AM 01/23/2021   11:11 AM 01/13/2021    9:42 AM 09/25/2020   10:51 AM  Advanced Directives  Does Patient Have a Medical Advance Directive? Yes Yes Yes Yes Yes Yes Yes  Type of Estate agent of [place];Living will Healthcare Power of [place];Living will Living will Living will;Healthcare Power of [employer] Power of [place];Living will Healthcare Power of [place];Living will Healthcare Power of [place];Living will  Does patient want to make changes to medical advance directive?    No - Patient declined  No - Patient declined   Copy of Healthcare Power of Attorney in Chart? No - copy requested No - copy requested  No - copy requested No - copy requested No - copy requested No - copy requested    Current Medications (verified) Outpatient Encounter Medications as of 05/16/2023  Medication Sig   acetaminophen (TYLENOL) 500 MG tablet Take 500 mg by mouth every 6 (six) hours as needed.    Cholecalciferol (VITAMIN D3) 125 MCG (5000 UT) CAPS Take 5,000 Units by mouth daily.   cyclobenzaprine (FLEXERIL) 10 MG tablet TAKE ONE TABLET BY MOUTH THREE TIMES A DAY AS NEEDED FOR MUSCLE SPASMS   diclofenac (VOLTAREN) 75 MG EC tablet Take 75 mg by mouth 2 (two) times daily.   folic acid (FOLVITE) 1 MG tablet TAKE 1 TABLET BY MOUTH DAILY   Glucosamine-Chondroit-Vit C-Mn (GLUCOSAMINE 1500 COMPLEX PO) Take 2,000 mg by mouth daily.   ibuprofen (ADVIL) 200 MG tablet Take 200 mg by mouth every 6 (six) hours as needed for mild pain.   levothyroxine (SYNTHROID) 137 MCG tablet Take 1 tablet by mouth daily on Monday, Wednesday, Friday.   levothyroxine (SYNTHROID) 150 MCG tablet TAKE 1 TABLET BY MOUTH ON TUESDAY, THURSDAY, SATURDAY, AND SUNDAY   loratadine (CLARITIN) 10 MG tablet Take 10 mg by mouth daily as needed for allergies.   Multiple Vitamin (MULTIVITAMIN) capsule Take 1 capsule by mouth daily.    Omega-3 Fatty Acids (FISH OIL) 1200 MG CAPS Take 1,200 mg by mouth daily.   rosuvastatin (CRESTOR) 10 MG tablet TAKE 1 TABLET BY MOUTH DAILY   sildenafil (REVATIO) 20 MG tablet Use 5 tablets PO as needed   MODERNA COVID-19 BIVAL BOOSTER 50 MCG/0.5ML injection    No facility-administered encounter medications on file as of 05/16/2023.    Allergies (verified) Patient has no known allergies.   History: Past Medical History:  Diagnosis Date   Adenomatous polyps    Allergy  seasonal   Arthritis    Cardiomegaly    Cataract    ED (erectile dysfunction)    Gallbladder polyp    Glaucoma 03/2020   no tx as of yet   Hepatitis A    Hyperlipidemia    Hypothyroidism    Non-traumatic rhabdomyolysis 02/21/2019   Thrombocytopenia (HCC)    Varicocele 02/01/2011   Left testicle   Past Surgical History:  Procedure Laterality Date   CHOLECYSTECTOMY N/A 01/30/2021   Procedure: LAPAROSCOPIC CHOLECYSTECTOMY;  Surgeon: Fritzi Mandes, MD;  Location: [facility];  Service: General;  Laterality: N/A;  ROOM 1  STARTING AT 01:00PM   COLONOSCOPY  2014   EYE SURGERY Right 1956   EYE SURGERY Left 1963   LIVER BIOPSY     POLYPECTOMY     TONSILLECTOMY     Family History  Problem Relation Age of Onset   Alzheimer's disease Mother    Arthritis Mother    Dementia Mother    Stroke Mother    Heart disease Father    Cancer Sister        uterine   Hyperlipidemia Brother    Stroke Brother    Cancer Brother    Colon polyps Brother    Heart disease Paternal Grandmother    Colon cancer Neg Hx    Esophageal cancer Neg Hx    Rectal cancer Neg Hx    Stomach cancer Neg Hx    Social History   Socioeconomic History   Marital status: Married    Spouse name: Not on file   Number of children: Not on file   Years of education: Not on file   Highest education level: Not on file  Occupational History   Occupation: retired  Tobacco Use   Smoking status: Former    Years: 10    Types: Cigarettes, Cigars   Smokeless tobacco: Never   Tobacco comments:    quit 1975  Vaping Use   Vaping Use: Never used  Substance and Sexual Activity   Alcohol use: Yes    Alcohol/week: 6.0 standard drinks of alcohol    Types: 6 Cans of beer per week    Comment: Socially   Drug use: No   Sexual activity: Yes  Other Topics Concern   Not on file  Social History Narrative   Not on file   Social Determinants of Health   Financial Resource Strain: Low Risk  (05/15/2023)   Overall Financial Resource Strain (CARDIA)    Difficulty of Paying Living Expenses: Not hard at all  Food Insecurity: No Food Insecurity (05/15/2023)   Hunger Vital Sign    Worried About Running Out of Food in the Last Year: Never true    Ran Out of Food in the Last Year: Never true  Transportation Needs: No Transportation Needs (05/15/2023)   PRAPARE - Administrator, Civil Service (Medical): No    Lack of Transportation (Non-Medical): No  Physical Activity: Sufficiently Active (05/15/2023)   Exercise Vital Sign    Days of Exercise per  Week: 5 days    Minutes of Exercise per Session: 30 min  Stress: No Stress Concern Present (05/15/2023)   [employer] of Occupational Health - Occupational Stress Questionnaire    Feeling of Stress : Not at all  Social Connections: Moderately Isolated (05/15/2023)   Social Connection and Isolation Panel [NHANES]    Frequency of Communication with Friends and Family: More than three times a week    Frequency of Social  Gatherings with Friends and Family: Twice a week    Attends Religious Services: Never    Database administrator or Organizations: No    Attends Engineer, structural: Never    Marital Status: Married    Tobacco Counseling Counseling given: Not Answered Tobacco comments: quit 1975   Clinical Intake:  Pre-visit preparation completed: Yes  Pain : No/denies pain     BMI - recorded: 31 Nutritional Status: BMI > 30  Obese Nutritional Risks: None Diabetes: No  How often do you need to have someone help you when you read instructions, pamphlets, or other written materials from your doctor or pharmacy?: 1 - Never  Diabetic?no  Interpreter Needed?: No  Information entered by :: Lanier Ensign, LPN   Activities of Daily Living    05/15/2023    2:36 PM  In your present state of health, do you have any difficulty performing the following activities:  Hearing? 0  Vision? 0  Difficulty concentrating or making decisions? 0  Walking or climbing stairs? 0  Dressing or bathing? 0  Doing errands, shopping? 0  Preparing Food and eating ? N  Using the Toilet? N  In the past six months, have you accidently leaked urine? N  Do you have problems with loss of bowel control? N  Managing your Medications? N  Managing your Finances? N  Housekeeping or managing your Housekeeping? N    Patient Care Team: Willow Ora, MD as PCP - General (Family Medicine) Rodrigo Ran, [place] (Ophthalmology) Cindee Salt, MD as Consulting Physician (Orthopedic  Surgery) [place] (Dentistry) Tressia Danas, MD as Consulting Physician (Gastroenterology) Arthur Holms, MD (Inactive) as Consulting Physician (Hematology) Dahlia Byes, [facility] (Inactive) as Pharmacist (Pharmacist)  Indicate any recent Medical Services you may have received from other than [facility] providers in the past year (date may be approximate).     Assessment:   This is a routine wellness examination for Timothy Grant.  Hearing/Vision screen Hearing Screening - Comments:: Pt denies any hearing issues  Vision Screening - Comments:: Pt follows up with dr Parke Simmers for annual eye exams   Dietary issues and exercise activities discussed: Current Exercise Habits: Home exercise routine, Type of exercise: walking, Time (Minutes): 30, Frequency (Times/Week): 5, Weekly Exercise (Minutes/Week): 150   Goals Addressed             This Visit's Progress    Patient Stated       Lose weight        Depression Screen    05/16/2023    1:31 PM 02/07/2023    2:25 PM 03/23/2022   11:51 AM 05/18/2021    9:01 AM 09/25/2020   10:49 AM 08/22/2019   10:35 AM 03/22/2018   10:17 AM  PHQ 2/9 Scores  PHQ - 2 Score 0 0 0 0 0 0 0    Fall Risk    05/15/2023    2:36 PM 02/07/2023    2:25 PM 03/23/2022   11:53 AM 05/18/2021    9:01 AM 09/25/2020   10:53 AM  Fall Risk   Falls in the past year? 0 0 0 0 0  Number falls in past yr: 0 0 0 0 0  Injury with Fall? 0 0 0 0 0  Risk for fall due to : Impaired vision No Fall Risks Impaired vision  Impaired vision  Follow up Falls prevention discussed Falls evaluation completed Falls prevention discussed  Falls prevention discussed    FALL RISK PREVENTION PERTAINING TO THE HOME:  Any stairs in or around the home? Yes  If so, are there any without handrails? No  Home free of loose throw rugs in walkways, pet beds, electrical cords, etc? Yes  Adequate lighting in your home to reduce risk of falls? Yes   ASSISTIVE DEVICES UTILIZED TO PREVENT FALLS:  Life alert? No   Use of a cane, walker or w/c? No  Grab bars in the bathroom? No  Shower chair or bench in shower? Yes  Elevated toilet seat or a handicapped toilet? No   TIMED UP AND GO:  Was the test performed? No .  Cognitive Function:    03/22/2018   10:18 AM  MMSE - Mini Mental State Exam  Orientation to time 5  Orientation to Place 5  Registration 3  Attention/ Calculation 5  Recall 3  Language- name 2 objects 2  Language- repeat 1  Language- follow 3 step command 3  Language- read & follow direction 1  Write a sentence 1  Copy design 1  Total score 30        05/16/2023    1:33 PM 03/23/2022   11:55 AM 09/25/2020   10:56 AM 08/22/2019   10:36 AM  6CIT Screen  What Year? 0 points 0 points 0 points 0 points  What month? 0 points 0 points 0 points 0 points  What time? 0 points 0 points  0 points  Count back from 20 0 points 0 points 0 points 0 points  Months in reverse 0 points 0 points 0 points 0 points  Repeat phrase 0 points 0 points 0 points 0 points  Total Score 0 points 0 points  0 points    Immunizations Immunization History  Administered Date(s) Administered   Fluad Quad(high Dose 65+) 08/14/2019, 09/04/2021   Hepatitis B, ADULT 08/06/2009   Hepatitis B, PED/ADOLESCENT 08/06/2009   Influenza Split 08/07/2011, 08/28/2013   Influenza, High Dose Seasonal PF 09/26/2017   Influenza, Quadrivalent, Recombinant, Inj, Pf 09/06/2016   Influenza, Seasonal, Injecte, Preservative Fre 09/04/2014, 09/09/2015   Influenza,inj,Quad PF,6+ Mos 09/04/2014, 09/09/2015   Influenza,trivalent, recombinat, inj, PF 08/07/2011, 08/09/2012, 08/28/2013   Influenza-Unspecified 08/09/2012, 09/22/2018, 09/19/2020, 09/02/2022   Moderna Covid-19 Vaccine Bivalent Booster 38yrs & up 12/10/2021   Moderna Sars-Covid-2 Vaccination 03/06/2020, 04/03/2020, 09/30/2020, 05/14/2021   Pneumococcal Conjugate-13 11/04/2014   Pneumococcal Polysaccharide-23 02/05/2011   Tdap 02/05/2011   Zoster Recombinat  (Shingrix) 07/15/2018, 09/11/2018   Zoster, Live 02/05/2011    TDAP status: Due, Education has been provided regarding the importance of this vaccine. Advised may receive this vaccine at local pharmacy or Health Dept. Aware to provide a copy of the vaccination record if obtained from local pharmacy or Health Dept. Verbalized acceptance and understanding.  Flu Vaccine status: Up to date  Pneumococcal vaccine status: Up to date  Covid-19 vaccine status: Completed vaccines  Qualifies for Shingles Vaccine? Yes   Zostavax completed Yes   Shingrix Completed?: Yes  Screening Tests Health Maintenance  Topic Date Due   DTaP/Tdap/Td (2 - Td or Tdap) 02/04/2021   COVID-19 Vaccine (6 - 2023-24 season) 08/06/2022   INFLUENZA VACCINE  07/07/2023   Medicare Annual Wellness (AWV)  05/15/2024   Colonoscopy  05/15/2026   Pneumonia Vaccine 78+ Years old  Completed   Hepatitis C Screening  Completed   Zoster Vaccines- Shingrix  Completed   HPV VACCINES  Aged Out    Health Maintenance  Health Maintenance Due  Topic Date Due   DTaP/Tdap/Td (2 - Td or Tdap) 02/04/2021  COVID-19 Vaccine (6 - 2023-24 season) 08/06/2022    Colorectal cancer screening: Type of screening: Colonoscopy. Completed 05/16/23. Repeat every 3 years   Additional Screening:  Hepatitis C Screening:  Completed 12/25/18  Vision Screening: Recommended annual ophthalmology exams for early detection of glaucoma and other disorders of the eye. Is the patient up to date with their annual eye exam?  Yes  Who is the provider or what is the name of the office in which the patient attends annual eye exams? Dr Parke Simmers  If pt is not established with a provider, would they like to be referred to a provider to establish care? No .   Dental Screening: Recommended annual dental exams for proper oral hygiene  Community Resource Referral / Chronic Care Management: CRR required this visit?  No   CCM required this visit?  No       Plan:     I have personally reviewed and noted the following in the patient's chart:   Medical and social history Use of alcohol, tobacco or illicit drugs  Current medications and supplements including opioid prescriptions. Patient is not currently taking opioid prescriptions. Functional ability and status Nutritional status Physical activity Advanced directives List of other physicians Hospitalizations, surgeries, and ER visits in previous 12 months Vitals Screenings to include cognitive, depression, and falls Referrals and appointments  In addition, I have reviewed and discussed with patient certain preventive protocols, quality metrics, and best practice recommendations. A written personalized care plan for preventive services as well as general preventive health recommendations were provided to patient.     Marzella Schlein, LPN   [id number]   Nurse Notes: none

## 2023-05-16 NOTE — Progress Notes (Signed)
Sedate, gd SR, tolerated procedure well, VSS, report to RN 

## 2023-05-16 NOTE — Patient Instructions (Signed)
Timothy Grant , Thank you for taking time to come for your Medicare Wellness Visit. I appreciate your ongoing commitment to your health goals. Please review the following plan we discussed and let me know if I can assist you in the future.   These are the goals we discussed:  Goals      Patient Stated     Not at this time     Patient Stated     None at this time      Patient Stated     Lose weight      Weight (lb) < 240 lb (108.9 kg)     Lose 15-20 lbs by increasing activity and decrease caloric intake.         This is a list of the screening recommended for you and due dates:  Health Maintenance  Topic Date Due   DTaP/Tdap/Td vaccine (2 - Td or Tdap) 02/04/2021   COVID-19 Vaccine (6 - 2023-24 season) 08/06/2022   Flu Shot  07/07/2023   Medicare Annual Wellness Visit  05/15/2024   Colon Cancer Screening  05/15/2026   Pneumonia Vaccine  Completed   Hepatitis C Screening  Completed   Zoster (Shingles) Vaccine  Completed   HPV Vaccine  Aged Out    Advanced directives: Please bring a copy of your health care power of attorney and living will to the office at your convenience.  Conditions/risks identified: lose a little weight   Next appointment: Follow up in one year for your annual wellness visit.   Preventive Care 45 Years and Older, Male  Preventive care refers to lifestyle choices and visits with your health care provider that can promote health and wellness. What does preventive care include? A yearly physical exam. This is also called an annual well check. Dental exams once or twice a year. Routine eye exams. Ask your health care provider how often you should have your eyes checked. Personal lifestyle choices, including: Daily care of your teeth and gums. Regular physical activity. Eating a healthy diet. Avoiding tobacco and drug use. Limiting alcohol use. Practicing safe sex. Taking low doses of aspirin every day. Taking vitamin and mineral supplements as  recommended by your health care provider. What happens during an annual well check? The services and screenings done by your health care provider during your annual well check will depend on your age, overall health, lifestyle risk factors, and family history of disease. Counseling  Your health care provider may ask you questions about your: Alcohol use. Tobacco use. Drug use. Emotional well-being. Home and relationship well-being. Sexual activity. Eating habits. History of falls. Memory and ability to understand (cognition). Work and work Astronomer. Screening  You may have the following tests or measurements: Height, weight, and BMI. Blood pressure. Lipid and cholesterol levels. These may be checked every 5 years, or more frequently if you are over 65 years old. Skin check. Lung cancer screening. You may have this screening every year starting at age 67 if you have a 30-pack-year history of smoking and currently smoke or have quit within the past 15 years. Fecal occult blood test (FOBT) of the stool. You may have this test every year starting at age 22. Flexible sigmoidoscopy or colonoscopy. You may have a sigmoidoscopy every 5 years or a colonoscopy every 10 years starting at age 10. Prostate cancer screening. Recommendations will vary depending on your family history and other risks. Hepatitis C blood test. Hepatitis B blood test. Sexually transmitted disease (STD) testing. Diabetes screening.  This is done by checking your blood sugar (glucose) after you have not eaten for a while (fasting). You may have this done every 1-3 years. Abdominal aortic aneurysm (AAA) screening. You may need this if you are a current or former smoker. Osteoporosis. You may be screened starting at age 22 if you are at high risk. Talk with your health care provider about your test results, treatment options, and if necessary, the need for more tests. Vaccines  Your health care provider may recommend  certain vaccines, such as: Influenza vaccine. This is recommended every year. Tetanus, diphtheria, and acellular pertussis (Tdap, Td) vaccine. You may need a Td booster every 10 years. Zoster vaccine. You may need this after age 37. Pneumococcal 13-valent conjugate (PCV13) vaccine. One dose is recommended after age 6. Pneumococcal polysaccharide (PPSV23) vaccine. One dose is recommended after age 35. Talk to your health care provider about which screenings and vaccines you need and how often you need them. This information is not intended to replace advice given to you by your health care provider. Make sure you discuss any questions you have with your health care provider. Document Released: 12/19/2015 Document Revised: 08/11/2016 Document Reviewed: 09/23/2015 Elsevier Interactive Patient Education  2017 [employer].  Fall Prevention in the Home Falls can cause injuries. They can happen to people of all ages. There are many things you can do to make your home safe and to help prevent falls. What can I do on the outside of my home? Regularly fix the edges of walkways and driveways and fix any cracks. Remove anything that might make you trip as you walk through a door, such as a raised step or threshold. Trim any bushes or trees on the path to your home. Use bright outdoor lighting. Clear any walking paths of anything that might make someone trip, such as rocks or tools. Regularly check to see if handrails are loose or broken. Make sure that both sides of any steps have handrails. Any raised decks and porches should have guardrails on the edges. Have any leaves, snow, or ice cleared regularly. Use sand or salt on walking paths during winter. Clean up any spills in your garage right away. This includes oil or grease spills. What can I do in the bathroom? Use night lights. Install grab bars by the toilet and in the tub and shower. Do not use towel bars as grab bars. Use non-skid mats or  decals in the tub or shower. If you need to sit down in the shower, use a plastic, non-slip stool. Keep the floor dry. Clean up any water that spills on the floor as soon as it happens. Remove soap buildup in the tub or shower regularly. Attach bath mats securely with double-sided non-slip rug tape. Do not have throw rugs and other things on the floor that can make you trip. What can I do in the bedroom? Use night lights. Make sure that you have a light by your bed that is easy to reach. Do not use any sheets or blankets that are too big for your bed. They should not hang down onto the floor. Have a firm chair that has side arms. You can use this for support while you get dressed. Do not have throw rugs and other things on the floor that can make you trip. What can I do in the kitchen? Clean up any spills right away. Avoid walking on wet floors. Keep items that you use a lot in easy-to-reach places. If  you need to reach something above you, use a strong step stool that has a grab bar. Keep electrical cords out of the way. Do not use floor polish or wax that makes floors slippery. If you must use wax, use non-skid floor wax. Do not have throw rugs and other things on the floor that can make you trip. What can I do with my stairs? Do not leave any items on the stairs. Make sure that there are handrails on both sides of the stairs and use them. Fix handrails that are broken or loose. Make sure that handrails are as long as the stairways. Check any carpeting to make sure that it is firmly attached to the stairs. Fix any carpet that is loose or worn. Avoid having throw rugs at the top or bottom of the stairs. If you do have throw rugs, attach them to the floor with carpet tape. Make sure that you have a light switch at the top of the stairs and the bottom of the stairs. If you do not have them, ask someone to add them for you. What else can I do to help prevent falls? Wear shoes that: Do not  have high heels. Have rubber bottoms. Are comfortable and fit you well. Are closed at the toe. Do not wear sandals. If you use a stepladder: Make sure that it is fully opened. Do not climb a closed stepladder. Make sure that both sides of the stepladder are locked into place. Ask someone to hold it for you, if possible. Clearly mark and make sure that you can see: Any grab bars or handrails. First and last steps. Where the edge of each step is. Use tools that help you move around (mobility aids) if they are needed. These include: Canes. Walkers. Scooters. Crutches. Turn on the lights when you go into a dark area. Replace any light bulbs as soon as they burn out. Set up your furniture so you have a clear path. Avoid moving your furniture around. If any of your floors are uneven, fix them. If there are any pets around you, be aware of where they are. Review your medicines with your doctor. Some medicines can make you feel dizzy. This can increase your chance of falling. Ask your doctor what other things that you can do to help prevent falls. This information is not intended to replace advice given to you by your health care provider. Make sure you discuss any questions you have with your health care provider. Document Released: 09/18/2009 Document Revised: 04/29/2016 Document Reviewed: 12/27/2014 Elsevier Interactive Patient Education  2017 [employer].

## 2023-05-16 NOTE — Op Note (Signed)
[name] Procedure Date: 05/16/2023 10:05 AM MRN: [id number] Endoscopist: Wilhemina Bonito. Marina Goodell , MD, [id number] Age: 77 Referring MD:  Date of Birth: 1946-07-21 Gender: Male Account #: [id number] Procedure:                Colonoscopy Indications:              High risk colon cancer surveillance: Personal                            history of multiple (3 or more) adenomas. Previous                            examinations elsewhere then most recently here 2021                            (Dr. Orvan Falconer) Medicines:                Monitored Anesthesia Care Procedure:                Pre-Anesthesia Assessment:                           - Prior to the procedure, a History and Physical                            was performed, and patient medications and                            allergies were reviewed. The patient's tolerance of                            previous anesthesia was also reviewed. The risks                            and benefits of the procedure and the sedation                            options and risks were discussed with the patient.                            All questions were answered, and informed consent                            was obtained. Prior Anticoagulants: The patient has                            taken no anticoagulant or antiplatelet agents. ASA                            Grade Assessment: II - A patient with mild systemic                            disease. After reviewing the risks and benefits,  the patient was deemed in satisfactory condition to                            undergo the procedure.                           After obtaining informed consent, the colonoscope                            was passed under direct vision. Throughout the                            procedure, the patient's blood pressure, pulse, and                            oxygen saturations were monitored  continuously. The                            Olympus CF-[id number]) Colonoscope was                            introduced through the anus and advanced to the the                            cecum, identified by appendiceal orifice and                            ileocecal valve. The ileocecal valve, appendiceal                            orifice, and rectum were photographed. The quality                            of the bowel preparation was excellent. The                            colonoscopy was performed without difficulty. The                            patient tolerated the procedure well. The bowel                            preparation used was SUPREP via split dose                            instruction. Scope In: 10:17:27 AM Scope Out: 10:25:44 AM Scope Withdrawal Time: 0 hours 7 minutes 4 seconds  Total Procedure Duration: 0 hours 8 minutes 17 seconds  Findings:                 Multiple diverticula were found in the left colon                            and right colon.  The exam was otherwise without abnormality on                            direct and retroflexion views. Complications:            No immediate complications. Estimated blood loss:                            None. Estimated Blood Loss:     Estimated blood loss: none. Impression:               - Diverticulosis in the left colon and in the right                            colon.                           - The examination was otherwise normal on direct                            and retroflexion views.                           - No specimens collected. Recommendation:           - Repeat colonoscopy is not recommended for                            surveillance.                           - Patient has a contact number available for                            emergencies. The signs and symptoms of potential                            delayed complications were discussed with the                             patient. Return to normal activities tomorrow.                            Written discharge instructions were provided to the                            patient.                           - Resume previous diet.                           - Continue present medications. Wilhemina Bonito. Marina Goodell, MD 05/16/2023 10:29:02 AM This report has been signed electronically.

## 2023-05-16 NOTE — Progress Notes (Signed)
HISTORY OF PRESENT ILLNESS:  Timothy Grant is a 77 y.o. male with a history of multiple adenomatous colon polyps.  Presents today for surveillance colonoscopy.  No complaints  REVIEW OF SYSTEMS:  All non-GI ROS negative except for  Past Medical History:  Diagnosis Date   Adenomatous polyps    Allergy    seasonal   Arthritis    Cardiomegaly    Cataract    ED (erectile dysfunction)    Gallbladder polyp    Glaucoma 03/2020   no tx as of yet   Hepatitis A    Hyperlipidemia    Hypothyroidism    Non-traumatic rhabdomyolysis 02/21/2019   Thrombocytopenia (HCC)    Varicocele 02/01/2011   Left testicle    Past Surgical History:  Procedure Laterality Date   CHOLECYSTECTOMY N/A 01/30/2021   Procedure: LAPAROSCOPIC CHOLECYSTECTOMY;  Surgeon: Fritzi Mandes, MD;  Location: [facility];  Service: General;  Laterality: N/A;  ROOM 1 STARTING AT 01:00PM   COLONOSCOPY  2014   EYE SURGERY Right 1956   EYE SURGERY Left 1963   LIVER BIOPSY     POLYPECTOMY     TONSILLECTOMY      Social History Timothy Grant  reports that he has been smoking cigarettes and cigars. He has never used smokeless tobacco. He reports current alcohol use of about 6.0 standard drinks of alcohol per week. He reports that he does not use drugs.  family history includes Alzheimer's disease in his mother; Arthritis in his mother; Cancer in his brother and sister; Colon polyps in his brother; Dementia in his mother; Heart disease in his father and paternal grandmother; Hyperlipidemia in his brother; Stroke in his brother and mother.  No Known Allergies     PHYSICAL EXAMINATION: Vital signs: There were no vitals taken for this visit. General: Well-developed, well-nourished, no acute distress HEENT: Sclerae are anicteric, conjunctiva pink. Oral mucosa intact Lungs: Clear Heart: Regular Abdomen: soft, nontender, nondistended, no obvious ascites, no peritoneal signs, normal bowel sounds. No  organomegaly. Extremities: No edema Psychiatric: alert and oriented x3. Cooperative     ASSESSMENT:  Personal history multiple adenomatous polyps   PLAN:   Surveillance colonoscopy

## 2023-05-17 ENCOUNTER — Telehealth: Payer: Self-pay | Admitting: *Deleted

## 2023-05-17 NOTE — Telephone Encounter (Signed)
Left message on f/u call 

## 2023-05-24 DIAGNOSIS — L821 Other seborrheic keratosis: Secondary | ICD-10-CM | POA: Diagnosis not present

## 2023-05-24 DIAGNOSIS — L814 Other melanin hyperpigmentation: Secondary | ICD-10-CM | POA: Diagnosis not present

## 2023-05-24 DIAGNOSIS — D225 Melanocytic nevi of trunk: Secondary | ICD-10-CM | POA: Diagnosis not present

## 2023-05-24 DIAGNOSIS — L570 Actinic keratosis: Secondary | ICD-10-CM | POA: Diagnosis not present

## 2023-06-09 ENCOUNTER — Other Ambulatory Visit: Payer: Self-pay | Admitting: Family Medicine

## 2023-07-14 ENCOUNTER — Inpatient Hospital Stay: Payer: Medicare Other | Admitting: Medical Oncology

## 2023-07-14 ENCOUNTER — Encounter: Payer: Self-pay | Admitting: Medical Oncology

## 2023-07-14 ENCOUNTER — Inpatient Hospital Stay: Payer: Medicare Other | Attending: Hematology & Oncology

## 2023-07-14 ENCOUNTER — Other Ambulatory Visit: Payer: Self-pay

## 2023-07-14 VITALS — BP 118/61 | HR 52 | Temp 98.6°F | Resp 18 | Ht 73.0 in | Wt 240.0 lb

## 2023-07-14 DIAGNOSIS — D649 Anemia, unspecified: Secondary | ICD-10-CM | POA: Diagnosis not present

## 2023-07-14 DIAGNOSIS — D696 Thrombocytopenia, unspecified: Secondary | ICD-10-CM

## 2023-07-14 LAB — CBC WITH DIFFERENTIAL/PLATELET
Abs Immature Granulocytes: 0.02 10*3/uL (ref 0.00–0.07)
Basophils Absolute: 0 10*3/uL (ref 0.0–0.1)
Basophils Relative: 1 %
Eosinophils Absolute: 0.2 10*3/uL (ref 0.0–0.5)
Eosinophils Relative: 5 %
HCT: 35.5 % — ABNORMAL LOW (ref 39.0–52.0)
Hemoglobin: 12.2 g/dL — ABNORMAL LOW (ref 13.0–17.0)
Immature Granulocytes: 1 %
Lymphocytes Relative: 24 %
Lymphs Abs: 0.8 10*3/uL (ref 0.7–4.0)
MCH: 34.7 pg — ABNORMAL HIGH (ref 26.0–34.0)
MCHC: 34.4 g/dL (ref 30.0–36.0)
MCV: 100.9 fL — ABNORMAL HIGH (ref 80.0–100.0)
Monocytes Absolute: 0.5 10*3/uL (ref 0.1–1.0)
Monocytes Relative: 15 %
Neutro Abs: 1.8 10*3/uL (ref 1.7–7.7)
Neutrophils Relative %: 54 %
Platelets: 130 10*3/uL — ABNORMAL LOW (ref 150–400)
RBC: 3.52 MIL/uL — ABNORMAL LOW (ref 4.22–5.81)
RDW: 13.3 % (ref 11.5–15.5)
WBC: 3.3 10*3/uL — ABNORMAL LOW (ref 4.0–10.5)
nRBC: 0 % (ref 0.0–0.2)

## 2023-07-14 LAB — CMP (CANCER CENTER ONLY)
ALT: 32 U/L (ref 0–44)
AST: 28 U/L (ref 15–41)
Albumin: 4.3 g/dL (ref 3.5–5.0)
Alkaline Phosphatase: 84 U/L (ref 38–126)
Anion gap: 6 (ref 5–15)
BUN: 19 mg/dL (ref 8–23)
CO2: 27 mmol/L (ref 22–32)
Calcium: 9.4 mg/dL (ref 8.9–10.3)
Chloride: 100 mmol/L (ref 98–111)
Creatinine: 1.05 mg/dL (ref 0.61–1.24)
GFR, Estimated: >60 mL/min (ref >60)
Glucose, Bld: 100 mg/dL — ABNORMAL HIGH (ref 70–99)
Potassium: 4.7 mmol/L (ref 3.5–5.1)
Sodium: 133 mmol/L — ABNORMAL LOW (ref 135–145)
Total Bilirubin: 0.6 mg/dL (ref 0.3–1.2)
Total Protein: 7.3 g/dL (ref 6.5–8.1)

## 2023-07-14 NOTE — Progress Notes (Signed)
Hematology and Oncology Follow Up Visit  Timothy Grant [id number] 1946/08/06 77 y.o. 07/14/2023   Principle Diagnosis:   Chronic thrombocytopenia-likely cirrhosis/splenomegaly  Current Therapy:   Observation     Interim History:  Timothy Grant is back for follow-up.  He is seen yearly for observation however at his last visit 6 months ago his platelet counts were lower than his baseline so it was recommended that he return in 6 months for follow up:   Since his last visit he has been doing well. No bleeding, unintentional weight loss, night sweats, bruising episodes or frequent infections. Only change in health is that he is now on diclofenac for knee pain.   There is no change in bowel or bladder habits.  Overall, I would say his performance status is ECOG 0.    Wt Readings from Last 3 Encounters:  07/14/23 240 lb (108.9 kg)  05/16/23 235 lb (106.6 kg)  04/22/23 235 lb (106.6 kg)    Medications:  Current Outpatient Medications:    acetaminophen (TYLENOL) 500 MG tablet, Take 500 mg by mouth every 6 (six) hours as needed., Disp: , Rfl:    Cholecalciferol (VITAMIN D3) 125 MCG (5000 UT) CAPS, Take 5,000 Units by mouth daily., Disp: , Rfl:    cyclobenzaprine (FLEXERIL) 10 MG tablet, TAKE ONE TABLET BY MOUTH THREE TIMES A DAY AS NEEDED FOR MUSCLE SPASMS, Disp: 90 tablet, Rfl: 1   diclofenac (VOLTAREN) 75 MG EC tablet, Take 75 mg by mouth 2 (two) times daily., Disp: , Rfl:    folic acid (FOLVITE) 1 MG tablet, TAKE 1 TABLET BY MOUTH DAILY, Disp: 90 tablet, Rfl: 1   Glucosamine-Chondroit-Vit C-Mn (GLUCOSAMINE 1500 COMPLEX PO), Take 2,000 mg by mouth daily., Disp: , Rfl:    ibuprofen (ADVIL) 200 MG tablet, Take 200 mg by mouth every 6 (six) hours as needed for mild pain., Disp: , Rfl:    levothyroxine (SYNTHROID) 137 MCG tablet, Take 1 tablet by mouth daily on Monday, Wednesday, Friday., Disp: 90 tablet, Rfl: 3   levothyroxine (SYNTHROID) 150 MCG tablet, TAKE 1 TABLET BY MOUTH ON  TUESDAY, THURSDAY, SATURDAY, AND SUNDAY, Disp: 16 tablet, Rfl: 3   loratadine (CLARITIN) 10 MG tablet, Take 10 mg by mouth daily as needed for allergies., Disp: , Rfl:    Multiple Vitamin (MULTIVITAMIN) capsule, Take 1 capsule by mouth daily. , Disp: , Rfl:    Omega-3 Fatty Acids (FISH OIL) 1200 MG CAPS, Take 1,200 mg by mouth daily., Disp: , Rfl:    rosuvastatin (CRESTOR) 10 MG tablet, TAKE 1 TABLET BY MOUTH DAILY, Disp: 90 tablet, Rfl: 3   sildenafil (REVATIO) 20 MG tablet, Use 5 tablets PO as needed, Disp: 90 tablet, Rfl: 3  Allergies: No Known Allergies  Past Medical History, Surgical history, Social history, and Family History were reviewed and updated.  Review of Systems: Review of Systems  Constitutional: Negative.   HENT:  Negative.    Eyes: Negative.   Respiratory: Negative.    Cardiovascular: Negative.   Gastrointestinal: Negative.   Endocrine: Negative.   Genitourinary: Negative.    Musculoskeletal: Negative.   Skin: Negative.   Neurological: Negative.   Hematological: Negative.   Psychiatric/Behavioral: Negative.      Physical Exam:  height is 6\' 1"  (1.854 m) and weight is 240 lb (108.9 kg). His oral temperature is 98.6 F (37 C). His blood pressure is 118/61 and his pulse is 52 (abnormal). His respiration is 18 and oxygen saturation is 100%.   Wt Readings from  Last 3 Encounters:  07/14/23 240 lb (108.9 kg)  05/16/23 235 lb (106.6 kg)  04/22/23 235 lb (106.6 kg)    Physical Exam Vitals reviewed.  HENT:     Head: Normocephalic and atraumatic.  Eyes:     Pupils: Pupils are equal, round, and reactive to light.  Cardiovascular:     Rate and Rhythm: Normal rate and regular rhythm.     Heart sounds: Normal heart sounds.  Pulmonary:     Effort: Pulmonary effort is normal.     Breath sounds: Normal breath sounds.  Abdominal:     General: Bowel sounds are normal.     Palpations: Abdomen is soft.  Musculoskeletal:        General: No tenderness or deformity.  Normal range of motion.     Cervical back: Normal range of motion.  Lymphadenopathy:     Cervical: No cervical adenopathy.  Skin:    General: Skin is warm and dry.     Findings: No erythema or rash.  Neurological:     Mental Status: He is alert and oriented to person, place, and time.  Psychiatric:        Behavior: Behavior normal.        Thought Content: Thought content normal.        Judgment: Judgment normal.      Lab Results  Component Value Date   WBC 3.3 (L) 07/14/2023   HGB 12.2 (L) 07/14/2023   HCT 35.5 (L) 07/14/2023   MCV 100.9 (H) 07/14/2023   PLT 130 (L) 07/14/2023     Chemistry      Component Value Date/Time   NA 136 02/07/2023 1500   K 4.3 02/07/2023 1500   CL 102 02/07/2023 1500   CO2 24 02/07/2023 1500   BUN 17 02/07/2023 1500   CREATININE 1.05 02/07/2023 1500   CREATININE 1.10 01/13/2022 0917   CREATININE 1.02 11/17/2020 0848      Component Value Date/Time   CALCIUM 9.7 02/07/2023 1500   ALKPHOS 85 02/07/2023 1500   AST 43 (H) 02/07/2023 1500   AST 36 01/13/2022 0917   ALT 64 (H) 02/07/2023 1500   ALT 35 01/13/2022 0917   BILITOT 0.8 02/07/2023 1500   BILITOT 0.8 01/13/2022 0917      Impression and Plan: Timothy Grant is a very nice 77 year old white male.  He has mild thrombocytopenia.  CBC today shows new macrocytic anemia (Hgb 12.2) along with a slightly worsened WBC of 3.3 (from 3.6). ANC is preserved at 1.8. Platelets are stable at 130. CMP shows preserved kidney and liver function. UTD on Hepatitis C screening according to 03/2023 CPE but his PCP. He is currently taking a multivitamin and folate daily. We will have him add a B12 vitamin to his regimen and plan to recheck labs in 1 month. Will include nutritional labs, erythropoietin, inflammatory labs. He will try to reduce his diclofenac use in the meantime. If levels do not improve with our efforts I would suggest a bone marrow biopsy.   Disposition RTC 1 month labs RTC 1 month + 3-7  days for APP only -[place]    Rushie Chestnut, PA-C 8/8/202410:10 AM

## 2023-07-15 ENCOUNTER — Telehealth: Payer: Self-pay | Admitting: *Deleted

## 2023-07-15 NOTE — Telephone Encounter (Signed)
Per 07/14/23 los - called patient and lvm of upcoming appointment - requested call back to confirm

## 2023-07-16 ENCOUNTER — Other Ambulatory Visit: Payer: Self-pay | Admitting: Family Medicine

## 2023-07-16 DIAGNOSIS — E039 Hypothyroidism, unspecified: Secondary | ICD-10-CM

## 2023-08-15 ENCOUNTER — Inpatient Hospital Stay: Payer: Medicare Other | Attending: Hematology & Oncology

## 2023-08-15 DIAGNOSIS — D696 Thrombocytopenia, unspecified: Secondary | ICD-10-CM | POA: Diagnosis not present

## 2023-08-15 DIAGNOSIS — D649 Anemia, unspecified: Secondary | ICD-10-CM

## 2023-08-15 DIAGNOSIS — R700 Elevated erythrocyte sedimentation rate: Secondary | ICD-10-CM | POA: Diagnosis not present

## 2023-08-15 LAB — CMP (CANCER CENTER ONLY)
ALT: 27 U/L (ref 0–44)
AST: 28 U/L (ref 15–41)
Albumin: 4.5 g/dL (ref 3.5–5.0)
Alkaline Phosphatase: 76 U/L (ref 38–126)
Anion gap: 6 (ref 5–15)
BUN: 13 mg/dL (ref 8–23)
CO2: 27 mmol/L (ref 22–32)
Calcium: 10.3 mg/dL (ref 8.9–10.3)
Chloride: 101 mmol/L (ref 98–111)
Creatinine: 0.99 mg/dL (ref 0.61–1.24)
GFR, Estimated: >60 mL/min (ref >60)
Glucose, Bld: 124 mg/dL — ABNORMAL HIGH (ref 70–99)
Potassium: 4.4 mmol/L (ref 3.5–5.1)
Sodium: 134 mmol/L — ABNORMAL LOW (ref 135–145)
Total Bilirubin: 0.5 mg/dL (ref 0.3–1.2)
Total Protein: 7.4 g/dL (ref 6.5–8.1)

## 2023-08-15 LAB — CBC WITH DIFFERENTIAL (CANCER CENTER ONLY)
Abs Immature Granulocytes: 0.01 10*3/uL (ref 0.00–0.07)
Basophils Absolute: 0 10*3/uL (ref 0.0–0.1)
Basophils Relative: 1 %
Eosinophils Absolute: 0.1 10*3/uL (ref 0.0–0.5)
Eosinophils Relative: 3 %
HCT: 38.6 % — ABNORMAL LOW (ref 39.0–52.0)
Hemoglobin: 13.2 g/dL (ref 13.0–17.0)
Immature Granulocytes: 0 %
Lymphocytes Relative: 19 %
Lymphs Abs: 0.7 10*3/uL (ref 0.7–4.0)
MCH: 34.2 pg — ABNORMAL HIGH (ref 26.0–34.0)
MCHC: 34.2 g/dL (ref 30.0–36.0)
MCV: 100 fL (ref 80.0–100.0)
Monocytes Absolute: 0.5 10*3/uL (ref 0.1–1.0)
Monocytes Relative: 12 %
Neutro Abs: 2.5 10*3/uL (ref 1.7–7.7)
Neutrophils Relative %: 65 %
Platelet Count: 132 10*3/uL — ABNORMAL LOW (ref 150–400)
RBC: 3.86 MIL/uL — ABNORMAL LOW (ref 4.22–5.81)
RDW: 12.5 % (ref 11.5–15.5)
WBC Count: 3.8 10*3/uL — ABNORMAL LOW (ref 4.0–10.5)
nRBC: 0 % (ref 0.0–0.2)

## 2023-08-15 LAB — C-REACTIVE PROTEIN: CRP: 0.6 mg/dL (ref <1.0)

## 2023-08-15 LAB — IRON AND IRON BINDING CAPACITY (CC-WL,HP ONLY)
Iron: 72 ug/dL (ref 45–182)
Saturation Ratios: 19 % (ref 17.9–39.5)
TIBC: 388 ug/dL (ref 250–450)
UIBC: 316 ug/dL (ref 117–376)

## 2023-08-15 LAB — SEDIMENTATION RATE: Sed Rate: 38 mm/h — ABNORMAL HIGH (ref 0–16)

## 2023-08-15 LAB — FERRITIN: Ferritin: 72 ng/mL (ref 24–336)

## 2023-08-15 LAB — VITAMIN B12: Vitamin B-12: 2780 pg/mL — ABNORMAL HIGH (ref 180–914)

## 2023-08-15 LAB — FOLATE: Folate: >40 ng/mL (ref >5.9)

## 2023-08-16 LAB — ERYTHROPOIETIN: Erythropoietin: 18.2 m[IU]/mL (ref 2.6–18.5)

## 2023-08-18 ENCOUNTER — Inpatient Hospital Stay: Payer: Medicare Other | Admitting: Medical Oncology

## 2023-08-18 ENCOUNTER — Encounter: Payer: Self-pay | Admitting: Medical Oncology

## 2023-08-18 ENCOUNTER — Other Ambulatory Visit: Payer: Self-pay

## 2023-08-18 VITALS — BP 141/72 | HR 56 | Temp 98.0°F | Resp 18 | Ht 73.0 in | Wt 238.0 lb

## 2023-08-18 DIAGNOSIS — R700 Elevated erythrocyte sedimentation rate: Secondary | ICD-10-CM | POA: Diagnosis not present

## 2023-08-18 DIAGNOSIS — E538 Deficiency of other specified B group vitamins: Secondary | ICD-10-CM

## 2023-08-18 DIAGNOSIS — D696 Thrombocytopenia, unspecified: Secondary | ICD-10-CM | POA: Diagnosis not present

## 2023-08-18 DIAGNOSIS — Z8619 Personal history of other infectious and parasitic diseases: Secondary | ICD-10-CM | POA: Diagnosis not present

## 2023-08-18 DIAGNOSIS — D72819 Decreased white blood cell count, unspecified: Secondary | ICD-10-CM

## 2023-08-18 NOTE — Progress Notes (Signed)
Hematology and Oncology Follow Up Visit  Timothy Grant [id number] [birth year] 77 y.o. 08/18/2023   Principle Diagnosis:   Chronic thrombocytopenia-likely cirrhosis/splenomegaly  Current Therapy:   Observation     Interim History:  Timothy Grant is back for follow-up.  He is seen yearly for observation however at his 07/14/2023 he had worsened pancytopenia and it suggested that he return to closer monitoring.   He has been following a healthy diet. He has been taking vitamin B12 supplement (5,000 international I.U once daily - advised to only take 1,000 I.U)He feels normal. No bleeding, unintentional weight loss, night sweats, bruising episodes or frequent infections. He avoid ETOH and tylenol.   There is no change in bowel or bladder habits.  Overall, I would say his performance status is ECOG 0.    Wt Readings from Last 3 Encounters:  08/18/23 238 lb (108 kg)  07/14/23 240 lb (108.9 kg)  05/16/23 235 lb (106.6 kg)    Medications:  Current Outpatient Medications:    acetaminophen (TYLENOL) 500 MG tablet, Take 500 mg by mouth every 6 (six) hours as needed., Disp: , Rfl:    Cholecalciferol (VITAMIN D3) 125 MCG (5000 UT) CAPS, Take 5,000 Units by mouth daily., Disp: , Rfl:    cyclobenzaprine (FLEXERIL) 10 MG tablet, TAKE ONE TABLET BY MOUTH THREE TIMES A DAY AS NEEDED FOR MUSCLE SPASMS, Disp: 90 tablet, Rfl: 1   diclofenac (VOLTAREN) 75 MG EC tablet, Take 75 mg by mouth 2 (two) times daily., Disp: , Rfl:    folic acid (FOLVITE) 1 MG tablet, TAKE 1 TABLET BY MOUTH DAILY, Disp: 90 tablet, Rfl: 1   Glucosamine-Chondroit-Vit C-Mn (GLUCOSAMINE 1500 COMPLEX PO), Take 2,000 mg by mouth daily., Disp: , Rfl:    ibuprofen (ADVIL) 200 MG tablet, Take 200 mg by mouth every 6 (six) hours as needed for mild pain., Disp: , Rfl:    levothyroxine (SYNTHROID) 137 MCG tablet, Take 1 tablet by mouth daily on Monday, Wednesday, Friday., Disp: 90 tablet, Rfl: 3   levothyroxine (SYNTHROID) 150 MCG  tablet, TAKE ONE TABLET BY MOUTH ON TUESDAY, THURSDAY, SATURDAY, AND SUNDAY, Disp: 16 tablet, Rfl: 3   loratadine (CLARITIN) 10 MG tablet, Take 10 mg by mouth daily as needed for allergies., Disp: , Rfl:    Multiple Vitamin (MULTIVITAMIN) capsule, Take 1 capsule by mouth daily. , Disp: , Rfl:    Omega-3 Fatty Acids (FISH OIL) 1200 MG CAPS, Take 1,200 mg by mouth daily., Disp: , Rfl:    rosuvastatin (CRESTOR) 10 MG tablet, TAKE 1 TABLET BY MOUTH DAILY, Disp: 90 tablet, Rfl: 3   sildenafil (REVATIO) 20 MG tablet, Use 5 tablets PO as needed, Disp: 90 tablet, Rfl: 3  Allergies: No Known Allergies  Past Medical History, Surgical history, Social history, and Family History were reviewed and updated.  Review of Systems: Review of Systems  Constitutional: Negative.   HENT:  Negative.    Eyes: Negative.   Respiratory: Negative.    Cardiovascular: Negative.   Gastrointestinal: Negative.   Endocrine: Negative.   Genitourinary: Negative.    Musculoskeletal: Negative.   Skin: Negative.   Neurological: Negative.   Hematological: Negative.   Psychiatric/Behavioral: Negative.      Physical Exam:  height is 6\' 1"  (1.854 m) and weight is 238 lb (108 kg). His oral temperature is 98 F (36.7 C). His blood pressure is 141/72 (abnormal) and his pulse is 56 (abnormal). His respiration is 18 and oxygen saturation is 98%.   Wt Readings from Last  3 Encounters:  08/18/23 238 lb (108 kg)  07/14/23 240 lb (108.9 kg)  05/16/23 235 lb (106.6 kg)    Physical Exam Vitals reviewed.  HENT:     Head: Normocephalic and atraumatic.  Eyes:     Pupils: Pupils are equal, round, and reactive to light.  Cardiovascular:     Rate and Rhythm: Normal rate and regular rhythm.     Heart sounds: Normal heart sounds.  Pulmonary:     Effort: Pulmonary effort is normal.     Breath sounds: Normal breath sounds.  Abdominal:     General: Bowel sounds are normal.     Palpations: Abdomen is soft.  Musculoskeletal:         General: No tenderness or deformity. Normal range of motion.     Cervical back: Normal range of motion.  Lymphadenopathy:     Cervical: No cervical adenopathy.  Skin:    General: Skin is warm and dry.     Findings: No erythema or rash.  Neurological:     Mental Status: He is alert and oriented to person, place, and time.  Psychiatric:        Behavior: Behavior normal.        Thought Content: Thought content normal.        Judgment: Judgment normal.      Lab Results  Component Value Date   WBC 3.8 (L) 08/15/2023   HGB 13.2 08/15/2023   HCT 38.6 (L) 08/15/2023   MCV 100.0 08/15/2023   PLT 132 (L) 08/15/2023     Chemistry      Component Value Date/Time   NA 134 (L) 08/15/2023 0901   K 4.4 08/15/2023 0901   CL 101 08/15/2023 0901   CO2 27 08/15/2023 0901   BUN 13 08/15/2023 0901   CREATININE 0.99 08/15/2023 0901   CREATININE 1.02 11/17/2020 0848      Component Value Date/Time   CALCIUM 10.3 08/15/2023 0901   ALKPHOS 76 08/15/2023 0901   AST 28 08/15/2023 0901   ALT 27 08/15/2023 0901   BILITOT 0.5 08/15/2023 0901      Impression and Plan: Timothy Grant is a very nice 77 year old white male.  He has mild thrombocytopenia.  Review of his labs from 08/15/2023 show some improvements in his WBC (3.8 from 3.3) ANC 2.5 from 1.8. Platelets have improved some from 130 to 132. Hgb has now normalized to 13.2 from 12.2. He now has a normal CRP, elevated B12 of 2780, normal folate, ferritin of 72, iron saturation of 19%, elevated sed rate of 38, low/normal erythropoietin of 18.2. He will reduce his B12 supplement to 1,000 I.U once daily. We discussed rheumatology referral given his elevated sed rate with history of elevated sed rate x 3 however he defers at this time. We also discussed his history of Hepatitis A and how he would benefit from an updated liver US- I have placed this order today.   Disposition Liver US order placed RTC 3 month labs RTC 3 month + 3-7 days for APP  only -[place]    Rushie Chestnut, [place] 9/12/202411:34 AM

## 2023-08-25 ENCOUNTER — Ambulatory Visit (HOSPITAL_BASED_OUTPATIENT_CLINIC_OR_DEPARTMENT_OTHER)
Admission: RE | Admit: 2023-08-25 | Discharge: 2023-08-25 | Disposition: A | Discharge status: Home / Self Care | Payer: Medicare Other | Source: Ambulatory Visit | Attending: Medical Oncology | Admitting: Medical Oncology

## 2023-08-25 DIAGNOSIS — K7460 Unspecified cirrhosis of liver: Secondary | ICD-10-CM | POA: Diagnosis not present

## 2023-08-25 DIAGNOSIS — Z8619 Personal history of other infectious and parasitic diseases: Secondary | ICD-10-CM | POA: Diagnosis not present

## 2023-08-25 DIAGNOSIS — Z9049 Acquired absence of other specified parts of digestive tract: Secondary | ICD-10-CM | POA: Diagnosis not present

## 2023-08-25 DIAGNOSIS — K7689 Other specified diseases of liver: Secondary | ICD-10-CM | POA: Diagnosis not present

## 2023-08-26 ENCOUNTER — Other Ambulatory Visit: Payer: Self-pay | Admitting: Medical Oncology

## 2023-08-26 DIAGNOSIS — D72819 Decreased white blood cell count, unspecified: Secondary | ICD-10-CM

## 2023-08-26 DIAGNOSIS — D696 Thrombocytopenia, unspecified: Secondary | ICD-10-CM

## 2023-08-26 DIAGNOSIS — K7469 Other cirrhosis of liver: Secondary | ICD-10-CM

## 2023-09-22 ENCOUNTER — Telehealth: Payer: Self-pay | Admitting: Internal Medicine

## 2023-09-22 ENCOUNTER — Telehealth: Payer: Self-pay | Admitting: *Deleted

## 2023-09-22 NOTE — Telephone Encounter (Signed)
Faxed referral to Dr. Yancey Flemings [phone]

## 2023-09-22 NOTE — Telephone Encounter (Signed)
Inbound call from [facility] in [place] calling in regards to urgent referral sent for patient for  Leukopenia and Thrombocytopenia. Advised them Dr. Lamar Sprinkles next available would be in February. Stated patient needed to be seen sooner.

## 2023-09-22 NOTE — Telephone Encounter (Signed)
Pt scheduled to see Dr. Marina Goodell 09/27/23 at 9am.

## 2023-09-23 NOTE — Telephone Encounter (Signed)
Please see below and let pt know about the appt.

## 2023-09-23 NOTE — Telephone Encounter (Signed)
Pt scheduled to see Quentin Mulling PA 10/04/23 at 11:30am as Dr. Marina Goodell does not have anything until late December. Please let pt know about appt.

## 2023-09-23 NOTE — Telephone Encounter (Signed)
Called patient to advise on appointment. Left voicemail to call back.

## 2023-09-23 NOTE — Telephone Encounter (Signed)
Inbound call from patient stating he needs to rescheduled 10/22 appointment due to being out of town. Patient requesting a call to discuss what else is available. Please advise, thank you.

## 2023-09-27 ENCOUNTER — Ambulatory Visit: Payer: Medicare Other | Admitting: Internal Medicine

## 2023-10-03 NOTE — Progress Notes (Unsigned)
10/04/2023 Maxsim Bangerter [id number] 1946-06-18  Referring provider: Willow Ora, MD Primary GI doctor: Dr. Marina Goodell  ASSESSMENT AND PLAN:     Liver Disease History of Hepatitis A in 2020 with persistent elevation of liver enzymes. Ultrasound 08/2023 shows nodular liver suggestive of progression from steatosis to cirrhosis. No signs of hepatic encephalopathy or ascites. - will check autoimmune work up, though possible from Hep A/steatosis. -Check Hepatitis B and C status, consider Hep B vaccinations if no immunity -Order labs to assess liver function including INR -Schedule endoscopy to evaluate for esophageal varices with worsening thrombocytopenia -Advise patient to abstain from alcohol and avoid NSAIDs. -Plan for ultrasound follow-up in March 2025 for [facility] screening. Will check AFP. - follow up 3-6 months  Thrombocytopenia Chronic low platelet count, possibly related to liver disease. -Continue follow-up with hematology.  General Health Maintenance -If Hepatitis B immunity is not confirmed, administer Hepatitis B vaccine. -Advise patient to limit Tylenol use to less than 3000mg  per day. -Schedule follow-up appointment to discuss lab results and further management.         Patient Care Team: Willow Ora, MD as PCP - General (Family Medicine) Rodrigo Ran, [place] (Ophthalmology) Cindee Salt, MD as Consulting Physician (Orthopedic Surgery) [place] (Dentistry) Tressia Danas, MD (Inactive) as Consulting Physician (Gastroenterology) Arthur Holms, MD (Inactive) as Consulting Physician (Hematology) Dahlia Byes, [facility] (Inactive) as Pharmacist (Pharmacist)  HISTORY OF PRESENT ILLNESS: 77 y.o. male with medical history significant for acquired hypothyroidism, hyperlipidemia, SIADH, pancytopenia, history of hepatitis A, personal history of adenomatous colon polyps, status post cholecystectomy presents for evaluation of cirrhosis.  Last colonoscopy 05/16/2023  diverticulosis left and right colon otherwise unremarkable Patient has never had an EGD. Has a longstanding history of elevated LFTs. 05/21/2020 elastography by Dr. Orvan Falconer for elevated LFTs showed small gallstone diffuse coarsening hepatic echotexture suspicious for diffuse hepatocellular disease median K PA 6.4 11/07/2020 patient had abdominal ultrasound by Dr. Orvan Falconer for elevated liver enzymes that showed hepatic steatosis without focal mass, he also had gallbladder polyps so was referred to surgery, spleen unremarkable 08/25/2023 right upper quadrant ultrasound by hematology for hepatitis A showed status post cholecystectomy but coarsened echogenicity and nodular contour of the liver with no lesions.  Patient's had pancytopenia with thrombocytopenia since at least 2020 for which she follows the hematologist.  In August of this year his platelets were lower than his baseline.   Due to history of hepatitis A and pancytopenia scheduled for ultrasound on 08/25/2023 which showed coarsened nodular contour of liver with no lesions. 08/15/2023 Hgb 13.2, MCV 100, platelets 132, WBC 3.8, normal kidney liver, previously elevated AST ALT March 2024 with AST 43 and ALT 64.  Unremarkable iron TIBC, normal erythropoietin, sed rate slightly  They did at 38 negative CRP. 2021 INR 1.2, 2020 appears he had a workup at [facility] for elevated LFTs IgG 2165 elevated, anti-smooth muscle 21, negative AMA, ANA, ferritin.  Discussed the use of AI scribe software for clinical note transcription with the patient, who gave verbal consent to proceed.  The patient, with a history of Hepatitis A contracted in late 2019, has been under long-term monitoring due to consistently elevated liver functions. He was previously diagnosed with fatty liver (steatosis) in 2021, with no fibrosis noted at that time. However, a recent ultrasound ordered by his hematologist due to persistent low blood counts (pancytopenia) revealed a nodular liver. The  patient has been experiencing low white blood cell count, red blood cell count, and platelets for an  extended period, which has been attributed to being a low platelet producer.  The patient has a history of jaundice and rhabdomyolysis, which was thought to be related to his Crestor dose. He has not reported any recent symptoms of confusion, slowed speech, or physical slowing. He denies any shortness of breath and reports no issues with walking. He has not noticed any swelling in his stomach or legs, except for some swelling in the knee due to OA. The patient has a history of occasional alcohol use, with the most recent intake reported last week during a golf trip. He occasionally takes diclofenac for pain related to heavy yard work. He has not been using ibuprofen recently. The patient is unsure about his Hepatitis vaccination status. He has no family history of liver issues.   Wt Readings from Last 3 Encounters:  10/04/23 237 lb (107.5 kg)  08/18/23 238 lb (108 kg)  07/14/23 240 lb (108.9 kg)   Social history:  He  reports that he has quit smoking. His smoking use included cigarettes and cigars. He has never used smokeless tobacco. He reports current alcohol use of about 6.0 standard drinks of alcohol per week. He reports that he does not use drugs.  RELEVANT LABS AND IMAGING:  Results   RADIOLOGY Elastography: Fatty liver, no fibrosis (2021) Abdominal ultrasound: Steatosis, unremarkable spleen (11/2020) Abdominal ultrasound: Nodular liver      CBC    Component Value Date/Time   WBC 3.8 (L) 08/15/2023 0901   WBC 3.3 (L) 07/14/2023 0937   RBC 3.86 (L) 08/15/2023 0901   HGB 13.2 08/15/2023 0901   HCT 38.6 (L) 08/15/2023 0901   PLT 132 (L) 08/15/2023 0901   MCV 100.0 08/15/2023 0901   MCH 34.2 (H) 08/15/2023 0901   MCHC 34.2 08/15/2023 0901   RDW 12.5 08/15/2023 0901   LYMPHSABS 0.7 08/15/2023 0901   MONOABS 0.5 08/15/2023 0901   EOSABS 0.1 08/15/2023 0901   BASOSABS 0.0  08/15/2023 0901   Recent Labs    01/13/23 0957 02/07/23 1500 07/14/23 0937 08/15/23 0901  HGB 13.0 13.5 12.2* 13.2    CMP     Component Value Date/Time   NA 134 (L) 08/15/2023 0901   K 4.4 08/15/2023 0901   CL 101 08/15/2023 0901   CO2 27 08/15/2023 0901   GLUCOSE 124 (H) 08/15/2023 0901   BUN 13 08/15/2023 0901   CREATININE 0.99 08/15/2023 0901   CREATININE 1.02 11/17/2020 0848   CALCIUM 10.3 08/15/2023 0901   PROT 7.4 08/15/2023 0901   ALBUMIN 4.5 08/15/2023 0901   AST 28 08/15/2023 0901   ALT 27 08/15/2023 0901   ALKPHOS 76 08/15/2023 0901   BILITOT 0.5 08/15/2023 0901   GFRNONAA >60 08/15/2023 0901   GFRNONAA 72 11/17/2020 0848   GFRAA 83 11/17/2020 0848      Latest Ref Rng & Units 08/15/2023    9:01 AM 07/14/2023    9:37 AM 02/07/2023    3:00 PM  Hepatic Function  Total Protein 6.5 - 8.1 g/dL 7.4  7.3  6.9   Albumin 3.5 - 5.0 g/dL 4.5  4.3  4.1   AST 15 - 41 U/L 28  28  43   ALT 0 - 44 U/L 27  32  64   Alk Phosphatase 38 - 126 U/L 76  84  85   Total Bilirubin 0.3 - 1.2 mg/dL 0.5  0.6  0.8       Current Medications:   Current Outpatient Medications (Endocrine &  Metabolic):    levothyroxine (SYNTHROID) 137 MCG tablet, Take 1 tablet by mouth daily on Monday, Wednesday, Friday.   levothyroxine (SYNTHROID) 150 MCG tablet, TAKE ONE TABLET BY MOUTH ON TUESDAY, THURSDAY, SATURDAY, AND SUNDAY  Current Outpatient Medications (Cardiovascular):    rosuvastatin (CRESTOR) 10 MG tablet, TAKE 1 TABLET BY MOUTH DAILY   sildenafil (REVATIO) 20 MG tablet, Use 5 tablets PO as needed  Current Outpatient Medications (Respiratory):    loratadine (CLARITIN) 10 MG tablet, Take 10 mg by mouth daily as needed for allergies.  Current Outpatient Medications (Analgesics):    acetaminophen (TYLENOL) 500 MG tablet, Take 500 mg by mouth every 6 (six) hours as needed.   diclofenac (VOLTAREN) 75 MG EC tablet, Take 75 mg by mouth 2 (two) times daily.   ibuprofen (ADVIL) 200 MG tablet, Take  200 mg by mouth every 6 (six) hours as needed for mild pain.  Current Outpatient Medications (Hematological):    folic acid (FOLVITE) 1 MG tablet, TAKE 1 TABLET BY MOUTH DAILY  Current Outpatient Medications (Other):    Cholecalciferol (VITAMIN D3) 125 MCG (5000 UT) CAPS, Take 5,000 Units by mouth daily.   cyclobenzaprine (FLEXERIL) 10 MG tablet, TAKE ONE TABLET BY MOUTH THREE TIMES A DAY AS NEEDED FOR MUSCLE SPASMS   Glucosamine-Chondroit-Vit C-Mn (GLUCOSAMINE 1500 COMPLEX PO), Take 2,000 mg by mouth daily.   Multiple Vitamin (MULTIVITAMIN) capsule, Take 1 capsule by mouth daily.    Omega-3 Fatty Acids (FISH OIL) 1200 MG CAPS, Take 1,200 mg by mouth daily.  Medical History:  Past Medical History:  Diagnosis Date   Adenomatous polyps    Allergy    seasonal   Arthritis    Cardiomegaly    Cataract    Cirrhosis [facility])    ED (erectile dysfunction)    Gallbladder polyp    Glaucoma 03/2020   no tx as of yet   Hepatitis A    Hyperlipidemia    Hypothyroidism    Non-traumatic rhabdomyolysis 02/21/2019   Thrombocytopenia (HCC)    Varicocele 02/01/2011   Left testicle   Allergies: No Known Allergies   Surgical History:  He  has a past surgical history that includes Tonsillectomy; Eye surgery (Right, 1956); Eye surgery (Left, 1963); Liver biopsy; Colonoscopy (2014); Polypectomy; and Cholecystectomy (N/A, 01/30/2021). Family History:  His family history includes Alzheimer's disease in his mother; Arthritis in his mother; Breast cancer in his brother; Colon polyps in his brother; Dementia in his mother; Heart disease in his father and paternal grandmother; Hyperlipidemia in his brother; Lung cancer in his brother; Stroke in his brother and mother; Uterine cancer in his sister.  REVIEW OF SYSTEMS  : All other systems reviewed and negative except where noted in the History of Present Illness.  PHYSICAL EXAM: BP 122/74   Pulse 64   Ht 6\' 1"  (1.854 m)   Wt 237 lb (107.5 kg)   BMI 31.27  kg/m  General :  Alert, well developed male in no acute distress Head:  Normocephalic and atraumatic. Eyes :  sclerae anicteric,conjunctive pink  Heart:  regular rate and rhythm Pulm:  Clear anteriorly; no wheezing Abdomen:   Soft, Obese AB, skin exam normal, Normal bowel sounds.  no  tenderness, no  fluid wave, no  shifting dullness.  Extremities:   Without edema. Msk:  Symmetrical without gross deformities. Peripheral pulses intact.  Neurologic: Alert and  oriented x4;  grossly normal neurologically. without asterixis or clonus.  Skin:   without jaundice.  Psychiatric:  Demonstrates good  judgement and reason without abnormal affect or behaviors.    Doree Albee, PA-C 12:44 PM

## 2023-10-04 ENCOUNTER — Encounter: Payer: Self-pay | Admitting: Physician Assistant

## 2023-10-04 ENCOUNTER — Ambulatory Visit: Payer: Medicare Other | Admitting: Physician Assistant

## 2023-10-04 ENCOUNTER — Other Ambulatory Visit (INDEPENDENT_AMBULATORY_CARE_PROVIDER_SITE_OTHER): Payer: Medicare Other

## 2023-10-04 VITALS — BP 122/74 | HR 64 | Ht 73.0 in | Wt 237.0 lb

## 2023-10-04 DIAGNOSIS — K7460 Unspecified cirrhosis of liver: Secondary | ICD-10-CM

## 2023-10-04 DIAGNOSIS — D126 Benign neoplasm of colon, unspecified: Secondary | ICD-10-CM | POA: Diagnosis not present

## 2023-10-04 DIAGNOSIS — D696 Thrombocytopenia, unspecified: Secondary | ICD-10-CM

## 2023-10-04 DIAGNOSIS — Z8619 Personal history of other infectious and parasitic diseases: Secondary | ICD-10-CM

## 2023-10-04 LAB — CBC WITH DIFFERENTIAL/PLATELET
Basophils Absolute: 0 10*3/uL (ref 0.0–0.1)
Basophils Relative: 0.7 % (ref 0.0–3.0)
Eosinophils Absolute: 0.1 10*3/uL (ref 0.0–0.7)
Eosinophils Relative: 2 % (ref 0.0–5.0)
HCT: 40.6 % (ref 39.0–52.0)
Hemoglobin: 13.6 g/dL (ref 13.0–17.0)
Lymphocytes Relative: 21.4 % (ref 12.0–46.0)
Lymphs Abs: 0.7 10*3/uL (ref 0.7–4.0)
MCHC: 33.5 g/dL (ref 30.0–36.0)
MCV: 99.5 fL (ref 78.0–100.0)
Monocytes Absolute: 0.4 10*3/uL (ref 0.1–1.0)
Monocytes Relative: 11.3 % (ref 3.0–12.0)
Neutro Abs: 2 10*3/uL (ref 1.4–7.7)
Neutrophils Relative %: 64.6 % (ref 43.0–77.0)
Platelets: 133 10*3/uL — ABNORMAL LOW (ref 150.0–400.0)
RBC: 4.08 Mil/uL — ABNORMAL LOW (ref 4.22–5.81)
RDW: 13.5 % (ref 11.5–15.5)
WBC: 3.2 10*3/uL — ABNORMAL LOW (ref 4.0–10.5)

## 2023-10-04 LAB — IBC + FERRITIN
Ferritin: 73.8 ng/mL (ref 22.0–322.0)
Iron: 104 ug/dL (ref 42–165)
Saturation Ratios: 25.2 % (ref 20.0–50.0)
TIBC: 413 ug/dL (ref 250.0–450.0)
Transferrin: 295 mg/dL (ref 212.0–360.0)

## 2023-10-04 LAB — BASIC METABOLIC PANEL
BUN: 17 mg/dL (ref 6–23)
CO2: 24 meq/L (ref 19–32)
Calcium: 9.8 mg/dL (ref 8.4–10.5)
Chloride: 102 meq/L (ref 96–112)
Creatinine, Ser: 1.04 mg/dL (ref 0.40–1.50)
GFR: 68.93 mL/min (ref >60.00)
Glucose, Bld: 99 mg/dL (ref 70–99)
Potassium: 4.4 meq/L (ref 3.5–5.1)
Sodium: 133 meq/L — ABNORMAL LOW (ref 135–145)

## 2023-10-04 LAB — HEPATIC FUNCTION PANEL
ALT: 44 U/L (ref 0–53)
AST: 40 U/L — ABNORMAL HIGH (ref 0–37)
Albumin: 4.4 g/dL (ref 3.5–5.2)
Alkaline Phosphatase: 81 U/L (ref 39–117)
Bilirubin, Direct: 0.1 mg/dL (ref 0.0–0.3)
Total Bilirubin: 0.7 mg/dL (ref 0.2–1.2)
Total Protein: 7.5 g/dL (ref 6.0–8.3)

## 2023-10-04 LAB — PROTIME-INR
INR: 1.1 {ratio} — ABNORMAL HIGH (ref 0.8–1.0)
Prothrombin Time: 11.8 s (ref 9.6–13.1)

## 2023-10-04 NOTE — Progress Notes (Signed)
Noted  

## 2023-10-04 NOTE — Patient Instructions (Addendum)
Your provider has requested that you go to the basement level for lab work before leaving today. Press "B" on the elevator. The lab is located at the first door on the left as you exit the elevator. You should have an imaging study every 6 months to monitor for the development of hepatocellular carcinoma (liver cancer). The risk is low, but, if liver cancer is diagnosed early, there are better treatment options. Will get AFP, INR, CBC, and CMET.  Will follow up in 6 months to check labs and evaluate.   You have been scheduled for an endoscopy. Please follow written instructions given to you at your visit today.  If you use inhalers (even only as needed), please bring them with you on the day of your procedure.  If you take any of the following medications, they will need to be adjusted prior to your procedure:   DO NOT TAKE 7 DAYS PRIOR TO TEST- Trulicity (dulaglutide) Ozempic, Wegovy (semaglutide) Mounjaro (tirzepatide) Bydureon Bcise (exanatide extended release)  DO NOT TAKE 1 DAY PRIOR TO YOUR TEST Rybelsus (semaglutide) Adlyxin (lixisenatide) Victoza (liraglutide) Byetta (exanatide) ___________________________________________________________________________     I recommend a high-protein, primarily plant-based diet. Avoid red meat. Work to maintain a health weight. Weigh yourself daily- call if you have weight gain of greater than 5 lbs in a 1-2 days, leg swelling, or new swelling in your abdomen. Minimize salt intake- VERY important. Please do not consume more than 2000 mg of sodium every day. Monitor your blood pressure at home.  Stay active. Weight-based exercise for 30 minutes at least 3 days a week is recommended. I recommend that you not drink any alcohol including beer, wine, liquor, and non-alcoholic beer.   You are at increased risk of osteopenia and osteoporosis. You should be screened for these metabolic bone diseases if you have not already had the testing  performed.  Cirrhosis Cirrhosis is long-term (chronic) liver injury. The liver is the body's largest internal organ, and it performs many functions. It converts food into energy, removes toxic material from the blood, makes important proteins, and absorbs necessary vitamins from food. In cirrhosis, healthy liver cells are replaced by scar tissue. This prevents blood from flowing through the liver and makes it difficult for the liver to complete its functions. What are the causes? Common causes of this condition are hepatitis C and long-term alcohol abuse. Other causes include: Nonalcoholic fatty liver disease (NAFLD). This happens when fat is deposited in the liver by causes other than alcohol. Hepatitis B infection. Autoimmune hepatitis. In this condition, the body's defense system (immune system) mistakenly attacks the liver cells, causing inflammation. Diseases that cause blockage of ducts inside the liver. Inherited liver diseases, such as hemochromatosis. This is one of the most common inherited liver diseases. In this disease, deposits of iron collect in the liver and other organs. Reactions to certain long-term medicines, such as amiodarone, a heart medicine. Parasitic infections. These include schistosomiasis, which is caused by a flatworm. Long-term contact to certain toxins. These toxins include certain organic solvents, such as toluene and chloroform. What increases the risk? You are more likely to develop this condition if: You have certain types of viral hepatitis. You abuse alcohol, especially if you are male. You are overweight. You use IV drugs and share needles. You have unprotected sex with someone who has viral hepatitis. What are the signs or symptoms? You may not have any signs and symptoms at first. Symptoms may not develop until the damage to your  liver starts to get worse. Early symptoms may include: Weakness and tiredness (fatigue). Changes in sleep patterns or  having trouble sleeping. Itchiness. Tenderness in the right-upper part of your abdomen. Weight loss and muscle loss. Nausea. Loss of appetite. Later symptoms may include: Fatigue or weakness that is getting worse. Yellow skin and eyes (jaundice). Buildup of fluid in the abdomen (ascites). You may notice that your clothes are tight around your waist. Weight gain and swelling of the feet and ankles (edema). Trouble breathing. Easy bruising and bleeding. Vomiting blood, or black or bloody stool. Mental confusion. How is this diagnosed? Your health care provider may suspect cirrhosis based on your symptoms and medical history, especially if you have other medical conditions or a history of alcohol abuse. Your health care provider will do a physical exam to feel your liver and to check for signs of cirrhosis. Tests may include: Blood tests to check: For hepatitis B or C. Kidney function. Liver function. Imaging tests such as: MRI or CT scan to look for changes seen in advanced cirrhosis. Ultrasound to see if normal liver tissue is being replaced by scar tissue. A procedure in which a long needle is used to take a sample of liver tissue to be checked in a lab (biopsy). Liver biopsy can confirm the diagnosis of cirrhosis. How is this treated? Treatment for this condition depends on how damaged your liver is and what caused the damage. It may include treating the symptoms of cirrhosis, or treating the underlying causes to slow the damage. Treatment may include: Making lifestyle changes, such as: Eating a healthy diet. You may need to work with your health care provider or a dietitian to develop an eating plan. Restricting salt intake. Maintaining a healthy weight. Not abusing drugs or alcohol. Taking medicines to: Treat liver infections or other infections. Control itching. Reduce fluid buildup. Reduce certain blood toxins. Reduce risk of bleeding from enlarged blood vessels in the  stomach or esophagus (varices). Liver transplant. In this procedure, a liver from a donor is used to replace your diseased liver. This is done if cirrhosis has caused liver failure. Other treatments and procedures may be done depending on the problems that you get from cirrhosis. Common problems include liver-related kidney failure (hepatorenal syndrome). Follow these instructions at home:  Take medicines only as told by your health care provider. Do not use medicines that are toxic to your liver. Ask your health care provider before taking any new medicines, including over-the-counter medicines such as NSAIDs. Rest as needed. Eat a well-balanced diet. Limit your salt or water intake, if your health care provider asks you to do this. Do not drink alcohol. This is especially important if you routinely take acetaminophen. Keep all follow-up visits. This is important. Contact a health care provider if you: Have fatigue or weakness that is getting worse. Develop swelling of the hands, feet, or legs, or a buildup of fluid in the abdomen (ascites). Have a fever or chills. Develop loss of appetite. Have nausea or vomiting. Develop jaundice. Develop easy bruising or bleeding. Get help right away if you: Vomit bright red blood or a material that looks like coffee grounds. Have blood in your stools. Notice that your stools appear black and tarry. Become confused. Have chest pain or trouble breathing. These symptoms may represent a serious problem that is an emergency. Do not wait to see if the symptoms will go away. Get medical help right away. Call your local emergency services (911 in the [place]  Do not drive yourself to the hospital. Summary Cirrhosis is chronic liver injury. Common causes are hepatitis C and long-term alcohol abuse. Tests used to diagnose cirrhosis include blood tests, imaging tests, and liver biopsy. Treatment for this condition involves treating the underlying cause. Avoid  alcohol, drugs, salt, and medicines that may damage your liver. Get help right away if you vomit bright red blood or a material that looks like coffee grounds. This information is not intended to replace advice given to you by your health care provider. Make sure you discuss any questions you have with your health care provider. Document Revised: 09/04/2020 Document Reviewed: 09/04/2020 Elsevier Patient Education  2022 [employer].  I appreciate the opportunity to care for you. Quentin Mulling PA-C

## 2023-10-11 LAB — IGG: IgG (Immunoglobin G), Serum: 981 mg/dL (ref 600–1540)

## 2023-10-11 LAB — MITOCHONDRIAL ANTIBODIES: Mitochondrial M2 Ab, IgG: <=20 U (ref <=20.0)

## 2023-10-11 LAB — AFP TUMOR MARKER: AFP-Tumor Marker: 4.1 ng/mL (ref <6.1)

## 2023-10-11 LAB — ANTI-NUCLEAR AB-TITER (ANA TITER): ANA Titer 1: 1:40 {titer} — ABNORMAL HIGH

## 2023-10-11 LAB — ANA: Anti Nuclear Antibody (ANA): POSITIVE — AB

## 2023-10-11 LAB — HEPATITIS C ANTIBODY: Hepatitis C Ab: NONREACTIVE

## 2023-10-11 LAB — HEPATITIS B SURFACE ANTIGEN: Hepatitis B Surface Ag: NONREACTIVE

## 2023-10-11 LAB — ANTI-SMOOTH MUSCLE ANTIBODY, IGG: Actin (Smooth Muscle) Antibody (IGG): <20 U (ref <20)

## 2023-10-11 LAB — HEPATITIS B SURFACE ANTIBODY,QUALITATIVE: Hep B S Ab: NONREACTIVE

## 2023-10-24 ENCOUNTER — Encounter: Payer: Self-pay | Admitting: Internal Medicine

## 2023-11-01 ENCOUNTER — Other Ambulatory Visit: Payer: Self-pay | Admitting: Family Medicine

## 2023-11-01 DIAGNOSIS — E039 Hypothyroidism, unspecified: Secondary | ICD-10-CM

## 2023-11-05 ENCOUNTER — Other Ambulatory Visit: Payer: Self-pay | Admitting: Family Medicine

## 2023-11-10 ENCOUNTER — Encounter: Payer: Self-pay | Admitting: Internal Medicine

## 2023-11-10 ENCOUNTER — Ambulatory Visit: Payer: Medicare Other | Admitting: Internal Medicine

## 2023-11-10 VITALS — BP 128/80 | HR 51 | Temp 97.3°F | Resp 19 | Ht 73.0 in | Wt 237.0 lb

## 2023-11-10 DIAGNOSIS — K7460 Unspecified cirrhosis of liver: Secondary | ICD-10-CM

## 2023-11-10 MED ORDER — SODIUM CHLORIDE 0.9 % IV SOLN: 500.0000 mL | INTRAVENOUS | Status: AC

## 2023-11-10 NOTE — Progress Notes (Signed)
10/04/2023 Timothy Grant [id number] February 24, 1946   Referring provider: Willow Ora, MD Primary GI doctor: Dr. Marina Goodell   ASSESSMENT AND PLAN:     Liver Disease History of Hepatitis A in 2020 with persistent elevation of liver enzymes. Ultrasound 08/2023 shows nodular liver suggestive of progression from steatosis to cirrhosis. No signs of hepatic encephalopathy or ascites. - will check autoimmune work up, though possible from Hep A/steatosis. -Check Hepatitis B and C status, consider Hep B vaccinations if no immunity -Order labs to assess liver function including INR -Schedule endoscopy to evaluate for esophageal varices with worsening thrombocytopenia -Advise patient to abstain from alcohol and avoid NSAIDs. -Plan for ultrasound follow-up in March 2025 for [facility] screening. Will check AFP. - follow up 3-6 months   Thrombocytopenia Chronic low platelet count, possibly related to liver disease. -Continue follow-up with hematology.   General Health Maintenance -If Hepatitis B immunity is not confirmed, administer Hepatitis B vaccine. -Advise patient to limit Tylenol use to less than 3000mg  per day. -Schedule follow-up appointment to discuss lab results and further management.             Patient Care Team: Willow Ora, MD as PCP - General (Family Medicine) Rodrigo Ran, [place] (Ophthalmology) Cindee Salt, MD as Consulting Physician (Orthopedic Surgery) [place] (Dentistry) Tressia Danas, MD (Inactive) as Consulting Physician (Gastroenterology) Arthur Holms, MD (Inactive) as Consulting Physician (Hematology) Dahlia Byes, [facility] (Inactive) as Pharmacist (Pharmacist)   HISTORY OF PRESENT ILLNESS: 77 y.o. male with medical history significant for acquired hypothyroidism, hyperlipidemia, SIADH, pancytopenia, history of hepatitis A, personal history of adenomatous colon polyps, status post cholecystectomy presents for evaluation of cirrhosis.   Last colonoscopy 05/16/2023  diverticulosis left and right colon otherwise unremarkable Patient has never had an EGD. Has a longstanding history of elevated LFTs. 05/21/2020 elastography by Dr. Orvan Falconer for elevated LFTs showed small gallstone diffuse coarsening hepatic echotexture suspicious for diffuse hepatocellular disease median K PA 6.4 11/07/2020 patient had abdominal ultrasound by Dr. Orvan Falconer for elevated liver enzymes that showed hepatic steatosis without focal mass, he also had gallbladder polyps so was referred to surgery, spleen unremarkable 08/25/2023 right upper quadrant ultrasound by hematology for hepatitis A showed status post cholecystectomy but coarsened echogenicity and nodular contour of the liver with no lesions.   Patient's had pancytopenia with thrombocytopenia since at least 2020 for which she follows the hematologist.  In August of this year his platelets were lower than his baseline.   Due to history of hepatitis A and pancytopenia scheduled for ultrasound on 08/25/2023 which showed coarsened nodular contour of liver with no lesions. 08/15/2023 Hgb 13.2, MCV 100, platelets 132, WBC 3.8, normal kidney liver, previously elevated AST ALT March 2024 with AST 43 and ALT 64.  Unremarkable iron TIBC, normal erythropoietin, sed rate slightly  They did at 38 negative CRP. 2021 INR 1.2, 2020 appears he had a workup at [facility] for elevated LFTs IgG 2165 elevated, anti-smooth muscle 21, negative AMA, ANA, ferritin.   Discussed the use of AI scribe software for clinical note transcription with the patient, who gave verbal consent to proceed.   The patient, with a history of Hepatitis A contracted in late 2019, has been under long-term monitoring due to consistently elevated liver functions. He was previously diagnosed with fatty liver (steatosis) in 2021, with no fibrosis noted at that time. However, a recent ultrasound ordered by his hematologist due to persistent low blood counts (pancytopenia) revealed a nodular liver. The  patient has been experiencing low white blood  cell count, red blood cell count, and platelets for an extended period, which has been attributed to being a low platelet producer.   The patient has a history of jaundice and rhabdomyolysis, which was thought to be related to his Crestor dose. He has not reported any recent symptoms of confusion, slowed speech, or physical slowing. He denies any shortness of breath and reports no issues with walking. He has not noticed any swelling in his stomach or legs, except for some swelling in the knee due to OA. The patient has a history of occasional alcohol use, with the most recent intake reported last week during a golf trip. He occasionally takes diclofenac for pain related to heavy yard work. He has not been using ibuprofen recently. The patient is unsure about his Hepatitis vaccination status. He has no family history of liver issues.       Wt Readings from Last 3 Encounters:  10/04/23 237 lb (107.5 kg)  08/18/23 238 lb (108 kg)  07/14/23 240 lb (108.9 kg)    Social history:  He  reports that he has quit smoking. His smoking use included cigarettes and cigars. He has never used smokeless tobacco. He reports current alcohol use of about 6.0 standard drinks of alcohol per week. He reports that he does not use drugs.   RELEVANT LABS AND IMAGING:   Results   RADIOLOGY Elastography: Fatty liver, no fibrosis (2021) Abdominal ultrasound: Steatosis, unremarkable spleen (11/2020) Abdominal ultrasound: Nodular liver       CBC Labs (Brief)          Component Value Date/Time    WBC 3.8 (L) 08/15/2023 0901    WBC 3.3 (L) 07/14/2023 0937    RBC 3.86 (L) 08/15/2023 0901    HGB 13.2 08/15/2023 0901    HCT 38.6 (L) 08/15/2023 0901    PLT 132 (L) 08/15/2023 0901    MCV 100.0 08/15/2023 0901    MCH 34.2 (H) 08/15/2023 0901    MCHC 34.2 08/15/2023 0901    RDW 12.5 08/15/2023 0901    LYMPHSABS 0.7 08/15/2023 0901    MONOABS 0.5 08/15/2023 0901    EOSABS  0.1 08/15/2023 0901    BASOSABS 0.0 08/15/2023 0901      Recent Labs (within last 365 days)        Recent Labs    01/13/23 0957 02/07/23 1500 07/14/23 0937 08/15/23 0901  HGB 13.0 13.5 12.2* 13.2        CMP     Labs (Brief)          Component Value Date/Time    NA 134 (L) 08/15/2023 0901    K 4.4 08/15/2023 0901    CL 101 08/15/2023 0901    CO2 27 08/15/2023 0901    GLUCOSE 124 (H) 08/15/2023 0901    BUN 13 08/15/2023 0901    CREATININE 0.99 08/15/2023 0901    CREATININE 1.02 11/17/2020 0848    CALCIUM 10.3 08/15/2023 0901    PROT 7.4 08/15/2023 0901    ALBUMIN 4.5 08/15/2023 0901    AST 28 08/15/2023 0901    ALT 27 08/15/2023 0901    ALKPHOS 76 08/15/2023 0901    BILITOT 0.5 08/15/2023 0901    GFRNONAA >60 08/15/2023 0901    GFRNONAA 72 11/17/2020 0848    GFRAA 83 11/17/2020 0848          Latest Ref Rng & Units 08/15/2023    9:01 AM 07/14/2023    9:37 AM 02/07/2023  3:00 PM  Hepatic Function  Total Protein 6.5 - 8.1 g/dL 7.4  7.3  6.9   Albumin 3.5 - 5.0 g/dL 4.5  4.3  4.1   AST 15 - 41 U/L 28  28  43   ALT 0 - 44 U/L 27  32  64   Alk Phosphatase 38 - 126 U/L 76  84  85   Total Bilirubin 0.3 - 1.2 mg/dL 0.5  0.6  0.8       Current Medications:    Current Outpatient Medications (Endocrine & Metabolic):    levothyroxine (SYNTHROID) 137 MCG tablet, Take 1 tablet by mouth daily on Monday, Wednesday, Friday.   levothyroxine (SYNTHROID) 150 MCG tablet, TAKE ONE TABLET BY MOUTH ON TUESDAY, THURSDAY, SATURDAY, AND SUNDAY   Current Outpatient Medications (Cardiovascular):    rosuvastatin (CRESTOR) 10 MG tablet, TAKE 1 TABLET BY MOUTH DAILY   sildenafil (REVATIO) 20 MG tablet, Use 5 tablets PO as needed   Current Outpatient Medications (Respiratory):    loratadine (CLARITIN) 10 MG tablet, Take 10 mg by mouth daily as needed for allergies.   Current Outpatient Medications (Analgesics):    acetaminophen (TYLENOL) 500 MG tablet, Take 500 mg by mouth every 6 (six)  hours as needed.   diclofenac (VOLTAREN) 75 MG EC tablet, Take 75 mg by mouth 2 (two) times daily.   ibuprofen (ADVIL) 200 MG tablet, Take 200 mg by mouth every 6 (six) hours as needed for mild pain.   Current Outpatient Medications (Hematological):    folic acid (FOLVITE) 1 MG tablet, TAKE 1 TABLET BY MOUTH DAILY   Current Outpatient Medications (Other):    Cholecalciferol (VITAMIN D3) 125 MCG (5000 UT) CAPS, Take 5,000 Units by mouth daily.   cyclobenzaprine (FLEXERIL) 10 MG tablet, TAKE ONE TABLET BY MOUTH THREE TIMES A DAY AS NEEDED FOR MUSCLE SPASMS   Glucosamine-Chondroit-Vit C-Mn (GLUCOSAMINE 1500 COMPLEX PO), Take 2,000 mg by mouth daily.   Multiple Vitamin (MULTIVITAMIN) capsule, Take 1 capsule by mouth daily.    Omega-3 Fatty Acids (FISH OIL) 1200 MG CAPS, Take 1,200 mg by mouth daily.   Medical History:      Past Medical History:  Diagnosis Date   Adenomatous polyps     Allergy      seasonal   Arthritis     Cardiomegaly     Cataract     Cirrhosis [facility])     ED (erectile dysfunction)     Gallbladder polyp     Glaucoma 03/2020    no tx as of yet   Hepatitis A     Hyperlipidemia     Hypothyroidism     Non-traumatic rhabdomyolysis 02/21/2019   Thrombocytopenia (HCC)     Varicocele 02/01/2011    Left testicle        Allergies:  Allergies  No Known Allergies      Surgical History:  He  has a past surgical history that includes Tonsillectomy; Eye surgery (Right, 1956); Eye surgery (Left, 1963); Liver biopsy; Colonoscopy (2014); Polypectomy; and Cholecystectomy (N/A, 01/30/2021). Family History:  His family history includes Alzheimer's disease in his mother; Arthritis in his mother; Breast cancer in his brother; Colon polyps in his brother; Dementia in his mother; Heart disease in his father and paternal grandmother; Hyperlipidemia in his brother; Lung cancer in his brother; Stroke in his brother and mother; Uterine cancer in his sister.   REVIEW OF SYSTEMS  : All  other systems reviewed and negative except where noted in the History  of Present Illness.   PHYSICAL EXAM: BP 122/74   Pulse 64   Ht 6\' 1"  (1.854 m)   Wt 237 lb (107.5 kg)   BMI 31.27 kg/m  General :  Alert, well developed male in no acute distress Head:  Normocephalic and atraumatic. Eyes :  sclerae anicteric,conjunctive pink  Heart:  regular rate and rhythm Pulm:  Clear anteriorly; no wheezing Abdomen:   Soft, Obese AB, skin exam normal, Normal bowel sounds.  no  tenderness, no  fluid wave, no  shifting dullness.  Extremities:   Without edema. Msk:  Symmetrical without gross deformities. Peripheral pulses intact.  Neurologic: Alert and  oriented x4;  grossly normal neurologically. without asterixis or clonus.  Skin:   without jaundice.  Psychiatric:  Demonstrates good judgement and reason without abnormal affect or behaviors.       Doree Albee, PA-C 12:44 PM

## 2023-11-10 NOTE — Patient Instructions (Signed)

## 2023-11-10 NOTE — Op Note (Signed)
[facility] Patient Name: Timothy Grant Procedure Date: 11/10/2023 11:06 AM MRN: [id number] Endoscopist: Wilhemina Bonito. Marina Goodell , MD, [id number] Age: 77 Referring MD:  Date of Birth: 07-07-1946 Gender: Male Account #: [id number] Procedure:                Upper GI endoscopy Indications:              Cirrhosis rule out esophageal varices Medicines:                Monitored Anesthesia Care Procedure:                Pre-Anesthesia Assessment:                           - Prior to the procedure, a History and Physical                            was performed, and patient medications and                            allergies were reviewed. The patient's tolerance of                            previous anesthesia was also reviewed. The risks                            and benefits of the procedure and the sedation                            options and risks were discussed with the patient.                            All questions were answered, and informed consent                            was obtained. Prior Anticoagulants: The patient has                            taken no anticoagulant or antiplatelet agents. ASA                            Grade Assessment: II - A patient with mild systemic                            disease. After reviewing the risks and benefits,                            the patient was deemed in satisfactory condition to                            undergo the procedure.                           After obtaining informed consent, the endoscope was  passed under direct vision. Throughout the                            procedure, the patient's blood pressure, pulse, and                            oxygen saturations were monitored continuously. The                            Olympus Scope [id number] was introduced through the                            mouth, and advanced to the second part of duodenum.                            The  upper GI endoscopy was accomplished without                            difficulty. The patient tolerated the procedure                            well. Scope In: Scope Out: Findings:                 The esophagus was normal.                           The stomach was normal.                           The examined duodenum was normal.                           The cardia and gastric fundus were normal on                            retroflexion. Complications:            No immediate complications. Estimated Blood Loss:     Estimated blood loss: none. Impression:               - Normal esophagus.                           - Normal stomach.                           - Normal examined duodenum.                           - No specimens collected. Recommendation:           - Patient has a contact number available for                            emergencies. The signs and symptoms of potential  delayed complications were discussed with the                            patient. Return to normal activities tomorrow.                            Written discharge instructions were provided to the                            patient.                           - Resume previous diet.                           - Continue present medications.                           -Return to the care of your primary provider Wilhemina Bonito. Marina Goodell, MD 11/10/2023 11:21:06 AM This report has been signed electronically.

## 2023-11-10 NOTE — Progress Notes (Signed)
Sedate, gd SR, tolerated procedure well, VSS, report to RN 

## 2023-11-11 ENCOUNTER — Telehealth: Payer: Self-pay

## 2023-11-11 NOTE — Telephone Encounter (Signed)
Follow up call to pt, lm for pt to call if having any difficulty with normal activities or eating and drinking.  Also to call if any other questions or concerns.  

## 2023-11-17 ENCOUNTER — Inpatient Hospital Stay: Payer: Medicare Other | Attending: Hematology & Oncology

## 2023-11-17 ENCOUNTER — Encounter: Payer: Self-pay | Admitting: Medical Oncology

## 2023-11-17 ENCOUNTER — Inpatient Hospital Stay: Payer: Medicare Other | Admitting: Medical Oncology

## 2023-11-17 VITALS — BP 125/72 | HR 52 | Temp 98.2°F | Resp 18 | Ht 73.0 in | Wt 234.0 lb

## 2023-11-17 DIAGNOSIS — K7469 Other cirrhosis of liver: Secondary | ICD-10-CM | POA: Insufficient documentation

## 2023-11-17 DIAGNOSIS — E538 Deficiency of other specified B group vitamins: Secondary | ICD-10-CM

## 2023-11-17 DIAGNOSIS — D696 Thrombocytopenia, unspecified: Secondary | ICD-10-CM

## 2023-11-17 DIAGNOSIS — D72819 Decreased white blood cell count, unspecified: Secondary | ICD-10-CM

## 2023-11-17 DIAGNOSIS — Z8619 Personal history of other infectious and parasitic diseases: Secondary | ICD-10-CM | POA: Diagnosis not present

## 2023-11-17 DIAGNOSIS — D709 Neutropenia, unspecified: Secondary | ICD-10-CM | POA: Diagnosis not present

## 2023-11-17 LAB — CMP (CANCER CENTER ONLY)
ALT: 34 U/L (ref 0–44)
AST: 30 U/L (ref 15–41)
Albumin: 4.2 g/dL (ref 3.5–5.0)
Alkaline Phosphatase: 76 U/L (ref 38–126)
Anion gap: 6 (ref 5–15)
BUN: 13 mg/dL (ref 8–23)
CO2: 27 mmol/L (ref 22–32)
Calcium: 10 mg/dL (ref 8.9–10.3)
Chloride: 104 mmol/L (ref 98–111)
Creatinine: 1.09 mg/dL (ref 0.61–1.24)
GFR, Estimated: >60 mL/min
Glucose, Bld: 97 mg/dL (ref 70–99)
Potassium: 5 mmol/L (ref 3.5–5.1)
Sodium: 137 mmol/L (ref 135–145)
Total Bilirubin: 0.7 mg/dL
Total Protein: 7.4 g/dL (ref 6.5–8.1)

## 2023-11-17 LAB — CBC WITH DIFFERENTIAL (CANCER CENTER ONLY)
Abs Immature Granulocytes: 0.01 10*3/uL (ref 0.00–0.07)
Basophils Absolute: 0 10*3/uL (ref 0.0–0.1)
Basophils Relative: 1 %
Eosinophils Absolute: 0.1 10*3/uL (ref 0.0–0.5)
Eosinophils Relative: 2 %
HCT: 38 % — ABNORMAL LOW (ref 39.0–52.0)
Hemoglobin: 13.1 g/dL (ref 13.0–17.0)
Immature Granulocytes: 0 %
Lymphocytes Relative: 32 %
Lymphs Abs: 1.1 10*3/uL (ref 0.7–4.0)
MCH: 33.7 pg (ref 26.0–34.0)
MCHC: 34.5 g/dL (ref 30.0–36.0)
MCV: 97.7 fL (ref 80.0–100.0)
Monocytes Absolute: 0.3 10*3/uL (ref 0.1–1.0)
Monocytes Relative: 10 %
Neutro Abs: 1.8 10*3/uL (ref 1.7–7.7)
Neutrophils Relative %: 55 %
Platelet Count: 118 10*3/uL — ABNORMAL LOW (ref 150–400)
RBC: 3.89 MIL/uL — ABNORMAL LOW (ref 4.22–5.81)
RDW: 13 % (ref 11.5–15.5)
WBC Count: 3.3 10*3/uL — ABNORMAL LOW (ref 4.0–10.5)
nRBC: 0 % (ref 0.0–0.2)

## 2023-11-17 LAB — VITAMIN B12: Vitamin B-12: 1243 pg/mL — ABNORMAL HIGH (ref 180–914)

## 2023-11-17 LAB — SEDIMENTATION RATE: Sed Rate: 22 mm/h — ABNORMAL HIGH (ref 0–16)

## 2023-11-17 NOTE — Progress Notes (Signed)
Hematology and Oncology Follow Up Visit  Timothy Grant [id number] 1946-07-05 77 y.o. 11/17/2023   Principle Diagnosis:   Chronic thrombocytopenia-likely cirrhosis/splenomegaly  Current Therapy:   Observation     Interim History:  [name] is back for follow-up.  He is seen yearly for observation however at his 07/14/2023 he had worsened pancytopenia and it suggested that he return to closer monitoring.   He has been following a healthy diet. He has been taking vitamin B12 supplement ( 1,000 I.U).   Liver US from 08/25/2023 showed cirrhosis. I referred him to GI. He states that he had an upper GI last week which looked good. They are not making any changes at this time. They see him next in May.   He feels normal. No bleeding, unintentional weight loss, night sweats, bruising episodes or frequent infections. He avoid ETOH and tylenol.   There is no change in bowel or bladder habits.  Overall, I would say his performance status is ECOG 0.    Wt Readings from Last 3 Encounters:  11/17/23 234 lb (106.1 kg)  11/10/23 237 lb (107.5 kg)  10/04/23 237 lb (107.5 kg)    Medications:  Current Outpatient Medications:    acetaminophen (TYLENOL) 500 MG tablet, Take 500 mg by mouth every 6 (six) hours as needed., Disp: , Rfl:    Cetirizine HCl 10 MG CAPS, Take 10 mg by mouth daily., Disp: , Rfl:    Cholecalciferol (VITAMIN D3) 125 MCG (5000 UT) CAPS, Take 5,000 Units by mouth daily., Disp: , Rfl:    cyanocobalamin (VITAMIN B12) 1000 MCG tablet, Take 1,000 mcg by mouth daily., Disp: , Rfl:    cyclobenzaprine (FLEXERIL) 10 MG tablet, TAKE ONE TABLET BY MOUTH THREE TIMES A DAY AS NEEDED FOR MUSCLE SPASMS, Disp: 90 tablet, Rfl: 1   folic acid (FOLVITE) 1 MG tablet, TAKE 1 TABLET BY MOUTH DAILY, Disp: 90 tablet, Rfl: 1   Glucosamine-Chondroit-Vit C-Mn (GLUCOSAMINE 1500 COMPLEX PO), Take 2,000 mg by mouth daily., Disp: , Rfl:    levothyroxine (SYNTHROID) 137 MCG tablet, Take 1 tablet by  mouth daily on Monday, Wednesday, Friday., Disp: 90 tablet, Rfl: 3   levothyroxine (SYNTHROID) 150 MCG tablet, TAKE 1 TABLET BY MOUTH ON TUESDAY, THURSDAY, SATURDAY,AND SUNDAY, Disp: 16 tablet, Rfl: 3   Multiple Vitamin (MULTIVITAMIN) capsule, Take 1 capsule by mouth daily. , Disp: , Rfl:    Omega-3 Fatty Acids (FISH OIL) 1200 MG CAPS, Take 1,200 mg by mouth daily., Disp: , Rfl:    rosuvastatin (CRESTOR) 10 MG tablet, TAKE 1 TABLET BY MOUTH DAILY, Disp: 90 tablet, Rfl: 3   sildenafil (REVATIO) 20 MG tablet, Use 5 tablets PO as needed, Disp: 90 tablet, Rfl: 3  Allergies: No Known Allergies  Past Medical History, Surgical history, Social history, and Family History were reviewed and updated.  Review of Systems: Review of Systems  Constitutional: Negative.   HENT:  Negative.    Eyes: Negative.   Respiratory: Negative.    Cardiovascular: Negative.   Gastrointestinal: Negative.   Endocrine: Negative.   Genitourinary: Negative.    Musculoskeletal: Negative.   Skin: Negative.   Neurological: Negative.   Hematological: Negative.   Psychiatric/Behavioral: Negative.      Physical Exam:  height is 6\' 1"  (1.854 m) and weight is 234 lb (106.1 kg). His oral temperature is 98.2 F (36.8 C). His blood pressure is 125/72 and his pulse is 52 (abnormal). His respiration is 18 and oxygen saturation is 100%.   Wt Readings from  Last 3 Encounters:  11/17/23 234 lb (106.1 kg)  11/10/23 237 lb (107.5 kg)  10/04/23 237 lb (107.5 kg)    Physical Exam Vitals reviewed.  HENT:     Head: Normocephalic and atraumatic.  Eyes:     Pupils: Pupils are equal, round, and reactive to light.  Cardiovascular:     Rate and Rhythm: Normal rate and regular rhythm.     Heart sounds: Normal heart sounds.  Pulmonary:     Effort: Pulmonary effort is normal.     Breath sounds: Normal breath sounds.  Abdominal:     General: Bowel sounds are normal.     Palpations: Abdomen is soft.  Musculoskeletal:         General: No tenderness or deformity. Normal range of motion.     Cervical back: Normal range of motion.  Lymphadenopathy:     Cervical: No cervical adenopathy.  Skin:    General: Skin is warm and dry.     Findings: No erythema or rash.  Neurological:     Mental Status: He is alert and oriented to person, place, and time.  Psychiatric:        Behavior: Behavior normal.        Thought Content: Thought content normal.        Judgment: Judgment normal.      Lab Results  Component Value Date   WBC 3.3 (L) 11/17/2023   HGB 13.1 11/17/2023   HCT 38.0 (L) 11/17/2023   MCV 97.7 11/17/2023   PLT 118 (L) 11/17/2023     Chemistry      Component Value Date/Time   NA 137 11/17/2023 1100   K 5.0 11/17/2023 1100   CL 104 11/17/2023 1100   CO2 27 11/17/2023 1100   BUN 13 11/17/2023 1100   CREATININE 1.09 11/17/2023 1100   CREATININE 1.02 11/17/2020 0848      Component Value Date/Time   CALCIUM 10.0 11/17/2023 1100   ALKPHOS 76 11/17/2023 1100   AST 30 11/17/2023 1100   ALT 34 11/17/2023 1100   BILITOT 0.7 11/17/2023 1100     Encounter Diagnoses  Name Primary?   Thrombocytopenia (HCC) Yes   Leukopenia, unspecified type    Vitamin B12 deficiency    History of hepatitis A    Other cirrhosis of liver (HCC)     Impression and Plan: [name] is a very nice 77 year old white male. He has thrombocytopenia as well as neutropenia.   Review of labs from today show that his platelets are 118 down from 133. WBC is 3.3 today which is stable. ANC is 1.8. B12 is pending. Iron and Ferritin pending.   He has deferred a rheumatology referral in the past. At this time we are holding off on a referral unless counts start being unstable or drop below 100.   Continue follow up with GI  Disposition Today Platelets are 118 down from 133. WBC is 3.3 today which is stable. ANC is 1.8.  RTC 3 month labs (CBC w, CMP, B12, iron, sed rate)  RTC 3 month + 3-7 days for APP only -[place]    Rushie Chestnut, PA-C 12/12/202411:51 AM

## 2023-12-03 ENCOUNTER — Other Ambulatory Visit: Payer: Self-pay | Admitting: Family Medicine

## 2024-01-17 ENCOUNTER — Telehealth: Payer: Self-pay

## 2024-01-17 DIAGNOSIS — K7460 Unspecified cirrhosis of liver: Secondary | ICD-10-CM

## 2024-01-17 NOTE — Telephone Encounter (Signed)
-----   Message from [facility] Dywane Peruski J sent at 10/04/2023  3:18 PM EDT ----- Set him up for a RUQ U/S for hepatocellar carcinoma screening in March 2025. May sure he has a f/u appointment as well for April.

## 2024-01-17 NOTE — Telephone Encounter (Signed)
I spoke with Timothy Grant and informed him of his appointments: U/S at [facility] on 02/13/2024 at 9:00AM, arrive 8:45AM, NPO 6-8 hours. Then we set up an appointment to come see Dr Marina Goodell for 03/30/2024 at 2:40pm. Timothy Grant verbalized understanding dates/times.

## 2024-01-27 ENCOUNTER — Telehealth: Payer: Self-pay

## 2024-01-27 NOTE — Telephone Encounter (Signed)
Copied from CRM [id number]. Topic: Clinical - Request for Lab/Test Order >> Jan 27, 2024 12:06 PM Chantha C wrote: Reason for CRM: Patient has physical appointment for 02/09/24 and would like to come in a week prior to have labs done, please can Dr. Mardelle Matte order labs. Please call back at [phone],  Please advise.  Message has been sent to provider to address

## 2024-01-30 ENCOUNTER — Other Ambulatory Visit: Payer: Self-pay

## 2024-01-30 DIAGNOSIS — Z0000 Encounter for general adult medical examination without abnormal findings: Secondary | ICD-10-CM

## 2024-01-30 NOTE — Progress Notes (Signed)
 Marland Kitchen

## 2024-02-02 ENCOUNTER — Other Ambulatory Visit (INDEPENDENT_AMBULATORY_CARE_PROVIDER_SITE_OTHER): Payer: Medicare Other

## 2024-02-02 DIAGNOSIS — Z0000 Encounter for general adult medical examination without abnormal findings: Secondary | ICD-10-CM | POA: Diagnosis not present

## 2024-02-02 LAB — CBC WITH DIFFERENTIAL/PLATELET
Basophils Absolute: 0 10*3/uL (ref 0.0–0.1)
Basophils Relative: 1 % (ref 0.0–3.0)
Eosinophils Absolute: 0.1 10*3/uL (ref 0.0–0.7)
Eosinophils Relative: 3 % (ref 0.0–5.0)
HCT: 39.5 % (ref 39.0–52.0)
Hemoglobin: 13.7 g/dL (ref 13.0–17.0)
Lymphocytes Relative: 24.3 % (ref 12.0–46.0)
Lymphs Abs: 1 10*3/uL (ref 0.7–4.0)
MCHC: 34.7 g/dL (ref 30.0–36.0)
MCV: 97.2 fL (ref 78.0–100.0)
Monocytes Absolute: 0.3 10*3/uL (ref 0.1–1.0)
Monocytes Relative: 8.3 % (ref 3.0–12.0)
Neutro Abs: 2.6 10*3/uL (ref 1.4–7.7)
Neutrophils Relative %: 63.4 % (ref 43.0–77.0)
Platelets: 163 10*3/uL (ref 150.0–400.0)
RBC: 4.06 Mil/uL — ABNORMAL LOW (ref 4.22–5.81)
RDW: 13.2 % (ref 11.5–15.5)
WBC: 4 10*3/uL (ref 4.0–10.5)

## 2024-02-02 LAB — COMPREHENSIVE METABOLIC PANEL
ALT: 37 U/L (ref 0–53)
AST: 32 U/L (ref 0–37)
Albumin: 4.2 g/dL (ref 3.5–5.2)
Alkaline Phosphatase: 95 U/L (ref 39–117)
BUN: 14 mg/dL (ref 6–23)
CO2: 24 meq/L (ref 19–32)
Calcium: 10.1 mg/dL (ref 8.4–10.5)
Chloride: 103 meq/L (ref 96–112)
Creatinine, Ser: 1.11 mg/dL (ref 0.40–1.50)
GFR: 63.6 mL/min (ref >60.00)
Glucose, Bld: 117 mg/dL — ABNORMAL HIGH (ref 70–99)
Potassium: 3.9 meq/L (ref 3.5–5.1)
Sodium: 136 meq/L (ref 135–145)
Total Bilirubin: 0.6 mg/dL (ref 0.2–1.2)
Total Protein: 7.4 g/dL (ref 6.0–8.3)

## 2024-02-02 LAB — LIPID PANEL
Cholesterol: 144 mg/dL (ref 0–200)
HDL: 47 mg/dL (ref >39.00)
LDL Cholesterol: 82 mg/dL (ref 0–99)
NonHDL: 96.98
Total CHOL/HDL Ratio: 3
Triglycerides: 77 mg/dL (ref 0.0–149.0)
VLDL: 15.4 mg/dL (ref 0.0–40.0)

## 2024-02-02 LAB — TSH: TSH: 3.71 u[IU]/mL (ref 0.35–5.50)

## 2024-02-09 ENCOUNTER — Encounter: Payer: Self-pay | Admitting: Family Medicine

## 2024-02-09 ENCOUNTER — Ambulatory Visit: Payer: Medicare Other | Admitting: Family Medicine

## 2024-02-09 VITALS — BP 136/72 | HR 60 | Temp 97.7°F | Ht 73.0 in | Wt 233.6 lb

## 2024-02-09 DIAGNOSIS — D72819 Decreased white blood cell count, unspecified: Secondary | ICD-10-CM

## 2024-02-09 DIAGNOSIS — N529 Male erectile dysfunction, unspecified: Secondary | ICD-10-CM | POA: Diagnosis not present

## 2024-02-09 DIAGNOSIS — E222 Syndrome of inappropriate secretion of antidiuretic hormone: Secondary | ICD-10-CM

## 2024-02-09 DIAGNOSIS — Z0000 Encounter for general adult medical examination without abnormal findings: Secondary | ICD-10-CM | POA: Diagnosis not present

## 2024-02-09 DIAGNOSIS — D696 Thrombocytopenia, unspecified: Secondary | ICD-10-CM

## 2024-02-09 DIAGNOSIS — D126 Benign neoplasm of colon, unspecified: Secondary | ICD-10-CM

## 2024-02-09 DIAGNOSIS — K7469 Other cirrhosis of liver: Secondary | ICD-10-CM

## 2024-02-09 DIAGNOSIS — Z0001 Encounter for general adult medical examination with abnormal findings: Secondary | ICD-10-CM

## 2024-02-09 DIAGNOSIS — E782 Mixed hyperlipidemia: Secondary | ICD-10-CM | POA: Diagnosis not present

## 2024-02-09 DIAGNOSIS — E039 Hypothyroidism, unspecified: Secondary | ICD-10-CM

## 2024-02-09 DIAGNOSIS — K7460 Unspecified cirrhosis of liver: Secondary | ICD-10-CM | POA: Insufficient documentation

## 2024-02-09 DIAGNOSIS — Z8619 Personal history of other infectious and parasitic diseases: Secondary | ICD-10-CM

## 2024-02-09 MED ORDER — TADALAFIL 5 MG PO TABS: 5.0000 mg | ORAL_TABLET | Freq: Every day | ORAL | 3 refills | Status: DC

## 2024-02-09 MED ORDER — CYCLOBENZAPRINE HCL 10 MG PO TABS
10.0000 mg | ORAL_TABLET | Freq: Three times a day (TID) | ORAL | 3 refills | Status: AC | PRN
Start: 2024-02-09 — End: ?

## 2024-02-09 NOTE — Patient Instructions (Addendum)
 Please return in 12 months for your annual complete physical; please come fasting.   If you have any questions or concerns, please don't hesitate to send me a message via MyChart or call the office at [phone]. Thank you for visiting with Timothy Grant today! It's our pleasure caring for you.   VISIT SUMMARY:  [name], a 78 year old male, came in for a follow-up and medication management. His liver condition, including a history of hepatitis A, has stabilized with normal liver function tests. He is managing his cholesterol well with his current statin therapy. He reported that Viagra is no longer effective for his erectile dysfunction and requested a prescription for daily Cialis. Additionally, he requested a refill of Flexeril for muscle spasms.  YOUR PLAN:  -CHRONIC LIVER DISEASE: Chronic liver disease refers to a long-term condition affecting the liver, often due to hepatitis or other liver damage. Your liver function tests are normal, and you should continue with your current management plan. Please complete the scheduled ultrasound for further evaluation.  -HYPERLIPIDEMIA: Hyperlipidemia means having high levels of fats (lipids) in your blood, such as cholesterol. Your LDL cholesterol is well-controlled at 82 with your current statin therapy. Continue with your current medication as it is effectively managing your cholesterol levels.  -ERECTILE DYSFUNCTION: Erectile dysfunction is the inability to get or keep an erection firm enough for sex. Since Viagra is no longer effective for you, we will prescribe daily 5mg  Cialis, which you have found effective in the past.  -MUSCLE SPASMS: Muscle spasms are sudden, involuntary muscle contractions. We will refill your prescription for Flexeril, which you use as needed for relief.  INSTRUCTIONS:  Please complete your scheduled liver ultrasound on Monday. Continue taking your current statin therapy for cholesterol management. Start taking the  prescribed daily 5mg  Cialis for erectile dysfunction. Use Flexeril as needed for muscle spasms. Follow up with Timothy Grant if you have any new symptoms or concerns.  For more information, you can read your full clinical note, available in your patient portal.

## 2024-02-09 NOTE — Progress Notes (Signed)
 Subjective  Chief Complaint  Patient presents with   Annual Exam    Pt here for Annual Exam and is not currently fasting    Hypothyroidism    HPI: Timothy Grant is a 78 y.o. male who presents to [facility] [place] today for a Male Wellness Visit. He also has the concerns and/or needs as listed above in the chief complaint. These will be addressed in addition to the Health Maintenance Visit.   Wellness Visit: annual visit with health maintenance review and exam   HM: screens are current. Overall doing well however son expectedly passed 6 months from a gi hemorrhage. Pt is grieving appropriately. Imms are all current  Body mass index is 30.82 kg/m. Wt Readings from Last 3 Encounters:  02/09/24 233 lb 9.6 oz (106 kg)  11/17/23 234 lb (106.1 kg)  11/10/23 237 lb (107.5 kg)     Chronic disease management visit and/or acute problem visit: Discussed the use of AI scribe software for clinical note transcription with the patient, who gave verbal consent to proceed.  History of Present Illness   Timothy Grant "Timothy Grant" is a 78 year old male who presents for follow-up and medication management.  Cirrhosis secondary due hepatitis C: reviewed recent Gi eval: recent EGD neg for varices. Lfts normalized. Feels well. No complications.   HLD: He is currently on a statin for cholesterol management, with an LDL level of 82, which is considered well-controlled. He has previously experienced liver-related issues with higher doses of statins. Tolerating well. No other cardiovascular disease risk factors.   ED: requesting daily cialis. Viagra is no longer effective for him, even at a dose of 100 mg. He has used Cialis in the past, about ten years ago, and found it more effective.  He requests a refill of Flexeril, which he uses as needed for muscle spasms, although he still has some remaining.    Hypothyroidism: well controlled by recent TSH. Clinically stable.   Reviewed  heme notes and cbc; has chronic thrombocytopenia, likely due to splenomegaly and cirrhosis. Leukopenia has improved. No sxs.   Sodium is now normal; has h/o SIADH.  Assessment  1. Encounter for well adult exam with abnormal findings   2. Acquired hypothyroidism   3. Adenomatous polyp of colon, unspecified part of colon   4. Mixed hyperlipidemia   5. History of hepatitis A   6. SIADH (syndrome of inappropriate ADH production) (HCC)   7. Leukopenia, unspecified type   8. Thrombocytopenia (HCC)   9. Other cirrhosis of liver (HCC)   10. Male erectile disorder      Plan  Male Wellness Visit: Age appropriate Health Maintenance and Prevention measures were discussed with patient. Included topics are cancer screening recommendations, ways to keep healthy (see AVS) including dietary and exercise recommendations, regular eye and dental care, use of seat belts, and avoidance of moderate alcohol use and tobacco use.  BMI: discussed patient's BMI and encouraged positive lifestyle modifications to help get to or maintain a target BMI. HM needs and immunizations were addressed and ordered. See below for orders. See HM and immunization section for updates. Routine labs and screening tests ordered including cmp, cbc and lipids where appropriate. Discussed recommendations regarding Vit D and calcium supplementation (see AVS)  Chronic disease f/u and/or acute problem visit: (deemed necessary to be done in addition to the wellness visit): Assessment and Plan    cirrhosis Stable with no complications. Hepatitis A positive, all other workup negative. Liver function  tests normalized. Upcoming ultrasound for further evaluation. -Continue current management and monitoring. -Complete scheduled ultrasound.  Hyperlipidemia LDL at 82 on current statin therapy. Discussed the potential for a broader cholesterol panel, but decided against it due to good control with current medication and potential liver impact of  higher statin doses. -Continue current statin therapy.  Erectile Dysfunction Previous use of Viagra, now ineffective. Prior positive experience with higher dose Cialis. Patient requests daily Cialis for spontaneity. -Prescribe daily 5mg  Cialis.  Muscle Spasms Request for Flexeril refill. -Prescribe Flexeril refill.     Continue levothyroxine for well controlled hypothyroidism.  Condolence given for loss of son.    Follow up: 12 mo for cpe [facility] side effects, risks, benefits, and alternatives for medications and treatment plan prescribed today were discussed, and the patient expressed understanding of the given instructions. Patient is instructed to call or message via MyChart if he/she has any questions or concerns regarding our treatment plan. No barriers to understanding were identified. We discussed Red Flag symptoms and signs in detail. Patient expressed understanding regarding what to do in case of urgent or emergency type symptoms.  Medication list was reconciled, printed and provided to the patient in AVS. Patient instructions and summary information was reviewed with the patient as documented in the AVS. This note was prepared with assistance of Dragon voice recognition software. Occasional wrong-word or sound-a-like substitutions may have occurred due to the inherent limitations of voice recognition software  No orders of the defined types were placed in this encounter.  Meds ordered this encounter  Medications   tadalafil (CIALIS) 5 MG tablet    Sig: Take 1 tablet (5 mg total) by mouth daily.    Dispense:  90 tablet    Refill:  3   cyclobenzaprine (FLEXERIL) 10 MG tablet    Sig: Take 1 tablet (10 mg total) by mouth 3 (three) times daily as needed for muscle spasms.    Dispense:  90 tablet    Refill:  3     Patient Active Problem List   Diagnosis Date Noted   Hepatic cirrhosis (HCC) 02/09/2024   History of hepatitis A 05/15/2020   SIADH (syndrome of inappropriate ADH  production) (HCC) 05/15/2020   Acquired hypothyroidism 02/05/2011   Adenomatous colon polyp 02/05/2011   Mixed hyperlipidemia 02/05/2011   Osteoarthritis of finger of left hand 01/07/2017   Leukopenia 08/09/2012   Thrombocytopenia (HCC) 07/09/2019   Male erectile disorder 02/09/2012   Health Maintenance  Topic Date Due   DTaP/Tdap/Td (2 - Td or Tdap) 02/04/2021   COVID-19 Vaccine (6 - 2024-25 season) 02/25/2024 (Originally 08/07/2023)   Medicare Annual Wellness (AWV)  05/15/2024   Pneumonia Vaccine 23+ Years old  Completed   INFLUENZA VACCINE  Completed   Hepatitis C Screening  Completed   Zoster Vaccines- Shingrix  Completed   HPV VACCINES  Aged Out   Colonoscopy  Discontinued   Immunization History  Administered Date(s) Administered   Fluad Quad(high Dose 65+) 08/14/2019, 09/04/2021   Fluad Trivalent(High Dose 65+) 09/20/2023   Hepatitis B, ADULT 08/06/2009   Hepatitis B, PED/ADOLESCENT 08/06/2009   Influenza Split 08/07/2011, 08/28/2013   Influenza, High Dose Seasonal PF 09/26/2017   Influenza, Quadrivalent, Recombinant, Inj, Pf 09/06/2016   Influenza, Seasonal, Injecte, Preservative Fre 09/04/2014, 09/09/2015   Influenza,inj,Quad PF,6+ Mos 09/04/2014, 09/09/2015   Influenza,trivalent, recombinat, inj, PF 08/07/2011, 08/09/2012, 08/28/2013   Influenza-Unspecified 08/09/2012, 09/22/2018, 09/19/2020, 09/02/2022   Moderna Covid-19 Vaccine Bivalent Booster 55yrs & up 12/10/2021   Moderna Sars-Covid-2  Vaccination 03/06/2020, 04/03/2020, 09/30/2020, 05/14/2021   Pneumococcal Conjugate-13 11/04/2014   Pneumococcal Polysaccharide-23 02/05/2011   Rotavirus,unspecified  11/11/2023   Tdap 02/05/2011   Zoster Recombinant(Shingrix) 07/15/2018, 09/11/2018   Zoster, Live 02/05/2011   We updated and reviewed the patient's past history in detail and it is documented below. Allergies: Patient has no known allergies. Past Medical History  has a past medical history of Adenomatous polyps,  Allergy, Arthritis, Cardiomegaly, Cataract, Cirrhosis (HCC), ED (erectile dysfunction), Gallbladder polyp, Hepatitis A, Hyperlipidemia, Hypothyroidism, Non-traumatic rhabdomyolysis (02/21/2019), Thrombocytopenia (HCC), and Varicocele (02/01/2011). Past Surgical History Patient  has a past surgical history that includes Tonsillectomy; Eye surgery (Right, 1956); Eye surgery (Left, 1963); Liver biopsy; Colonoscopy (2014); Polypectomy; and Cholecystectomy (N/A, 01/30/2021). Social History Patient  reports that he has quit smoking. His smoking use included cigarettes and cigars. He has never used smokeless tobacco. He reports that he does not currently use alcohol after a past usage of about 6.0 standard drinks of alcohol per week. He reports that he does not use drugs. Family History family history includes Alzheimer's disease in his mother; Arthritis in his mother; Breast cancer in his brother; Colon polyps in his brother; Dementia in his mother; Heart disease in his father and paternal grandmother; Hyperlipidemia in his brother; Lung cancer in his brother; Stroke in his brother and mother; Uterine cancer in his sister. Review of Systems: Constitutional: negative for fever or malaise Ophthalmic: negative for photophobia, double vision or loss of vision Cardiovascular: negative for chest pain, dyspnea on exertion, or new LE swelling Respiratory: negative for SOB or persistent cough Gastrointestinal: negative for abdominal pain, change in bowel habits or melena Genitourinary: negative for dysuria or gross hematuria Musculoskeletal: negative for new gait disturbance or muscular weakness Integumentary: negative for new or persistent rashes Neurological: negative for TIA or stroke symptoms Psychiatric: negative for SI or delusions Allergic/Immunologic: negative for hives  Patient Care Team    Relationship Specialty Notifications Start End  Willow Ora, MD PCP - General Family Medicine  11/07/17    Rodrigo Ran, OD  Ophthalmology  03/22/18   Cindee Salt, MD Consulting Physician Orthopedic Surgery  03/22/18   R[place]  Dentistry  03/22/18   Tressia Danas, MD (Inactive) Consulting Physician Gastroenterology  03/27/19   Arthur Holms, MD (Inactive) Consulting Physician Hematology  08/22/19   Dahlia Byes, [facility] Pharmacist Pharmacist  03/19/21    Comment: Phone [phone]   Objective  Vitals: BP 136/72   Pulse 60   Temp 97.7 F (36.5 C)   Ht 6\' 1"  (1.854 m)   Wt 233 lb 9.6 oz (106 kg)   SpO2 98%   BMI 30.82 kg/m  General:  Well developed, well nourished, no acute distress  Psych:  Alert and orientedx3,normal mood and affect HEENT:  Normocephalic, atraumatic, non-icteric sclera,  oropharynx is clear without mass or exudate, supple neck without adenopathy, or thyromegaly Cardiovascular:  Normal S1, S2, RRR without gallop, rub or murmur,  Respiratory:  Good breath sounds bilaterally, CTAB with normal respiratory effort Gastrointestinal: normal bowel sounds, soft, non-tender, no noted masses. No HSM MSK: Joints are without erythema or swelling.  Skin:  Warm, no rashes Neurologic:    Mental status is normal.  Gross motor and sensory exams are normal. Stable gait. No tremor   No visits with results within 1 Day(s) from this visit.  Latest known visit with results is:  Lab on 02/02/2024  Component Date Value Ref Range Status   WBC 02/02/2024 4.0  4.0 - 10.5 K/uL  Final   RBC 02/02/2024 4.06 (L)  4.22 - 5.81 Mil/uL Final   Hemoglobin 02/02/2024 13.7  13.0 - 17.0 g/dL Final   HCT 95/63/8756 39.5  39.0 - 52.0 % Final   MCV 02/02/2024 97.2  78.0 - 100.0 fl Final   MCHC 02/02/2024 34.7  30.0 - 36.0 g/dL Final   RDW 43/32/9518 13.2  11.5 - 15.5 % Final   Platelets 02/02/2024 163.0  150.0 - 400.0 K/uL Final   Neutrophils Relative % 02/02/2024 63.4  43.0 - 77.0 % Final   Lymphocytes Relative 02/02/2024 24.3  12.0 - 46.0 % Final   Monocytes Relative 02/02/2024 8.3  3.0 - 12.0 % Final    Eosinophils Relative 02/02/2024 3.0  0.0 - 5.0 % Final   Basophils Relative 02/02/2024 1.0  0.0 - 3.0 % Final   Neutro Abs 02/02/2024 2.6  1.4 - 7.7 K/uL Final   Lymphs Abs 02/02/2024 1.0  0.7 - 4.0 K/uL Final   Monocytes Absolute 02/02/2024 0.3  0.1 - 1.0 K/uL Final   Eosinophils Absolute 02/02/2024 0.1  0.0 - 0.7 K/uL Final   Basophils Absolute 02/02/2024 0.0  0.0 - 0.1 K/uL Final   Sodium 02/02/2024 136  135 - 145 mEq/L Final   Potassium 02/02/2024 3.9  3.5 - 5.1 mEq/L Final   Chloride 02/02/2024 103  96 - 112 mEq/L Final   CO2 02/02/2024 24  19 - 32 mEq/L Final   Glucose, Bld 02/02/2024 117 (H)  70 - 99 mg/dL Final   BUN 84/16/6063 14  6 - 23 mg/dL Final   Creatinine, Ser 02/02/2024 1.11  0.40 - 1.50 mg/dL Final   Total Bilirubin 02/02/2024 0.6  0.2 - 1.2 mg/dL Final   Alkaline Phosphatase 02/02/2024 95  39 - 117 U/L Final   AST 02/02/2024 32  0 - 37 U/L Final   ALT 02/02/2024 37  0 - 53 U/L Final   Total Protein 02/02/2024 7.4  6.0 - 8.3 g/dL Final   Albumin 01/60/1093 4.2  3.5 - 5.2 g/dL Final   GFR 23/55/7322 63.60  >60.00 mL/min Final   Calcium 02/02/2024 10.1  8.4 - 10.5 mg/dL Final   Cholesterol 02/54/2706 144  0 - 200 mg/dL Final   Triglycerides 23/76/2831 77.0  0.0 - 149.0 mg/dL Final   HDL 51/76/1607 47.00  >39.00 mg/dL Final   VLDL 37/09/6268 15.4  0.0 - 40.0 mg/dL Final   LDL Cholesterol 02/02/2024 82  0 - 99 mg/dL Final   Total CHOL/HDL Ratio 02/02/2024 3   Final   NonHDL 02/02/2024 96.98   Final   TSH 02/02/2024 3.71  0.35 - 5.50 uIU/mL Final

## 2024-02-13 ENCOUNTER — Ambulatory Visit (HOSPITAL_COMMUNITY)
Admission: RE | Admit: 2024-02-13 | Discharge: 2024-02-13 | Disposition: A | Discharge status: Home / Self Care | Payer: Medicare Other | Source: Ambulatory Visit | Attending: Internal Medicine | Admitting: Internal Medicine

## 2024-02-13 DIAGNOSIS — K7460 Unspecified cirrhosis of liver: Secondary | ICD-10-CM | POA: Insufficient documentation

## 2024-02-13 DIAGNOSIS — Z9049 Acquired absence of other specified parts of digestive tract: Secondary | ICD-10-CM | POA: Diagnosis not present

## 2024-02-24 ENCOUNTER — Encounter: Payer: Self-pay | Admitting: Internal Medicine

## 2024-03-02 ENCOUNTER — Other Ambulatory Visit: Payer: Self-pay | Admitting: Family Medicine

## 2024-03-02 DIAGNOSIS — E039 Hypothyroidism, unspecified: Secondary | ICD-10-CM

## 2024-03-02 NOTE — Telephone Encounter (Signed)
 Copied from CRM [id number]. Topic: Clinical - Medication Refill >> Mar 02, 2024 12:45 PM Sonny Dandy B wrote: Most Recent Primary Care Visit:  Provider: Willow Ora  Department: [facility]  Visit Type: PHYSICAL  Date: 02/09/2024  Medication: levothyroxine (SYNTHROID) 150 MCG tablet  90 day supply , additional refills   Has the patient contacted their pharmacy? Yes (Agent: If no, request that the patient contact the pharmacy for the refill. If patient does not wish to contact the pharmacy document the reason why and proceed with request.) (Agent: If yes, when and what did the pharmacy advise?)  Is this the correct pharmacy for this prescription? Yes If no, delete pharmacy and type the correct one.  This is the patient's preferred pharmacy:  [facility] [id number] - [place], [place] - [address] [place] [address] Phone: [phone] Fax: [phone]   Has the prescription been filled recently? Yes  Is the patient out of the medication? Yes  Has the patient been seen for an appointment in the last year OR does the patient have an upcoming appointment? Yes  Can we respond through MyChart? Yes  Agent: Please be advised that Rx refills may take up to 3 business days. We ask that you follow-up with your pharmacy.

## 2024-03-06 ENCOUNTER — Telehealth: Payer: Self-pay | Admitting: Family Medicine

## 2024-03-06 ENCOUNTER — Other Ambulatory Visit: Payer: Self-pay

## 2024-03-06 MED ORDER — LEVOTHYROXINE SODIUM 150 MCG PO TABS: ORAL_TABLET | ORAL | 3 refills | Status: DC

## 2024-03-06 MED ORDER — LEVOTHYROXINE SODIUM 137 MCG PO TABS: ORAL_TABLET | ORAL | 3 refills | Status: AC

## 2024-03-06 NOTE — Addendum Note (Signed)
 Addended by: Shelva Majestic on: 03/06/2024 04:35 PM   Modules accepted: Orders

## 2024-03-06 NOTE — Telephone Encounter (Signed)
 Copied from CRM [id number]. Topic: Clinical - Prescription Issue >> Mar 06, 2024  9:30 AM Tiffany S wrote: Reason for CRM: levothyroxine (SYNTHROID) 150 MCG tablet [[id number]] Patient called on status of medication refill sent over on 3/28 patient stated pharmacy does not have the prescription please follow up patient is now out of the medication

## 2024-03-07 NOTE — Telephone Encounter (Signed)
 Per chart was sent again by Dr Durene Cal on 03/06/24 nothing further needed at this time

## 2024-03-16 ENCOUNTER — Encounter: Payer: Self-pay | Admitting: Family

## 2024-03-16 ENCOUNTER — Inpatient Hospital Stay: Payer: Medicare Other | Admitting: Family

## 2024-03-16 ENCOUNTER — Inpatient Hospital Stay: Payer: Medicare Other | Attending: Hematology & Oncology

## 2024-03-16 VITALS — BP 128/64 | HR 74 | Temp 98.3°F | Resp 18 | Wt 233.0 lb

## 2024-03-16 DIAGNOSIS — D72819 Decreased white blood cell count, unspecified: Secondary | ICD-10-CM

## 2024-03-16 DIAGNOSIS — D709 Neutropenia, unspecified: Secondary | ICD-10-CM | POA: Insufficient documentation

## 2024-03-16 DIAGNOSIS — E538 Deficiency of other specified B group vitamins: Secondary | ICD-10-CM

## 2024-03-16 DIAGNOSIS — D696 Thrombocytopenia, unspecified: Secondary | ICD-10-CM | POA: Diagnosis not present

## 2024-03-16 LAB — CMP (CANCER CENTER ONLY)
ALT: 27 U/L (ref 0–44)
AST: 29 U/L (ref 15–41)
Albumin: 4.5 g/dL (ref 3.5–5.0)
Alkaline Phosphatase: 86 U/L (ref 38–126)
Anion gap: 8 (ref 5–15)
BUN: 16 mg/dL (ref 8–23)
CO2: 27 mmol/L (ref 22–32)
Calcium: 10 mg/dL (ref 8.9–10.3)
Chloride: 102 mmol/L (ref 98–111)
Creatinine: 1.11 mg/dL (ref 0.61–1.24)
GFR, Estimated: >60 mL/min (ref >60)
Glucose, Bld: 115 mg/dL — ABNORMAL HIGH (ref 70–99)
Potassium: 4.7 mmol/L (ref 3.5–5.1)
Sodium: 137 mmol/L (ref 135–145)
Total Bilirubin: 0.6 mg/dL (ref 0.0–1.2)
Total Protein: 7.1 g/dL (ref 6.5–8.1)

## 2024-03-16 LAB — CBC WITH DIFFERENTIAL (CANCER CENTER ONLY)
Abs Immature Granulocytes: 0.01 10*3/uL (ref 0.00–0.07)
Basophils Absolute: 0 10*3/uL (ref 0.0–0.1)
Basophils Relative: 1 %
Eosinophils Absolute: 0.1 10*3/uL (ref 0.0–0.5)
Eosinophils Relative: 2 %
HCT: 37.9 % — ABNORMAL LOW (ref 39.0–52.0)
Hemoglobin: 13.1 g/dL (ref 13.0–17.0)
Immature Granulocytes: 0 %
Lymphocytes Relative: 23 %
Lymphs Abs: 0.9 10*3/uL (ref 0.7–4.0)
MCH: 33.4 pg (ref 26.0–34.0)
MCHC: 34.6 g/dL (ref 30.0–36.0)
MCV: 96.7 fL (ref 80.0–100.0)
Monocytes Absolute: 0.4 10*3/uL (ref 0.1–1.0)
Monocytes Relative: 10 %
Neutro Abs: 2.4 10*3/uL (ref 1.7–7.7)
Neutrophils Relative %: 64 %
Platelet Count: 130 10*3/uL — ABNORMAL LOW (ref 150–400)
RBC: 3.92 MIL/uL — ABNORMAL LOW (ref 4.22–5.81)
RDW: 13.1 % (ref 11.5–15.5)
WBC Count: 3.8 10*3/uL — ABNORMAL LOW (ref 4.0–10.5)
nRBC: 0 % (ref 0.0–0.2)

## 2024-03-16 LAB — IRON AND IRON BINDING CAPACITY (CC-WL,HP ONLY)
Iron: 96 ug/dL (ref 45–182)
Saturation Ratios: 26 % (ref 17.9–39.5)
TIBC: 374 ug/dL (ref 250–450)
UIBC: 278 ug/dL (ref 117–376)

## 2024-03-16 LAB — VITAMIN B12: Vitamin B-12: 1020 pg/mL — ABNORMAL HIGH (ref 180–914)

## 2024-03-16 LAB — FERRITIN: Ferritin: 52 ng/mL (ref 24–336)

## 2024-03-16 LAB — SEDIMENTATION RATE: Sed Rate: 36 mm/h — ABNORMAL HIGH (ref 0–16)

## 2024-03-16 NOTE — Progress Notes (Signed)
 Hematology and Oncology Follow Up Visit  Timothy Grant [id number] 02-27-1946 78 y.o. 03/16/2024   Principle Diagnosis:  Chronic thrombocytopenia-likely cirrhosis/splenomegaly   Current Therapy:        Observation   Interim History:  Timothy Grant is here today for follow-up. He is doing well and has no complaints at his time.  Hgb is stable at 13.1, MCV 96, WBC count 3.8 and platelets 130.  He tripped and fell from his neighbors porch last week and has an abrasion to the knee and right upper arm. These are healing nicely. Thankfully he states that he did not break any bones or hit his head. Bi syncope.  No abnormal bruising, no petechiae.  No blood loss noted.  No issue with frequent or recurrent infections. No fever, chills, n/v, cough, rash, dizziness, SOB, chest pain, palpitations, abdominal pain or changes in bowel or bladder habits.  No numbness or tingling in his extremities.  Appetite and hydration are good. Weight is stable at 233 lbs.   ECOG Performance Status: 1 - Symptomatic but completely ambulatory  Medications:  Allergies as of 03/16/2024   No Known Allergies      Medication List        Accurate as of March 16, 2024 11:25 AM. If you have any questions, ask your nurse or doctor.          acetaminophen 500 MG tablet Commonly known as: TYLENOL Take 500 mg by mouth every 6 (six) hours as needed.   Cetirizine HCl 10 MG Caps Take 10 mg by mouth daily.   cyanocobalamin 1000 MCG tablet Commonly known as: VITAMIN B12 Take 1,000 mcg by mouth daily.   cyclobenzaprine 10 MG tablet Commonly known as: FLEXERIL Take 1 tablet (10 mg total) by mouth 3 (three) times daily as needed for muscle spasms.   Fish Oil 1200 MG Caps Take 1,200 mg by mouth daily.   folic acid 1 MG tablet Commonly known as: FOLVITE TAKE 1 TABLET BY MOUTH DAILY   GLUCOSAMINE 1500 COMPLEX PO Take 2,000 mg by mouth daily.   levothyroxine 137 MCG tablet Commonly known as:  SYNTHROID Take 1 tablet by mouth daily on Monday, Wednesday, Friday.   levothyroxine 150 MCG tablet Commonly known as: SYNTHROID TAKE 1 TABLET BY MOUTH ON TUESDAY, THURSDAY, SATURDAY,AND SUNDAY   multivitamin capsule Take 1 capsule by mouth daily.   rosuvastatin 10 MG tablet Commonly known as: CRESTOR TAKE 1 TABLET BY MOUTH DAILY   tadalafil 5 MG tablet Commonly known as: CIALIS Take 1 tablet (5 mg total) by mouth daily.   Vitamin D3 125 MCG (5000 UT) Caps Take 5,000 Units by mouth daily.        Allergies: No Known Allergies  Past Medical History, Surgical history, Social history, and Family History were reviewed and updated.  Review of Systems: All other 10 point review of systems is negative.   Physical Exam:  weight is 233 lb (105.7 kg). His oral temperature is 98.3 F (36.8 C). His blood pressure is 128/64 and his pulse is 74. His respiration is 18 and oxygen saturation is 97%.    Wt Readings from Last 3 Encounters:  03/16/24 233 lb (105.7 kg)  02/09/24 233 lb 9.6 oz (106 kg)  11/17/23 234 lb (106.1 kg)    Ocular: Sclerae unicteric, pupils equal, round and reactive to light Ear-nose-throat: Oropharynx clear, dentition fair Lymphatic: No cervical or supraclavicular adenopathy Lungs no rales or rhonchi, good excursion bilaterally Heart regular rate and rhythm, no murmur  appreciated Abd soft, nontender, positive bowel sounds MSK no focal spinal tenderness, no joint edema Neuro: non-focal, well-oriented, appropriate affect Breasts: Deferred   Lab Results  Component Value Date   WBC 3.8 (L) 03/16/2024   HGB 13.1 03/16/2024   HCT 37.9 (L) 03/16/2024   MCV 96.7 03/16/2024   PLT 130 (L) 03/16/2024   Lab Results  Component Value Date   FERRITIN 73.8 10/04/2023   IRON 104 10/04/2023   TIBC 413.0 10/04/2023   UIBC 316 08/15/2023   IRONPCTSAT 25.2 10/04/2023   Lab Results  Component Value Date   RBC 3.92 (L) 03/16/2024   No results found for:  "KPAFRELGTCHN", "LAMBDASER", "KAPLAMBRATIO" Lab Results  Component Value Date   IGGSERUM 981 10/04/2023   No results found for: "TOTALPROTELP", "ALBUMINELP", "A1GS", "A2GS", "BETS", "BETA2SER", "GAMS", "MSPIKE", "SPEI"   Chemistry      Component Value Date/Time   NA 137 03/16/2024 1035   K 4.7 03/16/2024 1035   CL 102 03/16/2024 1035   CO2 27 03/16/2024 1035   BUN 16 03/16/2024 1035   CREATININE 1.11 03/16/2024 1035   CREATININE 1.02 11/17/2020 0848      Component Value Date/Time   CALCIUM 10.0 03/16/2024 1035   ALKPHOS 86 03/16/2024 1035   AST 29 03/16/2024 1035   ALT 27 03/16/2024 1035   BILITOT 0.6 03/16/2024 1035      Impression and Plan: Timothy Grant is a very nice 78 yo caucasian gentleman with very mild thrombocytopenia and neutropenia.  His counts remain stable to improved at this time.  No intervention indicated.  Follow-up in 4 months.   Eileen Stanford, NP 4/11/202511:25 AM

## 2024-03-24 ENCOUNTER — Encounter (HOSPITAL_COMMUNITY): Payer: Self-pay | Admitting: *Deleted

## 2024-03-24 ENCOUNTER — Other Ambulatory Visit: Payer: Self-pay

## 2024-03-24 ENCOUNTER — Observation Stay (HOSPITAL_COMMUNITY)
Admission: EM | Admit: 2024-03-24 | Discharge: 2024-03-26 | Disposition: A | Discharge status: Home / Self Care | Attending: Internal Medicine | Admitting: Internal Medicine

## 2024-03-24 DIAGNOSIS — I214 Non-ST elevation (NSTEMI) myocardial infarction: Secondary | ICD-10-CM | POA: Diagnosis not present

## 2024-03-24 DIAGNOSIS — D61818 Other pancytopenia: Secondary | ICD-10-CM | POA: Diagnosis not present

## 2024-03-24 DIAGNOSIS — E039 Hypothyroidism, unspecified: Secondary | ICD-10-CM | POA: Insufficient documentation

## 2024-03-24 DIAGNOSIS — E782 Mixed hyperlipidemia: Secondary | ICD-10-CM | POA: Diagnosis not present

## 2024-03-24 DIAGNOSIS — Q2544 Congenital dilation of aorta: Secondary | ICD-10-CM | POA: Insufficient documentation

## 2024-03-24 DIAGNOSIS — R079 Chest pain, unspecified: Secondary | ICD-10-CM | POA: Diagnosis present

## 2024-03-24 DIAGNOSIS — Z87891 Personal history of nicotine dependence: Secondary | ICD-10-CM | POA: Insufficient documentation

## 2024-03-24 DIAGNOSIS — I351 Nonrheumatic aortic (valve) insufficiency: Secondary | ICD-10-CM | POA: Diagnosis not present

## 2024-03-24 DIAGNOSIS — Z79899 Other long term (current) drug therapy: Secondary | ICD-10-CM | POA: Diagnosis not present

## 2024-03-24 DIAGNOSIS — I77819 Aortic ectasia, unspecified site: Secondary | ICD-10-CM | POA: Insufficient documentation

## 2024-03-24 DIAGNOSIS — R030 Elevated blood-pressure reading, without diagnosis of hypertension: Secondary | ICD-10-CM | POA: Insufficient documentation

## 2024-03-24 NOTE — ED Triage Notes (Signed)
 Chest pain since yesterday after he had finished cutting his grass with a push mower  when he came into the house and sat down drinking some water the pain went away  he is complained with chest tightness all day with exertion but earlier tonight while watching tv the pain came back

## 2024-03-25 DIAGNOSIS — I214 Non-ST elevation (NSTEMI) myocardial infarction: Principal | ICD-10-CM | POA: Diagnosis present

## 2024-03-25 DIAGNOSIS — E7800 Pure hypercholesterolemia, unspecified: Secondary | ICD-10-CM | POA: Diagnosis not present

## 2024-03-25 LAB — LIPID PANEL
Cholesterol: 144 mg/dL (ref 0–200)
HDL: 52 mg/dL (ref >40)
LDL Cholesterol: 85 mg/dL (ref 0–99)
Total CHOL/HDL Ratio: 2.8 ratio
Triglycerides: 37 mg/dL (ref <150)
VLDL: 7 mg/dL (ref 0–40)

## 2024-03-25 LAB — CBC
HCT: 35.6 % — ABNORMAL LOW (ref 39.0–52.0)
HCT: 36.5 % — ABNORMAL LOW (ref 39.0–52.0)
HCT: 37.2 % — ABNORMAL LOW (ref 39.0–52.0)
Hemoglobin: 12 g/dL — ABNORMAL LOW (ref 13.0–17.0)
Hemoglobin: 12.2 g/dL — ABNORMAL LOW (ref 13.0–17.0)
Hemoglobin: 12.7 g/dL — ABNORMAL LOW (ref 13.0–17.0)
MCH: 33 pg (ref 26.0–34.0)
MCH: 33.1 pg (ref 26.0–34.0)
MCH: 33.5 pg (ref 26.0–34.0)
MCHC: 33.4 g/dL (ref 30.0–36.0)
MCHC: 33.7 g/dL (ref 30.0–36.0)
MCHC: 34.1 g/dL (ref 30.0–36.0)
MCV: 98.1 fL (ref 80.0–100.0)
MCV: 98.2 fL (ref 80.0–100.0)
MCV: 98.6 fL (ref 80.0–100.0)
Platelets: 117 10*3/uL — ABNORMAL LOW (ref 150–400)
Platelets: 117 10*3/uL — ABNORMAL LOW (ref 150–400)
Platelets: 128 10*3/uL — ABNORMAL LOW (ref 150–400)
RBC: 3.63 MIL/uL — ABNORMAL LOW (ref 4.22–5.81)
RBC: 3.7 MIL/uL — ABNORMAL LOW (ref 4.22–5.81)
RBC: 3.79 MIL/uL — ABNORMAL LOW (ref 4.22–5.81)
RDW: 13.2 % (ref 11.5–15.5)
RDW: 13.3 % (ref 11.5–15.5)
RDW: 13.3 % (ref 11.5–15.5)
WBC: 3.7 10*3/uL — ABNORMAL LOW (ref 4.0–10.5)
WBC: 3.7 10*3/uL — ABNORMAL LOW (ref 4.0–10.5)
WBC: 3.9 10*3/uL — ABNORMAL LOW (ref 4.0–10.5)
nRBC: 0 % (ref 0.0–0.2)
nRBC: 0 % (ref 0.0–0.2)
nRBC: 0 % (ref 0.0–0.2)

## 2024-03-25 LAB — TROPONIN I (HIGH SENSITIVITY)
Troponin I (High Sensitivity): 982 ng/L (ref <18)
Troponin I (High Sensitivity): 1128 ng/L (ref <18)
Troponin I (High Sensitivity): 5412 ng/L (ref <18)
Troponin I (High Sensitivity): 5382 ng/L (ref <18)

## 2024-03-25 LAB — HEPARIN LEVEL (UNFRACTIONATED): Heparin Unfractionated: 0.58 [IU]/mL (ref 0.30–0.70)

## 2024-03-25 LAB — BASIC METABOLIC PANEL WITH GFR
Anion gap: 11 (ref 5–15)
Anion gap: 11 (ref 5–15)
BUN: 15 mg/dL (ref 8–23)
BUN: 13 mg/dL (ref 8–23)
CO2: 23 mmol/L (ref 22–32)
CO2: 23 mmol/L (ref 22–32)
Calcium: 9.3 mg/dL (ref 8.9–10.3)
Calcium: 9.2 mg/dL (ref 8.9–10.3)
Chloride: 100 mmol/L (ref 98–111)
Chloride: 102 mmol/L (ref 98–111)
Creatinine, Ser: 1.14 mg/dL (ref 0.61–1.24)
Creatinine, Ser: 0.99 mg/dL (ref 0.61–1.24)
GFR, Estimated: >60 mL/min (ref >60)
GFR, Estimated: >60 mL/min (ref >60)
Glucose, Bld: 125 mg/dL — ABNORMAL HIGH (ref 70–99)
Glucose, Bld: 109 mg/dL — ABNORMAL HIGH (ref 70–99)
Potassium: 3.7 mmol/L (ref 3.5–5.1)
Potassium: 4 mmol/L (ref 3.5–5.1)
Sodium: 134 mmol/L — ABNORMAL LOW (ref 135–145)
Sodium: 136 mmol/L (ref 135–145)

## 2024-03-25 LAB — PROTIME-INR
INR: 1.1 (ref 0.8–1.2)
Prothrombin Time: 14.1 s (ref 11.4–15.2)

## 2024-03-25 MED ORDER — ROSUVASTATIN CALCIUM 5 MG PO TABS
10.0000 mg | ORAL_TABLET | Freq: Every day | ORAL | Status: DC
Start: 2024-03-25 — End: 2024-03-26
  Administered 2024-03-25: 10 mg via ORAL
  Filled 2024-03-25: qty 2

## 2024-03-25 MED ORDER — NITROGLYCERIN 0.4 MG SL SUBL: 0.4000 mg | SUBLINGUAL_TABLET | SUBLINGUAL | Status: DC | PRN

## 2024-03-25 MED ORDER — ASPIRIN 81 MG PO TBEC: 81.0000 mg | DELAYED_RELEASE_TABLET | Freq: Every day | ORAL | Status: DC

## 2024-03-25 MED ORDER — AMLODIPINE BESYLATE 2.5 MG PO TABS
2.5000 mg | ORAL_TABLET | Freq: Every day | ORAL | Status: DC
  Administered 2024-03-25 – 2024-03-26 (×2): 2.5 mg via ORAL
  Filled 2024-03-25 (×2): qty 1

## 2024-03-25 MED ORDER — ACETAMINOPHEN 325 MG PO TABS
650.0000 mg | ORAL_TABLET | ORAL | Status: DC | PRN
  Administered 2024-03-25: 650 mg via ORAL
  Filled 2024-03-25: qty 2

## 2024-03-25 MED ORDER — FOLIC ACID 1 MG PO TABS
1.0000 mg | ORAL_TABLET | Freq: Every day | ORAL | Status: DC
  Administered 2024-03-25 – 2024-03-26 (×2): 1 mg via ORAL
  Filled 2024-03-25 (×2): qty 1

## 2024-03-25 MED ORDER — LORATADINE 10 MG PO TABS
10.0000 mg | ORAL_TABLET | Freq: Every day | ORAL | Status: DC
  Administered 2024-03-25 – 2024-03-26 (×2): 10 mg via ORAL
  Filled 2024-03-25 (×2): qty 1

## 2024-03-25 MED ORDER — EZETIMIBE 10 MG PO TABS
10.0000 mg | ORAL_TABLET | Freq: Every day | ORAL | Status: DC
  Administered 2024-03-25 – 2024-03-26 (×2): 10 mg via ORAL
  Filled 2024-03-25 (×2): qty 1

## 2024-03-25 MED ORDER — HEPARIN (PORCINE) 25000 UT/250ML-% IV SOLN
1200.0000 [IU]/h | INTRAVENOUS | Status: DC
  Administered 2024-03-25 (×2): 1200 [IU]/h via INTRAVENOUS
  Filled 2024-03-25 (×2): qty 250

## 2024-03-25 MED ORDER — HEPARIN BOLUS VIA INFUSION
4000.0000 [IU] | Freq: Once | INTRAVENOUS | Status: AC
  Administered 2024-03-25: 4000 [IU] via INTRAVENOUS
  Filled 2024-03-25: qty 4000

## 2024-03-25 MED ORDER — ONDANSETRON HCL 4 MG/2ML IJ SOLN: 4.0000 mg | Freq: Four times a day (QID) | INTRAMUSCULAR | Status: DC | PRN

## 2024-03-25 MED ORDER — ASPIRIN 81 MG PO CHEW
324.0000 mg | CHEWABLE_TABLET | Freq: Once | ORAL | Status: AC
  Administered 2024-03-25: 324 mg via ORAL
  Filled 2024-03-25: qty 4

## 2024-03-25 MED ORDER — LEVOTHYROXINE SODIUM 75 MCG PO TABS: 150.0000 ug | ORAL_TABLET | ORAL | Status: DC

## 2024-03-25 MED ORDER — LEVOTHYROXINE SODIUM 25 MCG PO TABS
137.0000 ug | ORAL_TABLET | ORAL | Status: DC
  Administered 2024-03-26: 137 ug via ORAL
  Filled 2024-03-25: qty 1

## 2024-03-25 NOTE — Progress Notes (Signed)
 PHARMACY - ANTICOAGULATION CONSULT NOTE  Pharmacy Consult for IV heparin   Indication: chest pain/ACS  No Known Allergies  Patient Measurements: Height: 6\' 1"  (185.4 cm) Weight: 105.7 kg (233 lb 0.4 oz) IBW/kg (Calculated) : 79.9 HEPARIN  DW (KG): 101.6  Vital Signs: Temp: 98.2 F (36.8 C) (04/19 2338) BP: 129/82 (04/20 0100) Pulse Rate: 59 (04/20 0100)  Labs: Recent Labs    03/25/24 0005 03/25/24 0007  HGB 12.7*  --   HCT 37.2*  --   PLT 128*  --   LABPROT  --  14.1  INR  --  1.1  CREATININE 1.14  --   TROPONINIHS 982*  --     Estimated Creatinine Clearance: 68.1 mL/min (by C-G formula based on SCr of 1.14 mg/dL).   Medical History: Past Medical History:  Diagnosis Date   Adenomatous polyps    Allergy    seasonal   Arthritis    Cardiomegaly    Cataract    Cirrhosis [facility])    ED (erectile dysfunction)    Gallbladder polyp    Hepatitis A    Hyperlipidemia    Hypothyroidism    Non-traumatic rhabdomyolysis 02/21/2019   Thrombocytopenia (HCC)    Varicocele 02/01/2011   Left testicle   Assessment: Timothy Grant is a 78 y.o. year old male admitted on 03/24/2024 with CP and concern for ACS. No anticoagulation prior to admission. Pharmacy consulted to dose heparin .  Goal of Therapy:  Heparin  level 0.3-0.7 units/ml Monitor platelets by anticoagulation protocol: Yes   Plan:  Heparin  4000 units x 1 as bolus followed by heparin  infusion at 1200 units/hr 8 heparin  level  Daily heparin  level, CBC, and monitoring for bleeding F/u plans for anticoagulation and cards recs  Thank you for allowing pharmacy to participate in this patient's care.  Fonda Hymen, PharmD Emergency Medicine Clinical Pharmacist 03/25/2024,1:33 AM

## 2024-03-25 NOTE — ED Provider Notes (Signed)
 [facility] DEPT [facility] Emergency Department Provider Note MRN:  [id number]  Arrival date & time: 03/25/24     Chief Complaint   Chest Pain   History of Present Illness   Timothy Grant is a 78 y.o. year-old male with no pertinent past medical history presenting to the ED with chief complaint of chest pain.  Chest tightness yesterday after mowing the lawn.  Went away with rest.  Had some chest pain again doing yard work today, went away with rest.  Then began having the same chest tightness at 11 PM tonight while sitting comfortably on the couch.  Nonradiating pain, denies dizziness or diaphoresis, no nausea vomiting, no shortness of breath.  Review of Systems  A thorough review of systems was obtained and all systems are negative except as noted in the HPI and PMH.   Patient's Health History    Past Medical History:  Diagnosis Date   Adenomatous polyps    Allergy    seasonal   Arthritis    Cardiomegaly    Cataract    Cirrhosis (HCC)    ED (erectile dysfunction)    Gallbladder polyp    Hepatitis A    Hyperlipidemia    Hypothyroidism    Non-traumatic rhabdomyolysis 02/21/2019   Thrombocytopenia (HCC)    Varicocele 02/01/2011   Left testicle    Past Surgical History:  Procedure Laterality Date   CHOLECYSTECTOMY N/A 01/30/2021   Procedure: LAPAROSCOPIC CHOLECYSTECTOMY;  Surgeon: Lujean Sake, MD;  Location: [facility] ORS;  Service: General;  Laterality: N/A;  ROOM 1 STARTING AT 01:00PM   COLONOSCOPY  2014   EYE SURGERY Right 1956   EYE SURGERY Left 1963   LIVER BIOPSY     POLYPECTOMY     TONSILLECTOMY      Family History  Problem Relation Age of Onset   Alzheimer's disease Mother    Arthritis Mother    Dementia Mother    Stroke Mother    Heart disease Father    Uterine cancer Sister    Hyperlipidemia Brother    Stroke Brother    Breast cancer Brother    Colon polyps Brother    Lung cancer Brother    Heart disease Paternal Grandmother    Colon  cancer Neg Hx    Esophageal cancer Neg Hx    Rectal cancer Neg Hx    Stomach cancer Neg Hx     Social History   Socioeconomic History   Marital status: Married    Spouse name: Not on file   Number of children: 2   Years of education: Not on file   Highest education level: Not on file  Occupational History   Occupation: retired  Tobacco Use   Smoking status: Former    Types: Cigarettes, Cigars   Smokeless tobacco: Never   Tobacco comments:    quit 1975  Vaping Use   Vaping status: Never Used  Substance and Sexual Activity   Alcohol use: Not Currently    Alcohol/week: 6.0 standard drinks of alcohol    Types: 6 Cans of beer per week    Comment: Socially   Drug use: No   Sexual activity: Yes  Other Topics Concern   Not on file  Social History Narrative   Not on file   Social Drivers of Health   Financial Resource Strain: Low Risk  (05/15/2023)   Overall Financial Resource Strain (CARDIA)    Difficulty of Paying Living Expenses: Not hard at all  Food Insecurity: No  Food Insecurity (05/15/2023)   Hunger Vital Sign    Worried About Running Out of Food in the Last Year: Never true    Ran Out of Food in the Last Year: Never true  Transportation Needs: No Transportation Needs (05/15/2023)   PRAPARE - Administrator, Civil Service (Medical): No    Lack of Transportation (Non-Medical): No  Physical Activity: Sufficiently Active (05/15/2023)   Exercise Vital Sign    Days of Exercise per Week: 5 days    Minutes of Exercise per Session: 30 min  Stress: No Stress Concern Present (05/15/2023)   [employer] of Occupational Health - Occupational Stress Questionnaire    Feeling of Stress : Not at all  Social Connections: Moderately Isolated (05/15/2023)   Social Connection and Isolation Panel [NHANES]    Frequency of Communication with Friends and Family: More than three times a week    Frequency of Social Gatherings with Friends and Family: Twice a week    Attends  Religious Services: Never    Database administrator or Organizations: No    Attends Banker Meetings: Never    Marital Status: Married  Catering manager Violence: Not At Risk (05/16/2023)   Humiliation, Afraid, Rape, and Kick questionnaire    Fear of Current or Ex-Partner: No    Emotionally Abused: No    Physically Abused: No    Sexually Abused: No     Physical Exam   Vitals:   03/25/24 0030 03/25/24 0100  BP: 132/72 129/82  Pulse: 64 (!) 59  Resp: 17 17  Temp:    SpO2: 95% 96%    CONSTITUTIONAL: Well-appearing, NAD NEURO/PSYCH:  Alert and oriented x 3, no focal deficits EYES:  eyes equal and reactive ENT/NECK:  no LAD, no JVD CARDIO: Regular rate, well-perfused, normal S1 and S2 PULM:  CTAB no wheezing or rhonchi GI/GU:  non-distended, non-tender MSK/SPINE:  No gross deformities, no edema SKIN:  no rash, atraumatic   *Additional and/or pertinent findings included in MDM below  Diagnostic and Interventional Summary    EKG Interpretation Date/Time:  Saturday March 24 2024 23:48:52 EDT Ventricular Rate:  75 PR Interval:  174 QRS Duration:  96 QT Interval:  396 QTC Calculation: 442 R Axis:   -33  Text Interpretation: Sinus rhythm with occasional Premature ventricular complexes Left axis deviation Cannot rule out Anterior infarct , age undetermined Abnormal ECG When compared with ECG of 23-Jan-2021 11:27, PREVIOUS ECG IS PRESENT Confirmed by Gwenetta Lennert [phone]) on 03/24/2024 11:57:09 PM       Labs Reviewed  BASIC METABOLIC PANEL WITH GFR - Abnormal; Notable for the following components:      Result Value   Sodium 134 (*)    Glucose, Bld 125 (*)    All other components within normal limits  CBC - Abnormal; Notable for the following components:   WBC 3.7 (*)    RBC 3.79 (*)    Hemoglobin 12.7 (*)    HCT 37.2 (*)    Platelets 128 (*)    All other components within normal limits  TROPONIN I (HIGH SENSITIVITY) - Abnormal; Notable for the following  components:   Troponin I (High Sensitivity) 982 (*)    All other components within normal limits  PROTIME-INR  HEPARIN  LEVEL (UNFRACTIONATED)  CBC  TROPONIN I (HIGH SENSITIVITY)    No orders to display    Medications  heparin  bolus via infusion 4,000 Units (has no administration in time range)  heparin  ADULT infusion  100 units/mL (25000 units/250mL) (has no administration in time range)  aspirin  chewable tablet 324 mg (324 mg Oral Given 03/25/24 0041)     Procedures  /  Critical Care .Critical Care  Performed by: Edson Graces, MD Authorized by: Edson Graces, MD   Critical care provider statement:    Critical care time (minutes):  45   Critical care was necessary to treat or prevent imminent or life-threatening deterioration of the following conditions: NSTEMI.   Critical care was time spent personally by me on the following activities:  Development of treatment plan with patient or surrogate, discussions with consultants, evaluation of patient's response to treatment, examination of patient, ordering and review of laboratory studies, ordering and review of radiographic studies, ordering and performing treatments and interventions, pulse oximetry, re-evaluation of patient's condition and review of old charts   ED Course and Medical Decision Making  Initial Impression and Ddx Concern for ACS given the exertional nature over the past day or 2, now with it starting at rest there would be concern for unstable angina.  EKG is demonstrating some ST changes.  Subtle ST elevation in 1 and aVL.  I discussed the EKG with Dr. Teofilo Fellers of cardiology, does not meet STEMI criteria.  Patient otherwise looks well with normal vitals, will follow-up troponin.  Past medical/surgical history that increases complexity of ED encounter: Takes Cialis   Interpretation of Diagnostics I personally reviewed the EKG and my interpretation is as follows: ST segment changes concerning for ischemia  No  significant blood count or electrolyte disturbance.  Troponin 980  Patient Reassessment and Ultimate Disposition/Management     On reassessment patient continues to look well, pain is 2 out of 10, does not want anything for pain.  Has received aspirin , will start heparin .  Avoiding nitroglycerin  given the Cialis  which he took yesterday morning.  Dr. Teofilo Fellers of cardiology was consulted and will admit the patient.  Patient management required discussion with the following services or consulting groups:  Cardiology  Complexity of Problems Addressed Acute illness or injury that poses threat of life of bodily function  Additional Data Reviewed and Analyzed Further history obtained from: Further history from spouse/family member and Prior labs/imaging results  Additional Factors Impacting ED Encounter Risk Consideration of hospitalization  Merrick Abe. Harless Lien, MD [facility] Emergency Medicine [facility] [email or web address]  Final Clinical Impressions(s) / ED Diagnoses     ICD-10-CM   1. NSTEMI (non-ST elevated myocardial infarction) [facility])  I21.4       ED Discharge Orders     None        Discharge Instructions Discussed with and Provided to Patient:   Discharge Instructions   None      Edson Graces, MD 03/25/24 0157

## 2024-03-25 NOTE — Progress Notes (Signed)
 PHARMACY - ANTICOAGULATION CONSULT NOTE  Pharmacy Consult for IV heparin   Indication: chest pain/ACS  No Known Allergies  Patient Measurements: Height: 6\' 1"  (185.4 cm) Weight: 105.7 kg (233 lb 0.4 oz) IBW/kg (Calculated) : 79.9 HEPARIN  DW (KG): 101.6  Vital Signs: Temp: 98.2 F (36.8 C) (04/20 0354) BP: 129/77 (04/20 1100) Pulse Rate: 60 (04/20 1104)  Labs: Recent Labs    03/25/24 0005 03/25/24 0007 03/25/24 0155 03/25/24 0359 03/25/24 0830 03/25/24 1102  HGB 12.7*  --   --  12.0* 12.2*  --   HCT 37.2*  --   --  35.6* 36.5*  --   PLT 128*  --   --  117* 117*  --   LABPROT  --  14.1  --   --   --   --   INR  --  1.1  --   --   --   --   HEPARINUNFRC  --   --   --   --   --  0.58  CREATININE 1.14  --   --   --  0.99  --   TROPONINIHS 982*  --  1,128*  --   --   --     Estimated Creatinine Clearance: 78.5 mL/min (by C-G formula based on SCr of 0.99 mg/dL).   Medical History: Past Medical History:  Diagnosis Date   Adenomatous polyps    Allergy    seasonal   Arthritis    Cardiomegaly    Cataract    Cirrhosis [facility])    ED (erectile dysfunction)    Gallbladder polyp    Hepatitis A    Hyperlipidemia    Hypothyroidism    Non-traumatic rhabdomyolysis 02/21/2019   Thrombocytopenia (HCC)    Varicocele 02/01/2011   Left testicle   Assessment: Timothy Grant is a 78 y.o. year old male admitted on 03/24/2024 with CP and concern for ACS. No anticoagulation prior to admission. Pharmacy consulted to dose heparin .  Heparin  level therapeutic on 1200 units/hr, plan for cath in AM tomorrow  Goal of Therapy:  Heparin  level 0.3-0.7 units/ml Monitor platelets by anticoagulation protocol: Yes   Plan:  Continue heparin  gtt at 1200 units/hr Daily heparin  level, CBC, s/s bleeding F/u cath tomorrow AM  Trinidad Funk, PharmD, [facility] Clinical Pharmacist ED Pharmacist Phone # [phone] 03/25/2024 12:03 PM

## 2024-03-25 NOTE — Progress Notes (Addendum)
   Progress Note  Patient Name: Timothy Grant Date of Encounter: 03/25/2024  Primary Cardiologist: None  Subjective   No chest pains  Assessment & Plan    NSTEMI: Presented with exertional chest pain that progressed to rest, waxing and waning until he arrived to the ER when chest pain resolved after 5 to 10 minutes. EKG on admission showed NSR, subtle ST depressions in the anterolateral leads that resolved on the repeat EKG today. Hs troponins elevated, 982>>1,128.  Continue ACS protocol.  Continue aspirin  81 mg once daily, will increase rosuvastatin  from 10 to 20 mg nightly, Zetia  10 mg once daily, heparin  drip.  No evidence of active chest pain.  On daily tadalafil  at home. He will need LHC tomorrow for evaluation of NSTEMI.  Echocardiogram is pending.  Informed consent for LHC Risks and benefits of cardiac catheterization have been discussed with the patient.  These include bleeding, infection, kidney damage, stroke, heart attack, death.  The patient understands these risks and is willing to proceed.  HLD, not at goal: LDL 85.  Goal LDL should be less than 55.  Increase rosuvastatin  dose from 10 mg to 20 mg nightly and continue Zetia  10 mg once daily.  Dilatation of aortic root and ascending aorta in 2020 on echocardiogram: Repeat echocardiogram.  Pancytopenia, stable: Follows hematology.  Platelets more than 50K, okay for antiplatelets and LHC.  No active bleeding.  Elevated blood pressure without diagnosis of HTN: Goal BP less than 140/90 mmHg. Will start amlodipine  2.5 mg once daily.   Signed, Lasalle Pointer, MD  03/25/2024, 12:41 PM

## 2024-03-25 NOTE — Progress Notes (Signed)
 Arrived to [facility] 3E 15 from Emergency Department.  Placed on telemetry monitor and vitals obtained.     03/25/24 1304 03/25/24 1313 03/25/24 1330  Vitals  Temp 97.9 F (36.6 C)  --   --   Temp Source Oral  --   --   BP 127/83  --   --   MAP (mmHg) 97  --   --   BP Location Left Arm  --   --   BP Method Automatic  --   --   Patient Position (if appropriate) Sitting  --   --   Pulse Rate 64  --   --   Pulse Rate Source Monitor  --   --   ECG Heart Rate 65  --   --   Resp 18  --   --   Level of Consciousness  Level of Consciousness  --   --  Alert  MEWS COLOR  MEWS Score Color Green  --  Green  Oxygen Therapy  SpO2 99 %  --   --   O2 Device Room Air  --   --   Pain Assessment  Pain Scale 0-10  --   --   Pain Score 0  --   --   ECG Monitoring  PR interval  --  0.16  --   QRS interval  --  0.11  --   QT interval  --  0.38  --   QTc interval  --  0.41  --   CV Strip Heart Rate  --  79  --   Cardiac Rhythm  --  NSR (Admit)  --   Telemetry Box Number  --   --  [id number]  Tele Box Verification Completed by Second Verifier  --   --  Completed

## 2024-03-25 NOTE — H&P (Addendum)
 Cardiology Admission History and Physical   Patient ID: Timothy Grant MRN: [id number]; DOB: Jun 03, 1946   Admission date: 03/24/2024  PCP:  Luevenia Saha, MD   [facility] Providers Cardiologist:  None       Chief Complaint:  Chest pain  Patient Profile:   Timothy Grant is a 78 y.o. male with a significant past medical history of hypercholesteremia, compensated cirrhosis secondary to prior acute hepatitis A, thoracic aorta aneurysm, chronic pancytopenia, hypothyroidism, remote history of cigarette use, and erectile dysfunction who is being seen 03/25/2024 for the evaluation of chest pain.  History of Present Illness:   Timothy Grant reports the day prior at around 3 pm after mowing his lawn, he noticed chest pain at the center of his chest.  He describes it as pressure-like/squeezing, non-radiating, without any other associated symptoms, and relieved with resting after 10-15 minutes.  He was able to go about his normal day without recurrence.  Yesterday, while doing additional lawn work, his symptoms recurred and eventually resolved with rest after 10-15 minutes.  He states that while sitting watching television around 11 pm yesterday, his symptoms recurred.  He decided to drive to our emergency department for further evaluation.  He is now chest pain free.  He denies prior history of similar chest pain.  Patient also denies shortness of breath with exertion, swelling in his legs, palpitations, shortness of breath with lying flat, or waking up due to shortness of breath.    In the emergency department, initial ECG demonstrated non-specific ST changes.  Initial HS-troponin was 982 ng/L.  He was given high-dose aspirin  and started on a heparin  drip prior to admission.     Past Medical History:  Diagnosis Date   Adenomatous polyps    Allergy    seasonal   Arthritis    Cardiomegaly    Cataract    Cirrhosis [facility])    ED (erectile dysfunction)    Gallbladder polyp     Hepatitis A    Hyperlipidemia    Hypothyroidism    Non-traumatic rhabdomyolysis 02/21/2019   Thrombocytopenia (HCC)    Varicocele 02/01/2011   Left testicle    Past Surgical History:  Procedure Laterality Date   CHOLECYSTECTOMY N/A 01/30/2021   Procedure: LAPAROSCOPIC CHOLECYSTECTOMY;  Surgeon: Lujean Sake, MD;  Location: [facility];  Service: General;  Laterality: N/A;  ROOM 1 STARTING AT 01:00PM   COLONOSCOPY  2014   EYE SURGERY Right 1956   EYE SURGERY Left 1963   LIVER BIOPSY     POLYPECTOMY     TONSILLECTOMY       Medications Prior to Admission: Prior to Admission medications   Medication Sig Start Date End Date Taking? Authorizing Provider  acetaminophen  (TYLENOL ) 500 MG tablet Take 500 mg by mouth every 6 (six) hours as needed.    [provider]  Cetirizine HCl 10 MG CAPS Take 10 mg by mouth daily. 08/07/23   [provider]  Cholecalciferol (VITAMIN D3) 125 MCG (5000 UT) CAPS Take 5,000 Units by mouth daily.    [provider]  cyanocobalamin  (VITAMIN B12) 1000 MCG tablet Take 1,000 mcg by mouth daily. 09/25/23   [provider]  cyclobenzaprine  (FLEXERIL ) 10 MG tablet Take 1 tablet (10 mg total) by mouth 3 (three) times daily as needed for muscle spasms. 02/09/24   Luevenia Saha, MD  folic acid  (FOLVITE ) 1 MG tablet TAKE 1 TABLET BY MOUTH DAILY 12/05/23   Luevenia Saha, MD  Glucosamine-Chondroit-Vit C-Mn (  GLUCOSAMINE 1500 COMPLEX PO) Take 2,000 mg by mouth daily.    [provider]  levothyroxine  (SYNTHROID ) 137 MCG tablet Take 1 tablet by mouth daily on Monday, Wednesday, Friday. 03/06/24   Luevenia Saha, MD  levothyroxine  (SYNTHROID ) 150 MCG tablet TAKE 1 TABLET BY MOUTH ON TUESDAY, THURSDAY, SATURDAY,AND SUNDAY 03/06/24   Almira Jaeger, MD  Multiple Vitamin (MULTIVITAMIN) capsule Take 1 capsule by mouth daily.     [provider]  Omega-3 Fatty Acids (FISH OIL) 1200 MG CAPS Take 1,200 mg by mouth daily.    [provider]  rosuvastatin  (CRESTOR ) 10 MG tablet TAKE 1 TABLET BY MOUTH DAILY 11/05/23   Webb, Padonda B, FNP  tadalafil  (CIALIS ) 5 MG tablet Take 1 tablet (5 mg total) by mouth daily. 02/09/24   Luevenia Saha, MD     Allergies:   No Known Allergies  Social History:   Social History   Socioeconomic History   Marital status: Married    Spouse name: Not on file   Number of children: 2   Years of education: Not on file   Highest education level: Not on file  Occupational History   Occupation: retired  Tobacco Use   Smoking status: Former    Types: Cigarettes, Cigars   Smokeless tobacco: Never   Tobacco comments:    quit 1975  Vaping Use   Vaping status: Never Used  Substance and Sexual Activity   Alcohol use: Not Currently    Alcohol/week: 6.0 standard drinks of alcohol    Types: 6 Cans of beer per week    Comment: Socially   Drug use: No   Sexual activity: Yes  Other Topics Concern   Not on file  Social History Narrative   Not on file   Social Drivers of Health   Financial Resource Strain: Low Risk  (05/15/2023)   Overall Financial Resource Strain (CARDIA)    Difficulty of Paying Living Expenses: Not hard at all  Food Insecurity: No Food Insecurity (05/15/2023)   Hunger Vital Sign    Worried About Running Out of Food in the Last Year: Never true    Ran Out of Food in the Last Year: Never true  Transportation Needs: No Transportation Needs (05/15/2023)   PRAPARE - Administrator, Civil Service (Medical): No    Lack of Transportation (Non-Medical): No  Physical Activity: Sufficiently Active (05/15/2023)   Exercise Vital Sign    Days of Exercise per Week: 5 days    Minutes of Exercise per Session: 30 min  Stress: No Stress Concern Present (05/15/2023)   [employer] of Occupational Health - Occupational Stress Questionnaire    Feeling of Stress : Not at all  Social Connections: Moderately Isolated (05/15/2023)   Social Connection and Isolation Panel  [NHANES]    Frequency of Communication with Friends and Family: More than three times a week    Frequency of Social Gatherings with Friends and Family: Twice a week    Attends Religious Services: Never    Database administrator or Organizations: No    Attends Banker Meetings: Never    Marital Status: Married  Catering manager Violence: Not At Risk (05/16/2023)   Humiliation, Afraid, Rape, and Kick questionnaire    Fear of Current or Ex-Partner: No    Emotionally Abused: No    Physically Abused: No    Sexually Abused: No    Family History:   The patient's family history includes Alzheimer's  disease in his mother; Arthritis in his mother; Breast cancer in his brother; Colon polyps in his brother; Dementia in his mother; Heart disease in his father and paternal grandmother; Hyperlipidemia in his brother; Lung cancer in his brother; Stroke in his brother and mother; Uterine cancer in his sister. There is no history of Colon cancer, Esophageal cancer, Rectal cancer, or Stomach cancer.    ROS:  Please see the history of present illness.  All other ROS reviewed and negative.     Physical Exam/Data:   Vitals:   03/25/24 0015 03/25/24 0030 03/25/24 0100 03/25/24 0200  BP: 131/82 132/72 129/82 (!) 140/85  Pulse: 66 64 (!) 59 (!) 58  Resp: 18 17 17 11   Temp:      SpO2: 95% 95% 96% 100%  Weight:      Height:       No intake or output data in the 24 hours ending 03/25/24 0255    03/24/2024   11:51 PM 03/16/2024   11:00 AM 02/09/2024    9:59 AM  Last 3 Weights  Weight (lbs) 233 lb 0.4 oz 233 lb 233 lb 9.6 oz  Weight (kg) 105.7 kg 105.688 kg 105.96 kg     Body mass index is 30.74 kg/m.  General:  Well nourished, well developed, in no acute distress HEENT: normal Neck: no JVD Vascular: No carotid bruits; Distal pulses 2+ bilaterally   Cardiac:  normal S1, S2; RRR; no murmur  Lungs:  clear to auscultation bilaterally, no wheezing, rhonchi or rales  Abd: soft, nontender, no  hepatomegaly  Ext: no edema Musculoskeletal:  No deformities, BUE and BLE strength normal and equal Skin: warm and dry  Neuro:  CNs 2-12 intact, no focal abnormalities noted Psych:  Normal affect    EKG:  The ECG that was done 03/24/24 was personally reviewed and demonstrates: NSR; incomplete RBBB; non-specific ST changes  Relevant CV Studies: # Echocardiogram 06/25/19: IMPRESSIONS   1. The left ventricle has normal systolic function with an ejection  fraction of 60-65%. The cavity size was normal. There is moderate  concentric left ventricular hypertrophy. Left ventricular diastolic  Doppler parameters are consistent with impaired  relaxation. No evidence of left ventricular regional wall motion  abnormalities.   2. The right ventricle has normal systolic function. The cavity was  mildly enlarged. There is no increase in right ventricular wall thickness.  Right ventricular systolic pressure is normal.   3. Left atrial size was mildly dilated.   4. No evidence of mitral valve stenosis.   5. The aortic valve is grossly normal. Mild thickening of the aortic  valve. Mild calcification of the aortic valve. No stenosis of the aortic  valve.   6. The aortic arch is normal in size and structure.   7. There is mild to moderate dilatation of the aortic root and of the  ascending aorta measuring 43 mm.   Laboratory Data:  High Sensitivity Troponin:   Recent Labs  Lab 03/25/24 0005 03/25/24 0155  TROPONINIHS 982* 1,128*      Chemistry Recent Labs  Lab 03/25/24 0005  NA 134*  K 3.7  CL 100  CO2 23  GLUCOSE 125*  BUN 15  CREATININE 1.14  CALCIUM  9.3  GFRNONAA >60  ANIONGAP 11    No results for input(s): "PROT", "ALBUMIN", "AST", "ALT", "ALKPHOS", "BILITOT" in the last 168 hours. Lipids No results for input(s): "CHOL", "TRIG", "HDL", "LABVLDL", "LDLCALC", "CHOLHDL" in the last 168 hours. Hematology Recent Labs  Lab  03/25/24 0005  WBC 3.7*  RBC 3.79*  HGB 12.7*  HCT  37.2*  MCV 98.2  MCH 33.5  MCHC 34.1  RDW 13.3  PLT 128*   Thyroid  No results for input(s): "TSH", "FREET4" in the last 168 hours. BNPNo results for input(s): "BNP", "PROBNP" in the last 168 hours.  DDimer No results for input(s): "DDIMER" in the last 168 hours.   Radiology/Studies:  No results found.  Assessment and Plan:   Chest pain/NSTEMI-ACS: Patient with acute chest pain in the setting of an elevated HS-troponin, concerning for likely NSTEMI-ACS.  He is now chest pain free and hemodynamically stable.  ECG is without signs of infarct/ischemia.  Patient was initiated on heparin  drip and given high-dose aspirin . --Continue aspirin  and heparin  drip. --Refer for coronary angiography for Monday. --Arrange for an echocardiogram. --Check lipid pane and A1c. --Start ezetimibe  and continue rosuvastatin  at 10 mg daily. --Hold on beta-blocker given bradycardia.    Hypercholesteremia: Patient with known hypercholesteremia.  Currently taking rosuvastatin  10 mg daily.  He reports a prior history of rhabdomyolysis for which his rosuvastatin  was taken from 20 mg to 10 mg daily.  Most recent LDL on 02/02/24 was 82 mg/dL.   --Continue rosuvastatin  10 mg daily. --Initiate ezetimibe  10 mg daily for better LDL control.    Hypothyroidism: --Restart home levothyroxine .    Thoracic aorta aneurysm: Mild aortic root dilation noted on echocardiogram performed in 2020.  No additional surveillance of thoracic aorta aneurysm noted in our system since 2020. --Repeat echocardiogram as above to further assess.    Elevated blood pressure: Patient with initially elevated blood pressure but without a prior history of hypertension.  Blood pressure trending downward without medications.   --Monitor for now.   --Consider lisinopril as first choice for blood pressure if needed given thoracic aorta aneurysm noted above.    6.   Pancytopenia: Patient with chronic pancytopenia that's stable.  Followed by  hematology.  Degree of thrombocytopenia does not restrict antiplatelet use. --No change in management.    Risk Assessment/Risk Scores:    TIMI Risk Score for Unstable Angina or Non-ST Elevation MI:   The patient's TIMI risk score is 3, which indicates a 13% risk of all cause mortality, new or recurrent myocardial infarction or need for urgent revascularization in the next 14 days.      Code Status: Full Code  Severity of Illness: The appropriate patient status for this patient is INPATIENT. Inpatient status is judged to be reasonable and necessary in order to provide the required intensity of service to ensure the patient's safety. The patient's presenting symptoms, physical exam findings, and initial radiographic and laboratory data in the context of their chronic comorbidities is felt to place them at high risk for further clinical deterioration. Furthermore, it is not anticipated that the patient will be medically stable for discharge from the hospital within 2 midnights of admission.   * I certify that at the point of admission it is my clinical judgment that the patient will require inpatient hospital care spanning beyond 2 midnights from the point of admission due to high intensity of service, high risk for further deterioration and high frequency of surveillance required.*   For questions or updates, please contact [facility] Please consult [email or web address] for contact info under     Signed, Antonieta Battle, MD  03/25/2024 2:55 AM

## 2024-03-25 NOTE — H&P (View-Only) (Signed)
   Progress Note  Patient Name: Nollie Terlizzi Date of Encounter: 03/25/2024  Primary Cardiologist: None  Subjective   No chest pains  Assessment & Plan    NSTEMI: Presented with exertional chest pain that progressed to rest, waxing and waning until he arrived to the ER when chest pain resolved after 5 to 10 minutes. EKG on admission showed NSR, subtle ST depressions in the anterolateral leads that resolved on the repeat EKG today. Hs troponins elevated, 982>>1,128.  Continue ACS protocol.  Continue aspirin  81 mg once daily, will increase rosuvastatin  from 10 to 20 mg nightly, Zetia  10 mg once daily, heparin  drip.  No evidence of active chest pain.  On daily tadalafil  at home. He will need LHC tomorrow for evaluation of NSTEMI.  Echocardiogram is pending.  Informed consent for LHC Risks and benefits of cardiac catheterization have been discussed with the patient.  These include bleeding, infection, kidney damage, stroke, heart attack, death.  The patient understands these risks and is willing to proceed.  HLD, not at goal: LDL 85.  Goal LDL should be less than 55.  Increase rosuvastatin  dose from 10 mg to 20 mg nightly and continue Zetia  10 mg once daily.  Dilatation of aortic root and ascending aorta in 2020 on echocardiogram: Repeat echocardiogram.  Pancytopenia, stable: Follows hematology.  Platelets more than 50K, okay for antiplatelets and LHC.  No active bleeding.  Elevated blood pressure without diagnosis of HTN: Goal BP less than 140/90 mmHg. Will start amlodipine  2.5 mg once daily.   Signed, Lasalle Pointer, MD  03/25/2024, 12:41 PM

## 2024-03-26 ENCOUNTER — Telehealth: Payer: Self-pay | Admitting: Physician Assistant

## 2024-03-26 ENCOUNTER — Other Ambulatory Visit (HOSPITAL_COMMUNITY): Payer: Self-pay

## 2024-03-26 ENCOUNTER — Encounter (HOSPITAL_COMMUNITY): Payer: Self-pay | Admitting: Internal Medicine

## 2024-03-26 ENCOUNTER — Inpatient Hospital Stay (HOSPITAL_BASED_OUTPATIENT_CLINIC_OR_DEPARTMENT_OTHER)

## 2024-03-26 ENCOUNTER — Encounter (HOSPITAL_COMMUNITY)
Admission: EM | Disposition: A | Discharge status: Home / Self Care | Payer: Self-pay | Source: Home / Self Care | Attending: Emergency Medicine

## 2024-03-26 DIAGNOSIS — I214 Non-ST elevation (NSTEMI) myocardial infarction: Secondary | ICD-10-CM | POA: Diagnosis not present

## 2024-03-26 DIAGNOSIS — E785 Hyperlipidemia, unspecified: Secondary | ICD-10-CM | POA: Diagnosis not present

## 2024-03-26 DIAGNOSIS — R030 Elevated blood-pressure reading, without diagnosis of hypertension: Secondary | ICD-10-CM | POA: Insufficient documentation

## 2024-03-26 DIAGNOSIS — I2510 Atherosclerotic heart disease of native coronary artery without angina pectoris: Secondary | ICD-10-CM | POA: Diagnosis not present

## 2024-03-26 DIAGNOSIS — I351 Nonrheumatic aortic (valve) insufficiency: Secondary | ICD-10-CM | POA: Insufficient documentation

## 2024-03-26 DIAGNOSIS — I77819 Aortic ectasia, unspecified site: Secondary | ICD-10-CM | POA: Insufficient documentation

## 2024-03-26 LAB — ECHOCARDIOGRAM COMPLETE
AR max vel: 2.58 cm2
AV Peak grad: 7.6 mmHg
Ao pk vel: 1.38 m/s
Area-P 1/2: 2.13 cm2
Height: 73 in
S' Lateral: 3 cm
Weight: 3664 [oz_av]

## 2024-03-26 LAB — BASIC METABOLIC PANEL WITH GFR
Anion gap: 10 (ref 5–15)
Anion gap: 9 (ref 5–15)
BUN: 12 mg/dL (ref 8–23)
BUN: 10 mg/dL (ref 8–23)
CO2: 22 mmol/L (ref 22–32)
CO2: 24 mmol/L (ref 22–32)
Calcium: 9.2 mg/dL (ref 8.9–10.3)
Calcium: 9.2 mg/dL (ref 8.9–10.3)
Chloride: 103 mmol/L (ref 98–111)
Chloride: 103 mmol/L (ref 98–111)
Creatinine, Ser: 1.11 mg/dL (ref 0.61–1.24)
Creatinine, Ser: 1.01 mg/dL (ref 0.61–1.24)
GFR, Estimated: >60 mL/min (ref >60)
GFR, Estimated: >60 mL/min (ref >60)
Glucose, Bld: 105 mg/dL — ABNORMAL HIGH (ref 70–99)
Glucose, Bld: 108 mg/dL — ABNORMAL HIGH (ref 70–99)
Potassium: 4 mmol/L (ref 3.5–5.1)
Potassium: 4.3 mmol/L (ref 3.5–5.1)
Sodium: 135 mmol/L (ref 135–145)
Sodium: 136 mmol/L (ref 135–145)

## 2024-03-26 LAB — CBC
HCT: 36.4 % — ABNORMAL LOW (ref 39.0–52.0)
HCT: 36.2 % — ABNORMAL LOW (ref 39.0–52.0)
Hemoglobin: 12.3 g/dL — ABNORMAL LOW (ref 13.0–17.0)
Hemoglobin: 12.4 g/dL — ABNORMAL LOW (ref 13.0–17.0)
MCH: 32.8 pg (ref 26.0–34.0)
MCH: 33.2 pg (ref 26.0–34.0)
MCHC: 33.8 g/dL (ref 30.0–36.0)
MCHC: 34.3 g/dL (ref 30.0–36.0)
MCV: 97.1 fL (ref 80.0–100.0)
MCV: 97.1 fL (ref 80.0–100.0)
Platelets: 115 10*3/uL — ABNORMAL LOW (ref 150–400)
Platelets: 105 10*3/uL — ABNORMAL LOW (ref 150–400)
RBC: 3.75 MIL/uL — ABNORMAL LOW (ref 4.22–5.81)
RBC: 3.73 MIL/uL — ABNORMAL LOW (ref 4.22–5.81)
RDW: 13.2 % (ref 11.5–15.5)
RDW: 13.2 % (ref 11.5–15.5)
WBC: 4.2 10*3/uL (ref 4.0–10.5)
WBC: 4 10*3/uL (ref 4.0–10.5)
nRBC: 0 % (ref 0.0–0.2)
nRBC: 0 % (ref 0.0–0.2)

## 2024-03-26 LAB — HEPARIN LEVEL (UNFRACTIONATED): Heparin Unfractionated: 0.56 [IU]/mL (ref 0.30–0.70)

## 2024-03-26 SURGERY — LEFT HEART CATH AND CORONARY ANGIOGRAPHY
Anesthesia: LOCAL

## 2024-03-26 MED ORDER — HEPARIN SODIUM (PORCINE) 1000 UNIT/ML IJ SOLN
INTRAMUSCULAR | Status: DC | PRN
  Administered 2024-03-26: 5000 [IU] via INTRAVENOUS

## 2024-03-26 MED ORDER — SODIUM CHLORIDE 0.9% FLUSH
3.0000 mL | Freq: Two times a day (BID) | INTRAVENOUS | Status: DC
  Administered 2024-03-26: 3 mL via INTRAVENOUS

## 2024-03-26 MED ORDER — SODIUM CHLORIDE 0.9 % WEIGHT BASED INFUSION
3.0000 mL/kg/h | INTRAVENOUS | Status: AC
  Administered 2024-03-26: 3 mL/kg/h via INTRAVENOUS

## 2024-03-26 MED ORDER — NITROGLYCERIN 0.4 MG SL SUBL
0.4000 mg | SUBLINGUAL_TABLET | SUBLINGUAL | 3 refills | Status: AC | PRN
  Filled 2024-03-26: qty 25, 5d supply, fill #0

## 2024-03-26 MED ORDER — VERAPAMIL HCL 2.5 MG/ML IV SOLN
INTRAVENOUS | Status: AC
  Filled 2024-03-26: qty 2

## 2024-03-26 MED ORDER — ROSUVASTATIN CALCIUM 20 MG PO TABS: 20.0000 mg | ORAL_TABLET | Freq: Every day | ORAL | Status: DC

## 2024-03-26 MED ORDER — FENTANYL CITRATE (PF) 100 MCG/2ML IJ SOLN
INTRAMUSCULAR | Status: AC
  Filled 2024-03-26: qty 2

## 2024-03-26 MED ORDER — IOHEXOL 350 MG/ML SOLN
INTRAVENOUS | Status: DC | PRN
  Administered 2024-03-26: 80 mL

## 2024-03-26 MED ORDER — CLOPIDOGREL BISULFATE 75 MG PO TABS
300.0000 mg | ORAL_TABLET | Freq: Once | ORAL | Status: AC
Start: 2024-03-26 — End: 2024-03-26
  Administered 2024-03-26: 300 mg via ORAL
  Filled 2024-03-26: qty 4

## 2024-03-26 MED ORDER — LABETALOL HCL 5 MG/ML IV SOLN: 10.0000 mg | INTRAVENOUS | Status: DC | PRN

## 2024-03-26 MED ORDER — ASPIRIN 81 MG PO TBEC
81.0000 mg | DELAYED_RELEASE_TABLET | Freq: Every day | ORAL | 11 refills | Status: AC
  Filled 2024-03-26: qty 30, 30d supply, fill #0

## 2024-03-26 MED ORDER — SODIUM CHLORIDE 0.9 % IV SOLN: 250.0000 mL | INTRAVENOUS | Status: DC | PRN

## 2024-03-26 MED ORDER — HEPARIN SODIUM (PORCINE) 1000 UNIT/ML IJ SOLN
INTRAMUSCULAR | Status: AC
  Filled 2024-03-26: qty 10

## 2024-03-26 MED ORDER — ASPIRIN 81 MG PO CHEW
81.0000 mg | CHEWABLE_TABLET | ORAL | Status: AC
  Administered 2024-03-26: 81 mg via ORAL
  Filled 2024-03-26: qty 1

## 2024-03-26 MED ORDER — ENOXAPARIN SODIUM 40 MG/0.4ML IJ SOSY: 40.0000 mg | PREFILLED_SYRINGE | INTRAMUSCULAR | Status: DC

## 2024-03-26 MED ORDER — HYDRALAZINE HCL 20 MG/ML IJ SOLN: 10.0000 mg | INTRAMUSCULAR | Status: DC | PRN

## 2024-03-26 MED ORDER — ISOSORBIDE MONONITRATE ER 30 MG PO TB24
30.0000 mg | ORAL_TABLET | Freq: Every day | ORAL | Status: DC
  Administered 2024-03-26: 30 mg via ORAL
  Filled 2024-03-26: qty 1

## 2024-03-26 MED ORDER — VERAPAMIL HCL 2.5 MG/ML IV SOLN
INTRAVENOUS | Status: DC | PRN
  Administered 2024-03-26 (×2): 10 mL via INTRA_ARTERIAL

## 2024-03-26 MED ORDER — EZETIMIBE 10 MG PO TABS
10.0000 mg | ORAL_TABLET | Freq: Every day | ORAL | 5 refills | Status: DC
  Filled 2024-03-26: qty 30, 30d supply, fill #0

## 2024-03-26 MED ORDER — CLOPIDOGREL BISULFATE 75 MG PO TABS: 75.0000 mg | ORAL_TABLET | Freq: Every day | ORAL | Status: DC

## 2024-03-26 MED ORDER — MIDAZOLAM HCL 2 MG/2ML IJ SOLN
INTRAMUSCULAR | Status: DC | PRN
  Administered 2024-03-26: 1 mg via INTRAVENOUS

## 2024-03-26 MED ORDER — ROSUVASTATIN CALCIUM 5 MG PO TABS: 10.0000 mg | ORAL_TABLET | Freq: Every day | ORAL | Status: DC

## 2024-03-26 MED ORDER — AMLODIPINE BESYLATE 2.5 MG PO TABS
2.5000 mg | ORAL_TABLET | Freq: Every day | ORAL | 5 refills | Status: DC
  Filled 2024-03-26: qty 30, 30d supply, fill #0

## 2024-03-26 MED ORDER — MIDAZOLAM HCL 2 MG/2ML IJ SOLN
INTRAMUSCULAR | Status: AC
  Filled 2024-03-26: qty 2

## 2024-03-26 MED ORDER — FENTANYL CITRATE (PF) 100 MCG/2ML IJ SOLN
INTRAMUSCULAR | Status: DC | PRN
  Administered 2024-03-26: 25 ug via INTRAVENOUS

## 2024-03-26 MED ORDER — SODIUM CHLORIDE 0.9 % IV SOLN
INTRAVENOUS | Status: AC | PRN
  Administered 2024-03-26: 10 mL/h via INTRAVENOUS

## 2024-03-26 MED ORDER — SODIUM CHLORIDE 0.9% FLUSH: 3.0000 mL | INTRAVENOUS | Status: DC | PRN

## 2024-03-26 MED ORDER — ISOSORBIDE MONONITRATE ER 30 MG PO TB24
30.0000 mg | ORAL_TABLET | Freq: Every day | ORAL | 5 refills | Status: DC
  Filled 2024-03-26: qty 30, 30d supply, fill #0

## 2024-03-26 MED ORDER — HEPARIN (PORCINE) IN NACL 1000-0.9 UT/500ML-% IV SOLN
INTRAVENOUS | Status: DC | PRN
  Administered 2024-03-26 (×2): 500 mL

## 2024-03-26 MED ORDER — CLOPIDOGREL BISULFATE 75 MG PO TABS
75.0000 mg | ORAL_TABLET | Freq: Every day | ORAL | 11 refills | Status: DC
  Filled 2024-03-26: qty 30, 30d supply, fill #0

## 2024-03-26 MED ORDER — SODIUM CHLORIDE 0.9 % WEIGHT BASED INFUSION
1.0000 mL/kg/h | INTRAVENOUS | Status: DC
  Administered 2024-03-26: 1 mL/kg/h via INTRAVENOUS

## 2024-03-26 MED ORDER — LIDOCAINE HCL (PF) 1 % IJ SOLN
INTRAMUSCULAR | Status: AC
  Filled 2024-03-26: qty 30

## 2024-03-26 MED ORDER — LIDOCAINE HCL (PF) 1 % IJ SOLN
INTRAMUSCULAR | Status: DC | PRN
  Administered 2024-03-26: 5 mL via INTRADERMAL

## 2024-03-26 SURGICAL SUPPLY — 11 items
CATH INFINITI 5 FR AL2 (CATHETERS) IMPLANT
CATH INFINITI 5 FR MPA2 (CATHETERS) IMPLANT
CATH INFINITI 5F PIG 125CM (CATHETERS) IMPLANT
CATH INFINITI 5FR MULTPACK ANG (CATHETERS) IMPLANT
DEVICE RAD COMP TR BAND LRG (VASCULAR PRODUCTS) IMPLANT
ELECT DEFIB PAD ADLT CADENCE (PAD) IMPLANT
GLIDESHEATH SLEND SS 6F .021 (SHEATH) IMPLANT
GUIDEWIRE INQWIRE 1.5J.035X260 (WIRE) IMPLANT
INQWIRE 1.5J .035X260CM (WIRE) ×1 IMPLANT
PACK CARDIAC CATHETERIZATION (CUSTOM PROCEDURE TRAY) ×1 IMPLANT
SET ATX-X65L (MISCELLANEOUS) IMPLANT

## 2024-03-26 NOTE — Care Management CC44 (Signed)
 Condition Code 44 Documentation Completed  Patient Details  Name: Trip Cavanagh MRN: [id number] Date of Birth: [birth year]   Condition Code 44 given:  Yes Patient signature on Condition Code 44 notice:  Yes Documentation of 2 MD's agreement:  Yes Code 44 added to claim:  Yes    Jennett Model, RN 03/26/2024, 4:14 PM

## 2024-03-26 NOTE — Telephone Encounter (Signed)
   Transition of Care Follow-up Phone Call Request    Patient Name: Timothy Grant Date of Birth: Jun 27, 1946 Date of Encounter: 03/26/2024  Primary Care Provider:  Luevenia Saha, MD Primary Cardiologist:  None  [name] has been scheduled for a transition of care follow up appointment with a [facility] provider:  Tuesday Apr 10, 2024 with Marlana Silvan NP (slightly outside the 14 day window but still requesting call)  Please reach out to [name] within 48 hours of discharge to confirm appointment and review transition of care protocol questionnaire.  Anticipated discharge date: later today  [name] N Kaj Vasil, PA-C  03/26/2024, 1:52 PM

## 2024-03-26 NOTE — Progress Notes (Signed)
 Reviewed cath films with Dr End. Single vessel disease involving a diagonal.   LV function looks good on Echo.   Plan initial medical therapy. HR too slow for beta blocker. Will add amlodipine  and Imdur . (Willing to stop Cialis )  Reports prior rhabdo on higher crestor  dose in past. Will continue 10 mg daily and add Zetia . If not at goal consider Repatha or Leqvio  Will ambulate with cardiac Rehab today. If ok will DC later today  Timothy Gerdeman [place] MD, [facility]

## 2024-03-26 NOTE — Progress Notes (Signed)
 Echocardiogram 2D Echocardiogram has been performed.  Timothy Grant 03/26/2024, 11:44 AM

## 2024-03-26 NOTE — Care Management Obs Status (Signed)
 MEDICARE OBSERVATION STATUS NOTIFICATION   Patient Details  Name: Timothy Grant MRN: [id number] Date of Birth: 24-Apr-1946   Medicare Observation Status Notification Given:  Yes    Jennett Model, RN 03/26/2024, 4:14 PM

## 2024-03-26 NOTE — TOC Transition Note (Signed)
 Transition of Care [facility]) - Discharge Note   Patient Details  Name: Timothy Grant MRN: [id number] Date of Birth: Oct 06, 1946  Transition of Care [facility] CM/SW Contact:  Jennett Model, RN Phone Number: 03/26/2024, 4:07 PM   Clinical Narrative:    For dc today, has no needs.         Patient Goals and CMS Choice            Discharge Placement                       Discharge Plan and Services Additional resources added to the After Visit Summary for                                       Social Drivers of Health (SDOH) Interventions SDOH Screenings   Food Insecurity: No Food Insecurity (03/25/2024)  Housing: Low Risk  (03/25/2024)  Transportation Needs: No Transportation Needs (03/25/2024)  Utilities: Not At Risk (03/25/2024)  Alcohol Screen: Low Risk  (05/15/2023)  Depression (PHQ2-9): Low Risk  (02/09/2024)  Financial Resource Strain: Low Risk  (05/15/2023)  Physical Activity: Sufficiently Active (05/15/2023)  Social Connections: Moderately Isolated (03/25/2024)  Stress: No Stress Concern Present (05/15/2023)  Tobacco Use: Medium Risk (03/24/2024)     Readmission Risk Interventions    03/26/2024    4:05 PM  Readmission Risk Prevention Plan  Post Dischage Appt Complete  Medication Screening Complete  Transportation Screening Complete

## 2024-03-26 NOTE — Discharge Instructions (Signed)

## 2024-03-26 NOTE — Progress Notes (Signed)
 CARDIAC REHAB PHASE I     Pt resting in bed, eating lunch. Feeling well post cath, TR band remains in place. Will ask RN to ambulate to assess tolerance once bedrest is completed. Post MI education including restrictions, risk factors, exercise guidelines, antiplatelet therapy importance, MI booklet, heart healthy diet and CRP2 reviewed. All questions and concerns addressed. Will refer to [facility] for CRP2. Possible discharge home later today.  [id number] Ronny Colas, RN BSN 03/26/2024 1:03 PM

## 2024-03-26 NOTE — TOC CM/SW Note (Signed)
 Transition of Care [facility]) - Inpatient Brief Assessment   Patient Details  Name: Timothy Grant MRN: [id number] Date of Birth: 09/23/1946  [facility]) CM/SW Contact:    Jennett Model, RN Phone Number: 03/26/2024, 4:06 PM   Clinical Narrative: From home with spouse, has PCP and insurance on file, states has no HH services in place at this time or DME at home.  States family member will transport them home at [employer] and family is support system, states gets medications from [facility] on [address].  Pta self ambulatory.   Transition of Care Asessment: Insurance and Status: Insurance coverage has been reviewed Patient has primary care physician: Yes Home environment has been reviewed: home with wife Prior level of function:: indep Prior/Current Home Services: No current home services Social Drivers of Health Review: SDOH reviewed no interventions necessary Readmission risk has been reviewed: Yes Transition of care needs: no transition of care needs at this time

## 2024-03-26 NOTE — Discharge Summary (Addendum)
 Discharge Summary    Patient ID: Timothy Grant MRN: [id number]; DOB: 11-10-1946  Admit date: 03/24/2024 Discharge date: 03/26/2024  PCP:  Luevenia Saha, MD   [facility] Providers Cardiologist:  Peter Swaziland, MD        Discharge Diagnoses    Principal Problem:   NSTEMI (non-ST elevated myocardial infarction) [facility]) Active Problems:   Mixed hyperlipidemia   Pancytopenia (HCC)   Dilation of aorta (HCC)   Elevated blood pressure reading without diagnosis of hypertension   Mild aortic insufficiency    Diagnostic Studies/Procedures    2D echo 03/26/24  1. Left ventricular ejection fraction, by estimation, is 55 to 60%. The  left ventricle has normal function. The left ventricle demonstrates  regional wall motion abnormalities with mild anterior and anterolateral  hypokinesis. Left ventricular diastolic  parameters are consistent with Grade I diastolic dysfunction (impaired  relaxation).   2. Right ventricular systolic function is normal. The right ventricular  size is normal. There is normal pulmonary artery systolic pressure. The  estimated right ventricular systolic pressure is 21.1 mmHg.   3. Left atrial size was mildly dilated.   4. The mitral valve is normal in structure. No evidence of mitral valve  regurgitation. No evidence of mitral stenosis.   5. The aortic valve is tricuspid. There is mild calcification of the  aortic valve. Aortic valve regurgitation is mild. No aortic stenosis is  present.   6. Aortic dilatation noted. There is mild dilatation of the aortic root,  measuring 40 mm. There is mild dilatation of the ascending aorta,  measuring 43 mm.   7. The inferior vena cava is normal in size with greater than 50%  respiratory variability, suggesting right atrial pressure of 3 mmHg.   Cath 03/26/24 Significant single-vessel coronary artery disease with sequential 50% and 90% stenoses involving moderate-caliber D1 branch.  There is also mild  plaquing of the mid LAD and 50% stenosis of the distal RCA extending into RPAV branch. Normal left ventricular systolic function (LVEF 55-65%) with mildly elevated filling pressure (LVEDP 20 mmHg).   Conditions: Favor medical therapy including up to 12 months of dual antiplatelet therapy with aspirin  and clopidogrel , as tolerated.  Continue amlodipine  for antianginal therapy.  Consider adding ranolazine if chest pain recurs, given baseline low resting heart rate and long-term use of tadalafil , precluding addition of a beta-blocker and long-acting nitrate at this time. If the patient has recurrent angina refractory to medical therapy, PCI to D1 should be considered. Aggressive secondary prevention of coronary artery disease; increase rosuvastatin  to 20 mg daily. Follow-up echocardiogram.   Sammy Crisp, MD [facility]   _____________   History of Present Illness     Timothy Grant is a 78 y.o. male with hypercholesteremia, compensated cirrhosis secondary to prior acute hepatitis A, thoracic aorta aneurysm, chronic pancytopenia, hypothyroidism, remote history of cigarette use, and erectile dysfunction who presented to the ED late the evening of 4/19 with chest pain. The day prior he had developed chest pain/pressure described as squeezing/non-radiating after mowing his lawn, relieved with resting after 10-15 minutes. He was able to go about his normal day without recurrence. He had recurrence of symptoms the day of presentation to the ER, prompting him to seek care. In the emergency department, initial ECG demonstrated non-specific ST changes.  Initial HS-troponin was 982 ng/L.  He was given high-dose aspirin  and started on a heparin  drip prior to admission.    Hospital Course     1. NSTEMI/CAD -  hsTroponins peaked at 5,412 - underwent cath today with sequential 50% and 90% stenoses involving moderate-caliber D1 branch, as well as mild plaquing of the mid LAD and 50% stenosis of the  distal RCA extending into RPAV branch, normal left ventricular systolic function (LVEF 55-65%) with mildly elevated filling pressure (LVEDP 20 mmHg) - echo today showed EF 55-60%, + RWMA with mild anterior and anterolateral hypokinesis, G1DD, mildly dilated LA, mild AI, mild dilation of aortic root/ascending aorta - Dr. Swaziland discussed case with Dr. Nolan Battle, plan to initiate medical therapy. HR too slow for beta blocker. Amlodipine  and Imdur  added (patient willing to stop Cialis ) - he reported rhabdomyolysis on higher Crestor  dose in the past - Dr. Swaziland recommended to continue 10mg  daily and add Zetia . If not at goal in follow-up, would need to consider Repatha or Leqvio - patient ambulated well post cath without any angina or dyspnea  2. Hyperlipidemia - lipid management as above  3. Dilatation of aortic root and ascending aorta in 2020 on echocardiogram - repeat echo showed mild dilation of aortic root (40mm) and ascending aorta (43mm) - continue outpatient follow-up   4. Pancytopenia, stable - follows with hematology, no active bleeding. Follow.  5. Elevated blood pressure without diagnosis of HTN  - managed in context above.  6. Mild AI - follow clinically, consider repeat echo 3-5 years  Dr. Swaziland has seen and examined the patient today and feels he is stable for discharge. TOC f/u arranged, slightly outside of window due to appt availability but will still launch phone call to check on patient within 48 hours. Also indicated location change on AVS.      Did the patient have an acute coronary syndrome (MI, NSTEMI, STEMI, etc) this admission?:  Yes                               AHA/ACC ACS Clinical Performance & Quality Measures: Aspirin  prescribed? - Yes ADP Receptor Inhibitor (Plavix /Clopidogrel , Brilinta/Ticagrelor or Effient/Prasugrel) prescribed (includes medically managed patients)? - Yes Beta Blocker prescribed? - No - baseline bradycardia High Intensity Statin (Lipitor  40-80mg  or Crestor  20-40mg ) prescribed? - No - history of rhabdomyolysis on higher dose rosuvastatin  EF assessed during THIS hospitalization? - Yes For EF <40%, was ACEI/ARB prescribed? - Not Applicable (EF >/= 40%) For EF <40%, Aldosterone Antagonist (Spironolactone  or Eplerenone) prescribed? - Not Applicable (EF >/= 40%) Cardiac Rehab Phase II ordered (including medically managed patients)? - Yes      _____________  Discharge Vitals Blood pressure 116/82, pulse (!) 58, temperature 97.8 F (36.6 C), temperature source Oral, resp. rate 19, height 6\' 1"  (1.854 m), weight 103.9 kg, SpO2 100%.  Filed Weights   03/24/24 2351 03/26/24 0432  Weight: 105.7 kg 103.9 kg    Labs & Radiologic Studies    CBC Recent Labs    03/26/24 0331 03/26/24 0832  WBC 4.2 4.0  HGB 12.3* 12.4*  HCT 36.4* 36.2*  MCV 97.1 97.1  PLT 115* 105*   Basic Metabolic Panel Recent Labs    16/10/96 0331 03/26/24 0832  NA 135 136  K 4.0 4.3  CL 103 103  CO2 22 24  GLUCOSE 105* 108*  BUN 12 10  CREATININE 1.11 1.01  CALCIUM  9.2 9.2   High Sensitivity Troponin:   Recent Labs  Lab 03/25/24 0005 03/25/24 0155 03/25/24 1326 03/25/24 1529  TROPONINIHS 982* 1,128* 5,412* 5,382*    Fasting Lipid Panel Recent Labs  03/25/24 0806  CHOL 144  HDL 52  LDLCALC 85  TRIG 37  CHOLHDL 2.8    _____________  ECHOCARDIOGRAM COMPLETE Result Date: 03/26/2024    ECHOCARDIOGRAM REPORT   Patient Name:   KACY CONELY Date of Exam: 03/26/2024 Medical Rec #:  [id number]         Height:       73.0 in Accession #:    [id number]        Weight:       229.0 lb Date of Birth:  05/11/1946         BSA:          2.279 m Patient Age:    77 years          BP:           122/74 mmHg Patient Gender: M                 HR:           55 bpm. Exam Location:  Inpatient Procedure: 2D Echo, Cardiac Doppler and Color Doppler (Both Spectral and Color            Flow Doppler were utilized during procedure). Indications:    NSTEMI I21.4   History:        Patient has prior history of Echocardiogram examinations, most                 recent 06/25/2019. Previous Myocardial Infarction; Risk                 Factors:Dyslipidemia.  Sonographer:    Terrilee Few RCS Referring Phys: [id number] KAMAL H HENDERSON IMPRESSIONS  1. Left ventricular ejection fraction, by estimation, is 55 to 60%. The left ventricle has normal function. The left ventricle demonstrates regional wall motion abnormalities with mild anterior and anterolateral hypokinesis. Left ventricular diastolic parameters are consistent with Grade I diastolic dysfunction (impaired relaxation).  2. Right ventricular systolic function is normal. The right ventricular size is normal. There is normal pulmonary artery systolic pressure. The estimated right ventricular systolic pressure is 21.1 mmHg.  3. Left atrial size was mildly dilated.  4. The mitral valve is normal in structure. No evidence of mitral valve regurgitation. No evidence of mitral stenosis.  5. The aortic valve is tricuspid. There is mild calcification of the aortic valve. Aortic valve regurgitation is mild. No aortic stenosis is present.  6. Aortic dilatation noted. There is mild dilatation of the aortic root, measuring 40 mm. There is mild dilatation of the ascending aorta, measuring 43 mm.  7. The inferior vena cava is normal in size with greater than 50% respiratory variability, suggesting right atrial pressure of 3 mmHg. FINDINGS  Left Ventricle: Left ventricular ejection fraction, by estimation, is 55 to 60%. The left ventricle has normal function. The left ventricle demonstrates regional wall motion abnormalities. The left ventricular internal cavity size was normal in size. There is no left ventricular hypertrophy. Left ventricular diastolic parameters are consistent with Grade I diastolic dysfunction (impaired relaxation). Right Ventricle: The right ventricular size is normal. No increase in right ventricular wall thickness.  Right ventricular systolic function is normal. There is normal pulmonary artery systolic pressure. The tricuspid regurgitant velocity is 2.13 m/s, and  with an assumed right atrial pressure of 3 mmHg, the estimated right ventricular systolic pressure is 21.1 mmHg. Left Atrium: Left atrial size was mildly dilated. Right Atrium: Right atrial size was normal in size. Pericardium: There is no evidence of pericardial  effusion. Mitral Valve: The mitral valve is normal in structure. No evidence of mitral valve regurgitation. No evidence of mitral valve stenosis. Tricuspid Valve: The tricuspid valve is normal in structure. Tricuspid valve regurgitation is trivial. Aortic Valve: The aortic valve is tricuspid. There is mild calcification of the aortic valve. Aortic valve regurgitation is mild. No aortic stenosis is present. Aortic valve peak gradient measures 7.6 mmHg. Pulmonic Valve: The pulmonic valve was normal in structure. Pulmonic valve regurgitation is not visualized. Aorta: Aortic dilatation noted. There is mild dilatation of the aortic root, measuring 40 mm. There is mild dilatation of the ascending aorta, measuring 43 mm. Venous: The inferior vena cava is normal in size with greater than 50% respiratory variability, suggesting right atrial pressure of 3 mmHg. IAS/Shunts: No atrial level shunt detected by color flow Doppler.  LEFT VENTRICLE PLAX 2D LVIDd:         4.70 cm   Diastology LVIDs:         3.00 cm   LV e' medial:    9.03 cm/s LV PW:         1.00 cm   LV E/e' medial:  9.2 LV IVS:        1.20 cm   LV e' lateral:   8.49 cm/s LVOT diam:     2.20 cm   LV E/e' lateral: 9.8 LV SV:         87 LV SV Index:   38 LVOT Area:     3.80 cm  RIGHT VENTRICLE             IVC RV S prime:     12.70 cm/s  IVC diam: 1.10 cm TAPSE (M-mode): 1.8 cm LEFT ATRIUM             Index        RIGHT ATRIUM           Index LA diam:        4.40 cm 1.93 cm/m   RA Area:     16.00 cm LA Vol (A2C):   68.7 ml 30.14 ml/m  RA Volume:   33.80 ml   14.83 ml/m LA Vol (A4C):   77.3 ml 33.92 ml/m LA Biplane Vol: 82.3 ml 36.11 ml/m  AORTIC VALVE AV Area (Vmax): 2.58 cm AV Vmax:        138.00 cm/s AV Peak Grad:   7.6 mmHg LVOT Vmax:      93.60 cm/s LVOT Vmean:     60.800 cm/s LVOT VTI:       0.230 m  AORTA Ao Root diam: 4.00 cm Ao Asc diam:  4.30 cm MITRAL VALVE                TRICUSPID VALVE MV Area (PHT): 2.13 cm     TR Peak grad:   18.1 mmHg MV Decel Time: 356 msec     TR Vmax:        213.00 cm/s MV E velocity: 83.10 cm/s MV A velocity: 119.00 cm/s  SHUNTS MV E/A ratio:  0.70         Systemic VTI:  0.23 m                             Systemic Diam: 2.20 cm Dalton McleanMD Electronically signed by Archer Bear Signature Date/Time: 03/26/2024/11:59:28 AM    Final    CARDIAC CATHETERIZATION Result Date: 03/26/2024 Conclusions: Significant single-vessel coronary artery disease with  sequential 50% and 90% stenoses involving moderate-caliber D1 branch.  There is also mild plaquing of the mid LAD and 50% stenosis of the distal RCA extending into RPAV branch. Normal left ventricular systolic function (LVEF 55-65%) with mildly elevated filling pressure (LVEDP 20 mmHg). Conditions: Favor medical therapy including up to 12 months of dual antiplatelet therapy with aspirin  and clopidogrel , as tolerated.  Continue amlodipine  for antianginal therapy.  Consider adding ranolazine if chest pain recurs, given baseline low resting heart rate and long-term use of tadalafil , precluding addition of a beta-blocker and long-acting nitrate at this time. If the patient has recurrent angina refractory to medical therapy, PCI to D1 should be considered. Aggressive secondary prevention of coronary artery disease; increase rosuvastatin  to 20 mg daily. Follow-up echocardiogram. Sammy Crisp, MD [facility]  Disposition   Pt is being discharged home today in good condition.  Follow-up Plans & Appointments     Follow-up Information     Marlana Silvan, NP Follow up.   Why:  ** IMPORTANT: disregard [facility] address listed below. YOUR APPOINTMENT IS AT [address] **  You have a follow-up appointment with a provider at [facility] in [place] on Tuesday Apr 10, 2024 at 8:50 AM (Arrive 15 minutes early to check in).    We are currently in the process of transitioning from two locations to one.  Effective April 02, 2024, all appointments that were previously scheduled at either our [facility] or [facility] locations will be moved to our new location at [address], [place], [place], [address]. Contact information: [facility] [address] [phone]               Discharge Instructions     Amb Referral to Cardiac Rehabilitation   Complete by: As directed    Diagnosis: NSTEMI   After initial evaluation and assessments completed: Virtual Based Care may be provided alone or in conjunction with Phase 2 Cardiac Rehab based on patient barriers.: Yes   Intensive Cardiac Rehabilitation (ICR) [facility] location only OR Traditional Cardiac Rehabilitation (TCR) *If criteria for ICR are not met will enroll in TCR [facility] only): Yes   Diet - low sodium heart healthy   Complete by: As directed    Increase activity slowly   Complete by: As directed    No driving for 1 week. No lifting over 10 lbs for 2 weeks. No sexual activity for 2 weeks. Keep procedure site clean & dry. If you notice increased pain, swelling, bleeding or pus, call/return!  You may shower, but no soaking baths/hot tubs/pools for 1 week.        Discharge Medications   Allergies as of 03/26/2024   No Known Allergies      Medication List     STOP taking these medications    tadalafil  5 MG tablet Commonly known as: CIALIS        TAKE these medications    acetaminophen  500 MG tablet Commonly known as: TYLENOL  Take 1,000 mg by mouth daily as needed for headache.   amLODipine  2.5 MG tablet Commonly known as: NORVASC  Take 1 tablet (2.5 mg total) by mouth  daily. Start taking on: March 27, 2024   aspirin  EC 81 MG tablet Take 1 tablet (81 mg total) by mouth daily. Swallow whole. Start taking on: March 27, 2024   Cetirizine HCl 10 MG Caps Take 10 mg by mouth daily.   clopidogrel  75 MG tablet Commonly known as: PLAVIX  Take 1 tablet (75 mg total) by  mouth daily.   cyanocobalamin  1000 MCG tablet Commonly known as: VITAMIN B12 Take 1,000 mcg by mouth daily.   cyclobenzaprine  10 MG tablet Commonly known as: FLEXERIL  Take 1 tablet (10 mg total) by mouth 3 (three) times daily as needed for muscle spasms. What changed: when to take this   ezetimibe  10 MG tablet Commonly known as: ZETIA  Take 1 tablet (10 mg total) by mouth daily. Start taking on: March 27, 2024   Fish Oil 1200 MG Caps Take 1,200 mg by mouth daily.   folic acid  1 MG tablet Commonly known as: FOLVITE  TAKE 1 TABLET BY MOUTH DAILY   Glucosamine HCl 1000 MG Tabs Take 1 tablet by mouth daily.   isosorbide  mononitrate 30 MG 24 hr tablet Commonly known as: IMDUR  Take 1 tablet (30 mg total) by mouth daily. Start taking on: March 27, 2024   levothyroxine  137 MCG tablet Commonly known as: SYNTHROID  Take 1 tablet by mouth daily on Monday, Wednesday, Friday.   levothyroxine  150 MCG tablet Commonly known as: SYNTHROID  TAKE 1 TABLET BY MOUTH ON TUESDAY, THURSDAY, SATURDAY,AND SUNDAY   multivitamin capsule Take 1 capsule by mouth daily. Centrum Senior   nitroGLYCERIN  0.4 MG SL tablet Commonly known as: NITROSTAT  Place 1 tablet (0.4 mg total) under the tongue every 5 (five) minutes as needed for chest pain (up to 3 doses).   rosuvastatin  10 MG tablet Commonly known as: CRESTOR  TAKE 1 TABLET BY MOUTH DAILY What changed: when to take this   Vitamin D3 125 MCG (5000 UT) Caps Take 5,000 Units by mouth daily.           Outstanding Labs/Studies   N/A  Duration of Discharge Encounter: APP Time: 15 minutes   Signed, Genova Kiner N Katanya Schlie, PA-C 03/26/2024, 3:06  PM

## 2024-03-26 NOTE — Interval H&P Note (Signed)
 History and Physical Interval Note:  03/26/2024 9:10 AM  Timothy Grant  has presented today for surgery, with the diagnosis of NSTEMI.  The various methods of treatment have been discussed with the patient and family. After consideration of risks, benefits and other options for treatment, the patient has consented to  Procedure(s): LEFT HEART CATH AND CORONARY ANGIOGRAPHY (N/A) as a surgical intervention.  The patient's history has been reviewed, patient examined, no change in status, stable for surgery.  I have reviewed the patient's chart and labs.  Questions were answered to the patient's satisfaction.    Cath Lab Visit (complete for each Cath Lab visit)  Clinical Evaluation Leading to the Procedure:   ACS: Yes.    Non-ACS:  N/A  [name]

## 2024-03-27 LAB — LIPOPROTEIN A (LPA): Lipoprotein (a): 111.8 nmol/L — ABNORMAL HIGH (ref <75.0)

## 2024-03-27 NOTE — Telephone Encounter (Signed)
 Attempted to call patient, no answer left message requesting a call back.

## 2024-03-28 NOTE — Telephone Encounter (Signed)
 Patient was returning call. Please advise ?

## 2024-03-28 NOTE — Telephone Encounter (Signed)
 2nd attempt to call patient, no answer left message requesting a call back.

## 2024-03-28 NOTE — Telephone Encounter (Signed)
 Patient identification verified by 2 forms. Hilton Lucky, RN    Patient contacted regarding discharge from [facility] on 03/26/24.  Patient understands to follow up with provider Np Monge on 5/6 at 8:50 AM at [facility]. Patient understands discharge instructions? Yes Patient understands medications and regiment? Yes Patient understands to bring all medications to this visit? Yes  MyChart message sent to patient regarding 5/6 OV

## 2024-03-29 ENCOUNTER — Other Ambulatory Visit (HOSPITAL_COMMUNITY): Payer: Self-pay

## 2024-03-30 ENCOUNTER — Ambulatory Visit: Payer: Medicare Other | Admitting: Internal Medicine

## 2024-03-30 ENCOUNTER — Ambulatory Visit: Admitting: Nurse Practitioner

## 2024-03-30 ENCOUNTER — Encounter: Payer: Self-pay | Admitting: Nurse Practitioner

## 2024-03-30 ENCOUNTER — Telehealth: Payer: Self-pay | Admitting: Cardiology

## 2024-03-30 VITALS — BP 110/62 | HR 64 | Ht 72.0 in | Wt 228.0 lb

## 2024-03-30 DIAGNOSIS — K7460 Unspecified cirrhosis of liver: Secondary | ICD-10-CM

## 2024-03-30 DIAGNOSIS — Z7902 Long term (current) use of antithrombotics/antiplatelets: Secondary | ICD-10-CM

## 2024-03-30 DIAGNOSIS — D696 Thrombocytopenia, unspecified: Secondary | ICD-10-CM

## 2024-03-30 DIAGNOSIS — Z87891 Personal history of nicotine dependence: Secondary | ICD-10-CM

## 2024-03-30 DIAGNOSIS — I25810 Atherosclerosis of coronary artery bypass graft(s) without angina pectoris: Secondary | ICD-10-CM | POA: Diagnosis not present

## 2024-03-30 DIAGNOSIS — D649 Anemia, unspecified: Secondary | ICD-10-CM

## 2024-03-30 NOTE — Progress Notes (Signed)
 03/30/2024 Timothy Grant [id number] Dec 30, 1945   Chief Complaint: Cirrhosis follow up  History of Present Illness: Timothy Grant is a 78 year old male with a past medical history of hypercholesterolemia, rhabdomyolysis on Crestor , aortic root and ascending aorta dilatation, hypothyroidism, cirrhosis and thrombocytopenia who was recently admitted to the hospital 4/19 - 03/26/2024 with a NSTEMI. He is known by Dr. Elvin Hammer.  He was seen in office by Santina Cull 10/04/2023 for cirrhosis follow-up.  At that time, his labs were stable with a MELD 3.0 score of 11.  He was scheduled for an EGD to survey for esophageal varices which was completed on 11/10/2023 which was normal, no evidence of esophageal or gastric varices.  He developed mid chest pressure for few days and presented to the ED 03/24/2024 diagnosed with a NSTEMI.  He underwent a 2D  ECHO 03/26/2024 which showed LVEF 55 to 60% with grade 1 diastolic dysfunction.  Cardiac catheterization performed 03/26/2024 showed significant single-vessel CAD with sequential 50% and 90% stenoses in the D1 branch, mild plaquing to the mid LAD with 50% stenosis of the distal RCA.  He was discharged home on ASA, Plavix  and Rosuvastatin  20 mg daily.  He presents today for routine cirrhosis follow-up.  He denies having any further chest pain since he was discharged from the hospital. He denies having any N/V. No upper or lower abdominal pain. He is passing a normal brown formed stools once daily. No bloody or black stools. No confusion. No significant pruritus, he has mild itchiness due to patches of dry skin. Past smoker, quit 1975.  He drinks 1 to 3 beers weekly and 1 or 2 mixed drinks weekly.  No alcohol intake since he was discharged from the hospital.  No NSAID use.  He is scheduled to see his cardiologist next month and intends to initiate cardiac rehab following that appointment.      Latest Ref Rng & Units 03/26/2024    8:32 AM 03/26/2024    3:31 AM  03/25/2024    8:30 AM  CBC  WBC 4.0 - 10.5 K/uL 4.0  4.2  3.9   Hemoglobin 13.0 - 17.0 g/dL 40.9  81.1  91.4   Hematocrit 39.0 - 52.0 % 36.2  36.4  36.5   Platelets 150 - 400 K/uL 105  115  117        Latest Ref Rng & Units 03/26/2024    8:32 AM 03/26/2024    3:31 AM 03/25/2024    8:30 AM  CMP  Glucose 70 - 99 mg/dL 782  956  213   BUN 8 - 23 mg/dL 10  12  13    Creatinine 0.61 - 1.24 mg/dL 0.86  5.78  4.69   Sodium 135 - 145 mmol/L 136  135  136   Potassium 3.5 - 5.1 mmol/L 4.3  4.0  4.0   Chloride 98 - 111 mmol/L 103  103  102   CO2 22 - 32 mmol/L 24  22  23    Calcium  8.9 - 10.3 mg/dL 9.2  9.2  9.2        Latest Ref Rng & Units 03/16/2024   10:35 AM 02/02/2024    9:46 AM 11/17/2023   11:00 AM  Hepatic Function  Total Protein 6.5 - 8.1 g/dL 7.1  7.4  7.4   Albumin 3.5 - 5.0 g/dL 4.5  4.2  4.2   AST 15 - 41 U/L 29  32  30   ALT 0 - 44  U/L 27  37  34   Alk Phosphatase 38 - 126 U/L 86  95  76   Total Bilirubin 0.0 - 1.2 mg/dL 0.6  0.6  0.7     MELD 3.0: 11 at 10/04/2023 12:29 PM MELD-Na: 7 at 10/04/2023 12:29 PM Calculated from: Serum Creatinine: 1.04 mg/dL at 40/98/1191 47:82 PM Serum Sodium: 133 mEq/L at 10/04/2023 12:29 PM Total Bilirubin: 0.7 mg/dL (Using min of 1 mg/dL) at 95/62/1308 65:78 PM Serum Albumin: 4.4 g/dL (Using max of 3.5 g/dL) at 46/96/2952 84:13 PM INR(ratio): 1.1 ratio at 10/04/2023 12:29 PM Age at listing (hypothetical): 89 years Sex: Male at 10/04/2023 12:29 PM  Labs 10/04/2023: AFP 4.1.  ANA positive.  SMA less than 20.  AMA less than 20.  Hep C antibody nonreactive.  Hep B surface antigen nonreactive.  B surface antibody nonreactive.  IgG 981.  CARDIAC STUDIES:  2D echo 03/26/24  1. Left ventricular ejection fraction, by estimation, is 55 to 60%. The  left ventricle has normal function. The left ventricle demonstrates  regional wall motion abnormalities with mild anterior and anterolateral  hypokinesis. Left ventricular diastolic  parameters are  consistent with Grade I diastolic dysfunction (impaired  relaxation).   2. Right ventricular systolic function is normal. The right ventricular  size is normal. There is normal pulmonary artery systolic pressure. The  estimated right ventricular systolic pressure is 21.1 mmHg.   3. Left atrial size was mildly dilated.   4. The mitral valve is normal in structure. No evidence of mitral valve  regurgitation. No evidence of mitral stenosis.   5. The aortic valve is tricuspid. There is mild calcification of the  aortic valve. Aortic valve regurgitation is mild. No aortic stenosis is  present.   6. Aortic dilatation noted. There is mild dilatation of the aortic root,  measuring 40 mm. There is mild dilatation of the ascending aorta,  measuring 43 mm.   7. The inferior vena cava is normal in size with greater than 50%  respiratory variability, suggesting right atrial pressure of 3 mmHg.    Cath 03/26/24 Significant single-vessel coronary artery disease with sequential 50% and 90% stenoses involving moderate-caliber D1 branch.  There is also mild plaquing of the mid LAD and 50% stenosis of the distal RCA extending into RPAV branch. Normal left ventricular systolic function (LVEF 55-65%) with mildly elevated filling pressure (LVEDP 20 mmHg).  IMAGE STUDIES:   05/21/2020 elastography by Dr. Savannah Curlin for elevated LFTs showed small gallstone diffuse coarsening hepatic echotexture suspicious for diffuse hepatocellular disease median K PA 6.4 11/07/2020 patient had abdominal ultrasound by Dr. Savannah Curlin for elevated liver enzymes that showed hepatic steatosis without focal mass, he also had gallbladder polyps so was referred to surgery, spleen unremarkable 08/25/2023 right upper quadrant ultrasound by hematology for hepatitis A showed status post cholecystectomy but coarsened echogenicity and nodular contour of the liver with no lesions.  PAST GI PROCEDURES:   EGD 11/10/2023: - Normal esophagus.  - Normal  stomach.  - Normal examined duodenum. -  No specimens collected.  Colonoscopy 05/16/2023: - Diverticulosis in the left colon and in the right colon.  - The examination was otherwise normal on direct and retroflexion views.  - No specimens collected.  Current Outpatient Medications on File Prior to Visit  Medication Sig Dispense Refill   acetaminophen  (TYLENOL ) 500 MG tablet Take 1,000 mg by mouth daily as needed for headache.     amLODipine  (NORVASC ) 2.5 MG tablet Take 1 tablet (2.5 mg total) by  mouth daily. 30 tablet 5   aspirin  EC 81 MG tablet Take 1 tablet (81 mg total) by mouth daily. Swallow whole. 30 tablet 11   Cetirizine HCl 10 MG CAPS Take 10 mg by mouth daily.     Cholecalciferol (VITAMIN D3) 125 MCG (5000 UT) CAPS Take 5,000 Units by mouth daily.     clopidogrel  (PLAVIX ) 75 MG tablet Take 1 tablet (75 mg total) by mouth daily. 30 tablet 11   cyanocobalamin  (VITAMIN B12) 1000 MCG tablet Take 1,000 mcg by mouth daily.     cyclobenzaprine  (FLEXERIL ) 10 MG tablet Take 1 tablet (10 mg total) by mouth 3 (three) times daily as needed for muscle spasms. (Patient taking differently: Take 10 mg by mouth at bedtime as needed for muscle spasms.) 90 tablet 3   ezetimibe  (ZETIA ) 10 MG tablet Take 1 tablet (10 mg total) by mouth daily. 30 tablet 5   folic acid  (FOLVITE ) 1 MG tablet TAKE 1 TABLET BY MOUTH DAILY 90 tablet 1   Glucosamine HCl 1000 MG TABS Take 1 tablet by mouth daily.     isosorbide  mononitrate (IMDUR ) 30 MG 24 hr tablet Take 1 tablet (30 mg total) by mouth daily. 30 tablet 5   levothyroxine  (SYNTHROID ) 137 MCG tablet Take 1 tablet by mouth daily on Monday, Wednesday, Friday. 90 tablet 3   levothyroxine  (SYNTHROID ) 150 MCG tablet TAKE 1 TABLET BY MOUTH ON TUESDAY, THURSDAY, SATURDAY,AND SUNDAY 90 tablet 3   Multiple Vitamin (MULTIVITAMIN) capsule Take 1 capsule by mouth daily. Centrum Senior     Omega-3 Fatty Acids (FISH OIL) 1200 MG CAPS Take 1,200 mg by mouth daily.      rosuvastatin  (CRESTOR ) 10 MG tablet TAKE 1 TABLET BY MOUTH DAILY (Patient taking differently: Take 10 mg by mouth at bedtime.) 90 tablet 3   nitroGLYCERIN  (NITROSTAT ) 0.4 MG SL tablet Place 1 tablet (0.4 mg total) under the tongue every 5 (five) minutes as needed for chest pain (up to 3 doses). (Patient not taking: Reported on 03/30/2024) 25 tablet 3   No current facility-administered medications on file prior to visit.   No Known Allergies  Current Medications, Allergies, Past Medical History, Past Surgical History, Family History and Social History were reviewed in [employer] record.  Review of Systems:   Constitutional: Negative for fever, sweats, chills or weight loss.  Respiratory: Negative for shortness of breath.   Cardiovascular: See HPI. Gastrointestinal: See HPI.  Musculoskeletal: Negative for back pain or muscle aches.  Neurological: Negative for dizziness, headaches or paresthesias.   Physical Exam: BP 110/62 (BP Location: Left Arm, Patient Position: Sitting)   Pulse 64 Comment: irregular  Ht 6' (1.829 m) Comment: height measured without shoes  Wt 228 lb (103.4 kg)   BMI 30.92 kg/m   General: 78 year old male in no acute distress. Head: Normocephalic and atraumatic. Eyes: No scleral icterus. Conjunctiva pink . Ears: Normal auditory acuity. Mouth: Dentition intact. No ulcers or lesions.  Lungs: Clear throughout to auscultation. Heart: Rhythm slightly irregular, no murmur. Abdomen: Soft, nontender, protuberant and nondistended. No masses or hepatomegaly.  No ascites.  Normal bowel sounds x 4 quadrants.  Rectal: Deferred.  Musculoskeletal: Symmetrical with no gross deformities. Extremities: No edema. Neurological: Alert oriented x 4. No focal deficits.  Psychological: Alert and cooperative. Normal mood and affect  Assessment and Recommendations:  78 year old male with prior relapsing hepatitis A with compensated cirrhosis. RUQ sonogram 02/2024  stable without hepatoma. No esophageal or gastric varices per EGD 11/2023. No overt  signs of hepatic encephalopathy.  Normal LFTs. MELD 3.0: 11 per labs 09/2023. - Repeat RUQ sonogram 08/2024 - Patient advised no alcohol use - AFP, will ask PCP to check with next lab draw scheduled 04/2024 - Continue ASA secondary to CAD, avoid other NSAIDs i.e. Ibuprofen/Naproxen - Consider hepatitis B vaccination at time of next follow-up  Coronary artery disease status post NSTEMI 03/24/2024.  On ASA and Plavix . - Patient to continue follow-up with cardiology - Patient instructed to go to the emergency room if he develops chest pain, shortness of breath or dizziness  Mild normocytic anemia.  No overt GI bleeding. - Repeat CBC with PCP with next lab draw scheduled 04/2024  Thrombocytopenia, secondary to cirrhosis.  Abdominal MRI 01/2019 and complete abdominal sonogram 11/2020 without splenomegaly.  His most recent colonoscopy 05/16/2023 showed diverticulosis, no polyps. - No further screening colonoscopies recommended due to age

## 2024-03-30 NOTE — Telephone Encounter (Signed)
 New Message:     Patient said the NP at [facility] today Patient said she said  he had some irregular rhythms going on She told him to contact the Cardiologist office and see  whether they wanted him to be seen before 04-10-24 or what they advise     Patient c/o Palpitations:  STAT if patient reporting lightheadedness, shortness of breath, or chest pain  How long have you had palpitations/irregular HR/ Afib? Are you having the symptoms now? Irregular rhythms  Are you currently experiencing lightheadedness, SOB or CP? no  Do you have a history of afib (atrial fibrillation) or irregular heart rhythm?   Have you checked your BP or HR? (document readings if available):  111/64  Are you experiencing any other symptoms? If he turn quickly while standing up, he feels lightheaded

## 2024-03-30 NOTE — Patient Instructions (Addendum)
 Follow up with Cardiology.  Follow up in 6 months.  AFP level to be done with primary care provider in June with your next scheduled lab draw.  Due to recent changes in healthcare laws, you may see the results of your imaging and laboratory studies on MyChart before your provider has had a chance to review them.  We understand that in some cases there may be results that are confusing or concerning to you. Not all laboratory results come back in the same time frame and the provider may be waiting for multiple results in order to interpret others.  Please give us  48 hours in order for your provider to thoroughly review all the results before contacting the office for clarification of your results.   Thank you for trusting me with your gastrointestinal care!   Everett Hitt, CRNP

## 2024-03-30 NOTE — Telephone Encounter (Signed)
 Spoke to patient he stated he saw PA with [facility] this afternoon.She noticed heart beat to be irregular.B/P 111/62 pulse 64.Stated she was told he might need to be seen sooner than 5/6.Stated he feels fine.He does not notice irregular heart beat only when he checks manuel pulse.Advised no sooner appointment available.Advised to keep appointment with Marlana Silvan 5/6 at 8:50 am. Advised to monitor pulse and B/P bring readings to appointment.Advised I will make Dr.Jordan aware.

## 2024-04-02 ENCOUNTER — Telehealth (HOSPITAL_COMMUNITY): Payer: Self-pay

## 2024-04-02 NOTE — Telephone Encounter (Signed)
 Pt insurance is active and benefits verified through [facility] Medicare. Co-pay $0.00, DED $0.00/$0.00 met, out of pocket $3,900.00/$123.24 met, co-insurance 0%. No pre-authorization required. Passport, 04/02/24 @ 12:09, REF#[id number]   How many CR sessions are covered? (36 visits for TCR, 72 visits for ICR)72 Is this a lifetime maximum or an annual maximum? Annual Has the member used any of these services to date? No Is there a time limit (weeks/months) on start of program and/or program completion? No     Will contact patient to see if he is interested in the Cardiac Rehab Program. If interested, patient will need to complete follow up appt. Once completed, patient will be contacted for scheduling upon review by the RN Navigator.

## 2024-04-03 NOTE — Telephone Encounter (Signed)
 Called patient to see if he is interested in the Cardiac Rehab Program. Patient expressed interest. Explained scheduling process and went over insurance, patient verbalized understanding. Will contact patient for scheduling once f/u has been completed.

## 2024-04-03 NOTE — Progress Notes (Signed)
 Noted.

## 2024-04-03 NOTE — Telephone Encounter (Signed)
 Pt insurance is active and benefits verified through [facility] Medicare. Co-pay $0.00, DED $0.00/$0.00 met, out of pocket $3,900.00/$123.24 met, co-insurance 0%. No pre-authorization required. Passport, 04/03/24 @ 2:21PM, REF#[id number]   How many CR sessions are covered? (36 visits for TCR, 72 visits for ICR)72 Is this a lifetime maximum or an annual maximum? Annual Has the member used any of these services to date? No Is there a time limit (weeks/months) on start of program and/or program completion? No     Will contact patient to see if he is interested in the Cardiac Rehab Program. If interested, patient will need to complete follow up appt. Once completed, patient will be contacted for scheduling upon review by the RN Navigator.

## 2024-04-10 ENCOUNTER — Ambulatory Visit: Admitting: Nurse Practitioner

## 2024-04-11 ENCOUNTER — Encounter: Payer: Self-pay | Admitting: Cardiology

## 2024-04-11 ENCOUNTER — Ambulatory Visit: Attending: Cardiology | Admitting: Cardiology

## 2024-04-11 VITALS — BP 110/60 | HR 58 | Ht 72.0 in | Wt 228.4 lb

## 2024-04-11 DIAGNOSIS — I77819 Aortic ectasia, unspecified site: Secondary | ICD-10-CM | POA: Diagnosis not present

## 2024-04-11 DIAGNOSIS — R030 Elevated blood-pressure reading, without diagnosis of hypertension: Secondary | ICD-10-CM

## 2024-04-11 DIAGNOSIS — I351 Nonrheumatic aortic (valve) insufficiency: Secondary | ICD-10-CM

## 2024-04-11 DIAGNOSIS — E782 Mixed hyperlipidemia: Secondary | ICD-10-CM | POA: Diagnosis not present

## 2024-04-11 DIAGNOSIS — I214 Non-ST elevation (NSTEMI) myocardial infarction: Secondary | ICD-10-CM | POA: Diagnosis not present

## 2024-04-11 NOTE — Progress Notes (Signed)
 Cardiology Office Note:   Date:  04/11/2024  ID:  Timothy Grant, DOB 10/29/1946, MRN [id number] PCP: Luevenia Saha, MD  [facility] Providers Cardiologist:  Peter Swaziland, MD    History of Present Illness:   Discussed the use of AI scribe software for clinical note transcription with the patient, who gave verbal consent to proceed.  History of Present Illness Timothy Grant "Timothy Grant" is a 78 year old male, retired Teacher, early years/pre, with coronary artery disease, hyperlipidemia, pancytopenia, thoracic aortic aneurysm, compensated cirrhosis secondary to prior acute hepatitis A who presents for follow-up after recent hospitalization for chest pain.  He presented to the emergency department on April 19th with chest pain described as a squeezing sensation. Pain initially occurred after mowing his lawn. The pain was relieved with rest after 10 to 15 minutes. He experienced a recurrence of symptoms the following day, prompting his visit to the emergency department. An EKG showed nonspecific ST segment changes, and his high sensitivity troponin was elevated at 982, peaking at 5412. He underwent cardiac catheterization revealing sequential 50% and 90% stenosis in the D1 artery, mild plaquing of the mid LAD, and 50% stenosis of the distal RCA extending into the RPAV branch. Medical management recommended with patient starting Plavix /ASA as well as Amlodipine  and Imdur  for angina.   Since discharge, he reports no recurrence of chest pain or tightness and has not needed nitroglycerin . He has resumed walking his pet half a mile twice daily without issues. He has not yet resumed higher intensity activity such as mowing the lawn.  He has been experiencing mild headaches, which he attributes to Imdur , and manages them with Tylenol . He also notes mild orthostatic dizziness, lasting less than 15 seconds upon standing. Feels that this is slowly improving post hospitalization.  He reports increased urination  frequency, particularly at night. Denies any history prostate issues and has not noted urinary difficulty.   Today patient denies chest pain, shortness of breath, lower extremity edema, fatigue, palpitations, melena, hematuria, hemoptysis, diaphoresis, weakness, presyncope, syncope, orthopnea, and PND.   Studies Reviewed:    EKG:   EKG Interpretation Date/Time:  Wednesday Apr 11 2024 16:06:26 EDT Ventricular Rate:  58 PR Interval:  192 QRS Duration:  102 QT Interval:  416 QTC Calculation: 408 R Axis:   -20  Text Interpretation: Sinus bradycardia Incomplete right bundle branch block Abnormal QRS-T angle, consider primary T wave abnormality When compared with ECG of 25-Mar-2024 02:11, PREVIOUS ECG IS PRESENT Confirmed by Leala Prince [phone]) on 04/11/2024 4:23:26 PM    03/26/24 TTE  IMPRESSIONS     1. Left ventricular ejection fraction, by estimation, is 55 to 60%. The  left ventricle has normal function. The left ventricle demonstrates  regional wall motion abnormalities with mild anterior and anterolateral  hypokinesis. Left ventricular diastolic  parameters are consistent with Grade I diastolic dysfunction (impaired  relaxation).   2. Right ventricular systolic function is normal. The right ventricular  size is normal. There is normal pulmonary artery systolic pressure. The  estimated right ventricular systolic pressure is 21.1 mmHg.   3. Left atrial size was mildly dilated.   4. The mitral valve is normal in structure. No evidence of mitral valve  regurgitation. No evidence of mitral stenosis.   5. The aortic valve is tricuspid. There is mild calcification of the  aortic valve. Aortic valve regurgitation is mild. No aortic stenosis is  present.   6. Aortic dilatation noted. There is mild dilatation of the aortic root,  measuring  40 mm. There is mild dilatation of the ascending aorta,  measuring 43 mm.   7. The inferior vena cava is normal in size with greater than 50%   respiratory variability, suggesting right atrial pressure of 3 mmHg.   FINDINGS   Left Ventricle: Left ventricular ejection fraction, by estimation, is 55  to 60%. The left ventricle has normal function. The left ventricle  demonstrates regional wall motion abnormalities. The left ventricular  internal cavity size was normal in size.  There is no left ventricular hypertrophy. Left ventricular diastolic  parameters are consistent with Grade I diastolic dysfunction (impaired  relaxation).   Right Ventricle: The right ventricular size is normal. No increase in  right ventricular wall thickness. Right ventricular systolic function is  normal. There is normal pulmonary artery systolic pressure. The tricuspid  regurgitant velocity is 2.13 m/s, and   with an assumed right atrial pressure of 3 mmHg, the estimated right  ventricular systolic pressure is 21.1 mmHg.   Left Atrium: Left atrial size was mildly dilated.   Right Atrium: Right atrial size was normal in size.   Pericardium: There is no evidence of pericardial effusion.   Mitral Valve: The mitral valve is normal in structure. No evidence of  mitral valve regurgitation. No evidence of mitral valve stenosis.   Tricuspid Valve: The tricuspid valve is normal in structure. Tricuspid  valve regurgitation is trivial.   Aortic Valve: The aortic valve is tricuspid. There is mild calcification  of the aortic valve. Aortic valve regurgitation is mild. No aortic  stenosis is present. Aortic valve peak gradient measures 7.6 mmHg.   Pulmonic Valve: The pulmonic valve was normal in structure. Pulmonic valve  regurgitation is not visualized.   Aorta: Aortic dilatation noted. There is mild dilatation of the aortic  root, measuring 40 mm. There is mild dilatation of the ascending aorta,  measuring 43 mm.   Venous: The inferior vena cava is normal in size with greater than 50%  respiratory variability, suggesting right atrial pressure of 3  mmHg.   IAS/Shunts: No atrial level shunt detected by color flow Doppler.   03/26/24 [facility] Conclusions: Significant single-vessel coronary artery disease with sequential 50% and 90% stenoses involving moderate-caliber D1 branch.  There is also mild plaquing of the mid LAD and 50% stenosis of the distal RCA extending into RPAV branch. Normal left ventricular systolic function (LVEF 55-65%) with mildly elevated filling pressure (LVEDP 20 mmHg).   Conditions: Favor medical therapy including up to 12 months of dual antiplatelet therapy with aspirin  and clopidogrel , as tolerated.  Continue amlodipine  for antianginal therapy.  Consider adding ranolazine if chest pain recurs, given baseline low resting heart rate and long-term use of tadalafil , precluding addition of a beta-blocker and long-acting nitrate at this time. If the patient has recurrent angina refractory to medical therapy, PCI to D1 should be considered. Aggressive secondary prevention of coronary artery disease; increase rosuvastatin  to 20 mg daily. Follow-up echocardiogram.   Diagnostic Dominance: Right   Risk Assessment/Calculations:              Physical Exam:   VS:  BP 110/60   Pulse (!) 58   Ht 6' (1.829 m)   Wt 228 lb 6.4 oz (103.6 kg)   SpO2 95%   BMI 30.98 kg/m    Wt Readings from Last 3 Encounters:  04/11/24 228 lb 6.4 oz (103.6 kg)  03/30/24 228 lb (103.4 kg)  03/26/24 229 lb (103.9 kg)     Physical Exam  Vitals reviewed.  Constitutional:      Appearance: Normal appearance.  HENT:     Head: Normocephalic.  Eyes:     Pupils: Pupils are equal, round, and reactive to light.  Cardiovascular:     Rate and Rhythm: Normal rate and regular rhythm.     Pulses: Normal pulses.     Heart sounds: Normal heart sounds.  Pulmonary:     Effort: Pulmonary effort is normal.     Breath sounds: Normal breath sounds.  Abdominal:     General: Abdomen is flat.     Palpations: Abdomen is soft.  Musculoskeletal:     Right  lower leg: No edema.     Left lower leg: No edema.  Skin:    General: Skin is warm and dry.     Capillary Refill: Capillary refill takes less than 2 seconds.  Neurological:     General: No focal deficit present.     Mental Status: He is alert and oriented to person, place, and time.  Psychiatric:        Mood and Affect: Mood normal.        Behavior: Behavior normal.        Thought Content: Thought content normal.        Judgment: Judgment normal.     ASSESSMENT AND PLAN:    Assessment & Plan Coronary artery disease Multivessel coronary artery disease with 50% and 90% stenosis in the D1 branch, 50% stenosis in the distal RCA, and mild plaquing of the mid LAD. Preserved ejection fraction with mild anterior and anterolateral hypokinesis. Managed medically. No recurrence of chest pain since discharge.  - Continue dual antiplatelet therapy with aspirin  and Plavix  for 12 months. Consider long-term Plavix  monotherapy after 12 months of DAPT. - Continue amlodipine  2.5mg  daily for anti-anginal therapy. - Continue Imdur  30mg  daily. Patient having mild "manageable" headaches daily since starting. Discussed dose reduction to 15mg  daily if patient continues to have headaches next week.  - No beta blocker with baseline bradycardia - Refer to cardiac rehab for guided physical activity. - Monitor for recurrence of chest pain and use nitroglycerin  as needed. - Discuss potential need for stenting if symptoms recur or worsen.  Hypercholesterolemia Rhabdomyolysis on higher dose Crestor . Currently on Crestor  10 mg and Zetia  10 mg. Goal to reduce LDL to 55 or less due to multivessel coronary artery disease. Discussed potential use of PCSK9 inhibitors if cholesterol goals are not met with current regimen.  - Continue Crestor  10 mg and Zetia  10 mg. - Recheck cholesterol panel in 6-8 weeks. - Consider referral to lipid clinic if cholesterol goals are not met.  Orthostatic hypotension Lightheadedness upon  standing reported, likely due to orthostatic hypotension. Possibly related to amlodipine  and Imdur , both of which lower blood pressure. Symptoms improving since hospitalization. - Monitor symptoms and report if these persists beyond next week. - Consider reducing Imdur  dose if symptoms persist.  Compensated cirrhosis Compensated cirrhosis secondary to prior acute hepatitis A. No current issues.  Dilated ascending aorta Noted in 2020. Repeat echo during recent admission unchanged with mild dilation of aortic root (40mm) and ascending aorta (43mm).  - Plan for serial outpatient imaging to monitor  Increased urinary frequency Increased urinary frequency with smaller volumes since discharge. No history of prostate problems. Discussed potential bladder hypersensitivity or caffeine  intake as contributing factors. - Monitor symptoms and consider reducing caffeine  intake. - Consult primary care physician if symptoms persist.        Cardiac Rehabilitation Eligibility  Assessment  The patient is ready to start cardiac rehabilitation from a cardiac standpoint.        Signed, Leala Prince, PA-C

## 2024-04-11 NOTE — Patient Instructions (Signed)
 Medication Instructions:   Your physician recommends that you continue on your current medications as directed. Please refer to the Current Medication list given to you today.  *If you need a refill on your cardiac medications before your next appointment, please call your pharmacy*   Lab Work:  IN ONE MONTH HERE AT OUR LAB AT [facility] (FIRST FLOOR) TO CHECK LIPIDS--PLEASE COME FASTING TO THIS LAB APPOINTMENT  If you have labs (blood work) drawn today and your tests are completely normal, you will receive your results only by: MyChart Message (if you have MyChart) OR A paper copy in the mail If you have any lab test that is abnormal or we need to change your treatment, we will call you to review the results.   Follow-Up:  3 MONTHS WITH AN EXTENDER IN THE OFFICE

## 2024-04-17 ENCOUNTER — Telehealth (HOSPITAL_COMMUNITY): Payer: Self-pay

## 2024-04-17 ENCOUNTER — Encounter (HOSPITAL_COMMUNITY): Payer: Self-pay

## 2024-04-17 NOTE — Telephone Encounter (Signed)
Attempted to call patient in regards to Cardiac Rehab - LM on VM   Sent letter 

## 2024-04-17 NOTE — Telephone Encounter (Signed)
 Pt returned phone call and stated that he is interested in the cardiac rehab program. Pt will come in for orientation on 5/30@1 :15 and will attend the 1015 class time.   Sent package

## 2024-04-19 ENCOUNTER — Other Ambulatory Visit (HOSPITAL_COMMUNITY): Payer: Self-pay

## 2024-05-03 ENCOUNTER — Telehealth (HOSPITAL_COMMUNITY): Payer: Self-pay

## 2024-05-03 NOTE — Telephone Encounter (Signed)
  Called pt to confirm appt for 05/04/24 at 1030. Gave pt instructions for appt, what to wear, office address, eating/taking meds before, and if sick to call and reschedule. Pt voiced understanding, all questions answered.   Health history completed? Yes   Arlander Labrum, MS, ACSM-CEP 05/03/2024 3:40 PM

## 2024-05-04 ENCOUNTER — Encounter (HOSPITAL_COMMUNITY)
Admission: RE | Admit: 2024-05-04 | Discharge: 2024-05-04 | Disposition: A | Discharge status: Home / Self Care | Source: Ambulatory Visit | Attending: Cardiology | Admitting: Cardiology

## 2024-05-04 ENCOUNTER — Ambulatory Visit (HOSPITAL_COMMUNITY)

## 2024-05-04 VITALS — BP 102/60 | HR 73 | Ht 72.0 in | Wt 230.2 lb

## 2024-05-04 DIAGNOSIS — I214 Non-ST elevation (NSTEMI) myocardial infarction: Secondary | ICD-10-CM | POA: Diagnosis not present

## 2024-05-04 NOTE — Progress Notes (Signed)
 Cardiac Individual Treatment Plan  Patient Details  Name: Timothy Grant MRN: [id number] Date of Birth: 1946-04-01 Referring Provider:   Flowsheet Row INTENSIVE CARDIAC REHAB ORIENT from 05/04/2024 in [facility]  Referring Provider Peter Sw[place], MD       Initial Encounter Date:  Flowsheet Row INTENSIVE CARDIAC REHAB ORIENT from 05/04/2024 in [facility]  Date 05/04/24       Visit Diagnosis: 03/09/24 NSTEMI (non-ST elevated myocardial infarction) [facility])  Patient's Home Medications on Admission:  Current Outpatient Medications:    acetaminophen  (TYLENOL ) 500 MG tablet, Take 1,000 mg by mouth daily as needed for headache., Disp: , Rfl:    amLODipine  (NORVASC ) 2.5 MG tablet, Take 1 tablet (2.5 mg total) by mouth daily., Disp: 30 tablet, Rfl: 5   aspirin  EC 81 MG tablet, Take 1 tablet (81 mg total) by mouth daily. Swallow whole., Disp: 30 tablet, Rfl: 11   Cetirizine HCl 10 MG CAPS, Take 10 mg by mouth daily., Disp: , Rfl:    Cholecalciferol (VITAMIN D3) 125 MCG (5000 UT) CAPS, Take 5,000 Units by mouth daily., Disp: , Rfl:    clopidogrel  (PLAVIX ) 75 MG tablet, Take 1 tablet (75 mg total) by mouth daily., Disp: 30 tablet, Rfl: 11   cyanocobalamin  (VITAMIN B12) 1000 MCG tablet, Take 1,000 mcg by mouth daily., Disp: , Rfl:    cyclobenzaprine  (FLEXERIL ) 10 MG tablet, Take 1 tablet (10 mg total) by mouth 3 (three) times daily as needed for muscle spasms. (Patient taking differently: Take 10 mg by mouth at bedtime as needed for muscle spasms.), Disp: 90 tablet, Rfl: 3   ezetimibe  (ZETIA ) 10 MG tablet, Take 1 tablet (10 mg total) by mouth daily., Disp: 30 tablet, Rfl: 5   folic acid  (FOLVITE ) 1 MG tablet, TAKE 1 TABLET BY MOUTH DAILY, Disp: 90 tablet, Rfl: 1   Glucosamine HCl 1000 MG TABS, Take 1 tablet by mouth daily., Disp: , Rfl:    isosorbide  mononitrate (IMDUR ) 30 MG 24 hr  tablet, Take 1 tablet (30 mg total) by mouth daily., Disp: 30 tablet, Rfl: 5   levothyroxine  (SYNTHROID ) 137 MCG tablet, Take 1 tablet by mouth daily on Monday, Wednesday, Friday., Disp: 90 tablet, Rfl: 3   levothyroxine  (SYNTHROID ) 150 MCG tablet, TAKE 1 TABLET BY MOUTH ON TUESDAY, THURSDAY, SATURDAY,AND SUNDAY, Disp: 90 tablet, Rfl: 3   Multiple Vitamin (MULTIVITAMIN) capsule, Take 1 capsule by mouth daily. Centrum Senior, Disp: , Rfl:    nitroGLYCERIN  (NITROSTAT ) 0.4 MG SL tablet, Place 1 tablet (0.4 mg total) under the tongue every 5 (five) minutes as needed for chest pain (up to 3 doses)., Disp: 25 tablet, Rfl: 3   Omega-3 Fatty Acids (FISH OIL) 1200 MG CAPS, Take 1,200 mg by mouth daily., Disp: , Rfl:    rosuvastatin  (CRESTOR ) 10 MG tablet, TAKE 1 TABLET BY MOUTH DAILY (Patient taking differently: Take 10 mg by mouth at bedtime.), Disp: 90 tablet, Rfl: 3  Past Medical History: Past Medical History:  Diagnosis Date   Adenomatous polyps    Allergy    seasonal   Arthritis    Cardiomegaly    Cataract    Cirrhosis (HCC)    ED (erectile dysfunction)    Gallbladder polyp    Heart attack (HCC)    Hepatitis A    Hyperlipidemia    Hypothyroidism    Non-traumatic rhabdomyolysis 02/21/2019   Thrombocytopenia (HCC)    Varicocele  02/01/2011   Left testicle    Tobacco Use: Social History   Tobacco Use  Smoking Status Former   Types: Cigarettes, Cigars  Smokeless Tobacco Never  Tobacco Comments   quit 1975    Labs: Review Flowsheet  More data exists      Latest Ref Rng & Units 05/15/2020 08/25/2020 05/18/2021 02/02/2024 03/25/2024  Labs for ITP Cardiac and Pulmonary Rehab  Cholestrol 0 - 200 mg/dL 960  454  098  119  147   LDL (calc) 0 - 99 mg/dL 829  82  83  82  85   HDL-C >40 mg/dL 56.21  59  30.86  57.84  52   Trlycerides <150 mg/dL 696.2  88  95.2  84.1  37     Capillary Blood Glucose: No results found for: "GLUCAP"   Exercise Target Goals: Exercise Program  Goal: Individual exercise prescription set using results from initial 6 min walk test and THRR while considering  patient's activity barriers and safety.   Exercise Prescription Goal: Initial exercise prescription builds to 30-45 minutes a day of aerobic activity, 2-3 days per week.  Home exercise guidelines will be given to patient during program as part of exercise prescription that the participant will acknowledge.  Activity Barriers & Risk Stratification:  Activity Barriers & Cardiac Risk Stratification - 05/04/24 1123       Activity Barriers & Cardiac Risk Stratification   Activity Barriers Arthritis;History of Falls;Joint Problems    Cardiac Risk Stratification High   <5 METs on            6 Minute Walk:  6 Minute Walk     Row Name 05/04/24 1210         6 Minute Walk   Phase Initial     Distance 1500 feet     Walk Time 6 minutes     # of Rest Breaks 0     MPH 2.84     METS 2.59     RPE 9     Perceived Dyspnea  0     VO2 Peak 9.05     Symptoms Yes (comment)     Comments pt stated he felt "swimmy" when turning to sit down at completion of , BP 122/62. Resolved immediately with sitting. After sitting for a few minutes asked pt to stand, lightheaded/swimmy did not return. no other s/sx.     Resting HR 73 bpm     Resting BP 102/60     Resting Oxygen Saturation  97 %     Exercise Oxygen Saturation  during 6 min walk 97 %     Max Ex. HR 101 bpm     Max Ex. BP 122/62     2 Minute Post BP 114/60              Oxygen Initial Assessment:   Oxygen Re-Evaluation:   Oxygen Discharge (Final Oxygen Re-Evaluation):   Initial Exercise Prescription:  Initial Exercise Prescription - 05/04/24 1100       Date of Initial Exercise RX and Referring Provider   Date 05/04/24    Referring Provider Peter [place], MD    Expected Discharge Date 07/25/24      Bike   Level 1    Watts 40    Minutes 15    METs 2      NuStep   Level 2    SPM 70    Minutes 15     METs 2.2  Prescription Details   Frequency (times per week) 3    Duration Progress to 30 minutes of continuous aerobic without signs/symptoms of physical distress      Intensity   THRR 40-80% of Max Heartrate 57-114    Ratings of Perceived Exertion 11-13    Perceived Dyspnea 0-4      Progression   Progression Continue progressive overload as per policy without signs/symptoms or physical distress.      Resistance Training   Training Prescription Yes    Reps 10-15             Perform Capillary Blood Glucose checks as needed.  Exercise Prescription Changes:   Exercise Comments:   Exercise Goals and Review:   Exercise Goals     Row Name 05/04/24 1124             Exercise Goals   Increase Physical Activity Yes       Intervention Provide advice, education, support and counseling about physical activity/exercise needs.;Develop an individualized exercise prescription for aerobic and resistive training based on initial evaluation findings, risk stratification, comorbidities and participant's personal goals.       Expected Outcomes Short Term: Attend rehab on a regular basis to increase amount of physical activity.;Long Term: Exercising regularly at least 3-5 days a week.;Long Term: Add in home exercise to make exercise part of routine and to increase amount of physical activity.       Increase Strength and Stamina Yes       Intervention Provide advice, education, support and counseling about physical activity/exercise needs.;Develop an individualized exercise prescription for aerobic and resistive training based on initial evaluation findings, risk stratification, comorbidities and participant's personal goals.       Expected Outcomes Short Term: Increase workloads from initial exercise prescription for resistance, speed, and METs.;Short Term: Perform resistance training exercises routinely during rehab and add in resistance training at home;Long Term: Improve  cardiorespiratory fitness, muscular endurance and strength as measured by increased METs and functional capacity ( )       Able to understand and use rate of perceived exertion (RPE) scale Yes       Intervention Provide education and explanation on how to use RPE scale       Expected Outcomes Short Term: Able to use RPE daily in rehab to express subjective intensity level;Long Term:  Able to use RPE to guide intensity level when exercising independently       Knowledge and understanding of Target Heart Rate Range (THRR) Yes       Intervention Provide education and explanation of THRR including how the numbers were predicted and where they are located for reference       Expected Outcomes Short Term: Able to state/look up THRR;Short Term: Able to use daily as guideline for intensity in rehab;Long Term: Able to use THRR to govern intensity when exercising independently       Understanding of Exercise Prescription Yes       Intervention Provide education, explanation, and written materials on patient's individual exercise prescription       Expected Outcomes Short Term: Able to explain program exercise prescription;Long Term: Able to explain home exercise prescription to exercise independently                Exercise Goals Re-Evaluation :   Discharge Exercise Prescription (Final Exercise Prescription Changes):   Nutrition:  Target Goals: Understanding of nutrition guidelines, daily intake of sodium 1500mg , cholesterol 200mg , calories 30% from fat and 7%  or less from saturated fats, daily to have 5 or more servings of fruits and vegetables.  Biometrics:  Pre Biometrics - 05/04/24 1121       Pre Biometrics   Waist Circumference 43.5 inches    Hip Circumference 43 inches    Waist to Hip Ratio 1.01 %    Triceps Skinfold 26 mm    % Body Fat 32.7 %    Grip Strength 34 kg    Flexibility 13.5 in    Single Leg Stand 2 seconds              Nutrition Therapy Plan and Nutrition  Goals:   Nutrition Assessments:  MEDIFICTS Score Key: >=70 Need to make dietary changes  40-70 Heart Healthy Diet <= 40 Therapeutic Level Cholesterol Diet    Picture Your Plate Scores: <40 Unhealthy dietary pattern with much room for improvement. 41-50 Dietary pattern unlikely to meet recommendations for good health and room for improvement. 51-60 More healthful dietary pattern, with some room for improvement.  >60 Healthy dietary pattern, although there may be some specific behaviors that could be improved.    Nutrition Goals Re-Evaluation:   Nutrition Goals Re-Evaluation:   Nutrition Goals Discharge (Final Nutrition Goals Re-Evaluation):   Psychosocial: Target Goals: Acknowledge presence or absence of significant depression and/or stress, maximize coping skills, provide positive support system. Participant is able to verbalize types and ability to use techniques and skills needed for reducing stress and depression.  Initial Review & Psychosocial Screening:  Initial Psych Review & Screening - 05/04/24 1124       Initial Review   Current issues with None Identified      Family Dynamics   Good Support System? Yes   wife for support     Barriers   Psychosocial barriers to participate in program There are no identifiable barriers or psychosocial needs.      Screening Interventions   Interventions Encouraged to exercise;Provide feedback about the scores to participant    Expected Outcomes Short Term goal: Identification and review with participant of any Quality of Life or Depression concerns found by scoring the questionnaire.;Long Term goal: The participant improves quality of Life and PHQ9 Scores as seen by post scores and/or verbalization of changes             Quality of Life Scores:  Quality of Life - 05/04/24 1210       Quality of Life   Select Quality of Life      Quality of Life Scores   Health/Function Pre 25.2 %    Socioeconomic Pre 25.43 %     Psych/Spiritual Pre 22.93 %    Family Pre 24 %    GLOBAL Pre 24.6 %            Scores of 19 and below usually indicate a poorer quality of life in these areas.  A difference of  2-3 points is a clinically meaningful difference.  A difference of 2-3 points in the total score of the Quality of Life Index has been associated with significant improvement in overall quality of life, self-image, physical symptoms, and general health in studies assessing change in quality of life.  PHQ-9: Review Flowsheet  More data exists      05/04/2024 02/09/2024 05/16/2023 02/07/2023 03/23/2022  Depression screen PHQ 2/9  Decreased Interest 0 0 0 0 0  Down, Depressed, Hopeless 0 0 0 0 0  PHQ - 2 Score 0 0 0 0 0  Altered sleeping 0 - - - -  Tired, decreased energy 0 - - - -  Change in appetite 1 - - - -  Feeling bad or failure about yourself  1 - - - -  Trouble concentrating 0 - - - -  Moving slowly or fidgety/restless 0 - - - -  Suicidal thoughts 0 - - - -  PHQ-9 Score 2 - - - -   Interpretation of Total Score  Total Score Depression Severity:  1-4 = Minimal depression, 5-9 = Mild depression, 10-14 = Moderate depression, 15-19 = Moderately severe depression, 20-27 = Severe depression   Psychosocial Evaluation and Intervention:   Psychosocial Re-Evaluation:   Psychosocial Discharge (Final Psychosocial Re-Evaluation):   Vocational Rehabilitation: Provide vocational rehab assistance to qualifying candidates.   Vocational Rehab Evaluation & Intervention:  Vocational Rehab - 05/04/24 1125       Initial Vocational Rehab Evaluation & Intervention   Assessment shows need for Vocational Rehabilitation No   retired            Education: Education Goals: Education classes will be provided on a weekly basis, covering required topics. Participant will state understanding/return demonstration of topics presented.     Core Videos: Exercise    Move It!  Clinical staff conducted group or  individual video education with verbal and written material and guidebook.  Patient learns the recommended Pritikin exercise program. Exercise with the goal of living a long, healthy life. Some of the health benefits of exercise include controlled diabetes, healthier blood pressure levels, improved cholesterol levels, improved heart and lung capacity, improved sleep, and better body composition. Everyone should speak with their doctor before starting or changing an exercise routine.  Biomechanical Limitations Clinical staff conducted group or individual video education with verbal and written material and guidebook.  Patient learns how biomechanical limitations can impact exercise and how we can mitigate and possibly overcome limitations to have an impactful and balanced exercise routine.  Body Composition Clinical staff conducted group or individual video education with verbal and written material and guidebook.  Patient learns that body composition (ratio of muscle mass to fat mass) is a key component to assessing overall fitness, rather than body weight alone. Increased fat mass, especially visceral belly fat, can put us  at increased risk for metabolic syndrome, type 2 diabetes, heart disease, and even death. It is recommended to combine diet and exercise (cardiovascular and resistance training) to improve your body composition. Seek guidance from your physician and exercise physiologist before implementing an exercise routine.  Exercise Action Plan Clinical staff conducted group or individual video education with verbal and written material and guidebook.  Patient learns the recommended strategies to achieve and enjoy long-term exercise adherence, including variety, self-motivation, self-efficacy, and positive decision making. Benefits of exercise include fitness, good health, weight management, more energy, better sleep, less stress, and overall well-being.  Medical   Heart Disease Risk  Reduction Clinical staff conducted group or individual video education with verbal and written material and guidebook.  Patient learns our heart is our most vital organ as it circulates oxygen, nutrients, white blood cells, and hormones throughout the entire body, and carries waste away. Data supports a plant-based eating plan like the Pritikin Program for its effectiveness in slowing progression of and reversing heart disease. The video provides a number of recommendations to address heart disease.   Metabolic Syndrome and Belly Fat  Clinical staff conducted group or individual video education with verbal and written material and guidebook.  Patient learns what metabolic syndrome is, how it  leads to heart disease, and how one can reverse it and keep it from coming back. You have metabolic syndrome if you have 3 of the following 5 criteria: abdominal obesity, high blood pressure, high triglycerides, low HDL cholesterol, and high blood sugar.  Hypertension and Heart Disease Clinical staff conducted group or individual video education with verbal and written material and guidebook.  Patient learns that high blood pressure, or hypertension, is very common in the [place] . Hypertension is largely due to excessive salt intake, but other important risk factors include being overweight, physical inactivity, drinking too much alcohol, smoking, and not eating enough potassium from fruits and vegetables. High blood pressure is a leading risk factor for heart attack, stroke, congestive heart failure, dementia, kidney failure, and premature death. Long-term effects of excessive salt intake include stiffening of the arteries and thickening of heart muscle and organ damage. Recommendations include ways to reduce hypertension and the risk of heart disease.  Diseases of Our Time - Focusing on Diabetes Clinical staff conducted group or individual video education with verbal and written material and guidebook.   Patient learns why the best way to stop diseases of our time is prevention, through food and other lifestyle changes. Medicine (such as prescription pills and surgeries) is often only a Band-Aid on the problem, not a long-term solution. Most common diseases of our time include obesity, type 2 diabetes, hypertension, heart disease, and cancer. The Pritikin Program is recommended and has been proven to help reduce, reverse, and/or prevent the damaging effects of metabolic syndrome.  Nutrition   Overview of the Pritikin Eating Plan  Clinical staff conducted group or individual video education with verbal and written material and guidebook.  Patient learns about the Pritikin Eating Plan for disease risk reduction. The Pritikin Eating Plan emphasizes a wide variety of unrefined, minimally-processed carbohydrates, like fruits, vegetables, whole grains, and legumes. Go, Caution, and Stop food choices are explained. Plant-based and lean animal proteins are emphasized. Rationale provided for low sodium intake for blood pressure control, low added sugars for blood sugar stabilization, and low added fats and oils for coronary artery disease risk reduction and weight management.  Calorie Density  Clinical staff conducted group or individual video education with verbal and written material and guidebook.  Patient learns about calorie density and how it impacts the Pritikin Eating Plan. Knowing the characteristics of the food you choose will help you decide whether those foods will lead to weight gain or weight loss, and whether you want to consume more or less of them. Weight loss is usually a side effect of the Pritikin Eating Plan because of its focus on low calorie-dense foods.  Label Reading  Clinical staff conducted group or individual video education with verbal and written material and guidebook.  Patient learns about the Pritikin recommended label reading guidelines and corresponding recommendations  regarding calorie density, added sugars, sodium content, and whole grains.  Dining Out - Part 1  Clinical staff conducted group or individual video education with verbal and written material and guidebook.  Patient learns that restaurant meals can be sabotaging because they can be so high in calories, fat, sodium, and/or sugar. Patient learns recommended strategies on how to positively address this and avoid unhealthy pitfalls.  Facts on Fats  Clinical staff conducted group or individual video education with verbal and written material and guidebook.  Patient learns that lifestyle modifications can be just as effective, if not more so, as many medications for lowering your risk of heart  disease. A Pritikin lifestyle can help to reduce your risk of inflammation and atherosclerosis (cholesterol build-up, or plaque, in the artery walls). Lifestyle interventions such as dietary choices and physical activity address the cause of atherosclerosis. A review of the types of fats and their impact on blood cholesterol levels, along with dietary recommendations to reduce fat intake is also included.  Nutrition Action Plan  Clinical staff conducted group or individual video education with verbal and written material and guidebook.  Patient learns how to incorporate Pritikin recommendations into their lifestyle. Recommendations include planning and keeping personal health goals in mind as an important part of their success.  Healthy Mind-Set    Healthy Minds, Bodies, Hearts  Clinical staff conducted group or individual video education with verbal and written material and guidebook.  Patient learns how to identify when they are stressed. Video will discuss the impact of that stress, as well as the many benefits of stress management. Patient will also be introduced to stress management techniques. The way we think, act, and feel has an impact on our hearts.  How Our Thoughts Can Heal Our Hearts  Clinical staff  conducted group or individual video education with verbal and written material and guidebook.  Patient learns that negative thoughts can cause depression and anxiety. This can result in negative lifestyle behavior and serious health problems. Cognitive behavioral therapy is an effective method to help control our thoughts in order to change and improve our emotional outlook.  Additional Videos:  Exercise    Improving Performance  Clinical staff conducted group or individual video education with verbal and written material and guidebook.  Patient learns to use a non-linear approach by alternating intensity levels and lengths of time spent exercising to help burn more calories and lose more body fat. Cardiovascular exercise helps improve heart health, metabolism, hormonal balance, blood sugar control, and recovery from fatigue. Resistance training improves strength, endurance, balance, coordination, reaction time, metabolism, and muscle mass. Flexibility exercise improves circulation, posture, and balance. Seek guidance from your physician and exercise physiologist before implementing an exercise routine and learn your capabilities and proper form for all exercise.  Introduction to Yoga  Clinical staff conducted group or individual video education with verbal and written material and guidebook.  Patient learns about yoga, a discipline of the coming together of mind, breath, and body. The benefits of yoga include improved flexibility, improved range of motion, better posture and core strength, increased lung function, weight loss, and positive self-image. Yoga's heart health benefits include lowered blood pressure, healthier heart rate, decreased cholesterol and triglyceride levels, improved immune function, and reduced stress. Seek guidance from your physician and exercise physiologist before implementing an exercise routine and learn your capabilities and proper form for all exercise.  Medical   Aging:  Enhancing Your Quality of Life  Clinical staff conducted group or individual video education with verbal and written material and guidebook.  Patient learns key strategies and recommendations to stay in good physical health and enhance quality of life, such as prevention strategies, having an advocate, securing a Health Care Proxy and Power of Attorney, and keeping a list of medications and system for tracking them. It also discusses how to avoid risk for bone loss.  Biology of Weight Control  Clinical staff conducted group or individual video education with verbal and written material and guidebook.  Patient learns that weight gain occurs because we consume more calories than we burn (eating more, moving less). Even if your body weight is normal, you  may have higher ratios of fat compared to muscle mass. Too much body fat puts you at increased risk for cardiovascular disease, heart attack, stroke, type 2 diabetes, and obesity-related cancers. In addition to exercise, following the Pritikin Eating Plan can help reduce your risk.  Decoding Lab Results  Clinical staff conducted group or individual video education with verbal and written material and guidebook.  Patient learns that lab test reflects one measurement whose values change over time and are influenced by many factors, including medication, stress, sleep, exercise, food, hydration, pre-existing medical conditions, and more. It is recommended to use the knowledge from this video to become more involved with your lab results and evaluate your numbers to speak with your doctor.   Diseases of Our Time - Overview  Clinical staff conducted group or individual video education with verbal and written material and guidebook.  Patient learns that according to the CDC, 50% to 70% of chronic diseases (such as obesity, type 2 diabetes, elevated lipids, hypertension, and heart disease) are avoidable through lifestyle improvements including healthier food  choices, listening to satiety cues, and increased physical activity.  Sleep Disorders Clinical staff conducted group or individual video education with verbal and written material and guidebook.  Patient learns how good quality and duration of sleep are important to overall health and well-being. Patient also learns about sleep disorders and how they impact health along with recommendations to address them, including discussing with a physician.  Nutrition  Dining Out - Part 2 Clinical staff conducted group or individual video education with verbal and written material and guidebook.  Patient learns how to plan ahead and communicate in order to maximize their dining experience in a healthy and nutritious manner. Included are recommended food choices based on the type of restaurant the patient is visiting.   Fueling a Banker conducted group or individual video education with verbal and written material and guidebook.  There is a strong connection between our food choices and our health. Diseases like obesity and type 2 diabetes are very prevalent and are in large-part due to lifestyle choices. The Pritikin Eating Plan provides plenty of food and hunger-curbing satisfaction. It is easy to follow, affordable, and helps reduce health risks.  Menu Workshop  Clinical staff conducted group or individual video education with verbal and written material and guidebook.  Patient learns that restaurant meals can sabotage health goals because they are often packed with calories, fat, sodium, and sugar. Recommendations include strategies to plan ahead and to communicate with the manager, chef, or server to help order a healthier meal.  Planning Your Eating Strategy  Clinical staff conducted group or individual video education with verbal and written material and guidebook.  Patient learns about the Pritikin Eating Plan and its benefit of reducing the risk of disease. The Pritikin Eating  Plan does not focus on calories. Instead, it emphasizes high-quality, nutrient-rich foods. By knowing the characteristics of the foods, we choose, we can determine their calorie density and make informed decisions.  Targeting Your Nutrition Priorities  Clinical staff conducted group or individual video education with verbal and written material and guidebook.  Patient learns that lifestyle habits have a tremendous impact on disease risk and progression. This video provides eating and physical activity recommendations based on your personal health goals, such as reducing LDL cholesterol, losing weight, preventing or controlling type 2 diabetes, and reducing high blood pressure.  Vitamins and Minerals  Clinical staff conducted group or individual video education with  verbal and written material and guidebook.  Patient learns different ways to obtain key vitamins and minerals, including through a recommended healthy diet. It is important to discuss all supplements you take with your doctor.   Healthy Mind-Set    Smoking Cessation  Clinical staff conducted group or individual video education with verbal and written material and guidebook.  Patient learns that cigarette smoking and tobacco addiction pose a serious health risk which affects millions of people. Stopping smoking will significantly reduce the risk of heart disease, lung disease, and many forms of cancer. Recommended strategies for quitting are covered, including working with your doctor to develop a successful plan.  Culinary   Becoming a Set designer conducted group or individual video education with verbal and written material and guidebook.  Patient learns that cooking at home can be healthy, cost-effective, quick, and puts them in control. Keys to cooking healthy recipes will include looking at your recipe, assessing your equipment needs, planning ahead, making it simple, choosing cost-effective seasonal ingredients,  and limiting the use of added fats, salts, and sugars.  Cooking - Breakfast and Snacks  Clinical staff conducted group or individual video education with verbal and written material and guidebook.  Patient learns how important breakfast is to satiety and nutrition through the entire day. Recommendations include key foods to eat during breakfast to help stabilize blood sugar levels and to prevent overeating at meals later in the day. Planning ahead is also a key component.  Cooking - Educational psychologist conducted group or individual video education with verbal and written material and guidebook.  Patient learns eating strategies to improve overall health, including an approach to cook more at home. Recommendations include thinking of animal protein as a side on your plate rather than center stage and focusing instead on lower calorie dense options like vegetables, fruits, whole grains, and plant-based proteins, such as beans. Making sauces in large quantities to freeze for later and leaving the skin on your vegetables are also recommended to maximize your experience.  Cooking - Healthy Salads and Dressing Clinical staff conducted group or individual video education with verbal and written material and guidebook.  Patient learns that vegetables, fruits, whole grains, and legumes are the foundations of the Pritikin Eating Plan. Recommendations include how to incorporate each of these in flavorful and healthy salads, and how to create homemade salad dressings. Proper handling of ingredients is also covered. Cooking - Soups and [employer] - Soups and Desserts Clinical staff conducted group or individual video education with verbal and written material and guidebook.  Patient learns that Pritikin soups and desserts make for easy, nutritious, and delicious snacks and meal components that are low in sodium, fat, sugar, and calorie density, while high in vitamins, minerals, and filling  fiber. Recommendations include simple and healthy ideas for soups and desserts.   Overview     The Pritikin Solution Program Overview Clinical staff conducted group or individual video education with verbal and written material and guidebook.  Patient learns that the results of the Pritikin Program have been documented in more than 100 articles published in peer-reviewed journals, and the benefits include reducing risk factors for (and, in some cases, even reversing) high cholesterol, high blood pressure, type 2 diabetes, obesity, and more! An overview of the three key pillars of the Pritikin Program will be covered: eating well, doing regular exercise, and having a healthy mind-set.  WORKSHOPS  Exercise: Exercise Basics: Building Your Action  Plan Clinical staff led group instruction and group discussion with PowerPoint presentation and patient guidebook. To enhance the learning environment the use of posters, models and videos may be added. At the conclusion of this workshop, patients will comprehend the difference between physical activity and exercise, as well as the benefits of incorporating both, into their routine. Patients will understand the FITT (Frequency, Intensity, Time, and Type) principle and how to use it to build an exercise action plan. In addition, safety concerns and other considerations for exercise and cardiac rehab will be addressed by the presenter. The purpose of this lesson is to promote a comprehensive and effective weekly exercise routine in order to improve patients' overall level of fitness.   Managing Heart Disease: Your Path to a Healthier Heart Clinical staff led group instruction and group discussion with PowerPoint presentation and patient guidebook. To enhance the learning environment the use of posters, models and videos may be added.At the conclusion of this workshop, patients will understand the anatomy and physiology of the heart. Additionally, they will  understand how Pritikin's three pillars impact the risk factors, the progression, and the management of heart disease.  The purpose of this lesson is to provide a high-level overview of the heart, heart disease, and how the Pritikin lifestyle positively impacts risk factors.  Exercise Biomechanics Clinical staff led group instruction and group discussion with PowerPoint presentation and patient guidebook. To enhance the learning environment the use of posters, models and videos may be added. Patients will learn how the structural parts of their bodies function and how these functions impact their daily activities, movement, and exercise. Patients will learn how to promote a neutral spine, learn how to manage pain, and identify ways to improve their physical movement in order to promote healthy living. The purpose of this lesson is to expose patients to common physical limitations that impact physical activity. Participants will learn practical ways to adapt and manage aches and pains, and to minimize their effect on regular exercise. Patients will learn how to maintain good posture while sitting, walking, and lifting.  Balance Training and Fall Prevention  Clinical staff led group instruction and group discussion with PowerPoint presentation and patient guidebook. To enhance the learning environment the use of posters, models and videos may be added. At the conclusion of this workshop, patients will understand the importance of their sensorimotor skills (vision, proprioception, and the vestibular system) in maintaining their ability to balance as they age. Patients will apply a variety of balancing exercises that are appropriate for their current level of function. Patients will understand the common causes for poor balance, possible solutions to these problems, and ways to modify their physical environment in order to minimize their fall risk. The purpose of this lesson is to teach patients  about the importance of maintaining balance as they age and ways to minimize their risk of falling.  WORKSHOPS   Nutrition:  Fueling a Ship broker led group instruction and group discussion with PowerPoint presentation and patient guidebook. To enhance the learning environment the use of posters, models and videos may be added. Patients will review the foundational principles of the Pritikin Eating Plan and understand what constitutes a serving size in each of the food groups. Patients will also learn Pritikin-friendly foods that are better choices when away from home and review make-ahead meal and snack options. Calorie density will be reviewed and applied to three nutrition priorities: weight maintenance, weight loss, and weight gain. The purpose of this lesson  is to reinforce (in a group setting) the key concepts around what patients are recommended to eat and how to apply these guidelines when away from home by planning and selecting Pritikin-friendly options. Patients will understand how calorie density may be adjusted for different weight management goals.  Mindful Eating  Clinical staff led group instruction and group discussion with PowerPoint presentation and patient guidebook. To enhance the learning environment the use of posters, models and videos may be added. Patients will briefly review the concepts of the Pritikin Eating Plan and the importance of low-calorie dense foods. The concept of mindful eating will be introduced as well as the importance of paying attention to internal hunger signals. Triggers for non-hunger eating and techniques for dealing with triggers will be explored. The purpose of this lesson is to provide patients with the opportunity to review the basic principles of the Pritikin Eating Plan, discuss the value of eating mindfully and how to measure internal cues of hunger and fullness using the Hunger Scale. Patients will also discuss reasons for non-hunger  eating and learn strategies to use for controlling emotional eating.  Targeting Your Nutrition Priorities Clinical staff led group instruction and group discussion with PowerPoint presentation and patient guidebook. To enhance the learning environment the use of posters, models and videos may be added. Patients will learn how to determine their genetic susceptibility to disease by reviewing their family history. Patients will gain insight into the importance of diet as part of an overall healthy lifestyle in mitigating the impact of genetics and other environmental insults. The purpose of this lesson is to provide patients with the opportunity to assess their personal nutrition priorities by looking at their family history, their own health history and current risk factors. Patients will also be able to discuss ways of prioritizing and modifying the Pritikin Eating Plan for their highest risk areas  Menu  Clinical staff led group instruction and group discussion with PowerPoint presentation and patient guidebook. To enhance the learning environment the use of posters, models and videos may be added. Using menus brought in from [employer], or printed from [employer], patients will apply the Pritikin dining out guidelines that were presented in the [employer] video. Patients will also be able to practice these guidelines in a variety of provided scenarios. The purpose of this lesson is to provide patients with the opportunity to practice hands-on learning of the Pritikin Dining Out guidelines with actual menus and practice scenarios.  Label Reading Clinical staff led group instruction and group discussion with PowerPoint presentation and patient guidebook. To enhance the learning environment the use of posters, models and videos may be added. Patients will review and discuss the Pritikin label reading guidelines presented in Pritikin's Label Reading Educational series video.  Using fool labels brought in from local grocery stores and markets, patients will apply the label reading guidelines and determine if the packaged food meet the Pritikin guidelines. The purpose of this lesson is to provide patients with the opportunity to review, discuss, and practice hands-on learning of the Pritikin Label Reading guidelines with actual packaged food labels. Cooking School  Pritikin's [employer] are designed to teach patients ways to prepare quick, simple, and affordable recipes at home. The importance of nutrition's role in chronic disease risk reduction is reflected in its emphasis in the overall Pritikin program. By learning how to prepare essential core Pritikin Eating Plan recipes, patients will increase control over what they eat; be able to customize the  flavor of foods without the use of added salt, sugar, or fat; and improve the quality of the food they consume. By learning a set of core recipes which are easily assembled, quickly prepared, and affordable, patients are more likely to prepare more healthy foods at home. These workshops focus on convenient breakfasts, simple entres, side dishes, and desserts which can be prepared with minimal effort and are consistent with nutrition recommendations for cardiovascular risk reduction. Cooking [employer] are taught by a Armed forces logistics/support/administrative officer (RD) who has been trained by the [employer]. The chef or RD has a clear understanding of the importance of minimizing - if not completely eliminating - added fat, sugar, and sodium in recipes. Throughout the series of Cooking School Workshop sessions, patients will learn about healthy ingredients and efficient methods of cooking to build confidence in their capability to prepare    Cooking School weekly topics:  Adding Flavor- Sodium-Free  Fast and Healthy Breakfasts  Powerhouse Plant-Based Proteins  Satisfying Salads and Dressings  Simple Sides and  Sauces  International Cuisine-Spotlight on the [employer] Zones  Delicious Desserts  Savory Soups  [employer] - Meals in a Astronomer Appetizers and Snacks  Comforting Weekend Breakfasts  One-Pot Wonders   Fast Evening Meals  Landscape architect Your Pritikin Plate  WORKSHOPS   Healthy Mindset (Psychosocial):  Focused Goals, Sustainable Changes Clinical staff led group instruction and group discussion with PowerPoint presentation and patient guidebook. To enhance the learning environment the use of posters, models and videos may be added. Patients will be able to apply effective goal setting strategies to establish at least one personal goal, and then take consistent, meaningful action toward that goal. They will learn to identify common barriers to achieving personal goals and develop strategies to overcome them. Patients will also gain an understanding of how our mind-set can impact our ability to achieve goals and the importance of cultivating a positive and growth-oriented mind-set. The purpose of this lesson is to provide patients with a deeper understanding of how to set and achieve personal goals, as well as the tools and strategies needed to overcome common obstacles which may arise along the way.  From Head to Heart: The Power of a Healthy Outlook  Clinical staff led group instruction and group discussion with PowerPoint presentation and patient guidebook. To enhance the learning environment the use of posters, models and videos may be added. Patients will be able to recognize and describe the impact of emotions and mood on physical health. They will discover the importance of self-care and explore self-care practices which may work for them. Patients will also learn how to utilize the 4 C's to cultivate a healthier outlook and better manage stress and challenges. The purpose of this lesson is to demonstrate to patients how a healthy outlook is an essential part of  maintaining good health, especially as they continue their cardiac rehab journey.  Healthy Sleep for a Healthy Heart Clinical staff led group instruction and group discussion with PowerPoint presentation and patient guidebook. To enhance the learning environment the use of posters, models and videos may be added. At the conclusion of this workshop, patients will be able to demonstrate knowledge of the importance of sleep to overall health, well-being, and quality of life. They will understand the symptoms of, and treatments for, common sleep disorders. Patients will also be able to identify daytime and nighttime behaviors which impact sleep, and they will be able to apply these  tools to help manage sleep-related challenges. The purpose of this lesson is to provide patients with a general overview of sleep and outline the importance of quality sleep. Patients will learn about a few of the most common sleep disorders. Patients will also be introduced to the concept of "sleep hygiene," and discover ways to self-manage certain sleeping problems through simple daily behavior changes. Finally, the workshop will motivate patients by clarifying the links between quality sleep and their goals of heart-healthy living.   Recognizing and Reducing Stress Clinical staff led group instruction and group discussion with PowerPoint presentation and patient guidebook. To enhance the learning environment the use of posters, models and videos may be added. At the conclusion of this workshop, patients will be able to understand the types of stress reactions, differentiate between acute and chronic stress, and recognize the impact that chronic stress has on their health. They will also be able to apply different coping mechanisms, such as reframing negative self-talk. Patients will have the opportunity to practice a variety of stress management techniques, such as deep abdominal breathing, progressive muscle relaxation, and/or  guided imagery.  The purpose of this lesson is to educate patients on the role of stress in their lives and to provide healthy techniques for coping with it.  Learning Barriers/Preferences:  Learning Barriers/Preferences - 05/04/24 1124       Learning Barriers/Preferences   Learning Barriers Sight   glasses   Learning Preferences Audio;Computer/Internet;Group Instruction;Skilled Demonstration;Verbal Instruction;Video;Written Material;Individual Instruction;Pictoral             Education Topics:  Knowledge Questionnaire Score:  Knowledge Questionnaire Score - 05/04/24 1147       Knowledge Questionnaire Score   Pre Score 23/24             Core Components/Risk Factors/Patient Goals at Admission:  Personal Goals and Risk Factors at Admission - 05/04/24 1125       Core Components/Risk Factors/Patient Goals on Admission    Weight Management Yes;Obesity    Intervention Weight Management: Develop a combined nutrition and exercise program designed to reach desired caloric intake, while maintaining appropriate intake of nutrient and fiber, sodium and fats, and appropriate energy expenditure required for the weight goal.;Weight Management: Provide education and appropriate resources to help participant work on and attain dietary goals.;Weight Management/Obesity: Establish reasonable short term and long term weight goals.;Obesity: Provide education and appropriate resources to help participant work on and attain dietary goals.    Expected Outcomes Short Term: Continue to assess and modify interventions until short term weight is achieved;Long Term: Adherence to nutrition and physical activity/exercise program aimed toward attainment of established weight goal;Understanding recommendations for meals to include 15-35% energy as protein, 25-35% energy from fat, 35-60% energy from carbohydrates, less than 200mg  of dietary cholesterol, 20-35 gm of total fiber daily;Understanding of distribution  of calorie intake throughout the day with the consumption of 4-5 meals/snacks    Hypertension Yes    Intervention Provide education on lifestyle modifcations including regular physical activity/exercise, weight management, moderate sodium restriction and increased consumption of fresh fruit, vegetables, and low fat dairy, alcohol moderation, and smoking cessation.;Monitor prescription use compliance.    Expected Outcomes Short Term: Continued assessment and intervention until BP is < 140/75mm HG in hypertensive participants. < 130/33mm HG in hypertensive participants with diabetes, heart failure or chronic kidney disease.;Long Term: Maintenance of blood pressure at goal levels.    Lipids Yes    Intervention Provide education and support for participant on nutrition & aerobic/resistive exercise  along with prescribed medications to achieve LDL 70mg , HDL >40mg .    Expected Outcomes Long Term: Cholesterol controlled with medications as prescribed, with individualized exercise RX and with personalized nutrition plan. Value goals: LDL < 70mg , HDL > 40 mg.;Short Term: Participant states understanding of desired cholesterol values and is compliant with medications prescribed. Participant is following exercise prescription and nutrition guidelines.             Core Components/Risk Factors/Patient Goals Review:    Core Components/Risk Factors/Patient Goals at Discharge (Final Review):    ITP Comments:  ITP Comments     Row Name 05/04/24 1146           ITP Comments Dr. Gaylyn Keas medical director. Introduction to pritikin education/intensive cardiac rehab. Initial orientation packet reviewed with patient.                Comments: Participant attended orientation for the cardiac rehabilitation program on  05/04/2024  to perform initial intake and exercise walk test. Patient introduced to the Pritikin Program education and orientation packet was reviewed. Completed 6-minute walk test,  measurements, initial ITP, and exercise prescription. Vital signs stable. Telemetry-normal sinus rhythm with occasional PVCs, asymptomatic. Pt stated he was "swimmy headed" when turning to sit down at the end of . Bp 122/60, SpO2 97%, telemetry SR with HR 80. Pt stated "swimmy" feeling resolved immediately once seated. Pt sat for about 5 minutes, asked to stand and feeling did not return. Pt felt he turned too quickly to sit down. No other s/sx.   Service time was from 1033 to 1235.  Arlander Labrum, MS, ACSM-CEP 05/04/2024 1:44 PM

## 2024-05-04 NOTE — Progress Notes (Addendum)
 Cardiac Rehab Medication Review   Does the patient  feel that his/her medications are working for him/her? Yes    Has the patient been experiencing any side effects to the medications prescribed? No  Does the patient measure his/her own blood pressure or blood glucose at home?   Yes  Does the patient have any problems obtaining medications due to transportation or finances?   No  Understanding of regimen: excellent Understanding of indications: excellent Potential of compliance: excellent    Comments: Timothy Grant knows his medications and regime well. He has a BP cuff, but said it doesn't work too well. He is going to try to get a new one possibly through insurance.    Arlander Labrum, MS, ACSM-CEP 05/04/2024 11:25 AM

## 2024-05-07 ENCOUNTER — Encounter (HOSPITAL_COMMUNITY)
Admission: RE | Admit: 2024-05-07 | Discharge: 2024-05-07 | Disposition: A | Discharge status: Home / Self Care | Source: Ambulatory Visit | Attending: Cardiology | Admitting: Cardiology

## 2024-05-07 ENCOUNTER — Ambulatory Visit (HOSPITAL_COMMUNITY)

## 2024-05-07 DIAGNOSIS — Z5189 Encounter for other specified aftercare: Secondary | ICD-10-CM | POA: Diagnosis not present

## 2024-05-07 DIAGNOSIS — I252 Old myocardial infarction: Secondary | ICD-10-CM | POA: Insufficient documentation

## 2024-05-07 DIAGNOSIS — I214 Non-ST elevation (NSTEMI) myocardial infarction: Secondary | ICD-10-CM

## 2024-05-07 DIAGNOSIS — Z87891 Personal history of nicotine dependence: Secondary | ICD-10-CM | POA: Diagnosis not present

## 2024-05-07 NOTE — Progress Notes (Signed)
 Daily Session Note  Patient Details  Name: Timothy Grant MRN: [id number] Date of Birth: 07/24/1946 Referring Provider:   Flowsheet Row INTENSIVE CARDIAC REHAB ORIENT from 05/04/2024 in [facility]  Referring Provider Peter Swaziland, MD       Encounter Date: 05/07/2024  Check In:  Session Check In - 05/07/24 1042       Check-In   Supervising physician immediately available to respond to emergencies [facility] MD immediately available    Physician(s) Koren Persons NP    Location [facility]    Staff Present France Ina, MS, Exercise Physiologist;David Vernel Golds, MS, ACSM-CEP, CCRP, Exercise Physiologist;Olinty Gaylene Kays, MS, ACSM-CEP, Exercise Physiologist;Jetta Otho Blitz BS, ACSM-CEP, Exercise Physiologist;Chassie Pennix, RN, BSN    Virtual Visit No    Medication changes reported     No    Fall or balance concerns reported    No    Tobacco Cessation No Change    Warm-up and Cool-down Performed as group-led instruction    Resistance Training Performed Yes    VAD Patient? No    PAD/SET Patient? No      Pain Assessment   Currently in Pain? No/denies    Pain Score 0-No pain    Multiple Pain Sites No             Capillary Blood Glucose: No results found for this or any previous visit (from the past 24 hours).   Exercise Prescription Changes - 05/07/24 1039       Response to Exercise   Blood Pressure (Admit) 102/60    Blood Pressure (Exercise) 152/76    Blood Pressure (Exit) 100/62    Heart Rate (Admit) 78 bpm    Heart Rate (Exercise) 120 bpm    Heart Rate (Exit) 86 bpm    Symptoms None    Comments Off to a good start with exercise.    Duration Continue with 30 min of aerobic exercise without signs/symptoms of physical distress.    Intensity THRR unchanged      Progression   Progression Continue to progress workloads to maintain intensity without signs/symptoms of physical distress.    Average METs  2.7      Resistance Training   Training Prescription Yes    Weight 3 lbs    Reps 10-15    Time 5 Minutes      Interval Training   Interval Training No      Bike   Level 1    Watts 21    Minutes 15    METs 2.6      NuStep   Level 1    SPM 100    Minutes 15    METs 2.8             Social History   Tobacco Use  Smoking Status Former   Types: Cigarettes, Cigars  Smokeless Tobacco Never  Tobacco Comments   quit 1975    Goals Met:  Exercise tolerated well No report of concerns or symptoms today Strength training completed today  Goals Unmet:  Not Applicable  Comments: Pt started cardiac rehab today.  Pt tolerated light exercise without difficulty. VSS, telemetry-Sinus Rhythm, asymptomatic.  Medication list reconciled. Pt denies barriers to medicaiton compliance.  PSYCHOSOCIAL ASSESSMENT:  PHQ-2. Pt exhibits positive coping skills, hopeful outlook with supportive family. No psychosocial needs identified at this time, no psychosocial interventions necessary.    Pt enjoys golf,yard work, spending time with his yorkie, gardening,  reading and travel.   Pt oriented to exercise equipment and routine.    Understanding verbalized.Monte Antonio RN BSN    Dr. Gaylyn Keas is Medical Director for Cardiac Rehab at [facility].

## 2024-05-09 ENCOUNTER — Encounter (HOSPITAL_COMMUNITY)
Admission: RE | Admit: 2024-05-09 | Discharge: 2024-05-09 | Disposition: A | Discharge status: Home / Self Care | Source: Ambulatory Visit | Attending: Cardiology | Admitting: Cardiology

## 2024-05-09 DIAGNOSIS — I214 Non-ST elevation (NSTEMI) myocardial infarction: Secondary | ICD-10-CM | POA: Diagnosis not present

## 2024-05-09 DIAGNOSIS — Z87891 Personal history of nicotine dependence: Secondary | ICD-10-CM | POA: Diagnosis not present

## 2024-05-09 DIAGNOSIS — I252 Old myocardial infarction: Secondary | ICD-10-CM | POA: Diagnosis not present

## 2024-05-09 DIAGNOSIS — E782 Mixed hyperlipidemia: Secondary | ICD-10-CM | POA: Diagnosis not present

## 2024-05-09 DIAGNOSIS — Z5189 Encounter for other specified aftercare: Secondary | ICD-10-CM | POA: Diagnosis not present

## 2024-05-10 ENCOUNTER — Ambulatory Visit: Payer: Self-pay | Admitting: *Deleted

## 2024-05-10 DIAGNOSIS — K7469 Other cirrhosis of liver: Secondary | ICD-10-CM

## 2024-05-10 DIAGNOSIS — E782 Mixed hyperlipidemia: Secondary | ICD-10-CM

## 2024-05-10 DIAGNOSIS — Z79899 Other long term (current) drug therapy: Secondary | ICD-10-CM

## 2024-05-10 DIAGNOSIS — I214 Non-ST elevation (NSTEMI) myocardial infarction: Secondary | ICD-10-CM

## 2024-05-10 LAB — LIPID PANEL
Chol/HDL Ratio: 2.6 ratio (ref 0.0–5.0)
Cholesterol, Total: 124 mg/dL (ref 100–199)
HDL: 48 mg/dL (ref >39)
LDL Chol Calc (NIH): 63 mg/dL (ref 0–99)
Triglycerides: 60 mg/dL (ref 0–149)
VLDL Cholesterol Cal: 13 mg/dL (ref 5–40)

## 2024-05-10 NOTE — Telephone Encounter (Signed)
 Leala Prince, PA-C  05/10/24  8:17 AM Result Note   Cholesterol is still not at goal. Given his history of rhabdomyolysis with higher doses of Crestor , I would strongly advise referral to lipid clinic for consideration of Repatha initiation. This is also something Dr. Swaziland has supported previously as well.    The patient has been notified of the result and verbalized understanding.  All questions (if any) were answered.  Pt agrees to be referred to lipid clinic for consideration of Repatha initiation.  Pt aware I will place the referral to lipid clinic in the system and send a message to our scheduling team to reach out to him to coordinate this appt.   Pt verbalized understanding and agrees with this plan.

## 2024-05-11 ENCOUNTER — Encounter (HOSPITAL_COMMUNITY)
Admission: RE | Admit: 2024-05-11 | Discharge: 2024-05-11 | Disposition: A | Discharge status: Home / Self Care | Source: Ambulatory Visit | Attending: Cardiology | Admitting: Cardiology

## 2024-05-11 DIAGNOSIS — I252 Old myocardial infarction: Secondary | ICD-10-CM | POA: Diagnosis not present

## 2024-05-11 DIAGNOSIS — Z87891 Personal history of nicotine dependence: Secondary | ICD-10-CM | POA: Diagnosis not present

## 2024-05-11 DIAGNOSIS — I214 Non-ST elevation (NSTEMI) myocardial infarction: Secondary | ICD-10-CM

## 2024-05-11 DIAGNOSIS — Z5189 Encounter for other specified aftercare: Secondary | ICD-10-CM | POA: Diagnosis not present

## 2024-05-14 ENCOUNTER — Encounter (HOSPITAL_COMMUNITY)
Admission: RE | Admit: 2024-05-14 | Discharge: 2024-05-14 | Disposition: A | Discharge status: Home / Self Care | Source: Ambulatory Visit | Attending: Cardiology | Admitting: Cardiology

## 2024-05-14 DIAGNOSIS — I214 Non-ST elevation (NSTEMI) myocardial infarction: Secondary | ICD-10-CM

## 2024-05-14 DIAGNOSIS — Z87891 Personal history of nicotine dependence: Secondary | ICD-10-CM | POA: Diagnosis not present

## 2024-05-14 DIAGNOSIS — Z5189 Encounter for other specified aftercare: Secondary | ICD-10-CM | POA: Diagnosis not present

## 2024-05-14 DIAGNOSIS — I252 Old myocardial infarction: Secondary | ICD-10-CM | POA: Diagnosis not present

## 2024-05-16 ENCOUNTER — Encounter (HOSPITAL_COMMUNITY)
Admission: RE | Admit: 2024-05-16 | Discharge: 2024-05-16 | Disposition: A | Discharge status: Home / Self Care | Source: Ambulatory Visit | Attending: Cardiology | Admitting: Cardiology

## 2024-05-16 DIAGNOSIS — I252 Old myocardial infarction: Secondary | ICD-10-CM | POA: Diagnosis not present

## 2024-05-16 DIAGNOSIS — Z5189 Encounter for other specified aftercare: Secondary | ICD-10-CM | POA: Diagnosis not present

## 2024-05-16 DIAGNOSIS — Z87891 Personal history of nicotine dependence: Secondary | ICD-10-CM | POA: Diagnosis not present

## 2024-05-16 DIAGNOSIS — I214 Non-ST elevation (NSTEMI) myocardial infarction: Secondary | ICD-10-CM

## 2024-05-18 ENCOUNTER — Encounter (HOSPITAL_COMMUNITY)
Admission: RE | Admit: 2024-05-18 | Discharge: 2024-05-18 | Disposition: A | Discharge status: Home / Self Care | Source: Ambulatory Visit | Attending: Cardiology | Admitting: Cardiology

## 2024-05-18 DIAGNOSIS — Z5189 Encounter for other specified aftercare: Secondary | ICD-10-CM | POA: Diagnosis not present

## 2024-05-18 DIAGNOSIS — Z87891 Personal history of nicotine dependence: Secondary | ICD-10-CM | POA: Diagnosis not present

## 2024-05-18 DIAGNOSIS — I252 Old myocardial infarction: Secondary | ICD-10-CM | POA: Diagnosis not present

## 2024-05-18 DIAGNOSIS — I214 Non-ST elevation (NSTEMI) myocardial infarction: Secondary | ICD-10-CM

## 2024-05-21 ENCOUNTER — Encounter (HOSPITAL_COMMUNITY)
Admission: RE | Admit: 2024-05-21 | Discharge: 2024-05-21 | Disposition: A | Discharge status: Home / Self Care | Source: Ambulatory Visit | Attending: Cardiology | Admitting: Cardiology

## 2024-05-21 ENCOUNTER — Ambulatory Visit (INDEPENDENT_AMBULATORY_CARE_PROVIDER_SITE_OTHER): Payer: Medicare Other

## 2024-05-21 VITALS — Ht 72.0 in | Wt 221.5 lb

## 2024-05-21 DIAGNOSIS — Z0000 Encounter for general adult medical examination without abnormal findings: Secondary | ICD-10-CM | POA: Diagnosis not present

## 2024-05-21 DIAGNOSIS — Z5189 Encounter for other specified aftercare: Secondary | ICD-10-CM | POA: Diagnosis not present

## 2024-05-21 DIAGNOSIS — Z87891 Personal history of nicotine dependence: Secondary | ICD-10-CM | POA: Diagnosis not present

## 2024-05-21 DIAGNOSIS — I214 Non-ST elevation (NSTEMI) myocardial infarction: Secondary | ICD-10-CM

## 2024-05-21 DIAGNOSIS — I252 Old myocardial infarction: Secondary | ICD-10-CM | POA: Diagnosis not present

## 2024-05-21 NOTE — Progress Notes (Signed)
 Subjective:   Timothy Grant is a 78 y.o. who presents for a Medicare Wellness preventive visit.  As a reminder, Annual Wellness Visits don't include a physical exam, and some assessments may be limited, especially if this visit is performed virtually. We may recommend an in-person follow-up visit with your provider if needed.  Visit Complete: Virtual I connected with  Timothy Grant on 05/21/24 by a audio enabled telemedicine application and verified that I am speaking with the correct person using two identifiers.  Patient Location: Home  Provider Location: Home Office  I discussed the limitations of evaluation and management by telemedicine. The patient expressed understanding and agreed to proceed.  Vital Signs: Because this visit was a virtual/telehealth visit, some criteria may be missing or patient reported. Any vitals not documented were not able to be obtained and vitals that have been documented are patient reported.    Persons Participating in Visit: Patient.  AWV Questionnaire: No: Patient Medicare AWV questionnaire was not completed prior to this visit.  Cardiac Risk Factors include: advanced age (>55men, >63 women);male gender     Objective:    Today's Vitals   05/21/24 1304 05/21/24 1305  Weight: 221 lb 8 oz (100.5 kg)   Height: 6' (1.829 m)   PainSc:  0-No pain   Body mass index is 30.04 kg/m.     05/21/2024    1:12 PM 03/25/2024    1:30 PM 03/24/2024   11:52 PM 03/16/2024   10:59 AM 11/17/2023   11:11 AM 08/18/2023   11:29 AM 07/14/2023   10:00 AM  Advanced Directives  Does Patient Have a Medical Advance Directive? Yes Yes Yes Yes Yes Yes Yes  Type of Estate agent of [place];Living will Healthcare Power of [place];Living will Living will;Healthcare Power of [employer] Power of [place];Living will Healthcare Power of [place];Living will Healthcare Power of [place];Living will Healthcare Power of [place];Living will   Does patient want to make changes to medical advance directive?  No - Patient declined   No - Patient declined No - Patient declined No - Patient declined  Copy of Healthcare Power of Attorney in Chart? No - copy requested No - copy requested  No - copy requested No - copy requested No - copy requested No - copy requested    Current Medications (verified) Outpatient Encounter Medications as of 05/21/2024  Medication Sig   acetaminophen  (TYLENOL ) 500 MG tablet Take 1,000 mg by mouth daily as needed for headache.   amLODipine  (NORVASC ) 2.5 MG tablet Take 1 tablet (2.5 mg total) by mouth daily.   aspirin  EC 81 MG tablet Take 1 tablet (81 mg total) by mouth daily. Swallow whole.   Cetirizine HCl 10 MG CAPS Take 10 mg by mouth daily.   Cholecalciferol (VITAMIN D3) 125 MCG (5000 UT) CAPS Take 5,000 Units by mouth daily.   clopidogrel  (PLAVIX ) 75 MG tablet Take 1 tablet (75 mg total) by mouth daily.   cyanocobalamin  (VITAMIN B12) 1000 MCG tablet Take 1,000 mcg by mouth daily.   cyclobenzaprine  (FLEXERIL ) 10 MG tablet Take 1 tablet (10 mg total) by mouth 3 (three) times daily as needed for muscle spasms. (Patient taking differently: Take 10 mg by mouth at bedtime as needed for muscle spasms.)   ezetimibe  (ZETIA ) 10 MG tablet Take 1 tablet (10 mg total) by mouth daily.   folic acid  (FOLVITE ) 1 MG tablet TAKE 1 TABLET BY MOUTH DAILY   Glucosamine HCl 1000 MG TABS Take 1 tablet by mouth daily.  isosorbide  mononitrate (IMDUR ) 30 MG 24 hr tablet Take 1 tablet (30 mg total) by mouth daily.   levothyroxine  (SYNTHROID ) 137 MCG tablet Take 1 tablet by mouth daily on Monday, Wednesday, Friday.   levothyroxine  (SYNTHROID ) 150 MCG tablet TAKE 1 TABLET BY MOUTH ON TUESDAY, THURSDAY, SATURDAY,AND SUNDAY   Multiple Vitamin (MULTIVITAMIN) capsule Take 1 capsule by mouth daily. Centrum Senior   nitroGLYCERIN  (NITROSTAT ) 0.4 MG SL tablet Place 1 tablet (0.4 mg total) under the tongue every 5 (five) minutes as needed  for chest pain (up to 3 doses).   Omega-3 Fatty Acids (FISH OIL) 1200 MG CAPS Take 1,200 mg by mouth daily.   rosuvastatin  (CRESTOR ) 10 MG tablet TAKE 1 TABLET BY MOUTH DAILY (Patient taking differently: Take 10 mg by mouth at bedtime.)   No facility-administered encounter medications on file as of 05/21/2024.    Allergies (verified) Patient has no known allergies.   History: Past Medical History:  Diagnosis Date   Adenomatous polyps    Allergy    seasonal   Arthritis    Cardiomegaly    Cataract    Cirrhosis [facility])    ED (erectile dysfunction)    Gallbladder polyp    Heart attack (HCC)    Hepatitis A    Hyperlipidemia    Hypothyroidism    Non-traumatic rhabdomyolysis 02/21/2019   Thrombocytopenia (HCC)    Varicocele 02/01/2011   Left testicle   Past Surgical History:  Procedure Laterality Date   CHOLECYSTECTOMY N/A 01/30/2021   Procedure: LAPAROSCOPIC CHOLECYSTECTOMY;  Surgeon: Lujean Sake, MD;  Location: [facility] ORS;  Service: General;  Laterality: N/A;  ROOM 1 STARTING AT 01:00PM   COLONOSCOPY  2014   EYE SURGERY Right 1956   EYE SURGERY Left 1963   LEFT HEART CATH AND CORONARY ANGIOGRAPHY N/A 03/26/2024   Procedure: LEFT HEART CATH AND CORONARY ANGIOGRAPHY;  Surgeon: Sammy Crisp, MD;  Location: [facility] INV[facility]IVE [facility];  Service: Cardiovascular;  Laterality: N/A;   LIVER BIOPSY     POLYPECTOMY     TONSILLECTOMY     Family History  Problem Relation Age of Onset   Alzheimer's disease Mother    Arthritis Mother    Dementia Mother    Stroke Mother    Heart disease Father    Uterine cancer Sister    Hyperlipidemia Brother    Stroke Brother    Breast cancer Brother    Colon polyps Brother    Lung cancer Brother    Heart disease Paternal Grandmother    Colon cancer Neg Hx    Esophageal cancer Neg Hx    Rectal cancer Neg Hx    Stomach cancer Neg Hx    Social History   Socioeconomic History   Marital status: Married    Spouse name: Not on file   Number of  children: 2   Years of education: Not on file   Highest education level: Bachelor's degree (e.g., BA, AB, BS)  Occupational History   Occupation: retired  Tobacco Use   Smoking status: Former    Types: Cigarettes, Cigars   Smokeless tobacco: Never   Tobacco comments:    quit 1975  Vaping Use   Vaping status: Never Used  Substance and Sexual Activity   Alcohol use: Not Currently    Alcohol/week: 6.0 standard drinks of alcohol    Types: 6 Cans of beer per week    Comment: Socially   Drug use: No   Sexual activity: Yes  Other Topics Concern  Not on file  Social History Narrative   Not on file   Social Drivers of Health   Financial Resource Strain: Low Risk  (05/21/2024)   Overall Financial Resource Strain (CARDIA)    Difficulty of Paying Living Expenses: Not hard at all  Food Insecurity: No Food Insecurity (05/21/2024)   Hunger Vital Sign    Worried About Running Out of Food in the Last Year: Never true    Ran Out of Food in the Last Year: Never true  Transportation Needs: No Transportation Needs (05/21/2024)   PRAPARE - Administrator, Civil Service (Medical): No    Lack of Transportation (Non-Medical): No  Physical Activity: Sufficiently Active (05/21/2024)   Exercise Vital Sign    Days of Exercise per Week: 7 days    Minutes of Exercise per Session: 30 min  Recent Concern: Physical Activity - Insufficiently Active (05/18/2024)   Exercise Vital Sign    Days of Exercise per Week: 7 days    Minutes of Exercise per Session: 20 min  Stress: No Stress Concern Present (05/21/2024)   [employer] of Occupational Health - Occupational Stress Questionnaire    Feeling of Stress: Not at all  Social Connections: Moderately Isolated (05/21/2024)   Social Connection and Isolation Panel    Frequency of Communication with Friends and Family: More than three times a week    Frequency of Social Gatherings with Friends and Family: More than three times a week    Attends  Religious Services: Never    Database administrator or Organizations: No    Attends Engineer, structural: Never    Marital Status: Married    Tobacco Counseling Counseling given: Not Answered Tobacco comments: quit 1975    Clinical Intake:  Pre-visit preparation completed: Yes  Pain : No/denies pain Pain Score: 0-No pain     BMI - recorded: 30.04 Nutritional Status: BMI > 30  Obese Nutritional Risks: None Diabetes: No  No results found for: HGBA1C   How often do you need to have someone help you when you read instructions, pamphlets, or other written materials from your doctor or pharmacy?: 1 - Never  Interpreter Needed?: No  Information entered by :: Farris Hong LPN   Activities of Daily Living     05/21/2024    1:11 PM 03/25/2024    1:30 PM  In your present state of health, do you have any difficulty performing the following activities:  Hearing? 0 0  Vision? 0 0  Difficulty concentrating or making decisions? 0 0  Walking or climbing stairs? 0   Dressing or bathing? 0   Doing errands, shopping? 0   Preparing Food and eating ? N   Using the Toilet? N   In the past six months, have you accidently leaked urine? N   Do you have problems with loss of bowel control? N   Managing your Medications? N   Managing your Finances? N   Housekeeping or managing your Housekeeping? N     Patient Care Team: Luevenia Saha, MD as PCP - General (Family Medicine) Sw[place], Peter M, MD as PCP - Cardiology (Cardiology) Evalene Hilda, [place] (Ophthalmology) Lyanne Sample, MD as Consulting Physician (Orthopedic Surgery) [place] (Dentistry) Lindle Rhea, MD (Inactive) as Consulting Physician (Gastroenterology) Florina Husbands, MD (Inactive) as Consulting Physician (Hematology) Rolando Cliche, [facility] as Pharmacist (Pharmacist)  I have updated your Care Teams any recent Medical Services you may have received from other providers in the past year.  Assessment:   This  is a routine wellness examination for Timothy Grant.  Hearing/Vision screen Hearing Screening - Comments:: Denies hearing difficulties   Vision Screening - Comments:: Wears rx glasses - up to date with routine eye exams with  Deferred   Goals Addressed               This Visit's Progress     Increase physical activity (pt-stated)        Remain active.       Depression Screen     05/21/2024    1:10 PM 05/04/2024   11:47 AM 02/09/2024    9:58 AM 05/16/2023    1:31 PM 02/07/2023    2:25 PM 03/23/2022   11:51 AM 05/18/2021    9:01 AM  PHQ 2/9 Scores  PHQ - 2 Score 0 0 0 0 0 0 0  PHQ- 9 Score 0 2         Fall Risk     05/21/2024    1:12 PM 05/21/2024   10:52 AM 05/18/2024   10:24 AM 05/16/2024   10:36 AM 05/14/2024   11:10 AM  Fall Risk   Falls in the past year? 1 1 1 1 1   Number falls in past yr: 0 0 0 0 0  Injury with Fall? 0 0 0 0 0  Risk for fall due to : No Fall Risks History of fall(s);Impaired balance/gait History of fall(s);Impaired balance/gait History of fall(s);Impaired balance/gait History of fall(s);Impaired balance/gait  Follow up Falls evaluation completed Falls evaluation completed Falls evaluation completed Falls evaluation completed Falls evaluation completed    MEDICARE RISK AT HOME:  Medicare Risk at Home Any stairs in or around the home?: No If so, are there any without handrails?: No Home free of loose throw rugs in walkways, pet beds, electrical cords, etc?: Yes Adequate lighting in your home to reduce risk of falls?: Yes Life alert?: No Use of a cane, walker or w/c?: No Grab bars in the bathroom?: Yes Shower chair or bench in shower?: Yes Elevated toilet seat or a handicapped toilet?: No  TIMED UP AND GO:  Was the test performed?  No  Cognitive Function: 6CIT completed    03/22/2018   10:18 AM  MMSE - Mini Mental State Exam  Orientation to time 5  Orientation to Place 5  Registration 3  Attention/ Calculation 5  Recall 3  Language- name 2  objects 2  Language- repeat 1  Language- follow 3 step command 3  Language- read & follow direction 1  Write a sentence 1  Copy design 1  Total score 30        05/21/2024    1:12 PM 05/16/2023    1:33 PM 03/23/2022   11:55 AM 09/25/2020   10:56 AM 08/22/2019   10:36 AM  6CIT Screen  What Year? 0 points 0 points 0 points 0 points 0 points  What month? 0 points 0 points 0 points 0 points 0 points  What time? 0 points 0 points 0 points  0 points  Count back from 20 0 points 0 points 0 points 0 points 0 points  Months in reverse 0 points 0 points 0 points 0 points 0 points  Repeat phrase 0 points 0 points 0 points 0 points 0 points  Total Score 0 points 0 points 0 points  0 points    Immunizations Immunization History  Administered Date(s) Administered   Fluad Quad(high Dose 65+) 08/14/2019, 09/04/2021   Fluad Trivalent(High Dose  65+) 09/20/2023   Hepatitis B, ADULT 08/06/2009   Hepatitis B, PED/ADOLESCENT 08/06/2009   Influenza Split 08/07/2011, 08/28/2013   Influenza, High Dose Seasonal PF 09/26/2017   Influenza, Quadrivalent, Recombinant, Inj, Pf 09/06/2016   Influenza, Seasonal, Injecte, Preservative Fre 09/04/2014, 09/09/2015   Influenza,inj,Quad PF,6+ Mos 09/04/2014, 09/09/2015   Influenza,trivalent, recombinat, inj, PF 08/07/2011, 08/09/2012, 08/28/2013   Influenza-Unspecified 08/09/2012, 09/22/2018, 09/19/2020, 09/02/2022   Moderna Covid-19 Vaccine Bivalent Booster 23yrs & up 12/10/2021   Moderna Sars-Covid-2 Vaccination 03/06/2020, 04/03/2020, 09/30/2020, 05/14/2021   Pneumococcal Conjugate-13 11/04/2014   Pneumococcal Polysaccharide-23 02/05/2011   Rotavirus,unspecified  11/11/2023   Tdap 02/05/2011   Zoster Recombinant(Shingrix ) 07/15/2018, 09/11/2018   Zoster, Live 02/05/2011    Screening Tests Health Maintenance  Topic Date Due   DTaP/Tdap/Td (2 - Td or Tdap) 02/04/2021   COVID-19 Vaccine (6 - 2024-25 season) 08/07/2023   INFLUENZA VACCINE  07/06/2024    Medicare Annual Wellness (AWV)  05/21/2025   Pneumococcal Vaccine: 50+ Years  Completed   Hepatitis C Screening  Completed   Zoster Vaccines- Shingrix   Completed   HPV VACCINES  Aged Out   Meningococcal B Vaccine  Aged Out   Colonoscopy  Discontinued    Health Maintenance  Health Maintenance Due  Topic Date Due   DTaP/Tdap/Td (2 - Td or Tdap) 02/04/2021   COVID-19 Vaccine (6 - 2024-25 season) 08/07/2023   Health Maintenance Items Addressed:   Additional Screening:  Vision Screening: Recommended annual ophthalmology exams for early detection of glaucoma and other disorders of the eye. Would you like a referral to an eye doctor? No    Dental Screening: Recommended annual dental exams for proper oral hygiene  Community Resource Referral / Chronic Care Management: CRR required this visit?  No   CCM required this visit?  No   Plan:    I have personally reviewed and noted the following in the patient's chart:   Medical and social history Use of alcohol, tobacco or illicit drugs  Current medications and supplements including opioid prescriptions. Patient is not currently taking opioid prescriptions. Functional ability and status Nutritional status Physical activity Advanced directives List of other physicians Hospitalizations, surgeries, and ER visits in previous 12 months Vitals Screenings to include cognitive, depression, and falls Referrals and appointments  In addition, I have reviewed and discussed with patient certain preventive protocols, quality metrics, and best practice recommendations. A written personalized care plan for preventive services as well as general preventive health recommendations were provided to patient.   Dewayne Ford, LPN   [id number]   After Visit Summary: (MyChart) Due to this being a telephonic visit, the after visit summary with patients personalized plan was offered to patient via MyChart   Notes: Nothing significant to report at  this time.

## 2024-05-21 NOTE — Patient Instructions (Addendum)
 [name] , Thank you for taking time out of your busy schedule to complete your Annual Wellness Visit with me. I enjoyed our conversation and look forward to speaking with you again next year. I, as well as your care team,  appreciate your ongoing commitment to your health goals. Please review the following plan we discussed and let me know if I can assist you in the future. Your Game plan/ To Do List    Referrals: If you haven't heard from the office you've been referred to, please reach out to them at the phone provided.   Follow up Visits: Next Medicare AWV with our clinical staff: 05/27/25 @ 1p   Have you seen your provider in the last 6 months (3 months if uncontrolled diabetes)?  Next Office Visit with your provider: 02/11/25 @ 10:30a  Clinician Recommendations:  Aim for 30 minutes of exercise or brisk walking, 6-8 glasses of water, and 5 servings of fruits and vegetables each day.       This is a list of the screening recommended for you and due dates:  Health Maintenance  Topic Date Due   DTaP/Tdap/Td vaccine (2 - Td or Tdap) 02/04/2021   COVID-19 Vaccine (6 - 2024-25 season) 08/07/2023   Flu Shot  07/06/2024   Medicare Annual Wellness Visit  05/21/2025   Pneumococcal Vaccine for age over 73  Completed   Hepatitis C Screening  Completed   Zoster (Shingles) Vaccine  Completed   HPV Vaccine  Aged Out   Meningitis B Vaccine  Aged Out   Colon Cancer Screening  Discontinued    Advanced directives: (Copy Requested) Please bring a copy of your health care power of attorney and living will to the office to be added to your chart at your convenience. You can mail to [facility] [address] [place], [place] [address] or email to [email or web address] Advance Care Planning is important because it:  [x]  Makes sure you receive the medical care that is consistent with your values, goals, and preferences  [x]  It provides guidance to your family and loved ones  and reduces their decisional burden about whether or not they are making the right decisions based on your wishes.  Follow the link provided in your after visit summary or read over the paperwork we have mailed to you to help you started getting your Advance Directives in place. If you need assistance in completing these, please reach out to us  so that we can help you!  See attachments for Preventive Care and Fall Prevention Tips.

## 2024-05-22 NOTE — Progress Notes (Signed)
 Cardiac Individual Treatment Plan  Patient Details  Name: Timothy Grant MRN: [id number] Date of Birth: 1946-12-03 Referring Provider:   Flowsheet Row INTENSIVE CARDIAC REHAB ORIENT from 05/04/2024 in [facility], V[facility], & Lung [facility]  Referring Provider Peter Swazil[place], MD    Initial Encounter Date:  Flowsheet Row INTENSIVE CARDIAC REHAB ORIENT from 05/04/2024 in [facility] for Heart, Vascular, & Lung Health  Date 05/04/24    Visit Diagnosis: 03/09/24 NSTEMI (non-ST elevated myocardial infarction) [facility])  Patient's Home Medications on Admission:  Current Outpatient Medications:    acetaminophen  (TYLENOL ) 500 MG tablet, Take 1,000 mg by mouth daily as needed for headache., Disp: , Rfl:    amLODipine  (NORVASC ) 2.5 MG tablet, Take 1 tablet (2.5 mg total) by mouth daily., Disp: 30 tablet, Rfl: 5   aspirin  EC 81 MG tablet, Take 1 tablet (81 mg total) by mouth daily. Swallow whole., Disp: 30 tablet, Rfl: 11   Cetirizine HCl 10 MG CAPS, Take 10 mg by mouth daily., Disp: , Rfl:    Cholecalciferol (VITAMIN D3) 125 MCG (5000 UT) CAPS, Take 5,000 Units by mouth daily., Disp: , Rfl:    clopidogrel  (PLAVIX ) 75 MG tablet, Take 1 tablet (75 mg total) by mouth daily., Disp: 30 tablet, Rfl: 11   cyanocobalamin  (VITAMIN B12) 1000 MCG tablet, Take 1,000 mcg by mouth daily., Disp: , Rfl:    cyclobenzaprine  (FLEXERIL ) 10 MG tablet, Take 1 tablet (10 mg total) by mouth 3 (three) times daily as needed for muscle spasms. (Patient taking differently: Take 10 mg by mouth at bedtime as needed for muscle spasms.), Disp: 90 tablet, Rfl: 3   ezetimibe  (ZETIA ) 10 MG tablet, Take 1 tablet (10 mg total) by mouth daily., Disp: 30 tablet, Rfl: 5   folic acid  (FOLVITE ) 1 MG tablet, TAKE 1 TABLET BY MOUTH DAILY, Disp: 90 tablet, Rfl: 1   Glucosamine HCl 1000 MG TABS, Take 1 tablet by mouth daily., Disp: , Rfl:    isosorbide  mononitrate (IMDUR ) 30 MG 24 hr tablet,  Take 1 tablet (30 mg total) by mouth daily., Disp: 30 tablet, Rfl: 5   levothyroxine  (SYNTHROID ) 137 MCG tablet, Take 1 tablet by mouth daily on Monday, Wednesday, Friday., Disp: 90 tablet, Rfl: 3   levothyroxine  (SYNTHROID ) 150 MCG tablet, TAKE 1 TABLET BY MOUTH ON TUESDAY, THURSDAY, SATURDAY,AND SUNDAY, Disp: 90 tablet, Rfl: 3   Multiple Vitamin (MULTIVITAMIN) capsule, Take 1 capsule by mouth daily. Centrum Senior, Disp: , Rfl:    nitroGLYCERIN  (NITROSTAT ) 0.4 MG SL tablet, Place 1 tablet (0.4 mg total) under the tongue every 5 (five) minutes as needed for chest pain (up to 3 doses)., Disp: 25 tablet, Rfl: 3   Omega-3 Fatty Acids (FISH OIL) 1200 MG CAPS, Take 1,200 mg by mouth daily., Disp: , Rfl:    rosuvastatin  (CRESTOR ) 10 MG tablet, TAKE 1 TABLET BY MOUTH DAILY (Patient taking differently: Take 10 mg by mouth at bedtime.), Disp: 90 tablet, Rfl: 3  Past Medical History: Past Medical History:  Diagnosis Date   Adenomatous polyps    Allergy    seasonal   Arthritis    Cardiomegaly    Cataract    Cirrhosis [facility])    ED (erectile dysfunction)    Gallbladder polyp    Heart attack (HCC)    Hepatitis A    Hyperlipidemia    Hypothyroidism    Non-traumatic rhabdomyolysis 02/21/2019   Thrombocytopenia (HCC)    Varicocele 02/01/2011   Left testicle  Tobacco Use: Social History   Tobacco Use  Smoking Status Former   Types: Cigarettes, Cigars  Smokeless Tobacco Never  Tobacco Comments   quit 1975    Labs: Review Flowsheet  More data exists      Latest Ref Rng & Units 08/25/2020 05/18/2021 02/02/2024 03/25/2024 05/09/2024  Labs for ITP Cardiac and Pulmonary Rehab  Cholestrol 100 - 199 mg/dL 846  962  952  841  324   LDL (calc) 0 - 99 mg/dL 82  83  82  85  63   HDL-C >39 mg/dL 59  40.10  27.25  52  48   Trlycerides 0 - 149 mg/dL 88  36.6  44.0  37  60     Capillary Blood Glucose: No results found for: GLUCAP   Exercise Target Goals: Exercise Program Goal: Individual  exercise prescription set using results from initial 6 min walk test and THRR while considering  patient's activity barriers and safety.   Exercise Prescription Goal: Initial exercise prescription builds to 30-45 minutes a day of aerobic activity, 2-3 days per week.  Home exercise guidelines will be given to patient during program as part of exercise prescription that the participant will acknowledge.  Activity Barriers & Risk Stratification:  Activity Barriers & Cardiac Risk Stratification - 05/04/24 1123       Activity Barriers & Cardiac Risk Stratification   Activity Barriers Arthritis;History of Falls;Joint Problems    Cardiac Risk Stratification High   <5 METs on         6 Minute Walk:  6 Minute Walk     Row Name 05/04/24 1210         6 Minute Walk   Phase Initial     Distance 1500 feet     Walk Time 6 minutes     # of Rest Breaks 0     MPH 2.84     METS 2.59     RPE 9     Perceived Dyspnea  0     VO2 Peak 9.05     Symptoms Yes (comment)     Comments pt stated he felt swimmy when turning to sit down at completion of , BP 122/62. Resolved immediately with sitting. After sitting for a few minutes asked pt to stand, lightheaded/swimmy did not return. no other s/sx.     Resting HR 73 bpm     Resting BP 102/60     Resting Oxygen Saturation  97 %     Exercise Oxygen Saturation  during 6 min walk 97 %     Max Ex. HR 101 bpm     Max Ex. BP 122/62     2 Minute Post BP 114/60        Oxygen Initial Assessment:   Oxygen Re-Evaluation:   Oxygen Discharge (Final Oxygen Re-Evaluation):   Initial Exercise Prescription:  Initial Exercise Prescription - 05/04/24 1100       Date of Initial Exercise RX and Referring Provider   Date 05/04/24    Referring Provider Peter Swazil[place], MD    Expected Discharge Date 07/25/24      Bike   Level 1    Watts 40    Minutes 15    METs 2      NuStep   Level 2    SPM 70    Minutes 15    METs 2.2      Prescription  Details   Frequency (times per week) 3  Duration Progress to 30 minutes of continuous aerobic without signs/symptoms of physical distress      Intensity   THRR 40-80% of Max Heartrate 57-114    Ratings of Perceived Exertion 11-13    Perceived Dyspnea 0-4      Progression   Progression Continue progressive overload as per policy without signs/symptoms or physical distress.      Resistance Training   Training Prescription Yes    Weight 3    Reps 10-15          Perform Capillary Blood Glucose checks as needed.  Exercise Prescription Changes:   Exercise Prescription Changes     Row Name 05/07/24 1039 05/14/24 1038           Response to Exercise   Blood Pressure (Admit) 102/60 110/62      Blood Pressure (Exercise) 152/76 158/78      Blood Pressure (Exit) 100/62 104/72      Heart Rate (Admit) 78 bpm 69 bpm      Heart Rate (Exercise) 120 bpm 120 bpm      Heart Rate (Exit) 86 bpm 78 bpm      Symptoms None None      Comments Off to a good start with exercise. --      Duration Continue with 30 min of aerobic exercise without signs/symptoms of physical distress. Continue with 30 min of aerobic exercise without signs/symptoms of physical distress.      Intensity THRR unchanged THRR unchanged        Progression   Progression Continue to progress workloads to maintain intensity without signs/symptoms of physical distress. Continue to progress workloads to maintain intensity without signs/symptoms of physical distress.      Average METs 2.7 2.9        Resistance Training   Training Prescription Yes Yes      Weight 3 lbs 3 lbs      Reps 10-15 10-15      Time 5 Minutes 5 Minutes        Interval Training   Interval Training No No        Bike   Level 1 1      Watts 21 22      Minutes 15 15      METs 2.6 2.6        NuStep   Level 1 1      SPM 100 118      Minutes 15 15      METs 2.8 3.3         Exercise Comments:   Exercise Comments     Row Name 05/07/24 1139  05/18/24 1101         Exercise Comments Jeff tolerated low intensity exercise well without symptoms. Oriented him to the stretching and exercise routine. Stretching handout given. Reviewed METs and goals with Timothy Grant.         Exercise Goals and Review:   Exercise Goals     Row Name 05/04/24 1124             Exercise Goals   Increase Physical Activity Yes       Intervention Provide advice, education, support and counseling about physical activity/exercise needs.;Develop an individualized exercise prescription for aerobic and resistive training based on initial evaluation findings, risk stratification, comorbidities and participant's personal goals.       Expected Outcomes Short Term: Attend rehab on a regular basis to increase amount of physical activity.;Long Term: Exercising regularly at  least 3-5 days a week.;Long Term: Add in home exercise to make exercise part of routine and to increase amount of physical activity.       Increase Strength and Stamina Yes       Intervention Provide advice, education, support and counseling about physical activity/exercise needs.;Develop an individualized exercise prescription for aerobic and resistive training based on initial evaluation findings, risk stratification, comorbidities and participant's personal goals.       Expected Outcomes Short Term: Increase workloads from initial exercise prescription for resistance, speed, and METs.;Short Term: Perform resistance training exercises routinely during rehab and add in resistance training at home;Long Term: Improve cardiorespiratory fitness, muscular endurance and strength as measured by increased METs and functional capacity ( )       Able to understand and use rate of perceived exertion (RPE) scale Yes       Intervention Provide education and explanation on how to use RPE scale       Expected Outcomes Short Term: Able to use RPE daily in rehab to express subjective intensity level;Long Term:  Able to  use RPE to guide intensity level when exercising independently       Knowledge and understanding of Target Heart Rate Range (THRR) Yes       Intervention Provide education and explanation of THRR including how the numbers were predicted and where they are located for reference       Expected Outcomes Short Term: Able to state/look up THRR;Short Term: Able to use daily as guideline for intensity in rehab;Long Term: Able to use THRR to govern intensity when exercising independently       Understanding of Exercise Prescription Yes       Intervention Provide education, explanation, and written materials on patient's individual exercise prescription       Expected Outcomes Short Term: Able to explain program exercise prescription;Long Term: Able to explain home exercise prescription to exercise independently          Exercise Goals Re-Evaluation :  Exercise Goals Re-Evaluation     Row Name 05/07/24 1139 05/18/24 1101           Exercise Goal Re-Evaluation   Exercise Goals Review Increase Physical Activity;Increase Strength and Stamina;Able to understand and use rate of perceived exertion (RPE) scale Increase Physical Activity;Increase Strength and Stamina;Able to understand and use rate of perceived exertion (RPE) scale;Knowledge and understanding of Target Heart Rate Range (THRR)      Comments Timothy Grant was able to understand and use RPE scale appropriately. Timothy Grant is walking 25 minutes twice/ day as his mode of home exercise. He is golfing and gardening without issue. Reviewed THRR with him.      Expected Outcomes Progress workloads as tolerated to help increase cardiorespiratory fitness. Continue daily walking in addition to exercise at cardiac rehab.         Discharge Exercise Prescription (Final Exercise Prescription Changes):  Exercise Prescription Changes - 05/14/24 1038       Response to Exercise   Blood Pressure (Admit) 110/62    Blood Pressure (Exercise) 158/78    Blood Pressure (Exit)  104/72    Heart Rate (Admit) 69 bpm    Heart Rate (Exercise) 120 bpm    Heart Rate (Exit) 78 bpm    Symptoms None    Duration Continue with 30 min of aerobic exercise without signs/symptoms of physical distress.    Intensity THRR unchanged      Progression   Progression Continue to progress workloads to maintain intensity  without signs/symptoms of physical distress.    Average METs 2.9      Resistance Training   Training Prescription Yes    Weight 3 lbs    Reps 10-15    Time 5 Minutes      Interval Training   Interval Training No      Bike   Level 1    Watts 22    Minutes 15    METs 2.6      NuStep   Level 1    SPM 118    Minutes 15    METs 3.3          Nutrition:  Target Goals: Understanding of nutrition guidelines, daily intake of sodium 1500mg , cholesterol 200mg , calories 30% from fat and 7% or less from saturated fats, daily to have 5 or more servings of fruits and vegetables.  Biometrics:  Pre Biometrics - 05/04/24 1121       Pre Biometrics   Waist Circumference 43.5 inches    Hip Circumference 43 inches    Waist to Hip Ratio 1.01 %    Triceps Skinfold 26 mm    % Body Fat 32.7 %    Grip Strength 34 kg    Flexibility 13.5 in    Single Leg Stand 2 seconds           Nutrition Therapy Plan and Nutrition Goals:  Nutrition Therapy & Goals - 05/07/24 1442       Nutrition Therapy   Diet heart healthy diet    Drug/Food Interactions Statins/Certain Fruits      Personal Nutrition Goals   Nutrition Goal Patient to identify strategies for reducing cardiovascular risk by attending the [employer] education and nutrition series weekly.    Personal Goal #2 Patient to improve diet quality by using the plate method as a guide for meal planning to include lean protein/plant protein, fruits, vegetables, whole grains, nonfat dairy as part of a well-balanced diet.    Comments Timothy Grant has medical history of coronary artery disease, NSTEMI, hyperlipidemia, pancytopenia,  thoracic aortic aneurysm, compensated cirrhosis secondary to prior acute hepatitis A.LDL is not at goal; per cardiology documentation at 04/11/24 follow-up, Rhabdomyolysis on higher dose Crestor . Currently on Crestor  10 mg and Zetia  10 mg. Goal to reduce LDL to 55 or less due to multivessel coronary artery disease. Discussed potential use of PCSK9 inhibitors if cholesterol goals are not met with current regimen. He continues regular follow-up with GI related to history of hepatitis A and cirrhosis. Patient will benefit from participation in intensive cardiac rehab for nutrition education, exercise, and lifestyle modification.      Intervention Plan   Intervention Prescribe, educate and counsel regarding individualized specific dietary modifications aiming towards targeted core components such as weight, hypertension, lipid management, diabetes, heart failure and other comorbidities.;Nutrition handout(s) given to patient.    Expected Outcomes Short Term Goal: Understand basic principles of dietary content, such as calories, fat, sodium, cholesterol and nutrients.;Long Term Goal: Adherence to prescribed nutrition plan.          Nutrition Assessments:  Nutrition Assessments - 05/08/24 1424       Rate Your Plate Scores   Pre Score 56         MEDIFICTS Score Key: >=70 Need to make dietary changes  40-70 Heart Healthy Diet <= 40 Therapeutic Level Cholesterol Diet   Flowsheet Row INTENSIVE CARDIAC REHAB from 05/07/2024 in [facility] Heart, Vascular, & Lung Health  Picture Your Plate Total  Score on Admission 56   Picture Your Plate Scores: <62 Unhealthy dietary pattern with much room for improvement. 41-50 Dietary pattern unlikely to meet recommendations for good health and room for improvement. 51-60 More healthful dietary pattern, with some room for improvement.  >60 Healthy dietary pattern, although there may be some specific behaviors that could be improved.     Nutrition Goals Re-Evaluation:  Nutrition Goals Re-Evaluation     Row Name 05/07/24 1442             Goals   Current Weight 228 lb 13.4 oz (103.8 kg)       Comment Lpa 111.8, LDL 85 (goal <55), HDL 52       Expected Outcome Timothy Grant has medical history of coronary artery disease, NSTEMI, hyperlipidemia, pancytopenia, thoracic aortic aneurysm, compensated cirrhosis secondary to prior acute hepatitis A.LDL is not at goal; per cardiology documentation at 04/11/24 follow-up, Rhabdomyolysis on higher dose Crestor . Currently on Crestor  10 mg and Zetia  10 mg. Goal to reduce LDL to 55 or less due to multivessel coronary artery disease. Discussed potential use of PCSK9 inhibitors if cholesterol goals are not met with current regimen. He continues regular follow-up with GI related to history of hepatitis A and cirrhosis. Patient will benefit from participation in intensive cardiac rehab for nutrition education, exercise, and lifestyle modification.          Nutrition Goals Re-Evaluation:  Nutrition Goals Re-Evaluation     Row Name 05/07/24 1442             Goals   Current Weight 228 lb 13.4 oz (103.8 kg)       Comment Lpa 111.8, LDL 85 (goal <55), HDL 52       Expected Outcome Timothy Grant has medical history of coronary artery disease, NSTEMI, hyperlipidemia, pancytopenia, thoracic aortic aneurysm, compensated cirrhosis secondary to prior acute hepatitis A.LDL is not at goal; per cardiology documentation at 04/11/24 follow-up, Rhabdomyolysis on higher dose Crestor . Currently on Crestor  10 mg and Zetia  10 mg. Goal to reduce LDL to 55 or less due to multivessel coronary artery disease. Discussed potential use of PCSK9 inhibitors if cholesterol goals are not met with current regimen. He continues regular follow-up with GI related to history of hepatitis A and cirrhosis. Patient will benefit from participation in intensive cardiac rehab for nutrition education, exercise, and lifestyle modification.           Nutrition Goals Discharge (Final Nutrition Goals Re-Evaluation):  Nutrition Goals Re-Evaluation - 05/07/24 1442       Goals   Current Weight 228 lb 13.4 oz (103.8 kg)    Comment Lpa 111.8, LDL 85 (goal <55), HDL 52    Expected Outcome Timothy Grant has medical history of coronary artery disease, NSTEMI, hyperlipidemia, pancytopenia, thoracic aortic aneurysm, compensated cirrhosis secondary to prior acute hepatitis A.LDL is not at goal; per cardiology documentation at 04/11/24 follow-up, Rhabdomyolysis on higher dose Crestor . Currently on Crestor  10 mg and Zetia  10 mg. Goal to reduce LDL to 55 or less due to multivessel coronary artery disease. Discussed potential use of PCSK9 inhibitors if cholesterol goals are not met with current regimen. He continues regular follow-up with GI related to history of hepatitis A and cirrhosis. Patient will benefit from participation in intensive cardiac rehab for nutrition education, exercise, and lifestyle modification.          Psychosocial: Target Goals: Acknowledge presence or absence of significant depression and/or stress, maximize coping skills, provide positive support system. Participant is able to verbalize  types and ability to use techniques and skills needed for reducing stress and depression.  Initial Review & Psychosocial Screening:  Initial Psych Review & Screening - 05/04/24 1124       Initial Review   Current issues with None Identified      Family Dynamics   Good Support System? Yes   wife for support     Barriers   Psychosocial barriers to participate in program There are no identifiable barriers or psychosocial needs.      Screening Interventions   Interventions Encouraged to exercise;Provide feedback about the scores to participant    Expected Outcomes Short Term goal: Identification and review with participant of any Quality of Life or Depression concerns found by scoring the questionnaire.;Long Term goal: The participant improves  quality of Life and PHQ9 Scores as seen by post scores and/or verbalization of changes          Quality of Life Scores:  Quality of Life - 05/04/24 1210       Quality of Life   Select Quality of Life      Quality of Life Scores   Health/Function Pre 25.2 %    Socioeconomic Pre 25.43 %    Psych/Spiritual Pre 22.93 %    Family Pre 24 %    GLOBAL Pre 24.6 %         Scores of 19 and below usually indicate a poorer quality of life in these areas.  A difference of  2-3 points is a clinically meaningful difference.  A difference of 2-3 points in the total score of the Quality of Life Index has been associated with significant improvement in overall quality of life, self-image, physical symptoms, and general health in studies assessing change in quality of life.  PHQ-9: Review Flowsheet  More data exists      05/21/2024 05/04/2024 02/09/2024 05/16/2023 02/07/2023  Depression screen PHQ 2/9  Decreased Interest 0 0 0 0 0  Down, Depressed, Hopeless 0 0 0 0 0  PHQ - 2 Score 0 0 0 0 0  Altered sleeping 0 0 - - -  Tired, decreased energy 0 0 - - -  Change in appetite 0 1 - - -  Feeling bad or failure about yourself  0 1 - - -  Trouble concentrating 0 0 - - -  Moving slowly or fidgety/restless 0 0 - - -  Suicidal thoughts 0 0 - - -  PHQ-9 Score 0 2 - - -   Interpretation of Total Score  Total Score Depression Severity:  1-4 = Minimal depression, 5-9 = Mild depression, 10-14 = Moderate depression, 15-19 = Moderately severe depression, 20-27 = Severe depression   Psychosocial Evaluation and Intervention:   Psychosocial Re-Evaluation:  Psychosocial Re-Evaluation     Row Name 05/07/24 1714 05/18/24 0940           Psychosocial Re-Evaluation   Current issues with None Identified None Identified      Comments -- Reviewed PHQ9. Timothy Grant said that he had been overeating. Timothy Grant says he is now making better choices. Timothy Grant says he is feeling better since starting cardiac rehab.       Interventions Encouraged to attend Cardiac Rehabilitation for the exercise Encouraged to attend Cardiac Rehabilitation for the exercise      Continue Psychosocial Services  No Follow up required No Follow up required         Psychosocial Discharge (Final Psychosocial Re-Evaluation):  Psychosocial Re-Evaluation - 05/18/24 0940  Psychosocial Re-Evaluation   Current issues with None Identified    Comments Reviewed PHQ9. Timothy Grant said that he had been overeating. Timothy Grant says he is now making better choices. Timothy Grant says he is feeling better since starting cardiac rehab.    Interventions Encouraged to attend Cardiac Rehabilitation for the exercise    Continue Psychosocial Services  No Follow up required          Vocational Rehabilitation: Provide vocational rehab assistance to qualifying candidates.   Vocational Rehab Evaluation & Intervention:  Vocational Rehab - 05/04/24 1125       Initial Vocational Rehab Evaluation & Intervention   Assessment shows need for Vocational Rehabilitation No   retired         Education: Education Goals: Education classes will be provided on a weekly basis, covering required topics. Participant will state understanding/return demonstration of topics presented.    Education     Row Name 05/07/24 1300     Education   Cardiac Education Topics [employer]   Geographical information systems officer Exercise   Exercise Workshop Exercise Basics: Building Your Action Plan   Instruction Review Code 1- Verbalizes Understanding   Class Start Time 1157   Class Stop Time 1243   Class Time Calculation (min) 46 min    Row Name 05/09/24 1500     Education   Cardiac Education Topics [employer]   Customer service manager   Weekly Topic Efficiency Cooking - Meals in a Snap   Instruction Review Code 1- Verbalizes Understanding   Class Start Time 1145   Class Stop Time 1222   Class Time  Calculation (min) 37 min    Row Name 05/11/24 0900     Education   Cardiac Education Topics [employer]   Psychologist, forensic Exercise Education   Exercise Education Move It!   Instruction Review Code 1- Verbalizes Understanding   Class Start Time [phone]   Class Stop Time 0845   Class Time Calculation (min) 35 min    Row Name 05/14/24 1300     Education   Cardiac Education Topics [employer]   Glass blower/designer Nutrition   Nutrition Workshop Targeting Your Nutrition Priorities   Instruction Review Code 1- Verbalizes Understanding   Class Start Time 1150   Class Stop Time 1230   Class Time Calculation (min) 40 min    Row Name 05/16/24 1300     Education   Cardiac Education Topics [employer]   Orthoptist   Educator Dietitian   Weekly Topic One-Pot Wonders   Instruction Review Code 1- Verbalizes Understanding   Class Start Time 1145   Class Stop Time 1220   Class Time Calculation (min) 35 min    Row Name 05/18/24 1200     Education   Cardiac Education Topics [employer]in   Psychologist, forensic General Education   General Education Hypertension and Heart Disease   Instruction Review Code 1- Verbalizes Understanding   Class Start Time 1155   Class Stop Time 1232   Class Time Calculation (min) 37 min    Row Name 05/21/24 1400     Education  Cardiac Education Topics Pritikin   Geographical information systems officer Psychosocial   Psychosocial Workshop Focused Goals, Sustainable Changes   Instruction Review Code 1- Verbalizes Understanding   Class Start Time 1155   Class Stop Time 1230   Class Time Calculation (min) 35 min      Core Videos: Exercise    Move It!  Clinical staff conducted group or individual video education with verbal and  written material and guidebook.  Patient learns the recommended Pritikin exercise program. Exercise with the goal of living a long, healthy life. Some of the health benefits of exercise include controlled diabetes, healthier blood pressure levels, improved cholesterol levels, improved heart and lung capacity, improved sleep, and better body composition. Everyone should speak with their doctor before starting or changing an exercise routine.  Biomechanical Limitations Clinical staff conducted group or individual video education with verbal and written material and guidebook.  Patient learns how biomechanical limitations can impact exercise and how we can mitigate and possibly overcome limitations to have an impactful and balanced exercise routine.  Body Composition Clinical staff conducted group or individual video education with verbal and written material and guidebook.  Patient learns that body composition (ratio of muscle mass to fat mass) is a key component to assessing overall fitness, rather than body weight alone. Increased fat mass, especially visceral belly fat, can put us  at increased risk for metabolic syndrome, type 2 diabetes, heart disease, and even death. It is recommended to combine diet and exercise (cardiovascular and resistance training) to improve your body composition. Seek guidance from your physician and exercise physiologist before implementing an exercise routine.  Exercise Action Plan Clinical staff conducted group or individual video education with verbal and written material and guidebook.  Patient learns the recommended strategies to achieve and enjoy long-term exercise adherence, including variety, self-motivation, self-efficacy, and positive decision making. Benefits of exercise include fitness, good health, weight management, more energy, better sleep, less stress, and overall well-being.  Medical   Heart Disease Risk Reduction Clinical staff conducted group or  individual video education with verbal and written material and guidebook.  Patient learns our heart is our most vital organ as it circulates oxygen, nutrients, white blood cells, and hormones throughout the entire body, and carries waste away. Data supports a plant-based eating plan like the Pritikin Program for its effectiveness in slowing progression of and reversing heart disease. The video provides a number of recommendations to address heart disease.   Metabolic Syndrome and Belly Fat  Clinical staff conducted group or individual video education with verbal and written material and guidebook.  Patient learns what metabolic syndrome is, how it leads to heart disease, and how one can reverse it and keep it from coming back. You have metabolic syndrome if you have 3 of the following 5 criteria: abdominal obesity, high blood pressure, high triglycerides, low HDL cholesterol, and high blood sugar.  Hypertension and Heart Disease Clinical staff conducted group or individual video education with verbal and written material and guidebook.  Patient learns that high blood pressure, or hypertension, is very common in the [place] . Hypertension is largely due to excessive salt intake, but other important risk factors include being overweight, physical inactivity, drinking too much alcohol, smoking, and not eating enough potassium from fruits and vegetables. High blood pressure is a leading risk factor for heart attack, stroke, congestive heart failure, dementia, kidney failure, and premature death. Long-term effects of excessive salt  intake include stiffening of the arteries and thickening of heart muscle and organ damage. Recommendations include ways to reduce hypertension and the risk of heart disease.  Diseases of Our Time - Focusing on Diabetes Clinical staff conducted group or individual video education with verbal and written material and guidebook.  Patient learns why the best way to stop diseases  of our time is prevention, through food and other lifestyle changes. Medicine (such as prescription pills and surgeries) is often only a Band-Aid on the problem, not a long-term solution. Most common diseases of our time include obesity, type 2 diabetes, hypertension, heart disease, and cancer. The Pritikin Program is recommended and has been proven to help reduce, reverse, and/or prevent the damaging effects of metabolic syndrome.  Nutrition   Overview of the Pritikin Eating Plan  Clinical staff conducted group or individual video education with verbal and written material and guidebook.  Patient learns about the Pritikin Eating Plan for disease risk reduction. The Pritikin Eating Plan emphasizes a wide variety of unrefined, minimally-processed carbohydrates, like fruits, vegetables, whole grains, and legumes. Go, Caution, and Stop food choices are explained. Plant-based and lean animal proteins are emphasized. Rationale provided for low sodium intake for blood pressure control, low added sugars for blood sugar stabilization, and low added fats and oils for coronary artery disease risk reduction and weight management.  Calorie Density  Clinical staff conducted group or individual video education with verbal and written material and guidebook.  Patient learns about calorie density and how it impacts the Pritikin Eating Plan. Knowing the characteristics of the food you choose will help you decide whether those foods will lead to weight gain or weight loss, and whether you want to consume more or less of them. Weight loss is usually a side effect of the Pritikin Eating Plan because of its focus on low calorie-dense foods.  Label Reading  Clinical staff conducted group or individual video education with verbal and written material and guidebook.  Patient learns about the Pritikin recommended label reading guidelines and corresponding recommendations regarding calorie density, added sugars, sodium content,  and whole grains.  Dining Out - Part 1  Clinical staff conducted group or individual video education with verbal and written material and guidebook.  Patient learns that restaurant meals can be sabotaging because they can be so high in calories, fat, sodium, and/or sugar. Patient learns recommended strategies on how to positively address this and avoid unhealthy pitfalls.  Facts on Fats  Clinical staff conducted group or individual video education with verbal and written material and guidebook.  Patient learns that lifestyle modifications can be just as effective, if not more so, as many medications for lowering your risk of heart disease. A Pritikin lifestyle can help to reduce your risk of inflammation and atherosclerosis (cholesterol build-up, or plaque, in the artery walls). Lifestyle interventions such as dietary choices and physical activity address the cause of atherosclerosis. A review of the types of fats and their impact on blood cholesterol levels, along with dietary recommendations to reduce fat intake is also included.  Nutrition Action Plan  Clinical staff conducted group or individual video education with verbal and written material and guidebook.  Patient learns how to incorporate Pritikin recommendations into their lifestyle. Recommendations include planning and keeping personal health goals in mind as an important part of their success.  Healthy Mind-Set    Healthy Minds, Bodies, Hearts  Clinical staff conducted group or individual video education with verbal and written material and guidebook.  Patient  learns how to identify when they are stressed. Video will discuss the impact of that stress, as well as the many benefits of stress management. Patient will also be introduced to stress management techniques. The way we think, act, and feel has an impact on our hearts.  How Our Thoughts Can Heal Our Hearts  Clinical staff conducted group or individual video education with verbal  and written material and guidebook.  Patient learns that negative thoughts can cause depression and anxiety. This can result in negative lifestyle behavior and serious health problems. Cognitive behavioral therapy is an effective method to help control our thoughts in order to change and improve our emotional outlook.  Additional Videos:  Exercise    Improving Performance  Clinical staff conducted group or individual video education with verbal and written material and guidebook.  Patient learns to use a non-linear approach by alternating intensity levels and lengths of time spent exercising to help burn more calories and lose more body fat. Cardiovascular exercise helps improve heart health, metabolism, hormonal balance, blood sugar control, and recovery from fatigue. Resistance training improves strength, endurance, balance, coordination, reaction time, metabolism, and muscle mass. Flexibility exercise improves circulation, posture, and balance. Seek guidance from your physician and exercise physiologist before implementing an exercise routine and learn your capabilities and proper form for all exercise.  Introduction to Yoga  Clinical staff conducted group or individual video education with verbal and written material and guidebook.  Patient learns about yoga, a discipline of the coming together of mind, breath, and body. The benefits of yoga include improved flexibility, improved range of motion, better posture and core strength, increased lung function, weight loss, and positive self-image. Yoga's heart health benefits include lowered blood pressure, healthier heart rate, decreased cholesterol and triglyceride levels, improved immune function, and reduced stress. Seek guidance from your physician and exercise physiologist before implementing an exercise routine and learn your capabilities and proper form for all exercise.  Medical   Aging: Enhancing Your Quality of Life  Clinical staff conducted  group or individual video education with verbal and written material and guidebook.  Patient learns key strategies and recommendations to stay in good physical health and enhance quality of life, such as prevention strategies, having an advocate, securing a Health Care Proxy and Power of Attorney, and keeping a list of medications and system for tracking them. It also discusses how to avoid risk for bone loss.  Biology of Weight Control  Clinical staff conducted group or individual video education with verbal and written material and guidebook.  Patient learns that weight gain occurs because we consume more calories than we burn (eating more, moving less). Even if your body weight is normal, you may have higher ratios of fat compared to muscle mass. Too much body fat puts you at increased risk for cardiovascular disease, heart attack, stroke, type 2 diabetes, and obesity-related cancers. In addition to exercise, following the Pritikin Eating Plan can help reduce your risk.  Decoding Lab Results  Clinical staff conducted group or individual video education with verbal and written material and guidebook.  Patient learns that lab test reflects one measurement whose values change over time and are influenced by many factors, including medication, stress, sleep, exercise, food, hydration, pre-existing medical conditions, and more. It is recommended to use the knowledge from this video to become more involved with your lab results and evaluate your numbers to speak with your doctor.   Diseases of Our Time - Overview  Clinical staff conducted group  or individual video education with verbal and written material and guidebook.  Patient learns that according to the CDC, 50% to 70% of chronic diseases (such as obesity, type 2 diabetes, elevated lipids, hypertension, and heart disease) are avoidable through lifestyle improvements including healthier food choices, listening to satiety cues, and increased physical  activity.  Sleep Disorders Clinical staff conducted group or individual video education with verbal and written material and guidebook.  Patient learns how good quality and duration of sleep are important to overall health and well-being. Patient also learns about sleep disorders and how they impact health along with recommendations to address them, including discussing with a physician.  Nutrition  Dining Out - Part 2 Clinical staff conducted group or individual video education with verbal and written material and guidebook.  Patient learns how to plan ahead and communicate in order to maximize their dining experience in a healthy and nutritious manner. Included are recommended food choices based on the type of restaurant the patient is visiting.   Fueling a Banker conducted group or individual video education with verbal and written material and guidebook.  There is a strong connection between our food choices and our health. Diseases like obesity and type 2 diabetes are very prevalent and are in large-part due to lifestyle choices. The Pritikin Eating Plan provides plenty of food and hunger-curbing satisfaction. It is easy to follow, affordable, and helps reduce health risks.  Menu Workshop  Clinical staff conducted group or individual video education with verbal and written material and guidebook.  Patient learns that restaurant meals can sabotage health goals because they are often packed with calories, fat, sodium, and sugar. Recommendations include strategies to plan ahead and to communicate with the manager, chef, or server to help order a healthier meal.  Planning Your Eating Strategy  Clinical staff conducted group or individual video education with verbal and written material and guidebook.  Patient learns about the Pritikin Eating Plan and its benefit of reducing the risk of disease. The Pritikin Eating Plan does not focus on calories. Instead, it emphasizes  high-quality, nutrient-rich foods. By knowing the characteristics of the foods, we choose, we can determine their calorie density and make informed decisions.  Targeting Your Nutrition Priorities  Clinical staff conducted group or individual video education with verbal and written material and guidebook.  Patient learns that lifestyle habits have a tremendous impact on disease risk and progression. This video provides eating and physical activity recommendations based on your personal health goals, such as reducing LDL cholesterol, losing weight, preventing or controlling type 2 diabetes, and reducing high blood pressure.  Vitamins and Minerals  Clinical staff conducted group or individual video education with verbal and written material and guidebook.  Patient learns different ways to obtain key vitamins and minerals, including through a recommended healthy diet. It is important to discuss all supplements you take with your doctor.   Healthy Mind-Set    Smoking Cessation  Clinical staff conducted group or individual video education with verbal and written material and guidebook.  Patient learns that cigarette smoking and tobacco addiction pose a serious health risk which affects millions of people. Stopping smoking will significantly reduce the risk of heart disease, lung disease, and many forms of cancer. Recommended strategies for quitting are covered, including working with your doctor to develop a successful plan.  Culinary   Becoming a Set designer conducted group or individual video education with verbal and written material and guidebook.  Patient learns that cooking at home can be healthy, cost-effective, quick, and puts them in control. Keys to cooking healthy recipes will include looking at your recipe, assessing your equipment needs, planning ahead, making it simple, choosing cost-effective seasonal ingredients, and limiting the use of added fats, salts, and  sugars.  Cooking - Breakfast and Snacks  Clinical staff conducted group or individual video education with verbal and written material and guidebook.  Patient learns how important breakfast is to satiety and nutrition through the entire day. Recommendations include key foods to eat during breakfast to help stabilize blood sugar levels and to prevent overeating at meals later in the day. Planning ahead is also a key component.  Cooking - Educational psychologist conducted group or individual video education with verbal and written material and guidebook.  Patient learns eating strategies to improve overall health, including an approach to cook more at home. Recommendations include thinking of animal protein as a side on your plate rather than center stage and focusing instead on lower calorie dense options like vegetables, fruits, whole grains, and plant-based proteins, such as beans. Making sauces in large quantities to freeze for later and leaving the skin on your vegetables are also recommended to maximize your experience.  Cooking - Healthy Salads and Dressing Clinical staff conducted group or individual video education with verbal and written material and guidebook.  Patient learns that vegetables, fruits, whole grains, and legumes are the foundations of the Pritikin Eating Plan. Recommendations include how to incorporate each of these in flavorful and healthy salads, and how to create homemade salad dressings. Proper handling of ingredients is also covered. Cooking - Soups and [employer] - Soups and Desserts Clinical staff conducted group or individual video education with verbal and written material and guidebook.  Patient learns that Pritikin soups and desserts make for easy, nutritious, and delicious snacks and meal components that are low in sodium, fat, sugar, and calorie density, while high in vitamins, minerals, and filling fiber. Recommendations include simple and healthy  ideas for soups and desserts.   Overview     The Pritikin Solution Program Overview Clinical staff conducted group or individual video education with verbal and written material and guidebook.  Patient learns that the results of the Pritikin Program have been documented in more than 100 articles published in peer-reviewed journals, and the benefits include reducing risk factors for (and, in some cases, even reversing) high cholesterol, high blood pressure, type 2 diabetes, obesity, and more! An overview of the three key pillars of the Pritikin Program will be covered: eating well, doing regular exercise, and having a healthy mind-set.  WORKSHOPS  Exercise: Exercise Basics: Building Your Action Plan Clinical staff led group instruction and group discussion with PowerPoint presentation and patient guidebook. To enhance the learning environment the use of posters, models and videos may be added. At the conclusion of this workshop, patients will comprehend the difference between physical activity and exercise, as well as the benefits of incorporating both, into their routine. Patients will understand the FITT (Frequency, Intensity, Time, and Type) principle and how to use it to build an exercise action plan. In addition, safety concerns and other considerations for exercise and cardiac rehab will be addressed by the presenter. The purpose of this lesson is to promote a comprehensive and effective weekly exercise routine in order to improve patients' overall level of fitness.   Managing Heart Disease: Your Path to a Healthier Heart Clinical staff led group instruction  and group discussion with PowerPoint presentation and patient guidebook. To enhance the learning environment the use of posters, models and videos may be added.At the conclusion of this workshop, patients will understand the anatomy and physiology of the heart. Additionally, they will understand how Pritikin's three pillars impact the  risk factors, the progression, and the management of heart disease.  The purpose of this lesson is to provide a high-level overview of the heart, heart disease, and how the Pritikin lifestyle positively impacts risk factors.  Exercise Biomechanics Clinical staff led group instruction and group discussion with PowerPoint presentation and patient guidebook. To enhance the learning environment the use of posters, models and videos may be added. Patients will learn how the structural parts of their bodies function and how these functions impact their daily activities, movement, and exercise. Patients will learn how to promote a neutral spine, learn how to manage pain, and identify ways to improve their physical movement in order to promote healthy living. The purpose of this lesson is to expose patients to common physical limitations that impact physical activity. Participants will learn practical ways to adapt and manage aches and pains, and to minimize their effect on regular exercise. Patients will learn how to maintain good posture while sitting, walking, and lifting.  Balance Training and Fall Prevention  Clinical staff led group instruction and group discussion with PowerPoint presentation and patient guidebook. To enhance the learning environment the use of posters, models and videos may be added. At the conclusion of this workshop, patients will understand the importance of their sensorimotor skills (vision, proprioception, and the vestibular system) in maintaining their ability to balance as they age. Patients will apply a variety of balancing exercises that are appropriate for their current level of function. Patients will understand the common causes for poor balance, possible solutions to these problems, and ways to modify their physical environment in order to minimize their fall risk. The purpose of this lesson is to teach patients about the importance of maintaining balance as they age  and ways to minimize their risk of falling.  WORKSHOPS   Nutrition:  Fueling a Ship broker led group instruction and group discussion with PowerPoint presentation and patient guidebook. To enhance the learning environment the use of posters, models and videos may be added. Patients will review the foundational principles of the Pritikin Eating Plan and understand what constitutes a serving size in each of the food groups. Patients will also learn Pritikin-friendly foods that are better choices when away from home and review make-ahead meal and snack options. Calorie density will be reviewed and applied to three nutrition priorities: weight maintenance, weight loss, and weight gain. The purpose of this lesson is to reinforce (in a group setting) the key concepts around what patients are recommended to eat and how to apply these guidelines when away from home by planning and selecting Pritikin-friendly options. Patients will understand how calorie density may be adjusted for different weight management goals.  Mindful Eating  Clinical staff led group instruction and group discussion with PowerPoint presentation and patient guidebook. To enhance the learning environment the use of posters, models and videos may be added. Patients will briefly review the concepts of the Pritikin Eating Plan and the importance of low-calorie dense foods. The concept of mindful eating will be introduced as well as the importance of paying attention to internal hunger signals. Triggers for non-hunger eating and techniques for dealing with triggers will be explored. The purpose of this lesson is  to provide patients with the opportunity to review the basic principles of the Pritikin Eating Plan, discuss the value of eating mindfully and how to measure internal cues of hunger and fullness using the Hunger Scale. Patients will also discuss reasons for non-hunger eating and learn strategies to use for controlling  emotional eating.  Targeting Your Nutrition Priorities Clinical staff led group instruction and group discussion with PowerPoint presentation and patient guidebook. To enhance the learning environment the use of posters, models and videos may be added. Patients will learn how to determine their genetic susceptibility to disease by reviewing their family history. Patients will gain insight into the importance of diet as part of an overall healthy lifestyle in mitigating the impact of genetics and other environmental insults. The purpose of this lesson is to provide patients with the opportunity to assess their personal nutrition priorities by looking at their family history, their own health history and current risk factors. Patients will also be able to discuss ways of prioritizing and modifying the Pritikin Eating Plan for their highest risk areas  Menu  Clinical staff led group instruction and group discussion with PowerPoint presentation and patient guidebook. To enhance the learning environment the use of posters, models and videos may be added. Using menus brought in from [employer], or printed from [employer], patients will apply the Pritikin dining out guidelines that were presented in the [employer] video. Patients will also be able to practice these guidelines in a variety of provided scenarios. The purpose of this lesson is to provide patients with the opportunity to practice hands-on learning of the Pritikin Dining Out guidelines with actual menus and practice scenarios.  Label Reading Clinical staff led group instruction and group discussion with PowerPoint presentation and patient guidebook. To enhance the learning environment the use of posters, models and videos may be added. Patients will review and discuss the Pritikin label reading guidelines presented in [employer]'s Label Reading Educational series video. Using fool labels brought in from local grocery stores  and markets, patients will apply the label reading guidelines and determine if the packaged food meet the Pritikin guidelines. The purpose of this lesson is to provide patients with the opportunity to review, discuss, and practice hands-on learning of the Pritikin Label Reading guidelines with actual packaged food labels. Cooking School  Pritikin's [employer] are designed to teach patients ways to prepare quick, simple, and affordable recipes at home. The importance of nutrition's role in chronic disease risk reduction is reflected in its emphasis in the overall Pritikin program. By learning how to prepare essential core Pritikin Eating Plan recipes, patients will increase control over what they eat; be able to customize the flavor of foods without the use of added salt, sugar, or fat; and improve the quality of the food they consume. By learning a set of core recipes which are easily assembled, quickly prepared, and affordable, patients are more likely to prepare more healthy foods at home. These workshops focus on convenient breakfasts, simple entres, side dishes, and desserts which can be prepared with minimal effort and are consistent with nutrition recommendations for cardiovascular risk reduction. Cooking [employer] are taught by a Armed forces logistics/support/administrative officer (RD) who has been trained by the [employer]. The chef or RD has a clear understanding of the importance of minimizing - if not completely eliminating - added fat, sugar, and sodium in recipes. Throughout the series of Cooking School Workshop sessions, patients will learn about healthy  ingredients and efficient methods of cooking to build confidence in their capability to prepare    Cooking School weekly topics:  Adding Flavor- Sodium-Free  Fast and Healthy Breakfasts  Powerhouse Plant-Based Proteins  Satisfying Salads and Dressings  Simple Sides and Sauces  International Cuisine-Spotlight on the [employer]  Zones  Delicious Desserts  Savory Soups  [employer] - Meals in a Snap  Tasty Appetizers and Snacks  Comforting Weekend Breakfasts  One-Pot Wonders   Fast Evening Meals  Landscape architect Your Pritikin Plate  WORKSHOPS   Healthy Mindset (Psychosocial):  Focused Goals, Sustainable Changes Clinical staff led group instruction and group discussion with PowerPoint presentation and patient guidebook. To enhance the learning environment the use of posters, models and videos may be added. Patients will be able to apply effective goal setting strategies to establish at least one personal goal, and then take consistent, meaningful action toward that goal. They will learn to identify common barriers to achieving personal goals and develop strategies to overcome them. Patients will also gain an understanding of how our mind-set can impact our ability to achieve goals and the importance of cultivating a positive and growth-oriented mind-set. The purpose of this lesson is to provide patients with a deeper understanding of how to set and achieve personal goals, as well as the tools and strategies needed to overcome common obstacles which may arise along the way.  From Head to Heart: The Power of a Healthy Outlook  Clinical staff led group instruction and group discussion with PowerPoint presentation and patient guidebook. To enhance the learning environment the use of posters, models and videos may be added. Patients will be able to recognize and describe the impact of emotions and mood on physical health. They will discover the importance of self-care and explore self-care practices which may work for them. Patients will also learn how to utilize the 4 C's to cultivate a healthier outlook and better manage stress and challenges. The purpose of this lesson is to demonstrate to patients how a healthy outlook is an essential part of maintaining good health, especially as they continue their  cardiac rehab journey.  Healthy Sleep for a Healthy Heart Clinical staff led group instruction and group discussion with PowerPoint presentation and patient guidebook. To enhance the learning environment the use of posters, models and videos may be added. At the conclusion of this workshop, patients will be able to demonstrate knowledge of the importance of sleep to overall health, well-being, and quality of life. They will understand the symptoms of, and treatments for, common sleep disorders. Patients will also be able to identify daytime and nighttime behaviors which impact sleep, and they will be able to apply these tools to help manage sleep-related challenges. The purpose of this lesson is to provide patients with a general overview of sleep and outline the importance of quality sleep. Patients will learn about a few of the most common sleep disorders. Patients will also be introduced to the concept of "sleep hygiene," and discover ways to self-manage certain sleeping problems through simple daily behavior changes. Finally, the workshop will motivate patients by clarifying the links between quality sleep and their goals of heart-healthy living.   Recognizing and Reducing Stress Clinical staff led group instruction and group discussion with PowerPoint presentation and patient guidebook. To enhance the learning environment the use of posters, models and videos may be added. At the conclusion of this workshop, patients will be able to understand the types of stress reactions, differentiate between  acute and chronic stress, and recognize the impact that chronic stress has on their health. They will also be able to apply different coping mechanisms, such as reframing negative self-talk. Patients will have the opportunity to practice a variety of stress management techniques, such as deep abdominal breathing, progressive muscle relaxation, and/or guided imagery.  The purpose of this lesson is to educate  patients on the role of stress in their lives and to provide healthy techniques for coping with it.  Learning Barriers/Preferences:  Learning Barriers/Preferences - 05/04/24 1124       Learning Barriers/Preferences   Learning Barriers Sight   glasses   Learning Preferences Audio;Computer/Internet;Group Instruction;Skilled Demonstration;Verbal Instruction;Video;Written Material;Individual Instruction;Pictoral          Education Topics:  Knowledge Questionnaire Score:  Knowledge Questionnaire Score - 05/04/24 1147       Knowledge Questionnaire Score   Pre Score 23/24          Core Components/Risk Factors/Patient Goals at Admission:  Personal Goals and Risk Factors at Admission - 05/04/24 1125       Core Components/Risk Factors/Patient Goals on Admission    Weight Management Yes;Obesity    Intervention Weight Management: Develop a combined nutrition and exercise program designed to reach desired caloric intake, while maintaining appropriate intake of nutrient and fiber, sodium and fats, and appropriate energy expenditure required for the weight goal.;Weight Management: Provide education and appropriate resources to help participant work on and attain dietary goals.;Weight Management/Obesity: Establish reasonable short term and long term weight goals.;Obesity: Provide education and appropriate resources to help participant work on and attain dietary goals.    Expected Outcomes Short Term: Continue to assess and modify interventions until short term weight is achieved;Long Term: Adherence to nutrition and physical activity/exercise program aimed toward attainment of established weight goal;Understanding recommendations for meals to include 15-35% energy as protein, 25-35% energy from fat, 35-60% energy from carbohydrates, less than 200mg  of dietary cholesterol, 20-35 gm of total fiber daily;Understanding of distribution of calorie intake throughout the day with the consumption of 4-5  meals/snacks    Tobacco Cessation Yes    Number of packs per day 1-2 cigars a month    Intervention Assist the participant in steps to quit. Provide individualized education and counseling about committing to Tobacco Cessation, relapse prevention, and pharmacological support that can be provided by physician.;Education officer, environmental, assist with locating and accessing local/national Quit Smoking programs, and support quit date choice.    Expected Outcomes Short Term: Will demonstrate readiness to quit, by selecting a quit date.;Long Term: Complete abstinence from all tobacco products for at least 12 months from quit date.;Short Term: Will quit all tobacco product use, adhering to prevention of relapse plan.    Hypertension Yes    Intervention Provide education on lifestyle modifcations including regular physical activity/exercise, weight management, moderate sodium restriction and increased consumption of fresh fruit, vegetables, and low fat dairy, alcohol moderation, and smoking cessation.;Monitor prescription use compliance.    Expected Outcomes Short Term: Continued assessment and intervention until BP is < 140/41mm HG in hypertensive participants. < 130/6mm HG in hypertensive participants with diabetes, heart failure or chronic kidney disease.;Long Term: Maintenance of blood pressure at goal levels.    Lipids Yes    Intervention Provide education and support for participant on nutrition & aerobic/resistive exercise along with prescribed medications to achieve LDL 70mg , HDL >40mg .    Expected Outcomes Long Term: Cholesterol controlled with medications as prescribed, with individualized exercise RX and with personalized nutrition plan.  Value goals: LDL < 70mg , HDL > 40 mg.;Short Term: Participant states understanding of desired cholesterol values and is compliant with medications prescribed. Participant is following exercise prescription and nutrition guidelines.          Core  Components/Risk Factors/Patient Goals Review:   Goals and Risk Factor Review     Row Name 05/07/24 1714 05/18/24 0943           Core Components/Risk Factors/Patient Goals Review   Personal Goals Review Weight Management/Obesity;Hypertension;Lipids Weight Management/Obesity;Hypertension;Lipids      Review Timothy Grant started cardiac rehab on 05/07/24. Timothy Grant did well with exercise. Vital signs were stable. Timothy Grant started cardiac rehab on 05/07/24. Timothy Grant is off to a good start with exercise. Vital signs have been stable. Timothy Grant has been increasing his mets.      Expected Outcomes Timothy Grant will continue to participate in cardiac rehab for exercise, nutrition and lifestyle modifications Timothy Grant will continue to participate in cardiac rehab for exercise, nutrition and lifestyle modifications         Core Components/Risk Factors/Patient Goals at Discharge (Final Review):   Goals and Risk Factor Review - 05/18/24 0943       Core Components/Risk Factors/Patient Goals Review   Personal Goals Review Weight Management/Obesity;Hypertension;Lipids    Review Timothy Grant started cardiac rehab on 05/07/24. Timothy Grant is off to a good start with exercise. Vital signs have been stable. Timothy Grant has been increasing his mets.    Expected Outcomes Timothy Grant will continue to participate in cardiac rehab for exercise, nutrition and lifestyle modifications          ITP Comments:  ITP Comments     Row Name 05/04/24 1146 05/07/24 1712 05/18/24 0940       ITP Comments Dr. Gaylyn Keas medical director. Introduction to pritikin education/intensive cardiac rehab. Initial orientation packet reviewed with patient. 30 Day ITP Review. Timothy Grant started cardiac rehab on 05/07/24. Timothy Grant did well with exercise. 30 Day ITP Review. Timothy Grant started cardiac rehab on 05/07/24. Timothy Grant is off to a good start with exercise.        Comments: See ITP Comments

## 2024-05-23 ENCOUNTER — Encounter (HOSPITAL_COMMUNITY)
Admission: RE | Admit: 2024-05-23 | Discharge: 2024-05-23 | Disposition: A | Discharge status: Home / Self Care | Source: Ambulatory Visit | Attending: Cardiology | Admitting: Cardiology

## 2024-05-23 DIAGNOSIS — Z5189 Encounter for other specified aftercare: Secondary | ICD-10-CM | POA: Diagnosis not present

## 2024-05-23 DIAGNOSIS — I214 Non-ST elevation (NSTEMI) myocardial infarction: Secondary | ICD-10-CM

## 2024-05-23 DIAGNOSIS — Z87891 Personal history of nicotine dependence: Secondary | ICD-10-CM | POA: Diagnosis not present

## 2024-05-23 DIAGNOSIS — I252 Old myocardial infarction: Secondary | ICD-10-CM | POA: Diagnosis not present

## 2024-05-25 ENCOUNTER — Encounter (HOSPITAL_COMMUNITY)
Admission: RE | Admit: 2024-05-25 | Discharge: 2024-05-25 | Disposition: A | Discharge status: Home / Self Care | Source: Ambulatory Visit | Attending: Cardiology | Admitting: Cardiology

## 2024-05-25 DIAGNOSIS — Z5189 Encounter for other specified aftercare: Secondary | ICD-10-CM | POA: Diagnosis not present

## 2024-05-25 DIAGNOSIS — I214 Non-ST elevation (NSTEMI) myocardial infarction: Secondary | ICD-10-CM

## 2024-05-25 DIAGNOSIS — I252 Old myocardial infarction: Secondary | ICD-10-CM | POA: Diagnosis not present

## 2024-05-25 DIAGNOSIS — Z87891 Personal history of nicotine dependence: Secondary | ICD-10-CM | POA: Diagnosis not present

## 2024-05-28 ENCOUNTER — Encounter (HOSPITAL_COMMUNITY)
Admission: RE | Admit: 2024-05-28 | Discharge: 2024-05-28 | Disposition: A | Discharge status: Home / Self Care | Source: Ambulatory Visit | Attending: Cardiology | Admitting: Cardiology

## 2024-05-28 DIAGNOSIS — I214 Non-ST elevation (NSTEMI) myocardial infarction: Secondary | ICD-10-CM

## 2024-05-28 DIAGNOSIS — Z87891 Personal history of nicotine dependence: Secondary | ICD-10-CM | POA: Diagnosis not present

## 2024-05-28 DIAGNOSIS — I252 Old myocardial infarction: Secondary | ICD-10-CM | POA: Diagnosis not present

## 2024-05-28 DIAGNOSIS — Z5189 Encounter for other specified aftercare: Secondary | ICD-10-CM | POA: Diagnosis not present

## 2024-05-29 ENCOUNTER — Other Ambulatory Visit (HOSPITAL_COMMUNITY): Payer: Self-pay

## 2024-05-29 ENCOUNTER — Telehealth: Payer: Self-pay | Admitting: Pharmacist

## 2024-05-29 ENCOUNTER — Ambulatory Visit: Attending: Cardiovascular Disease | Admitting: Pharmacist

## 2024-05-29 ENCOUNTER — Telehealth: Payer: Self-pay

## 2024-05-29 ENCOUNTER — Encounter: Payer: Self-pay | Admitting: Pharmacist

## 2024-05-29 DIAGNOSIS — I214 Non-ST elevation (NSTEMI) myocardial infarction: Secondary | ICD-10-CM

## 2024-05-29 DIAGNOSIS — Z8739 Personal history of other diseases of the musculoskeletal system and connective tissue: Secondary | ICD-10-CM

## 2024-05-29 DIAGNOSIS — E7841 Elevated Lipoprotein(a): Secondary | ICD-10-CM

## 2024-05-29 DIAGNOSIS — E782 Mixed hyperlipidemia: Secondary | ICD-10-CM

## 2024-05-29 NOTE — Telephone Encounter (Signed)
 Pharmacy Patient Advocate Encounter  Received notification from [facility]  MEDICARE that Prior Authorization for REPATHA has been APPROVED from 05/29/24 to 11/28/24. Ran test claim, Copay is $302. This test claim was processed through [facility]- copay amounts may vary at other pharmacies due to pharmacy/plan contracts, or as the patient moves through the different stages of their insurance plan.  DEDUCTIBLE OF $255 TO MEET COPAY WILL BE $47 AFTER THIS FILL

## 2024-05-29 NOTE — Telephone Encounter (Signed)
 Pharmacy Patient Advocate Encounter   Received notification from Physician's Office that prior authorization for REPATHA is required/requested.   Insurance verification completed.   The patient is insured through [employer] .   Per test claim: PA required; PA submitted to above mentioned insurance via CoverMyMeds Key/confirmation #/EOC [id number] Status is pending

## 2024-05-29 NOTE — Telephone Encounter (Addendum)
 PA request has been Submitted. New Encounter has been or will be created for follow up. For additional info see Pharmacy Prior Auth telephone encounter from 05/29/24.

## 2024-05-29 NOTE — Telephone Encounter (Signed)
 Please complete Pa for Repatha/Praluent

## 2024-05-29 NOTE — Progress Notes (Signed)
 Patient ID: Timothy Grant                 DOB: 1946-07-20                    MRN: [id number]     HPI: Timothy Grant is a 78 y.o. male patient referred to lipid clinic by Artist Pouch. PMH is significant for NSTEMI, CAD, history of rhabdomyolysis, history of Hep A, and HLD.  Patient presents today to discuss lipid management. Currently managed on rosuvastatin  10mg  daily and ezetimibe  10mg  daily.  Felt chest pain after doing yard work on April 19th. Went to ER and troponin peaked at 5412. Had 50 and 90% stenosis on in D1 artery and 50% stenosis in RCA.  Has a history of Hep A treated at [facility]. While there, patient developed rhabdomyolysis. Crestor  was reduced to 10mg  and has no recurrence.   Patient reports he fells well now. Denies CP or SOB. Goes to cardiac rehab 3x weekly.  Current Medications:  Rosuvastatin  10mg  daily Ezetimibe  10mg  daily  Intolerances:  Rosuvastatin  20mg  or greater  Risk Factors:  Hx of NSTEMI CAD Elevated LPA  LDL goal: <55  Labs: TC 144, Trigs 37, HDL 52, LDL 85, LPA 1111.8 (03/25/24)  Past Medical History:  Diagnosis Date   Adenomatous polyps    Allergy    seasonal   Arthritis    Cardiomegaly    Cataract    Cirrhosis (HCC)    ED (erectile dysfunction)    Gallbladder polyp    Heart attack (HCC)    Hepatitis A    Hyperlipidemia    Hypothyroidism    Non-traumatic rhabdomyolysis 02/21/2019   Thrombocytopenia (HCC)    Varicocele 02/01/2011   Left testicle    Current Outpatient Medications on File Prior to Visit  Medication Sig Dispense Refill   acetaminophen  (TYLENOL ) 500 MG tablet Take 1,000 mg by mouth daily as needed for headache.     amLODipine  (NORVASC ) 2.5 MG tablet Take 1 tablet (2.5 mg total) by mouth daily. 30 tablet 5   aspirin  EC 81 MG tablet Take 1 tablet (81 mg total) by mouth daily. Swallow whole. 30 tablet 11   Cetirizine HCl 10 MG CAPS Take 10 mg by mouth daily.     Cholecalciferol (VITAMIN D3) 125 MCG (5000 UT) CAPS  Take 5,000 Units by mouth daily.     clopidogrel  (PLAVIX ) 75 MG tablet Take 1 tablet (75 mg total) by mouth daily. 30 tablet 11   cyanocobalamin  (VITAMIN B12) 1000 MCG tablet Take 1,000 mcg by mouth daily.     cyclobenzaprine  (FLEXERIL ) 10 MG tablet Take 1 tablet (10 mg total) by mouth 3 (three) times daily as needed for muscle spasms. (Patient taking differently: Take 10 mg by mouth at bedtime as needed for muscle spasms.) 90 tablet 3   ezetimibe  (ZETIA ) 10 MG tablet Take 1 tablet (10 mg total) by mouth daily. 30 tablet 5   folic acid  (FOLVITE ) 1 MG tablet TAKE 1 TABLET BY MOUTH DAILY 90 tablet 1   Glucosamine HCl 1000 MG TABS Take 1 tablet by mouth daily.     isosorbide  mononitrate (IMDUR ) 30 MG 24 hr tablet Take 1 tablet (30 mg total) by mouth daily. 30 tablet 5   levothyroxine  (SYNTHROID ) 137 MCG tablet Take 1 tablet by mouth daily on Monday, Wednesday, Friday. 90 tablet 3   levothyroxine  (SYNTHROID ) 150 MCG tablet TAKE 1 TABLET BY MOUTH ON TUESDAY, THURSDAY, SATURDAY,AND SUNDAY 90 tablet 3  Multiple Vitamin (MULTIVITAMIN) capsule Take 1 capsule by mouth daily. Centrum Senior     nitroGLYCERIN  (NITROSTAT ) 0.4 MG SL tablet Place 1 tablet (0.4 mg total) under the tongue every 5 (five) minutes as needed for chest pain (up to 3 doses). 25 tablet 3   Omega-3 Fatty Acids (FISH OIL) 1200 MG CAPS Take 1,200 mg by mouth daily.     rosuvastatin  (CRESTOR ) 10 MG tablet TAKE 1 TABLET BY MOUTH DAILY (Patient taking differently: Take 10 mg by mouth at bedtime.) 90 tablet 3   No current facility-administered medications on file prior to visit.    No Known Allergies  Assessment/Plan:  1. Hyperlipidemia - Patient last LDL above goal of <55. Can not tolerate further statin increases due to history of rhabdo. Due to recent NSTEMI, recommend addition of PCSK9i.  Using demo pen, educated on mechanism of action, storage, site selection, administration, and possible adverse effects. Patient able to demonstrate  in room. Will complete PA and contact patient with response.   Continue ezetimibe  10mg  daily Continue rosuvastatin  10mg  daily Start Repatha/Praluent q 2 weeks Recheck fasting lipid panel in 2-3 months  Chris Less Woolsey, PharmD, [place], CDCES, CPP [facility] [address], [place], [place] [address] Phone: [phone]; Fax: [phone] 05/29/2024 9:04 AM

## 2024-05-29 NOTE — Patient Instructions (Addendum)
 It was nice meeting you today  We would like your LDL (bad cholesterol) to be less than 55  Please continue your ezetimibe  10mg  and Crestor  10mg  daily  The medication we discussed today is called Repatha, which is medication you would administer once every 2 weeks  I will complete the prior authorization and let you know the response  Once you start the medication we will recheck your fasting lipid panel in 2-3 months  Please call or message with any questions  Medford Bolk, PharmD, BCACP, CDCES, CPP [facility] [address] [place], [place] [address] Phone: [phone]; Fax: [phone] 05/29/2024 8:51 AM

## 2024-05-30 ENCOUNTER — Encounter (HOSPITAL_COMMUNITY)
Admission: RE | Admit: 2024-05-30 | Discharge: 2024-05-30 | Disposition: A | Discharge status: Home / Self Care | Source: Ambulatory Visit | Attending: Cardiology | Admitting: Cardiology

## 2024-05-30 DIAGNOSIS — I252 Old myocardial infarction: Secondary | ICD-10-CM | POA: Diagnosis not present

## 2024-05-30 DIAGNOSIS — Z5189 Encounter for other specified aftercare: Secondary | ICD-10-CM | POA: Diagnosis not present

## 2024-05-30 DIAGNOSIS — I214 Non-ST elevation (NSTEMI) myocardial infarction: Secondary | ICD-10-CM

## 2024-05-30 DIAGNOSIS — Z87891 Personal history of nicotine dependence: Secondary | ICD-10-CM | POA: Diagnosis not present

## 2024-05-30 MED ORDER — REPATHA SURECLICK 140 MG/ML ~~LOC~~ SOAJ: 1.0000 mL | SUBCUTANEOUS | 11 refills | Status: AC

## 2024-05-30 NOTE — Telephone Encounter (Signed)
 Contacted patient to let him know of Repatha approval and cost with deductible. Patient requests 1 month at a time. Rx sent and lipid panel ordered

## 2024-05-30 NOTE — Addendum Note (Signed)
 Addended by: DARRELL BRUCKNER on: 05/30/2024 03:36 PM   Modules accepted: Orders

## 2024-06-01 ENCOUNTER — Encounter (HOSPITAL_COMMUNITY)
Admission: RE | Admit: 2024-06-01 | Discharge: 2024-06-01 | Disposition: A | Discharge status: Home / Self Care | Source: Ambulatory Visit | Attending: Cardiology

## 2024-06-01 ENCOUNTER — Other Ambulatory Visit: Payer: Self-pay | Admitting: Family Medicine

## 2024-06-01 DIAGNOSIS — Z87891 Personal history of nicotine dependence: Secondary | ICD-10-CM | POA: Diagnosis not present

## 2024-06-01 DIAGNOSIS — I214 Non-ST elevation (NSTEMI) myocardial infarction: Secondary | ICD-10-CM

## 2024-06-01 DIAGNOSIS — Z5189 Encounter for other specified aftercare: Secondary | ICD-10-CM | POA: Diagnosis not present

## 2024-06-01 DIAGNOSIS — I252 Old myocardial infarction: Secondary | ICD-10-CM | POA: Diagnosis not present

## 2024-06-04 ENCOUNTER — Encounter (HOSPITAL_COMMUNITY)
Admission: RE | Admit: 2024-06-04 | Discharge: 2024-06-04 | Disposition: A | Discharge status: Home / Self Care | Source: Ambulatory Visit | Attending: Cardiology

## 2024-06-04 DIAGNOSIS — Z5189 Encounter for other specified aftercare: Secondary | ICD-10-CM | POA: Diagnosis not present

## 2024-06-04 DIAGNOSIS — I252 Old myocardial infarction: Secondary | ICD-10-CM | POA: Diagnosis not present

## 2024-06-04 DIAGNOSIS — Z87891 Personal history of nicotine dependence: Secondary | ICD-10-CM | POA: Diagnosis not present

## 2024-06-04 DIAGNOSIS — I214 Non-ST elevation (NSTEMI) myocardial infarction: Secondary | ICD-10-CM

## 2024-06-04 NOTE — Progress Notes (Signed)
 Reviewed home exercise guidelines with Chyrl including endpoints, temperature precautions, target heart rate and rate of perceived exertion. He is currently walking 20 minutes twice daily as his mode of home exercise. Chyrl voices understanding of instructions given.  Arnoldo CHRISTELLA Gal, MS, ACSM CEP

## 2024-06-06 ENCOUNTER — Encounter (HOSPITAL_COMMUNITY)
Admission: RE | Admit: 2024-06-06 | Discharge: 2024-06-06 | Disposition: A | Discharge status: Home / Self Care | Source: Ambulatory Visit | Attending: Cardiology | Admitting: Cardiology

## 2024-06-06 DIAGNOSIS — I214 Non-ST elevation (NSTEMI) myocardial infarction: Secondary | ICD-10-CM | POA: Insufficient documentation

## 2024-06-18 ENCOUNTER — Encounter (HOSPITAL_COMMUNITY)
Admission: RE | Admit: 2024-06-18 | Discharge: 2024-06-18 | Disposition: A | Discharge status: Home / Self Care | Source: Ambulatory Visit | Attending: Cardiology | Admitting: Cardiology

## 2024-06-18 DIAGNOSIS — I214 Non-ST elevation (NSTEMI) myocardial infarction: Secondary | ICD-10-CM

## 2024-06-18 NOTE — Progress Notes (Signed)
 Cardiac Individual Treatment Plan  Patient Details  Name: Timothy Grant MRN: [id number] Date of Birth: 01-15-1946 Referring Provider:   Flowsheet Row INTENSIVE CARDIAC REHAB ORIENT from 05/04/2024 in [facility] for Heart, Vascular, & Lung Health  Referring Provider Peter Swazil[place], MD    Initial Encounter Date:  Flowsheet Row INTENSIVE CARDIAC REHAB ORIENT from 05/04/2024 in [facility] for Heart, Vascular, & Lung Health  Date 05/04/24    Visit Diagnosis: 03/09/24 NSTEMI (non-ST elevated myocardial infarction) [facility])  Patient's Home Medications on Admission:  Current Outpatient Medications:    acetaminophen  (TYLENOL ) 500 MG tablet, Take 1,000 mg by mouth daily as needed for headache., Disp: , Rfl:    amLODipine  (NORVASC ) 2.5 MG tablet, Take 1 tablet (2.5 mg total) by mouth daily., Disp: 30 tablet, Rfl: 5   aspirin  EC 81 MG tablet, Take 1 tablet (81 mg total) by mouth daily. Swallow whole., Disp: 30 tablet, Rfl: 11   Cetirizine HCl 10 MG CAPS, Take 10 mg by mouth daily., Disp: , Rfl:    Cholecalciferol (VITAMIN D3) 125 MCG (5000 UT) CAPS, Take 5,000 Units by mouth daily., Disp: , Rfl:    clopidogrel  (PLAVIX ) 75 MG tablet, Take 1 tablet (75 mg total) by mouth daily., Disp: 30 tablet, Rfl: 11   cyanocobalamin  (VITAMIN B12) 1000 MCG tablet, Take 1,000 mcg by mouth daily., Disp: , Rfl:    cyclobenzaprine  (FLEXERIL ) 10 MG tablet, Take 1 tablet (10 mg total) by mouth 3 (three) times daily as needed for muscle spasms. (Patient taking differently: Take 10 mg by mouth at bedtime as needed for muscle spasms.), Disp: 90 tablet, Rfl: 3   Evolocumab  (REPATHA  SURECLICK) 140 MG/ML SOAJ, Inject 140 mg into the skin every 14 (fourteen) days., Disp: 2 mL, Rfl: 11   ezetimibe  (ZETIA ) 10 MG tablet, Take 1 tablet (10 mg total) by mouth daily., Disp: 30 tablet, Rfl: 5   folic acid  (FOLVITE ) 1 MG tablet, TAKE 1 TABLET BY MOUTH DAILY, Disp: 90 tablet, Rfl: 1    Glucosamine HCl 1000 MG TABS, Take 1 tablet by mouth daily., Disp: , Rfl:    isosorbide  mononitrate (IMDUR ) 30 MG 24 hr tablet, Take 1 tablet (30 mg total) by mouth daily., Disp: 30 tablet, Rfl: 5   levothyroxine  (SYNTHROID ) 137 MCG tablet, Take 1 tablet by mouth daily on Monday, Wednesday, Friday., Disp: 90 tablet, Rfl: 3   levothyroxine  (SYNTHROID ) 150 MCG tablet, TAKE 1 TABLET BY MOUTH ON TUESDAY, THURSDAY, SATURDAY,AND SUNDAY, Disp: 90 tablet, Rfl: 3   Multiple Vitamin (MULTIVITAMIN) capsule, Take 1 capsule by mouth daily. Centrum Senior, Disp: , Rfl:    nitroGLYCERIN  (NITROSTAT ) 0.4 MG SL tablet, Place 1 tablet (0.4 mg total) under the tongue every 5 (five) minutes as needed for chest pain (up to 3 doses)., Disp: 25 tablet, Rfl: 3   Omega-3 Fatty Acids (FISH OIL) 1200 MG CAPS, Take 1,200 mg by mouth daily., Disp: , Rfl:    rosuvastatin  (CRESTOR ) 10 MG tablet, TAKE 1 TABLET BY MOUTH DAILY (Patient taking differently: Take 10 mg by mouth at bedtime.), Disp: 90 tablet, Rfl: 3  Past Medical History: Past Medical History:  Diagnosis Date   Adenomatous polyps    Allergy    seasonal   Arthritis    Cardiomegaly    Cataract    Cirrhosis (HCC)    ED (erectile dysfunction)    Gallbladder polyp    Heart attack (HCC)    Hepatitis A    Hyperlipidemia  Hypothyroidism    Non-traumatic rhabdomyolysis 02/21/2019   Thrombocytopenia (HCC)    Varicocele 02/01/2011   Left testicle    Tobacco Use: Social History   Tobacco Use  Smoking Status Former   Types: Cigarettes, Cigars  Smokeless Tobacco Never  Tobacco Comments   quit 1975    Labs: Review Flowsheet  More data exists      Latest Ref Rng & Units 08/25/2020 05/18/2021 02/02/2024 03/25/2024 05/09/2024  Labs for ITP Cardiac and Pulmonary Rehab  Cholestrol 100 - 199 mg/dL 841  838  855  855  875   LDL (calc) 0 - 99 mg/dL 82  83  82  85  63   HDL-C >39 mg/dL 59  38.49  52.99  52  48   Trlycerides 0 - 149 mg/dL 88  16.9  22.9  37  60      Capillary Blood Glucose: No results found for: GLUCAP   Exercise Target Goals: Exercise Program Goal: Individual exercise prescription set using results from initial 6 min walk test and THRR while considering  patient's activity barriers and safety.   Exercise Prescription Goal: Initial exercise prescription builds to 30-45 minutes a day of aerobic activity, 2-3 days per week.  Home exercise guidelines will be given to patient during program as part of exercise prescription that the participant will acknowledge.  Activity Barriers & Risk Stratification:  Activity Barriers & Cardiac Risk Stratification - 05/04/24 1123       Activity Barriers & Cardiac Risk Stratification   Activity Barriers Arthritis;History of Falls;Joint Problems    Cardiac Risk Stratification High   <5 METs on         6 Minute Walk:  6 Minute Walk     Row Name 05/04/24 1210         6 Minute Walk   Phase Initial     Distance 1500 feet     Walk Time 6 minutes     # of Rest Breaks 0     MPH 2.84     METS 2.59     RPE 9     Perceived Dyspnea  0     VO2 Peak 9.05     Symptoms Yes (comment)     Comments pt stated he felt swimmy when turning to sit down at completion of , BP 122/62. Resolved immediately with sitting. After sitting for a few minutes asked pt to stand, lightheaded/swimmy did not return. no other s/sx.     Resting HR 73 bpm     Resting BP 102/60     Resting Oxygen Saturation  97 %     Exercise Oxygen Saturation  during 6 min walk 97 %     Max Ex. HR 101 bpm     Max Ex. BP 122/62     2 Minute Post BP 114/60        Oxygen Initial Assessment:   Oxygen Re-Evaluation:   Oxygen Discharge (Final Oxygen Re-Evaluation):   Initial Exercise Prescription:  Initial Exercise Prescription - 05/04/24 1100       Date of Initial Exercise RX and Referring Provider   Date 05/04/24    Referring Provider Peter Swazil[place], MD    Expected Discharge Date 07/25/24      Bike   Level  1    Watts 40    Minutes 15    METs 2      NuStep   Level 2    SPM 70    Minutes  15    METs 2.2      Prescription Details   Frequency (times per week) 3    Duration Progress to 30 minutes of continuous aerobic without signs/symptoms of physical distress      Intensity   THRR 40-80% of Max Heartrate 57-114    Ratings of Perceived Exertion 11-13    Perceived Dyspnea 0-4      Progression   Progression Continue progressive overload as per policy without signs/symptoms or physical distress.      Resistance Training   Training Prescription Yes    Weight 3    Reps 10-15          Perform Capillary Blood Glucose checks as needed.  Exercise Prescription Changes:   Exercise Prescription Changes     Row Name 05/07/24 1039 05/14/24 1038 06/01/24 1035 06/18/24 1034       Response to Exercise   Blood Pressure (Admit) 102/60 110/62 98/60 100/60    Blood Pressure (Exercise) 152/76 158/78 144/80 150/62    Blood Pressure (Exit) 100/62 104/72 92/64 110/70    Heart Rate (Admit) 78 bpm 69 bpm 63 bpm 67 bpm    Heart Rate (Exercise) 120 bpm 120 bpm 122 bpm 121 bpm    Heart Rate (Exit) 86 bpm 78 bpm 72 bpm 79 bpm    Symptoms None None None None    Comments Off to a good start with exercise. -- Reviewd exercise prescription and goals with Timothy Grant. Increased workload on bike. --    Duration Continue with 30 min of aerobic exercise without signs/symptoms of physical distress. Continue with 30 min of aerobic exercise without signs/symptoms of physical distress. Continue with 30 min of aerobic exercise without signs/symptoms of physical distress. Continue with 30 min of aerobic exercise without signs/symptoms of physical distress.    Intensity THRR unchanged THRR unchanged THRR unchanged THRR unchanged      Progression   Progression Continue to progress workloads to maintain intensity without signs/symptoms of physical distress. Continue to progress workloads to maintain intensity without  signs/symptoms of physical distress. Continue to progress workloads to maintain intensity without signs/symptoms of physical distress. Continue to progress workloads to maintain intensity without signs/symptoms of physical distress.    Average METs 2.7 2.9 3.8 3.8      Resistance Training   Training Prescription Yes Yes Yes Yes    Weight 3 lbs 3 lbs 5 lbs 5 lbs    Reps 10-15 10-15 10-15 10-15    Time 5 Minutes 5 Minutes 5 Minutes 5 Minutes      Interval Training   Interval Training No No No No      Bike   Level 1 1 2 2     Watts 21 22 48 41    Minutes 15 15 15 15     METs 2.6 2.6 3.5 3.3      NuStep   Level 1 1 2 2     SPM 100 118 119 135    Minutes 15 15 15 15     METs 2.8 3.3 4.2 4.2      Home Exercise Plan   Plans to continue exercise at -- -- Home (comment) Home (comment)    Frequency -- -- Add 4 additional days to program exercise sessions. Add 4 additional days to program exercise sessions.    Initial Home Exercises Provided -- -- 06/01/24 06/01/24       Exercise Comments:   Exercise Comments     Row Name 05/07/24 1139 05/18/24 1101  06/01/24 1104       Exercise Comments Jeff tolerated low intensity exercise well without symptoms. Oriented him to the stretching and exercise routine. Stretching handout given. Reviewed METs and goals with Timothy Grant. Reviewed home exercise guidelines and goals with Timothy Grant.        Exercise Goals and Review:   Exercise Goals     Row Name 05/04/24 1124             Exercise Goals   Increase Physical Activity Yes       Intervention Provide advice, education, support and counseling about physical activity/exercise needs.;Develop an individualized exercise prescription for aerobic and resistive training based on initial evaluation findings, risk stratification, comorbidities and participant's personal goals.       Expected Outcomes Short Term: Attend rehab on a regular basis to increase amount of physical activity.;Long Term: Exercising  regularly at least 3-5 days a week.;Long Term: Add in home exercise to make exercise part of routine and to increase amount of physical activity.       Increase Strength and Stamina Yes       Intervention Provide advice, education, support and counseling about physical activity/exercise needs.;Develop an individualized exercise prescription for aerobic and resistive training based on initial evaluation findings, risk stratification, comorbidities and participant's personal goals.       Expected Outcomes Short Term: Increase workloads from initial exercise prescription for resistance, speed, and METs.;Short Term: Perform resistance training exercises routinely during rehab and add in resistance training at home;Long Term: Improve cardiorespiratory fitness, muscular endurance and strength as measured by increased METs and functional capacity ( )       Able to understand and use rate of perceived exertion (RPE) scale Yes       Intervention Provide education and explanation on how to use RPE scale       Expected Outcomes Short Term: Able to use RPE daily in rehab to express subjective intensity level;Long Term:  Able to use RPE to guide intensity level when exercising independently       Knowledge and understanding of Target Heart Rate Range (THRR) Yes       Intervention Provide education and explanation of THRR including how the numbers were predicted and where they are located for reference       Expected Outcomes Short Term: Able to state/look up THRR;Short Term: Able to use daily as guideline for intensity in rehab;Long Term: Able to use THRR to govern intensity when exercising independently       Understanding of Exercise Prescription Yes       Intervention Provide education, explanation, and written materials on patient's individual exercise prescription       Expected Outcomes Short Term: Able to explain program exercise prescription;Long Term: Able to explain home exercise prescription to exercise  independently          Exercise Goals Re-Evaluation :  Exercise Goals Re-Evaluation     Row Name 05/07/24 1139 05/18/24 1101 06/01/24 1104         Exercise Goal Re-Evaluation   Exercise Goals Review Increase Physical Activity;Increase Strength and Stamina;Able to understand and use rate of perceived exertion (RPE) scale Increase Physical Activity;Increase Strength and Stamina;Able to understand and use rate of perceived exertion (RPE) scale;Knowledge and understanding of Target Heart Rate Range (THRR) Increase Physical Activity;Increase Strength and Stamina;Able to understand and use rate of perceived exertion (RPE) scale;Knowledge and understanding of Target Heart Rate Range (THRR);Understanding of Exercise Prescription     Comments Timothy Grant was  able to understand and use RPE scale appropriately. Timothy Grant is walking 25 minutes twice/ day as his mode of home exercise. He is golfing and gardening without issue. Reviewed THRR with him. Reviewed exercise prescription with Timothy Grant. He's walking 20 minutes twice daily as his mode of home exercise.     Expected Outcomes Progress workloads as tolerated to help increase cardiorespiratory fitness. Continue daily walking in addition to exercise at cardiac rehab. Continue daily walking to help achieve personal health and fitness goals.        Discharge Exercise Prescription (Final Exercise Prescription Changes):  Exercise Prescription Changes - 06/18/24 1034       Response to Exercise   Blood Pressure (Admit) 100/60    Blood Pressure (Exercise) 150/62    Blood Pressure (Exit) 110/70    Heart Rate (Admit) 67 bpm    Heart Rate (Exercise) 121 bpm    Heart Rate (Exit) 79 bpm    Symptoms None    Duration Continue with 30 min of aerobic exercise without signs/symptoms of physical distress.    Intensity THRR unchanged      Progression   Progression Continue to progress workloads to maintain intensity without signs/symptoms of physical distress.    Average METs  3.8      Resistance Training   Training Prescription Yes    Weight 5 lbs    Reps 10-15    Time 5 Minutes      Interval Training   Interval Training No      Bike   Level 2    Watts 41    Minutes 15    METs 3.3      NuStep   Level 2    SPM 135    Minutes 15    METs 4.2      Home Exercise Plan   Plans to continue exercise at Home (comment)    Frequency Add 4 additional days to program exercise sessions.    Initial Home Exercises Provided 06/01/24          Nutrition:  Target Goals: Understanding of nutrition guidelines, daily intake of sodium 1500mg , cholesterol 200mg , calories 30% from fat and 7% or less from saturated fats, daily to have 5 or more servings of fruits and vegetables.  Biometrics:  Pre Biometrics - 05/04/24 1121       Pre Biometrics   Waist Circumference 43.5 inches    Hip Circumference 43 inches    Waist to Hip Ratio 1.01 %    Triceps Skinfold 26 mm    % Body Fat 32.7 %    Grip Strength 34 kg    Flexibility 13.5 in    Single Leg Stand 2 seconds           Nutrition Therapy Plan and Nutrition Goals:  Nutrition Therapy & Goals - 06/11/24 1022       Nutrition Therapy   Diet heart healthy diet    Drug/Food Interactions Statins/Certain Fruits      Personal Nutrition Goals   Nutrition Goal Patient to identify strategies for reducing cardiovascular risk by attending the [employer] education and nutrition series weekly.   goal in action.   Personal Goal #2 Patient to improve diet quality by using the plate method as a guide for meal planning to include lean protein/plant protein, fruits, vegetables, whole grains, nonfat dairy as part of a well-balanced diet.   goal in action.   Comments Goals in action. Timothy Grant has medical history of coronary artery disease, NSTEMI, hyperlipidemia,  pancytopenia, thoracic aortic aneurysm, compensated cirrhosis secondary to prior acute hepatitis A.He continues to attend the [employer] education and nutrition series  regularly. LDL is not at goal; per cardiology documentation at 04/11/24 follow-up, Rhabdomyolysis on higher dose Crestor . Currently on Crestor  10 mg and Zetia  10 mg. Goal to reduce LDL to 55 or less due to multivessel coronary artery disease. Repatha /PCSK9 inhibitors started on 05/30/24. He continues regular follow-up with GI related to history of hepatitis A and cirrhosis. He is down 8.8# since starting with our program. Patient will benefit from participation in intensive cardiac rehab for nutrition education, exercise, and lifestyle modification.      Intervention Plan   Intervention Prescribe, educate and counsel regarding individualized specific dietary modifications aiming towards targeted core components such as weight, hypertension, lipid management, diabetes, heart failure and other comorbidities.;Nutrition handout(s) given to patient.    Expected Outcomes Short Term Goal: Understand basic principles of dietary content, such as calories, fat, sodium, cholesterol and nutrients.;Long Term Goal: Adherence to prescribed nutrition plan.          Nutrition Assessments:  Nutrition Assessments - 05/08/24 1424       Rate Your Plate Scores   Pre Score 56         MEDIFICTS Score Key: >=70 Need to make dietary changes  40-70 Heart Healthy Diet <= 40 Therapeutic Level Cholesterol Diet   Flowsheet Row INTENSIVE CARDIAC REHAB from 05/07/2024 in [facility] for Heart, Vascular, & Lung Health  Picture Your Plate Total Score on Admission 56   Picture Your Plate Scores: <59 Unhealthy dietary pattern with much room for improvement. 41-50 Dietary pattern unlikely to meet recommendations for good health and room for improvement. 51-60 More healthful dietary pattern, with some room for improvement.  >60 Healthy dietary pattern, although there may be some specific behaviors that could be improved.    Nutrition Goals Re-Evaluation:  Nutrition Goals Re-Evaluation     Row Name  05/07/24 1442 06/11/24 1022           Goals   Current Weight 228 lb 13.4 oz (103.8 kg) 221 lb 5.5 oz (100.4 kg)      Comment Lpa 111.8, LDL 85 (goal <55), HDL 52 LDL 65 (improved but not at goal of <55),  Lpa 111.8      Expected Outcome Timothy Grant has medical history of coronary artery disease, NSTEMI, hyperlipidemia, pancytopenia, thoracic aortic aneurysm, compensated cirrhosis secondary to prior acute hepatitis A.LDL is not at goal; per cardiology documentation at 04/11/24 follow-up, Rhabdomyolysis on higher dose Crestor . Currently on Crestor  10 mg and Zetia  10 mg. Goal to reduce LDL to 55 or less due to multivessel coronary artery disease. Discussed potential use of PCSK9 inhibitors if cholesterol goals are not met with current regimen. He continues regular follow-up with GI related to history of hepatitis A and cirrhosis. Patient will benefit from participation in intensive cardiac rehab for nutrition education, exercise, and lifestyle modification. Goals in action. 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 LDL is not at goal; per cardiology documentation at 04/11/24 follow-up, Rhabdomyolysis on higher dose Crestor . Currently on Crestor  10 mg and Zetia  10 mg. Goal to reduce LDL to 55 or less due to multivessel coronary artery disease. Repatha /PCSK9 inhibitors started on 05/30/24. He continues regular follow-up with GI related to history of hepatitis A and cirrhosis. He  is down 8.8# since starting with our program. Patient will benefit from participation in intensive cardiac rehab for nutrition education, exercise, and lifestyle modification.         Nutrition Goals Re-Evaluation:  Nutrition Goals Re-Evaluation     Row Name 05/07/24 1442 06/11/24 1022           Goals   Current Weight 228 lb  13.4 oz (103.8 kg) 221 lb 5.5 oz (100.4 kg)      Comment Lpa 111.8, LDL 85 (goal <55), HDL 52 LDL 65 (improved but not at goal of <55),  Lpa 111.8      Expected Outcome Timothy Grant has medical history of coronary artery disease, NSTEMI, hyperlipidemia, pancytopenia, thoracic aortic aneurysm, compensated cirrhosis secondary to prior acute hepatitis A.LDL is not at goal; per cardiology documentation at 04/11/24 follow-up, Rhabdomyolysis on higher dose Crestor . Currently on Crestor  10 mg and Zetia  10 mg. Goal to reduce LDL to 55 or less due to multivessel coronary artery disease. Discussed potential use of PCSK9 inhibitors if cholesterol goals are not met with current regimen. He continues regular follow-up with GI related to history of hepatitis A and cirrhosis. Patient will benefit from participation in intensive cardiac rehab for nutrition education, exercise, and lifestyle modification. Goals in action. 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 LDL is not at goal; per cardiology documentation at 04/11/24 follow-up, Rhabdomyolysis on higher dose Crestor . Currently on Crestor  10 mg and Zetia  10 mg. Goal to reduce LDL to 55 or less due to multivessel coronary artery disease. Repatha /PCSK9 inhibitors started on 05/30/24. He continues regular follow-up with GI related to history of hepatitis A and cirrhosis. He is down 8.8# since starting with our program. Patient will benefit from participation in intensive cardiac rehab for nutrition education, exercise, and lifestyle modification.         Nutrition Goals Discharge (Final Nutrition Goals Re-Evaluation):  Nutrition Goals Re-Evaluation - 06/11/24 1022       Goals   Current Weight 221 lb 5.5 oz (100.4 kg)    Comment LDL 65 (improved but not at goal of <55),   Lpa 111.8    Expected Outcome Goals in action. 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 LDL is not at goal; per cardiology documentation at 04/11/24 follow-up, Rhabdomyolysis on higher dose Crestor . Currently on Crestor  10 mg and Zetia  10 mg. Goal to reduce LDL to 55 or less due to multivessel coronary artery disease. Repatha /PCSK9 inhibitors started on 05/30/24. He continues regular follow-up with GI related to history of hepatitis A and cirrhosis. He is down 8.8# since starting with our program. Patient will benefit from participation in intensive cardiac rehab for nutrition education, exercise, and lifestyle modification.          Psychosocial: Target Goals: Acknowledge presence or absence of significant depression and/or stress, maximize coping skills, provide positive support system. Participant is able to verbalize types and ability to use techniques and skills needed for reducing stress and depression.  Initial Review & Psychosocial Screening:  Initial Psych Review & Screening - 05/04/24 1124       Initial Review   Current issues with None Identified      Family Dynamics   Good Support  System? Yes   wife for support     Barriers   Psychosocial barriers to participate in program There are no identifiable barriers or psychosocial needs.      Screening Interventions   Interventions Encouraged to exercise;Provide feedback about the scores to participant    Expected Outcomes Short Term goal: Identification and review with participant of any Quality of Life or Depression concerns found by scoring the questionnaire.;Long Term goal: The participant improves quality of Life and PHQ9 Scores as seen by post scores and/or verbalization of changes          Quality of Life Scores:   Quality of Life - 05/04/24 1210       Quality of Life   Select Quality of Life      Quality of Life Scores   Health/Function Pre 25.2 %    Socioeconomic Pre 25.43 %    Psych/Spiritual Pre 22.93 %    Family Pre 24 %    GLOBAL Pre 24.6 %         Scores of 19 and below usually indicate a poorer quality of life in these areas.  A difference of  2-3 points is a clinically meaningful difference.  A difference of 2-3 points in the total score of the Quality of Life Index has been associated with significant improvement in overall quality of life, self-image, physical symptoms, and general health in studies assessing change in quality of life.  PHQ-9: Review Flowsheet  More data exists      05/21/2024 05/04/2024 02/09/2024 05/16/2023 02/07/2023  Depression screen PHQ 2/9  Decreased Interest 0 0 0 0 0  Down, Depressed, Hopeless 0 0 0 0 0  PHQ - 2 Score 0 0 0 0 0  Altered sleeping 0 0 - - -  Tired, decreased energy 0 0 - - -  Change in appetite 0 1 - - -  Feeling bad or failure about yourself  0 1 - - -  Trouble concentrating 0 0 - - -  Moving slowly or fidgety/restless 0 0 - - -  Suicidal thoughts 0 0 - - -  PHQ-9 Score 0 2 - - -   Interpretation of Total Score  Total Score Depression Severity:  1-4 = Minimal depression, 5-9 = Mild depression, 10-14 = Moderate depression, 15-19 = Moderately severe depression, 20-27 = Severe depression   Psychosocial Evaluation and Intervention:   Psychosocial Re-Evaluation:  Psychosocial Re-Evaluation     Row Name 05/07/24 1714 05/18/24 0940 06/12/24 1709         Psychosocial Re-Evaluation   Current issues with None Identified None Identified None Identified     Comments -- Reviewed PHQ9. Timothy Grant said that he had been overeating. Timothy Grant says he is now making better choices. Timothy Grant says he is feeling better since starting cardiac rehab. --     Interventions Encouraged to attend Cardiac Rehabilitation for the exercise Encouraged to attend Cardiac  Rehabilitation for the exercise Encouraged to attend Cardiac Rehabilitation for the exercise     Continue Psychosocial Services  No Follow up required No Follow up required No Follow up required        Psychosocial Discharge (Final Psychosocial Re-Evaluation):  Psychosocial Re-Evaluation - 06/12/24 1709       Psychosocial Re-Evaluation   Current issues with None Identified    Interventions Encouraged to attend Cardiac Rehabilitation for the exercise    Continue Psychosocial Services  No Follow up required          Vocational  Rehabilitation: Provide vocational rehab assistance to qualifying candidates.   Vocational Rehab Evaluation & Intervention:  Vocational Rehab - 05/04/24 1125       Initial Vocational Rehab Evaluation & Intervention   Assessment shows need for Vocational Rehabilitation No   retired         Education: Education Goals: Education classes will be provided on a weekly basis, covering required topics. Participant will state understanding/return demonstration of topics presented.    Education     Row Name 05/07/24 1300     Education   Cardiac Education Topics Pritikin   Geographical information systems officer Exercise   Exercise Workshop Exercise Basics: Building Your Action Plan   Instruction Review Code 1- Verbalizes Understanding   Class Start Time 1157   Class Stop Time 1243   Class Time Calculation (min) 46 min    Row Name 05/09/24 1500     Education   Cardiac Education Topics Pritikin   Customer service manager   Weekly Topic Efficiency Cooking - Meals in a Snap   Instruction Review Code 1- Verbalizes Understanding   Class Start Time 1145   Class Stop Time 1222   Class Time Calculation (min) 37 min    Row Name 05/11/24 0900     Education   Cardiac Education Topics Pritikin   Psychologist, forensic  Exercise Education   Exercise Education Move It!   Instruction Review Code 1- Verbalizes Understanding   Class Start Time [id number]   Class Stop Time 0845   Class Time Calculation (min) 35 min    Row Name 05/14/24 1300     Education   Cardiac Education Topics Pritikin   Glass blower/designer Nutrition   Nutrition Workshop Targeting Your Nutrition Priorities   Instruction Review Code 1- Verbalizes Understanding   Class Start Time 1150   Class Stop Time 1230   Class Time Calculation (min) 40 min    Row Name 05/16/24 1300     Education   Cardiac Education Topics Pritikin   Customer service manager   Weekly Topic One-Pot Wonders   Instruction Review Code 1- Verbalizes Understanding   Class Start Time 1145   Class Stop Time 1220   Class Time Calculation (min) 35 min    Row Name 05/18/24 1200     Education   Cardiac Education Topics Pritikin   Psychologist, forensic General Education   General Education Hypertension and Heart Disease   Instruction Review Code 1- Verbalizes Understanding   Class Start Time 1155   Class Stop Time 1232   Class Time Calculation (min) 37 min    Row Name 05/21/24 1400     Education   Cardiac Education Topics Pritikin   [employer] Workshops     Workshops   Educator Exercise Physiologist   [employer] Psychosocial   Psychosocial Workshop Focused Goals, Sustainable Changes   Instruction Review Code 1- Verbalizes Understanding   Class Start Time 1155   Class Stop Time 1230   Class Time Calculation (min) 35 min    Row Name 05/23/24 1300     Education  Cardiac Education Topics Pritikin   Customer service manager   Weekly Topic Comforting Weekend Breakfasts   Instruction Review Code 1- Verbalizes Understanding   Class Start Time 1145   Class Stop Time 1220   Class Time  Calculation (min) 35 min    Row Name 05/25/24 1100     Education   Cardiac Education Topics Pritikin   Select Core Videos     Core Videos   Educator Exercise Physiologist   Select Nutrition   Nutrition Dining Out - Part 1   Instruction Review Code 1- Verbalizes Understanding   Class Start Time 1147   Class Stop Time 1226   Class Time Calculation (min) 39 min    Row Name 05/28/24 1100     Education   Cardiac Education Topics Pritikin   Psychologist, forensic Exercise Education   Exercise Education Biomechanial Limitations   Instruction Review Code 1- Verbalizes Understanding   Class Start Time 1155   Class Stop Time 1235   Class Time Calculation (min) 40 min    Row Name 05/30/24 1300     Education   Cardiac Education Topics Pritikin   Customer service manager   Weekly Topic Fast Evening Meals   Instruction Review Code 1- Verbalizes Understanding   Class Start Time 1145   Class Stop Time 1225   Class Time Calculation (min) 40 min    Row Name 06/01/24 1100     Education   Cardiac Education Topics Pritikin   Licensed conveyancer Nutrition   Nutrition Vitamins and Minerals   Instruction Review Code 1- Verbalizes Understanding   Class Start Time 1145   Class Stop Time 1225   Class Time Calculation (min) 40 min    Row Name 06/04/24 1600     Education   Cardiac Education Topics Pritikin   Glass blower/designer Nutrition   Nutrition Workshop Fueling a Forensic psychologist   Instruction Review Code 1- Tax inspector   Class Start Time 1145   Class Stop Time 1230   Class Time Calculation (min) 45 min    Row Name 06/06/24 1500     Education   Cardiac Education Topics Pritikin   Customer service manager   Weekly Topic International  Cuisine- Spotlight on the [employer] Zones   Instruction Review Code 1- Verbalizes Understanding   Class Start Time 1145   Class Stop Time 1225   Class Time Calculation (min) 40 min    Row Name 06/18/24 1200     Education   Cardiac Education Topics Pritikin   Hospital doctor Education   General Education Hypertension and Heart Disease   Instruction Review Code 1- Verbalizes Understanding   Class Start Time 1150   Class Stop Time 1225   Class Time Calculation (min) 35 min      Core Videos: Exercise    Move It!  Clinical staff conducted group or individual video education with verbal and written material and guidebook.  Patient learns the recommended Pritikin exercise program. Exercise with the  goal of living a long, healthy life. Some of the health benefits of exercise include controlled diabetes, healthier blood pressure levels, improved cholesterol levels, improved heart and lung capacity, improved sleep, and better body composition. Everyone should speak with their doctor before starting or changing an exercise routine.  Biomechanical Limitations Clinical staff conducted group or individual video education with verbal and written material and guidebook.  Patient learns how biomechanical limitations can impact exercise and how we can mitigate and possibly overcome limitations to have an impactful and balanced exercise routine.  Body Composition Clinical staff conducted group or individual video education with verbal and written material and guidebook.  Patient learns that body composition (ratio of muscle mass to fat mass) is a key component to assessing overall fitness, rather than body weight alone. Increased fat mass, especially visceral belly fat, can put us  at increased risk for metabolic syndrome, type 2 diabetes, heart disease, and even death. It is recommended to combine diet and exercise (cardiovascular and  resistance training) to improve your body composition. Seek guidance from your physician and exercise physiologist before implementing an exercise routine.  Exercise Action Plan Clinical staff conducted group or individual video education with verbal and written material and guidebook.  Patient learns the recommended strategies to achieve and enjoy long-term exercise adherence, including variety, self-motivation, self-efficacy, and positive decision making. Benefits of exercise include fitness, good health, weight management, more energy, better sleep, less stress, and overall well-being.  Medical   Heart Disease Risk Reduction Clinical staff conducted group or individual video education with verbal and written material and guidebook.  Patient learns our heart is our most vital organ as it circulates oxygen, nutrients, white blood cells, and hormones throughout the entire body, and carries waste away. Data supports a plant-based eating plan like the Pritikin Program for its effectiveness in slowing progression of and reversing heart disease. The video provides a number of recommendations to address heart disease.   Metabolic Syndrome and Belly Fat  Clinical staff conducted group or individual video education with verbal and written material and guidebook.  Patient learns what metabolic syndrome is, how it leads to heart disease, and how one can reverse it and keep it from coming back. You have metabolic syndrome if you have 3 of the following 5 criteria: abdominal obesity, high blood pressure, high triglycerides, low HDL cholesterol, and high blood sugar.  Hypertension and Heart Disease Clinical staff conducted group or individual video education with verbal and written material and guidebook.  Patient learns that high blood pressure, or hypertension, is very common in the [place] . Hypertension is largely due to excessive salt intake, but other important risk factors include being overweight,  physical inactivity, drinking too much alcohol, smoking, and not eating enough potassium from fruits and vegetables. High blood pressure is a leading risk factor for heart attack, stroke, congestive heart failure, dementia, kidney failure, and premature death. Long-term effects of excessive salt intake include stiffening of the arteries and thickening of heart muscle and organ damage. Recommendations include ways to reduce hypertension and the risk of heart disease.  Diseases of Our Time - Focusing on Diabetes Clinical staff conducted group or individual video education with verbal and written material and guidebook.  Patient learns why the best way to stop diseases of our time is prevention, through food and other lifestyle changes. Medicine (such as prescription pills and surgeries) is often only a Band-Aid on the problem, not a long-term solution. Most common diseases of our time include obesity,  type 2 diabetes, hypertension, heart disease, and cancer. The Pritikin Program is recommended and has been proven to help reduce, reverse, and/or prevent the damaging effects of metabolic syndrome.  Nutrition   Overview of the Pritikin Eating Plan  Clinical staff conducted group or individual video education with verbal and written material and guidebook.  Patient learns about the Pritikin Eating Plan for disease risk reduction. The Pritikin Eating Plan emphasizes a wide variety of unrefined, minimally-processed carbohydrates, like fruits, vegetables, whole grains, and legumes. Go, Caution, and Stop food choices are explained. Plant-based and lean animal proteins are emphasized. Rationale provided for low sodium intake for blood pressure control, low added sugars for blood sugar stabilization, and low added fats and oils for coronary artery disease risk reduction and weight management.  Calorie Density  Clinical staff conducted group or individual video education with verbal and written material and  guidebook.  Patient learns about calorie density and how it impacts the Pritikin Eating Plan. Knowing the characteristics of the food you choose will help you decide whether those foods will lead to weight gain or weight loss, and whether you want to consume more or less of them. Weight loss is usually a side effect of the Pritikin Eating Plan because of its focus on low calorie-dense foods.  Label Reading  Clinical staff conducted group or individual video education with verbal and written material and guidebook.  Patient learns about the Pritikin recommended label reading guidelines and corresponding recommendations regarding calorie density, added sugars, sodium content, and whole grains.  Dining Out - Part 1  Clinical staff conducted group or individual video education with verbal and written material and guidebook.  Patient learns that restaurant meals can be sabotaging because they can be so high in calories, fat, sodium, and/or sugar. Patient learns recommended strategies on how to positively address this and avoid unhealthy pitfalls.  Facts on Fats  Clinical staff conducted group or individual video education with verbal and written material and guidebook.  Patient learns that lifestyle modifications can be just as effective, if not more so, as many medications for lowering your risk of heart disease. A Pritikin lifestyle can help to reduce your risk of inflammation and atherosclerosis (cholesterol build-up, or plaque, in the artery walls). Lifestyle interventions such as dietary choices and physical activity address the cause of atherosclerosis. A review of the types of fats and their impact on blood cholesterol levels, along with dietary recommendations to reduce fat intake is also included.  Nutrition Action Plan  Clinical staff conducted group or individual video education with verbal and written material and guidebook.  Patient learns how to incorporate Pritikin recommendations into  their lifestyle. Recommendations include planning and keeping personal health goals in mind as an important part of their success.  Healthy Mind-Set    Healthy Minds, Bodies, Hearts  Clinical staff conducted group or individual video education with verbal and written material and guidebook.  Patient learns how to identify when they are stressed. Video will discuss the impact of that stress, as well as the many benefits of stress management. Patient will also be introduced to stress management techniques. The way we think, act, and feel has an impact on our hearts.  How Our Thoughts Can Heal Our Hearts  Clinical staff conducted group or individual video education with verbal and written material and guidebook.  Patient learns that negative thoughts can cause depression and anxiety. This can result in negative lifestyle behavior and serious health problems. Cognitive behavioral therapy is an  effective method to help control our thoughts in order to change and improve our emotional outlook.  Additional Videos:  Exercise    Improving Performance  Clinical staff conducted group or individual video education with verbal and written material and guidebook.  Patient learns to use a non-linear approach by alternating intensity levels and lengths of time spent exercising to help burn more calories and lose more body fat. Cardiovascular exercise helps improve heart health, metabolism, hormonal balance, blood sugar control, and recovery from fatigue. Resistance training improves strength, endurance, balance, coordination, reaction time, metabolism, and muscle mass. Flexibility exercise improves circulation, posture, and balance. Seek guidance from your physician and exercise physiologist before implementing an exercise routine and learn your capabilities and proper form for all exercise.  Introduction to Yoga  Clinical staff conducted group or individual video education with verbal and written material and  guidebook.  Patient learns about yoga, a discipline of the coming together of mind, breath, and body. The benefits of yoga include improved flexibility, improved range of motion, better posture and core strength, increased lung function, weight loss, and positive self-image. Yoga's heart health benefits include lowered blood pressure, healthier heart rate, decreased cholesterol and triglyceride levels, improved immune function, and reduced stress. Seek guidance from your physician and exercise physiologist before implementing an exercise routine and learn your capabilities and proper form for all exercise.  Medical   Aging: Enhancing Your Quality of Life  Clinical staff conducted group or individual video education with verbal and written material and guidebook.  Patient learns key strategies and recommendations to stay in good physical health and enhance quality of life, such as prevention strategies, having an advocate, securing a Health Care Proxy and Power of Attorney, and keeping a list of medications and system for tracking them. It also discusses how to avoid risk for bone loss.  Biology of Weight Control  Clinical staff conducted group or individual video education with verbal and written material and guidebook.  Patient learns that weight gain occurs because we consume more calories than we burn (eating more, moving less). Even if your body weight is normal, you may have higher ratios of fat compared to muscle mass. Too much body fat puts you at increased risk for cardiovascular disease, heart attack, stroke, type 2 diabetes, and obesity-related cancers. In addition to exercise, following the Pritikin Eating Plan can help reduce your risk.  Decoding Lab Results  Clinical staff conducted group or individual video education with verbal and written material and guidebook.  Patient learns that lab test reflects one measurement whose values change over time and are influenced by many factors,  including medication, stress, sleep, exercise, food, hydration, pre-existing medical conditions, and more. It is recommended to use the knowledge from this video to become more involved with your lab results and evaluate your numbers to speak with your doctor.   Diseases of Our Time - Overview  Clinical staff conducted group or individual video education with verbal and written material and guidebook.  Patient learns that according to the CDC, 50% to 70% of chronic diseases (such as obesity, type 2 diabetes, elevated lipids, hypertension, and heart disease) are avoidable through lifestyle improvements including healthier food choices, listening to satiety cues, and increased physical activity.  Sleep Disorders Clinical staff conducted group or individual video education with verbal and written material and guidebook.  Patient learns how good quality and duration of sleep are important to overall health and well-being. Patient also learns about sleep disorders and how they  impact health along with recommendations to address them, including discussing with a physician.  Nutrition  Dining Out - Part 2 Clinical staff conducted group or individual video education with verbal and written material and guidebook.  Patient learns how to plan ahead and communicate in order to maximize their dining experience in a healthy and nutritious manner. Included are recommended food choices based on the type of restaurant the patient is visiting.   Fueling a Banker conducted group or individual video education with verbal and written material and guidebook.  There is a strong connection between our food choices and our health. Diseases like obesity and type 2 diabetes are very prevalent and are in large-part due to lifestyle choices. The Pritikin Eating Plan provides plenty of food and hunger-curbing satisfaction. It is easy to follow, affordable, and helps reduce health risks.  Menu Workshop   Clinical staff conducted group or individual video education with verbal and written material and guidebook.  Patient learns that restaurant meals can sabotage health goals because they are often packed with calories, fat, sodium, and sugar. Recommendations include strategies to plan ahead and to communicate with the manager, chef, or server to help order a healthier meal.  Planning Your Eating Strategy  Clinical staff conducted group or individual video education with verbal and written material and guidebook.  Patient learns about the Pritikin Eating Plan and its benefit of reducing the risk of disease. The Pritikin Eating Plan does not focus on calories. Instead, it emphasizes high-quality, nutrient-rich foods. By knowing the characteristics of the foods, we choose, we can determine their calorie density and make informed decisions.  Targeting Your Nutrition Priorities  Clinical staff conducted group or individual video education with verbal and written material and guidebook.  Patient learns that lifestyle habits have a tremendous impact on disease risk and progression. This video provides eating and physical activity recommendations based on your personal health goals, such as reducing LDL cholesterol, losing weight, preventing or controlling type 2 diabetes, and reducing high blood pressure.  Vitamins and Minerals  Clinical staff conducted group or individual video education with verbal and written material and guidebook.  Patient learns different ways to obtain key vitamins and minerals, including through a recommended healthy diet. It is important to discuss all supplements you take with your doctor.   Healthy Mind-Set    Smoking Cessation  Clinical staff conducted group or individual video education with verbal and written material and guidebook.  Patient learns that cigarette smoking and tobacco addiction pose a serious health risk which affects millions of people. Stopping smoking  will significantly reduce the risk of heart disease, lung disease, and many forms of cancer. Recommended strategies for quitting are covered, including working with your doctor to develop a successful plan.  Culinary   Becoming a Set designer conducted group or individual video education with verbal and written material and guidebook.  Patient learns that cooking at home can be healthy, cost-effective, quick, and puts them in control. Keys to cooking healthy recipes will include looking at your recipe, assessing your equipment needs, planning ahead, making it simple, choosing cost-effective seasonal ingredients, and limiting the use of added fats, salts, and sugars.  Cooking - Breakfast and Snacks  Clinical staff conducted group or individual video education with verbal and written material and guidebook.  Patient learns how important breakfast is to satiety and nutrition through the entire day. Recommendations include key foods to eat during breakfast to help stabilize  blood sugar levels and to prevent overeating at meals later in the day. Planning ahead is also a key component.  Cooking - Educational psychologist conducted group or individual video education with verbal and written material and guidebook.  Patient learns eating strategies to improve overall health, including an approach to cook more at home. Recommendations include thinking of animal protein as a side on your plate rather than center stage and focusing instead on lower calorie dense options like vegetables, fruits, whole grains, and plant-based proteins, such as beans. Making sauces in large quantities to freeze for later and leaving the skin on your vegetables are also recommended to maximize your experience.  Cooking - Healthy Salads and Dressing Clinical staff conducted group or individual video education with verbal and written material and guidebook.  Patient learns that vegetables, fruits, whole  grains, and legumes are the foundations of the Pritikin Eating Plan. Recommendations include how to incorporate each of these in flavorful and healthy salads, and how to create homemade salad dressings. Proper handling of ingredients is also covered. Cooking - Soups and [employer] - Soups and Desserts Clinical staff conducted group or individual video education with verbal and written material and guidebook.  Patient learns that Pritikin soups and desserts make for easy, nutritious, and delicious snacks and meal components that are low in sodium, fat, sugar, and calorie density, while high in vitamins, minerals, and filling fiber. Recommendations include simple and healthy ideas for soups and desserts.   Overview     The Pritikin Solution Program Overview Clinical staff conducted group or individual video education with verbal and written material and guidebook.  Patient learns that the results of the Pritikin Program have been documented in more than 100 articles published in peer-reviewed journals, and the benefits include reducing risk factors for (and, in some cases, even reversing) high cholesterol, high blood pressure, type 2 diabetes, obesity, and more! An overview of the three key pillars of the Pritikin Program will be covered: eating well, doing regular exercise, and having a healthy mind-set.  WORKSHOPS  Exercise: Exercise Basics: Building Your Action Plan Clinical staff led group instruction and group discussion with PowerPoint presentation and patient guidebook. To enhance the learning environment the use of posters, models and videos may be added. At the conclusion of this workshop, patients will comprehend the difference between physical activity and exercise, as well as the benefits of incorporating both, into their routine. Patients will understand the FITT (Frequency, Intensity, Time, and Type) principle and how to use it to build an exercise action plan. In addition,  safety concerns and other considerations for exercise and cardiac rehab will be addressed by the presenter. The purpose of this lesson is to promote a comprehensive and effective weekly exercise routine in order to improve patients' overall level of fitness.   Managing Heart Disease: Your Path to a Healthier Heart Clinical staff led group instruction and group discussion with PowerPoint presentation and patient guidebook. To enhance the learning environment the use of posters, models and videos may be added.At the conclusion of this workshop, patients will understand the anatomy and physiology of the heart. Additionally, they will understand how Pritikin's three pillars impact the risk factors, the progression, and the management of heart disease.  The purpose of this lesson is to provide a high-level overview of the heart, heart disease, and how the Pritikin lifestyle positively impacts risk factors.  Exercise Biomechanics Clinical staff led group instruction and group discussion with PowerPoint  presentation and patient guidebook. To enhance the learning environment the use of posters, models and videos may be added. Patients will learn how the structural parts of their bodies function and how these functions impact their daily activities, movement, and exercise. Patients will learn how to promote a neutral spine, learn how to manage pain, and identify ways to improve their physical movement in order to promote healthy living. The purpose of this lesson is to expose patients to common physical limitations that impact physical activity. Participants will learn practical ways to adapt and manage aches and pains, and to minimize their effect on regular exercise. Patients will learn how to maintain good posture while sitting, walking, and lifting.  Balance Training and Fall Prevention  Clinical staff led group instruction and group discussion with PowerPoint presentation and patient guidebook. To  enhance the learning environment the use of posters, models and videos may be added. At the conclusion of this workshop, patients will understand the importance of their sensorimotor skills (vision, proprioception, and the vestibular system) in maintaining their ability to balance as they age. Patients will apply a variety of balancing exercises that are appropriate for their current level of function. Patients will understand the common causes for poor balance, possible solutions to these problems, and ways to modify their physical environment in order to minimize their fall risk. The purpose of this lesson is to teach patients about the importance of maintaining balance as they age and ways to minimize their risk of falling.  WORKSHOPS   Nutrition:  Fueling a Ship broker led group instruction and group discussion with PowerPoint presentation and patient guidebook. To enhance the learning environment the use of posters, models and videos may be added. Patients will review the foundational principles of the Pritikin Eating Plan and understand what constitutes a serving size in each of the food groups. Patients will also learn Pritikin-friendly foods that are better choices when away from home and review make-ahead meal and snack options. Calorie density will be reviewed and applied to three nutrition priorities: weight maintenance, weight loss, and weight gain. The purpose of this lesson is to reinforce (in a group setting) the key concepts around what patients are recommended to eat and how to apply these guidelines when away from home by planning and selecting Pritikin-friendly options. Patients will understand how calorie density may be adjusted for different weight management goals.  Mindful Eating  Clinical staff led group instruction and group discussion with PowerPoint presentation and patient guidebook. To enhance the learning environment the use of posters, models and videos may  be added. Patients will briefly review the concepts of the Pritikin Eating Plan and the importance of low-calorie dense foods. The concept of mindful eating will be introduced as well as the importance of paying attention to internal hunger signals. Triggers for non-hunger eating and techniques for dealing with triggers will be explored. The purpose of this lesson is to provide patients with the opportunity to review the basic principles of the Pritikin Eating Plan, discuss the value of eating mindfully and how to measure internal cues of hunger and fullness using the Hunger Scale. Patients will also discuss reasons for non-hunger eating and learn strategies to use for controlling emotional eating.  Targeting Your Nutrition Priorities Clinical staff led group instruction and group discussion with PowerPoint presentation and patient guidebook. To enhance the learning environment the use of posters, models and videos may be added. Patients will learn how to determine their genetic susceptibility to disease  by reviewing their family history. Patients will gain insight into the importance of diet as part of an overall healthy lifestyle in mitigating the impact of genetics and other environmental insults. The purpose of this lesson is to provide patients with the opportunity to assess their personal nutrition priorities by looking at their family history, their own health history and current risk factors. Patients will also be able to discuss ways of prioritizing and modifying the Pritikin Eating Plan for their highest risk areas  Menu  Clinical staff led group instruction and group discussion with PowerPoint presentation and patient guidebook. To enhance the learning environment the use of posters, models and videos may be added. Using menus brought in from [employer], or printed from [employer], patients will apply the Pritikin dining out guidelines that were presented in the [employer] video. Patients will also be able to practice these guidelines in a variety of provided scenarios. The purpose of this lesson is to provide patients with the opportunity to practice hands-on learning of the Pritikin Dining Out guidelines with actual menus and practice scenarios.  Label Reading Clinical staff led group instruction and group discussion with PowerPoint presentation and patient guidebook. To enhance the learning environment the use of posters, models and videos may be added. Patients will review and discuss the Pritikin label reading guidelines presented in Pritikin's Label Reading Educational series video. Using fool labels brought in from local grocery stores and markets, patients will apply the label reading guidelines and determine if the packaged food meet the Pritikin guidelines. The purpose of this lesson is to provide patients with the opportunity to review, discuss, and practice hands-on learning of the Pritikin Label Reading guidelines with actual packaged food labels. Cooking School  Pritikin's [employer] are designed to teach patients ways to prepare quick, simple, and affordable recipes at home. The importance of nutrition's role in chronic disease risk reduction is reflected in its emphasis in the overall Pritikin program. By learning how to prepare essential core Pritikin Eating Plan recipes, patients will increase control over what they eat; be able to customize the flavor of foods without the use of added salt, sugar, or fat; and improve the quality of the food they consume. By learning a set of core recipes which are easily assembled, quickly prepared, and affordable, patients are more likely to prepare more healthy foods at home. These workshops focus on convenient breakfasts, simple entres, side dishes, and desserts which can be prepared with minimal effort and are consistent with nutrition recommendations for cardiovascular risk reduction. Cooking  [employer] are taught by a Armed forces logistics/support/administrative officer (RD) who has been trained by the [employer]. The chef or RD has a clear understanding of the importance of minimizing - if not completely eliminating - added fat, sugar, and sodium in recipes. Throughout the series of Cooking School Workshop sessions, patients will learn about healthy ingredients and efficient methods of cooking to build confidence in their capability to prepare    Cooking School weekly topics:  Adding Flavor- Sodium-Free  Fast and Healthy Breakfasts  Powerhouse Plant-Based Proteins  Satisfying Salads and Dressings  Simple Sides and Sauces  International Cuisine-Spotlight on the [employer] Zones  Delicious Desserts  Savory Soups  [employer] - Meals in a Astronomer Appetizers and Snacks  Comforting Weekend Breakfasts  One-Pot Wonders   Fast Evening Meals  Landscape architect Your Pritikin Plate  WORKSHOPS   Healthy Mindset (Psychosocial):  Focused Goals, Sustainable Changes Clinical staff led group instruction and group discussion with PowerPoint presentation and patient guidebook. To enhance the learning environment the use of posters, models and videos may be added. Patients will be able to apply effective goal setting strategies to establish at least one personal goal, and then take consistent, meaningful action toward that goal. They will learn to identify common barriers to achieving personal goals and develop strategies to overcome them. Patients will also gain an understanding of how our mind-set can impact our ability to achieve goals and the importance of cultivating a positive and growth-oriented mind-set. The purpose of this lesson is to provide patients with a deeper understanding of how to set and achieve personal goals, as well as the tools and strategies needed to overcome common obstacles which may arise along the way.  From Head to Heart: The Power of a Healthy  Outlook  Clinical staff led group instruction and group discussion with PowerPoint presentation and patient guidebook. To enhance the learning environment the use of posters, models and videos may be added. Patients will be able to recognize and describe the impact of emotions and mood on physical health. They will discover the importance of self-care and explore self-care practices which may work for them. Patients will also learn how to utilize the 4 C's to cultivate a healthier outlook and better manage stress and challenges. The purpose of this lesson is to demonstrate to patients how a healthy outlook is an essential part of maintaining good health, especially as they continue their cardiac rehab journey.  Healthy Sleep for a Healthy Heart Clinical staff led group instruction and group discussion with PowerPoint presentation and patient guidebook. To enhance the learning environment the use of posters, models and videos may be added. At the conclusion of this workshop, patients will be able to demonstrate knowledge of the importance of sleep to overall health, well-being, and quality of life. They will understand the symptoms of, and treatments for, common sleep disorders. Patients will also be able to identify daytime and nighttime behaviors which impact sleep, and they will be able to apply these tools to help manage sleep-related challenges. The purpose of this lesson is to provide patients with a general overview of sleep and outline the importance of quality sleep. Patients will learn about a few of the most common sleep disorders. Patients will also be introduced to the concept of "sleep hygiene," and discover ways to self-manage certain sleeping problems through simple daily behavior changes. Finally, the workshop will motivate patients by clarifying the links between quality sleep and their goals of heart-healthy living.   Recognizing and Reducing Stress Clinical staff led group instruction and  group discussion with PowerPoint presentation and patient guidebook. To enhance the learning environment the use of posters, models and videos may be added. At the conclusion of this workshop, patients will be able to understand the types of stress reactions, differentiate between acute and chronic stress, and recognize the impact that chronic stress has on their health. They will also be able to apply different coping mechanisms, such as reframing negative self-talk. Patients will have the opportunity to practice a variety of stress management techniques, such as deep abdominal breathing, progressive muscle relaxation, and/or guided imagery.  The purpose of this lesson is to educate patients on the role of stress in their lives and to provide healthy techniques for coping with it.  Learning Barriers/Preferences:  Learning Barriers/Preferences - 05/04/24 1124       Learning Barriers/Preferences  Learning Barriers Sight   glasses   Learning Preferences Audio;Computer/Internet;Group Instruction;Skilled Demonstration;Verbal Instruction;Video;Written Material;Individual Instruction;Pictoral          Education Topics:  Knowledge Questionnaire Score:  Knowledge Questionnaire Score - 05/04/24 1147       Knowledge Questionnaire Score   Pre Score 23/24          Core Components/Risk Factors/Patient Goals at Admission:  Personal Goals and Risk Factors at Admission - 05/04/24 1125       Core Components/Risk Factors/Patient Goals on Admission    Weight Management Yes;Obesity    Intervention Weight Management: Develop a combined nutrition and exercise program designed to reach desired caloric intake, while maintaining appropriate intake of nutrient and fiber, sodium and fats, and appropriate energy expenditure required for the weight goal.;Weight Management: Provide education and appropriate resources to help participant work on and attain dietary goals.;Weight Management/Obesity: Establish  reasonable short term and long term weight goals.;Obesity: Provide education and appropriate resources to help participant work on and attain dietary goals.    Expected Outcomes Short Term: Continue to assess and modify interventions until short term weight is achieved;Long Term: Adherence to nutrition and physical activity/exercise program aimed toward attainment of established weight goal;Understanding recommendations for meals to include 15-35% energy as protein, 25-35% energy from fat, 35-60% energy from carbohydrates, less than 200mg  of dietary cholesterol, 20-35 gm of total fiber daily;Understanding of distribution of calorie intake throughout the day with the consumption of 4-5 meals/snacks    Tobacco Cessation Yes    Number of packs per day 1-2 cigars a month    Intervention Assist the participant in steps to quit. Provide individualized education and counseling about committing to Tobacco Cessation, relapse prevention, and pharmacological support that can be provided by physician.;Education officer, environmental, assist with locating and accessing local/national Quit Smoking programs, and support quit date choice.    Expected Outcomes Short Term: Will demonstrate readiness to quit, by selecting a quit date.;Long Term: Complete abstinence from all tobacco products for at least 12 months from quit date.;Short Term: Will quit all tobacco product use, adhering to prevention of relapse plan.    Hypertension Yes    Intervention Provide education on lifestyle modifcations including regular physical activity/exercise, weight management, moderate sodium restriction and increased consumption of fresh fruit, vegetables, and low fat dairy, alcohol moderation, and smoking cessation.;Monitor prescription use compliance.    Expected Outcomes Short Term: Continued assessment and intervention until BP is < 140/23mm HG in hypertensive participants. < 130/14mm HG in hypertensive participants with diabetes, heart failure  or chronic kidney disease.;Long Term: Maintenance of blood pressure at goal levels.    Lipids Yes    Intervention Provide education and support for participant on nutrition & aerobic/resistive exercise along with prescribed medications to achieve LDL 70mg , HDL >40mg .    Expected Outcomes Long Term: Cholesterol controlled with medications as prescribed, with individualized exercise RX and with personalized nutrition plan. Value goals: LDL < 70mg , HDL > 40 mg.;Short Term: Participant states understanding of desired cholesterol values and is compliant with medications prescribed. Participant is following exercise prescription and nutrition guidelines.          Core Components/Risk Factors/Patient Goals Review:   Goals and Risk Factor Review     Row Name 05/07/24 1714 05/18/24 0943 06/12/24 1712         Core Components/Risk Factors/Patient Goals Review   Personal Goals Review Weight Management/Obesity;Hypertension;Lipids Weight Management/Obesity;Hypertension;Lipids Weight Management/Obesity;Hypertension;Lipids     Review Timothy Grant started cardiac rehab on 05/07/24. Timothy Grant  did well with exercise. Vital signs were stable. Timothy Grant started cardiac rehab on 05/07/24. Timothy Grant is off to a good start with exercise. Vital signs have been stable. Timothy Grant has been increasing his mets. Timothy Grant is doing well  with exercise. Vital signs have been stable. Timothy Grant has been increasing his mets levels. Timothy Grant has lost 4 kg since starting cardiac rehab.     Expected Outcomes Timothy Grant will continue to participate in cardiac rehab for exercise, nutrition and lifestyle modifications Timothy Grant will continue to participate in cardiac rehab for exercise, nutrition and lifestyle modifications Timothy Grant will continue to participate in cardiac rehab for exercise, nutrition and lifestyle modifications        Core Components/Risk Factors/Patient Goals at Discharge (Final Review):   Goals and Risk Factor Review - 06/12/24 1712       Core Components/Risk  Factors/Patient Goals Review   Personal Goals Review Weight Management/Obesity;Hypertension;Lipids    Review Timothy Grant is doing well  with exercise. Vital signs have been stable. Timothy Grant has been increasing his mets levels. Timothy Grant has lost 4 kg since starting cardiac rehab.    Expected Outcomes Timothy Grant will continue to participate in cardiac rehab for exercise, nutrition and lifestyle modifications          ITP Comments:  ITP Comments     Row Name 05/04/24 1146 05/07/24 1712 05/18/24 0940 06/12/24 1708     ITP Comments Dr. Wilbert Bihari medical director. Introduction to pritikin education/intensive cardiac rehab. Initial orientation packet reviewed with patient. 30 Day ITP Review. Timothy Grant started cardiac rehab on 05/07/24. Timothy Grant did well with exercise. 30 Day ITP Review. Timothy Grant started cardiac rehab on 05/07/24. Timothy Grant is off to a good start with exercise. 30 Day ITP Review. Timothy Grant has good attendance and participation with exercise at  cardiac rehab       Comments: See ITP Comments

## 2024-06-20 ENCOUNTER — Encounter (HOSPITAL_COMMUNITY)
Admission: RE | Admit: 2024-06-20 | Discharge: 2024-06-20 | Disposition: A | Discharge status: Home / Self Care | Source: Ambulatory Visit | Attending: Cardiology

## 2024-06-20 DIAGNOSIS — I214 Non-ST elevation (NSTEMI) myocardial infarction: Secondary | ICD-10-CM

## 2024-06-22 ENCOUNTER — Telehealth (HOSPITAL_COMMUNITY): Payer: Self-pay | Admitting: *Deleted

## 2024-06-22 ENCOUNTER — Encounter (HOSPITAL_COMMUNITY)

## 2024-06-22 NOTE — Telephone Encounter (Signed)
 Timothy Grant left message Thursday afternoon indicating he will not attend cardiac rehab Friday due to wife having covid.

## 2024-06-25 ENCOUNTER — Encounter (HOSPITAL_COMMUNITY)
Admission: RE | Admit: 2024-06-25 | Discharge: 2024-06-25 | Disposition: A | Discharge status: Home / Self Care | Source: Ambulatory Visit | Attending: Cardiology | Admitting: Cardiology

## 2024-06-25 DIAGNOSIS — R051 Acute cough: Secondary | ICD-10-CM | POA: Diagnosis not present

## 2024-06-25 DIAGNOSIS — U071 COVID-19: Secondary | ICD-10-CM | POA: Diagnosis not present

## 2024-06-25 DIAGNOSIS — Z20822 Contact with and (suspected) exposure to covid-19: Secondary | ICD-10-CM | POA: Diagnosis not present

## 2024-06-25 DIAGNOSIS — Z03818 Encounter for observation for suspected exposure to other biological agents ruled out: Secondary | ICD-10-CM | POA: Diagnosis not present

## 2024-06-25 DIAGNOSIS — I214 Non-ST elevation (NSTEMI) myocardial infarction: Secondary | ICD-10-CM

## 2024-06-25 NOTE — Progress Notes (Signed)
 Incomplete Session Note  Patient Details  Name: Timothy Grant MRN: [id number] Date of Birth: November 19, 1946 Referring Provider:   Flowsheet Row INTENSIVE CARDIAC REHAB ORIENT from 05/04/2024 in [facility]  Referring Provider Peter [place], MD    Timothy Grant did not complete his rehab session.  Timothy Grant came to cardiac rehab and reported that his wife tested positive for COVID 19 on 06/21/24. Timothy Grant was noted to have a cough and a raspy voice. I advised Timothy Grant not to exercise today ina group setting based on his current symptoms. Recommended that Timothy Grant take a home Covid test. Patient asked to call and let us  know the results. Timothy Grant was wearing a mask. Patient asked to be free of symptoms for 48 hours prior to returning to group exercise. Patient states understanding.Hadassah Elpidio Quan RN BSN

## 2024-06-27 ENCOUNTER — Encounter (HOSPITAL_COMMUNITY): Admission: RE | Admit: 2024-06-27 | Source: Ambulatory Visit

## 2024-06-27 ENCOUNTER — Telehealth (HOSPITAL_COMMUNITY): Payer: Self-pay | Admitting: *Deleted

## 2024-06-27 NOTE — Telephone Encounter (Signed)
 Spoke with Timothy Grant he said he tested positive for COVID 19 on 06/26/24. Advised Timothy Grant to stay out of group exercise for 7 days and to be symptom free for 48 hours. Patient states understanding. Will cancel appointments until 07/04/24. Hadassah Elpidio Quan RN BSN

## 2024-06-27 NOTE — Telephone Encounter (Signed)
 duplicate

## 2024-06-29 ENCOUNTER — Encounter (HOSPITAL_COMMUNITY)

## 2024-07-02 ENCOUNTER — Encounter (HOSPITAL_COMMUNITY)

## 2024-07-03 NOTE — Progress Notes (Signed)
 Cardiology Office Note:    Date:  07/12/2024   ID:  Timothy Grant, DOB 04/30/1946, MRN [id number]  PCP:  Jodie Lavern CROME, MD   [facility] Providers Cardiologist:  Ranard Harte [place], MD     Referring MD: Jodie Lavern CROME, MD   Chief Complaint  Patient presents with   Coronary Artery Disease    History of Present Illness:    Timothy Grant is a 78 y.o. male is seen for follow up CAD. He was admitted on April 19th with NSTEMI. An EKG showed nonspecific ST segment changes, and his high sensitivity troponin was elevated at 982, peaking at 5412. He underwent cardiac catheterization revealing sequential 50% and 90% stenosis in the D1 artery, mild plaquing of the mid LAD, and 50% stenosis of the distal RCA extending into the RPAV branch. Medical management recommended with patient starting Plavix /ASA as well as Amlodipine  and Imdur  for angina. He developed Rhabdo on higher Crestor  dose. Was not at goal on lower Crestor  dose and Zetia  so seen by pharm D and started on PCSK 9 inhibitor.  On follow up today he is doing very well. No chest pain or dyspnea. Tolerating medication well. Active with cardiac rehab. Did have Covid recently but no significant symptoms.   Past Medical History:  Diagnosis Date   Adenomatous polyps    Allergy    seasonal   Arthritis    Cardiomegaly    Cataract    Cirrhosis [facility])    ED (erectile dysfunction)    Gallbladder polyp    Heart attack (HCC)    Hepatitis A    Hyperlipidemia    Hypothyroidism    Non-traumatic rhabdomyolysis 02/21/2019   Thrombocytopenia (HCC)    Varicocele 02/01/2011   Left testicle    Past Surgical History:  Procedure Laterality Date   CHOLECYSTECTOMY N/A 01/30/2021   Procedure: LAPAROSCOPIC CHOLECYSTECTOMY;  Surgeon: Dasie Leonor CROME, MD;  Location: [facility] ORS;  Service: General;  Laterality: N/A;  ROOM 1 STARTING AT 01:00PM   COLONOSCOPY  2014   EYE SURGERY Right 1956   EYE SURGERY Left 1963   LEFT HEART CATH AND CORONARY  ANGIOGRAPHY N/A 03/26/2024   Procedure: LEFT HEART CATH AND CORONARY ANGIOGRAPHY;  Surgeon: Mady Bruckner, MD;  Location: [facility] INVASIVE CV LAB;  Service: Cardiovascular;  Laterality: N/A;   LIVER BIOPSY     POLYPECTOMY     TONSILLECTOMY      Current Medications: Current Meds  Medication Sig   acetaminophen  (TYLENOL ) 500 MG tablet Take 1,000 mg by mouth daily as needed for headache.   amLODipine  (NORVASC ) 2.5 MG tablet Take 1 tablet (2.5 mg total) by mouth daily.   aspirin  EC 81 MG tablet Take 1 tablet (81 mg total) by mouth daily. Swallow whole.   Cetirizine HCl 10 MG CAPS Take 10 mg by mouth daily.   Cholecalciferol (VITAMIN D3) 125 MCG (5000 UT) CAPS Take 5,000 Units by mouth daily.   clopidogrel  (PLAVIX ) 75 MG tablet Take 1 tablet (75 mg total) by mouth daily.   cyanocobalamin  (VITAMIN B12) 1000 MCG tablet Take 1,000 mcg by mouth daily.   cyclobenzaprine  (FLEXERIL ) 10 MG tablet Take 1 tablet (10 mg total) by mouth 3 (three) times daily as needed for muscle spasms. (Patient taking differently: Take 10 mg by mouth at bedtime as needed for muscle spasms.)   Evolocumab  (REPATHA  SURECLICK) 140 MG/ML SOAJ Inject 140 mg into the skin every 14 (fourteen) days.   ezetimibe  (ZETIA ) 10 MG tablet Take 1 tablet (10  mg total) by mouth daily.   folic acid  (FOLVITE ) 1 MG tablet TAKE 1 TABLET BY MOUTH DAILY   Glucosamine HCl 1000 MG TABS Take 1 tablet by mouth daily.   isosorbide  mononitrate (IMDUR ) 30 MG 24 hr tablet Take 1 tablet (30 mg total) by mouth daily.   levothyroxine  (SYNTHROID ) 137 MCG tablet Take 1 tablet by mouth daily on Monday, Wednesday, Friday.   levothyroxine  (SYNTHROID ) 150 MCG tablet TAKE 1 TABLET BY MOUTH ON TUESDAY, THURSDAY, SATURDAY,AND SUNDAY   Multiple Vitamin (MULTIVITAMIN) capsule Take 1 capsule by mouth daily. Centrum Senior   nitroGLYCERIN  (NITROSTAT ) 0.4 MG SL tablet Place 1 tablet (0.4 mg total) under the tongue every 5 (five) minutes as needed for chest pain (up to 3  doses).   Omega-3 Fatty Acids (FISH OIL) 1200 MG CAPS Take 1,200 mg by mouth daily.   rosuvastatin  (CRESTOR ) 10 MG tablet TAKE 1 TABLET BY MOUTH DAILY (Patient taking differently: Take 10 mg by mouth at bedtime.)     Allergies:   Patient has no known allergies.   Social History   Socioeconomic History   Marital status: Married    Spouse name: Not on file   Number of children: 2   Years of education: Not on file   Highest education level: Bachelor's degree (e.g., BA, AB, BS)  Occupational History   Occupation: retired  Tobacco Use   Smoking status: Former    Types: Cigarettes, Cigars   Smokeless tobacco: Never   Tobacco comments:    quit 1975  Vaping Use   Vaping status: Never Used  Substance and Sexual Activity   Alcohol use: Not Currently    Alcohol/week: 6.0 standard drinks of alcohol    Types: 6 Cans of beer per week    Comment: Socially   Drug use: No   Sexual activity: Yes  Other Topics Concern   Not on file  Social History Narrative   Not on file   Social Drivers of Health   Financial Resource Strain: Low Risk  (05/21/2024)   Overall Financial Resource Strain (CARDIA)    Difficulty of Paying Living Expenses: Not hard at all  Food Insecurity: No Food Insecurity (05/21/2024)   Hunger Vital Sign    Worried About Running Out of Food in the Last Year: Never true    Ran Out of Food in the Last Year: Never true  Transportation Needs: No Transportation Needs (05/21/2024)   PRAPARE - Administrator, Civil Service (Medical): No    Lack of Transportation (Non-Medical): No  Physical Activity: Sufficiently Active (05/21/2024)   Exercise Vital Sign    Days of Exercise per Week: 7 days    Minutes of Exercise per Session: 30 min  Recent Concern: Physical Activity - Insufficiently Active (05/18/2024)   Exercise Vital Sign    Days of Exercise per Week: 7 days    Minutes of Exercise per Session: 20 min  Stress: No Stress Concern Present (05/21/2024)   [employer] of Occupational [employer] - Occupational Stress Questionnaire    Feeling of Stress: Not at all  Social Connections: Moderately Isolated (05/21/2024)   Social Connection and Isolation Panel    Frequency of Communication with Friends and Family: More than three times a week    Frequency of Social Gatherings with Friends and Family: More than three times a week    Attends Religious Services: Never    Database administrator or Organizations: No    Attends Banker Meetings:  Never    Marital Status: Married     Family History: The patient's family history includes Alzheimer's disease in his mother; Arthritis in his mother; Breast cancer in his brother; Colon polyps in his brother; Dementia in his mother; Heart disease in his father and paternal grandmother; Hyperlipidemia in his brother; Lung cancer in his brother; Stroke in his brother and mother; Uterine cancer in his sister. There is no history of Colon cancer, Esophageal cancer, Rectal cancer, or Stomach cancer.  ROS:   Please see the history of present illness.     All other systems reviewed and are negative.  EKGs/Labs/Other Studies Reviewed:    The following studies were reviewed today: 03/26/24 TTE   IMPRESSIONS     1. Left ventricular ejection fraction, by estimation, is 55 to 60%. The  left ventricle has normal function. The left ventricle demonstrates  regional wall motion abnormalities with mild anterior and anterolateral  hypokinesis. Left ventricular diastolic  parameters are consistent with Grade I diastolic dysfunction (impaired  relaxation).   2. Right ventricular systolic function is normal. The right ventricular  size is normal. There is normal pulmonary artery systolic pressure. The  estimated right ventricular systolic pressure is 21.1 mmHg.   3. Left atrial size was mildly dilated.   4. The mitral valve is normal in structure. No evidence of mitral valve  regurgitation. No evidence of mitral  stenosis.   5. The aortic valve is tricuspid. There is mild calcification of the  aortic valve. Aortic valve regurgitation is mild. No aortic stenosis is  present.   6. Aortic dilatation noted. There is mild dilatation of the aortic root,  measuring 40 mm. There is mild dilatation of the ascending aorta,  measuring 43 mm.   7. The inferior vena cava is normal in size with greater than 50%  respiratory variability, suggesting right atrial pressure of 3 mmHg.   LEFT HEART CATH AND CORONARY ANGIOGRAPHY   Conclusion  Conclusions: Significant single-vessel coronary artery disease with sequential 50% and 90% stenoses involving moderate-caliber D1 branch.  There is also mild plaquing of the mid LAD and 50% stenosis of the distal RCA extending into RPAV branch. Normal left ventricular systolic function (LVEF 55-65%) with mildly elevated filling pressure (LVEDP 20 mmHg).   Conditions: Favor medical therapy including up to 12 months of dual antiplatelet therapy with aspirin  and clopidogrel , as tolerated.  Continue amlodipine  for antianginal therapy.  Consider adding ranolazine if chest pain recurs, given baseline low resting heart rate and long-term use of tadalafil , precluding addition of a beta-blocker and long-acting nitrate at this time. If the patient has recurrent angina refractory to medical therapy, PCI to D1 should be considered. Aggressive secondary prevention of coronary artery disease; increase rosuvastatin  to 20 mg daily. Follow-up echocardiogram.   Lonni Hanson, MD [facility]      Recent Labs: 02/02/2024: TSH 3.71 03/16/2024: ALT 27 03/26/2024: BUN 10; Creatinine, Ser 1.01; Hemoglobin 12.4; Platelets 105; Potassium 4.3; Sodium 136  Recent Lipid Panel    Component Value Date/Time   CHOL 124 05/09/2024 1013   TRIG 60 05/09/2024 1013   HDL 48 05/09/2024 1013   CHOLHDL 2.6 05/09/2024 1013   CHOLHDL 2.8 03/25/2024 0806   VLDL 7 03/25/2024 0806   LDLCALC 63 05/09/2024 1013    LDLCALC 82 08/25/2020 0825     Risk Assessment/Calculations:                Physical Exam:    VS:  BP 109/74 (BP  Location: Right Arm)   Pulse 68   Ht 6' (1.829 m)   Wt 218 lb (98.9 kg)   SpO2 94%   BMI 29.57 kg/m     Wt Readings from Last 3 Encounters:  07/12/24 218 lb (98.9 kg)  05/21/24 221 lb 8 oz (100.5 kg)  05/04/24 230 lb 2.6 oz (104.4 kg)     GEN:  Well nourished, well developed in no acute distress HEENT: Normal NECK: No JVD; No carotid bruits LYMPHATICS: No lymphadenopathy CARDIAC: RRR, no murmurs, rubs, gallops RESPIRATORY:  Clear to auscultation without rales, wheezing or rhonchi  ABDOMEN: Soft, non-tender, non-distended MUSCULOSKELETAL:  No edema; No deformity  SKIN: Warm and dry NEUROLOGIC:  Alert and oriented x 3 PSYCHIATRIC:  Normal affect   ASSESSMENT:    1. Coronary artery disease of native artery of native heart with stable angina pectoris (HCC)   2. Mixed hyperlipidemia   3. Elevated blood pressure reading without diagnosis of hypertension    PLAN:    In order of problems listed above:  CAD s/p NSTEMI. Diagonal stenosis managed medically with no recurrent angina. Continue amlodipine  and Imdur . DAPT for one year. Risk factor modification HLD on Crestor  and Zetia . Now on Repatha . Will update lab next week    Follow up in 6 months       Medication Adjustments/Labs and Tests Ordered: Current medicines are reviewed at length with the patient today.  Concerns regarding medicines are outlined above.  Orders Placed This Encounter  Procedures   Lipid panel   No orders of the defined types were placed in this encounter.   There are no Patient Instructions on file for this visit.   Signed, Macayla Ekdahl [place], MD  07/12/2024 9:30 AM    [facility]

## 2024-07-04 ENCOUNTER — Encounter (HOSPITAL_COMMUNITY): Admission: RE | Admit: 2024-07-04 | Source: Ambulatory Visit

## 2024-07-06 ENCOUNTER — Encounter (HOSPITAL_COMMUNITY)
Admission: RE | Admit: 2024-07-06 | Discharge: 2024-07-06 | Disposition: A | Discharge status: Home / Self Care | Source: Ambulatory Visit | Attending: Cardiology | Admitting: Cardiology

## 2024-07-06 DIAGNOSIS — I214 Non-ST elevation (NSTEMI) myocardial infarction: Secondary | ICD-10-CM | POA: Diagnosis not present

## 2024-07-09 ENCOUNTER — Encounter (HOSPITAL_COMMUNITY)
Admission: RE | Admit: 2024-07-09 | Discharge: 2024-07-09 | Disposition: A | Discharge status: Home / Self Care | Source: Ambulatory Visit | Attending: Cardiology

## 2024-07-09 DIAGNOSIS — I214 Non-ST elevation (NSTEMI) myocardial infarction: Secondary | ICD-10-CM | POA: Diagnosis not present

## 2024-07-11 ENCOUNTER — Encounter (HOSPITAL_COMMUNITY)
Admission: RE | Admit: 2024-07-11 | Discharge: 2024-07-11 | Disposition: A | Discharge status: Home / Self Care | Source: Ambulatory Visit | Attending: Cardiology | Admitting: Cardiology

## 2024-07-11 DIAGNOSIS — I214 Non-ST elevation (NSTEMI) myocardial infarction: Secondary | ICD-10-CM

## 2024-07-12 ENCOUNTER — Encounter: Payer: Self-pay | Admitting: Cardiology

## 2024-07-12 ENCOUNTER — Ambulatory Visit: Attending: Cardiology | Admitting: Cardiology

## 2024-07-12 VITALS — BP 109/74 | HR 68 | Ht 72.0 in | Wt 218.0 lb

## 2024-07-12 DIAGNOSIS — E782 Mixed hyperlipidemia: Secondary | ICD-10-CM | POA: Diagnosis not present

## 2024-07-12 DIAGNOSIS — R030 Elevated blood-pressure reading, without diagnosis of hypertension: Secondary | ICD-10-CM | POA: Diagnosis not present

## 2024-07-12 DIAGNOSIS — I25118 Atherosclerotic heart disease of native coronary artery with other forms of angina pectoris: Secondary | ICD-10-CM | POA: Diagnosis not present

## 2024-07-12 NOTE — Patient Instructions (Signed)
 Medication Instructions:  Continue same medications *If you need a refill on your cardiac medications before your next appointment, please call your pharmacy*  Lab Work: Have lipid panel next week with other labs   Testing/Procedures: None ordered  Follow-Up: At [facility], you and your health needs are our priority.  As part of our continuing mission to provide you with exceptional heart care, our providers are all part of one team.  This team includes your primary Cardiologist (physician) and Advanced Practice Providers or APPs (Physician Assistants and Nurse Practitioners) who all work together to provide you with the care you need, when you need it.  Your next appointment:  6 months    Call in Oct to schedule Feb appointment     Provider:  Dr.Jordan   We recommend signing up for the patient portal called MyChart.  Sign up information is provided on this After Visit Summary.  MyChart is used to connect with patients for Virtual Visits (Telemedicine).  Patients are able to view lab/test results, encounter notes, upcoming appointments, etc.  Non-urgent messages can be sent to your provider as well.   To learn more about what you can do with MyChart, go to [email or web address].

## 2024-07-13 ENCOUNTER — Encounter (HOSPITAL_COMMUNITY)
Admission: RE | Admit: 2024-07-13 | Discharge: 2024-07-13 | Disposition: A | Discharge status: Home / Self Care | Source: Ambulatory Visit | Attending: Cardiology | Admitting: Cardiology

## 2024-07-13 DIAGNOSIS — I214 Non-ST elevation (NSTEMI) myocardial infarction: Secondary | ICD-10-CM

## 2024-07-15 ENCOUNTER — Other Ambulatory Visit: Payer: Self-pay | Admitting: Physician Assistant

## 2024-07-16 ENCOUNTER — Encounter (HOSPITAL_COMMUNITY)
Admission: RE | Admit: 2024-07-16 | Discharge: 2024-07-16 | Disposition: A | Discharge status: Home / Self Care | Source: Ambulatory Visit | Attending: Cardiology

## 2024-07-16 ENCOUNTER — Ambulatory Visit: Admitting: Hematology & Oncology

## 2024-07-16 ENCOUNTER — Inpatient Hospital Stay

## 2024-07-16 DIAGNOSIS — I214 Non-ST elevation (NSTEMI) myocardial infarction: Secondary | ICD-10-CM

## 2024-07-17 NOTE — Progress Notes (Signed)
 Cardiac Individual Treatment Plan  Patient Details  Name: Timothy Grant MRN: [id number] Date of Birth: Jun 20, 1946 Referring Provider:   Flowsheet Row INTENSIVE CARDIAC REHAB ORIENT from 05/04/2024 in [facility]  Referring Provider Peter Swazil[place], MD    Initial Encounter Date:  Flowsheet Row INTENSIVE CARDIAC REHAB ORIENT from 05/04/2024 in [facility]  Date 05/04/24    Visit Diagnosis: 03/09/24 NSTEMI (non-ST elevated myocardial infarction) [facility])  Patient's Home Medications on Admission:  Current Outpatient Medications:    acetaminophen  (TYLENOL ) 500 MG tablet, Take 1,000 mg by mouth daily as needed for headache., Disp: , Rfl:    amLODipine  (NORVASC ) 2.5 MG tablet, TAKE 1 TABLET BY MOUTH DAILY, Disp: 90 tablet, Rfl: 1   aspirin  EC 81 MG tablet, Take 1 tablet (81 mg total) by mouth daily. Swallow whole., Disp: 30 tablet, Rfl: 11   Cetirizine HCl 10 MG CAPS, Take 10 mg by mouth daily., Disp: , Rfl:    Cholecalciferol (VITAMIN D3) 125 MCG (5000 UT) CAPS, Take 5,000 Units by mouth daily., Disp: , Rfl:    clopidogrel  (PLAVIX ) 75 MG tablet, Take 1 tablet (75 mg total) by mouth daily., Disp: 30 tablet, Rfl: 11   cyanocobalamin  (VITAMIN B12) 1000 MCG tablet, Take 1,000 mcg by mouth daily., Disp: , Rfl:    cyclobenzaprine  (FLEXERIL ) 10 MG tablet, Take 1 tablet (10 mg total) by mouth 3 (three) times daily as needed for muscle spasms. (Patient taking differently: Take 10 mg by mouth at bedtime as needed for muscle spasms.), Disp: 90 tablet, Rfl: 3   Evolocumab  (REPATHA  SURECLICK) 140 MG/ML SOAJ, Inject 140 mg into the skin every 14 (fourteen) days., Disp: 2 mL, Rfl: 11   ezetimibe  (ZETIA ) 10 MG tablet, TAKE 1 TABLET BY MOUTH DAILY, Disp: 90 tablet, Rfl: 1   folic acid  (FOLVITE ) 1 MG tablet, TAKE 1 TABLET BY MOUTH DAILY, Disp: 90 tablet, Rfl: 1   Glucosamine HCl 1000 MG TABS, Take 1  tablet by mouth daily., Disp: , Rfl:    isosorbide  mononitrate (IMDUR ) 30 MG 24 hr tablet, TAKE 1 TABLET BY MOUTH DAILY, Disp: 90 tablet, Rfl: 1   levothyroxine  (SYNTHROID ) 137 MCG tablet, Take 1 tablet by mouth daily on Monday, Wednesday, Friday., Disp: 90 tablet, Rfl: 3   levothyroxine  (SYNTHROID ) 150 MCG tablet, TAKE 1 TABLET BY MOUTH ON TUESDAY, THURSDAY, SATURDAY,AND SUNDAY, Disp: 90 tablet, Rfl: 3   Multiple Vitamin (MULTIVITAMIN) capsule, Take 1 capsule by mouth daily. Centrum Senior, Disp: , Rfl:    nitroGLYCERIN  (NITROSTAT ) 0.4 MG SL tablet, Place 1 tablet (0.4 mg total) under the tongue every 5 (five) minutes as needed for chest pain (up to 3 doses)., Disp: 25 tablet, Rfl: 3   Omega-3 Fatty Acids (FISH OIL) 1200 MG CAPS, Take 1,200 mg by mouth daily., Disp: , Rfl:    rosuvastatin  (CRESTOR ) 10 MG tablet, TAKE 1 TABLET BY MOUTH DAILY (Patient taking differently: Take 10 mg by mouth at bedtime.), Disp: 90 tablet, Rfl: 3  Past Medical History: Past Medical History:  Diagnosis Date   Adenomatous polyps    Allergy    seasonal   Arthritis    Cardiomegaly    Cataract    Cirrhosis (HCC)    ED (erectile dysfunction)    Gallbladder polyp    Heart attack (HCC)    Hepatitis A    Hyperlipidemia    Hypothyroidism    Non-traumatic rhabdomyolysis 02/21/2019  Thrombocytopenia (HCC)    Varicocele 02/01/2011   Left testicle    Tobacco Use: Social History   Tobacco Use  Smoking Status Former   Types: Cigarettes, Cigars  Smokeless Tobacco Never  Tobacco Comments   quit 1975    Labs: Review Flowsheet  More data exists      Latest Ref Rng & Units 08/25/2020 05/18/2021 02/02/2024 03/25/2024 05/09/2024  Labs for ITP Cardiac and Pulmonary Rehab  Cholestrol 100 - 199 mg/dL 841  838  855  855  875   LDL (calc) 0 - 99 mg/dL 82  83  82  85  63   HDL-C >39 mg/dL 59  38.49  52.99  52  48   Trlycerides 0 - 149 mg/dL 88  16.9  22.9  37  60     Capillary Blood Glucose: No results found for:  GLUCAP   Exercise Target Goals: Exercise Program Goal: Individual exercise prescription set using results from initial 6 min walk test and THRR while considering  patient's activity barriers and safety.   Exercise Prescription Goal: Initial exercise prescription builds to 30-45 minutes a day of aerobic activity, 2-3 days per week.  Home exercise guidelines will be given to patient during program as part of exercise prescription that the participant will acknowledge.  Activity Barriers & Risk Stratification:  Activity Barriers & Cardiac Risk Stratification - 05/04/24 1123       Activity Barriers & Cardiac Risk Stratification   Activity Barriers Arthritis;History of Falls;Joint Problems    Cardiac Risk Stratification High   <5 METs on         6 Minute Walk:  6 Minute Walk     Row Name 05/04/24 1210         6 Minute Walk   Phase Initial     Distance 1500 feet     Walk Time 6 minutes     # of Rest Breaks 0     MPH 2.84     METS 2.59     RPE 9     Perceived Dyspnea  0     VO2 Peak 9.05     Symptoms Yes (comment)     Comments pt stated he felt swimmy when turning to sit down at completion of , BP 122/62. Resolved immediately with sitting. After sitting for a few minutes asked pt to stand, lightheaded/swimmy did not return. no other s/sx.     Resting HR 73 bpm     Resting BP 102/60     Resting Oxygen Saturation  97 %     Exercise Oxygen Saturation  during 6 min walk 97 %     Max Ex. HR 101 bpm     Max Ex. BP 122/62     2 Minute Post BP 114/60        Oxygen Initial Assessment:   Oxygen Re-Evaluation:   Oxygen Discharge (Final Oxygen Re-Evaluation):   Initial Exercise Prescription:  Initial Exercise Prescription - 05/04/24 1100       Date of Initial Exercise RX and Referring Provider   Date 05/04/24    Referring Provider Peter Swaz[place], MD    Expected Discharge Date 07/25/24      Bike   Level 1    Watts 40    Minutes 15    METs 2       NuStep   Level 2    SPM 70    Minutes 15    METs 2.2  Prescription Details   Frequency (times per week) 3    Duration Progress to 30 minutes of continuous aerobic without signs/symptoms of physical distress      Intensity   THRR 40-80% of Max Heartrate 57-114    Ratings of Perceived Exertion 11-13    Perceived Dyspnea 0-4      Progression   Progression Continue progressive overload as per policy without signs/symptoms or physical distress.      Resistance Training   Training Prescription Yes    Weight 3    Reps 10-15          Perform Capillary Blood Glucose checks as needed.  Exercise Prescription Changes:   Exercise Prescription Changes     Row Name 05/07/24 1039 05/14/24 1038 06/01/24 1035 06/18/24 1034 07/06/24 1031     Response to Exercise   Blood Pressure (Admit) 102/60 110/62 98/60 100/60 112/78   Blood Pressure (Exercise) 152/76 158/78 144/80 150/62 --   Blood Pressure (Exit) 100/62 104/72 92/64 110/70 102/64   Heart Rate (Admit) 78 bpm 69 bpm 63 bpm 67 bpm 60 bpm   Heart Rate (Exercise) 120 bpm 120 bpm 122 bpm 121 bpm 113 bpm   Heart Rate (Exit) 86 bpm 78 bpm 72 bpm 79 bpm 68 bpm   Symptoms None None None None None   Comments Off to a good start with exercise. -- Reviewd exercise prescription and goals with Timothy Grant. Increased workload on bike. -- Reviewed goals with Timothy Grant.   Duration Continue with 30 min of aerobic exercise without signs/symptoms of physical distress. Continue with 30 min of aerobic exercise without signs/symptoms of physical distress. Continue with 30 min of aerobic exercise without signs/symptoms of physical distress. Continue with 30 min of aerobic exercise without signs/symptoms of physical distress. Continue with 30 min of aerobic exercise without signs/symptoms of physical distress.   Intensity THRR unchanged THRR unchanged THRR unchanged THRR unchanged THRR unchanged     Progression   Progression Continue to progress workloads to  maintain intensity without signs/symptoms of physical distress. Continue to progress workloads to maintain intensity without signs/symptoms of physical distress. Continue to progress workloads to maintain intensity without signs/symptoms of physical distress. Continue to progress workloads to maintain intensity without signs/symptoms of physical distress. Continue to progress workloads to maintain intensity without signs/symptoms of physical distress.   Average METs 2.7 2.9 3.8 3.8 3.6     Resistance Training   Training Prescription Yes Yes Yes Yes Yes   Weight 3 lbs 3 lbs 5 lbs 5 lbs 5 lbs   Reps 10-15 10-15 10-15 10-15 10-15   Time 5 Minutes 5 Minutes 5 Minutes 5 Minutes 5 Minutes     Interval Training   Interval Training No No No No No     Bike   Level 1 1 2 2 2    Watts 21 22 48 41 47   Minutes 15 15 15 15 15    METs 2.6 2.6 3.5 3.3 3.4     NuStep   Level 1 1 2 2 2    SPM 100 118 119 135 124   Minutes 15 15 15 15 15    METs 2.8 3.3 4.2 4.2 3.9     Home Exercise Plan   Plans to continue exercise at -- -- Home (comment) Home (comment) Home (comment)   Frequency -- -- Add 4 additional days to program exercise sessions. Add 4 additional days to program exercise sessions. Add 4 additional days to program exercise sessions.   Initial Home  Exercises Provided -- -- 06/01/24 06/01/24 06/01/24    Row Name 07/09/24 1038             Response to Exercise   Blood Pressure (Admit) 108/60       Blood Pressure (Exit) 110/60       Heart Rate (Admit) 63 bpm       Heart Rate (Exercise) 120 bpm       Heart Rate (Exit) 72 bpm       Symptoms None       Duration Continue with 30 min of aerobic exercise without signs/symptoms of physical distress.       Intensity THRR unchanged         Progression   Progression Continue to progress workloads to maintain intensity without signs/symptoms of physical distress.       Average METs 3.8         Resistance Training   Training Prescription Yes        Weight 5 lbs       Reps 10-15       Time 5 Minutes         Interval Training   Interval Training No         Bike   Level 3       Watts 59       Minutes 15       METs 3.8         NuStep   Level 3       SPM 125       Minutes 15       METs 3.8         Home Exercise Plan   Plans to continue exercise at Home (comment)       Frequency Add 4 additional days to program exercise sessions.       Initial Home Exercises Provided 06/01/24          Exercise Comments:   Exercise Comments     Row Name 05/07/24 1139 05/18/24 1101 06/01/24 1104 06/25/24 1049 07/06/24 1113   Exercise Comments Timothy Grant tolerated low intensity exercise well without symptoms. Oriented him to the stretching and exercise routine. Stretching handout given. Reviewed METs and goals with Timothy Grant. Reviewed home exercise guidelines and goals with Timothy Grant. Patient out due to COVID. Reviewed goals with Timothy Grant.    Row Name 07/11/24 1113           Exercise Comments Reviewed METs with Timothy Grant.          Exercise Goals and Review:   Exercise Goals     Row Name 05/04/24 1124             Exercise Goals   Increase Physical Activity Yes       Intervention Provide advice, education, support and counseling about physical activity/exercise needs.;Develop an individualized exercise prescription for aerobic and resistive training based on initial evaluation findings, risk stratification, comorbidities and participant's personal goals.       Expected Outcomes Short Term: Attend rehab on a regular basis to increase amount of physical activity.;Long Term: Exercising regularly at least 3-5 days a week.;Long Term: Add in home exercise to make exercise part of routine and to increase amount of physical activity.       Increase Strength and Stamina Yes       Intervention Provide advice, education, support and counseling about physical activity/exercise needs.;Develop an individualized exercise prescription for aerobic and resistive training  based on initial evaluation findings, risk  stratification, comorbidities and participant's personal goals.       Expected Outcomes Short Term: Increase workloads from initial exercise prescription for resistance, speed, and METs.;Short Term: Perform resistance training exercises routinely during rehab and add in resistance training at home;Long Term: Improve cardiorespiratory fitness, muscular endurance and strength as measured by increased METs and functional capacity ( )       Able to understand and use rate of perceived exertion (RPE) scale Yes       Intervention Provide education and explanation on how to use RPE scale       Expected Outcomes Short Term: Able to use RPE daily in rehab to express subjective intensity level;Long Term:  Able to use RPE to guide intensity level when exercising independently       Knowledge and understanding of Target Heart Rate Range (THRR) Yes       Intervention Provide education and explanation of THRR including how the numbers were predicted and where they are located for reference       Expected Outcomes Short Term: Able to state/look up THRR;Short Term: Able to use daily as guideline for intensity in rehab;Long Term: Able to use THRR to govern intensity when exercising independently       Understanding of Exercise Prescription Yes       Intervention Provide education, explanation, and written materials on patient's individual exercise prescription       Expected Outcomes Short Term: Able to explain program exercise prescription;Long Term: Able to explain home exercise prescription to exercise independently          Exercise Goals Re-Evaluation :  Exercise Goals Re-Evaluation     Row Name 05/07/24 1139 05/18/24 1101 06/01/24 1104 06/25/24 1049 07/06/24 1113     Exercise Goal Re-Evaluation   Exercise Goals Review Increase Physical Activity;Increase Strength and Stamina;Able to understand and use rate of perceived exertion (RPE) scale Increase Physical  Activity;Increase Strength and Stamina;Able to understand and use rate of perceived exertion (RPE) scale;Knowledge and understanding of Target Heart Rate Range (THRR) Increase Physical Activity;Increase Strength and Stamina;Able to understand and use rate of perceived exertion (RPE) scale;Knowledge and understanding of Target Heart Rate Range (THRR);Understanding of Exercise Prescription Increase Physical Activity;Increase Strength and Stamina;Able to understand and use rate of perceived exertion (RPE) scale;Knowledge and understanding of Target Heart Rate Range (THRR);Understanding of Exercise Prescription Increase Physical Activity;Increase Strength and Stamina;Able to understand and use rate of perceived exertion (RPE) scale;Knowledge and understanding of Target Heart Rate Range (THRR);Understanding of Exercise Prescription   Comments Timothy Grant was able to understand and use RPE scale appropriately. Timothy Grant is walking 25 minutes twice/ day as his mode of home exercise. He is golfing and gardening without issue. Reviewed THRR with him. Reviewed exercise prescription with Timothy Grant. He's walking 20 minutes twice daily as his mode of home exercise. Timothy Grant reported today that his wife has COVID and he's symptomatic. No exercise today. Will review METs once cleared to return to exercise. Timothy Grant returned to exercise today after being out with COVID19. He is going a little slower today, and his goal is to get back to where he was with exercise prior to being out sick.   Expected Outcomes Progress workloads as tolerated to help increase cardiorespiratory fitness. Continue daily walking in addition to exercise at cardiac rehab. Continue daily walking to help achieve personal health and fitness goals. Review goals upon return to exercise. Progress workloads to rebuild strength and stamina.      Discharge Exercise Prescription (Final Exercise Prescription  Changes):  Exercise Prescription Changes - 07/09/24 1038       Response to  Exercise   Blood Pressure (Admit) 108/60    Blood Pressure (Exit) 110/60    Heart Rate (Admit) 63 bpm    Heart Rate (Exercise) 120 bpm    Heart Rate (Exit) 72 bpm    Symptoms None    Duration Continue with 30 min of aerobic exercise without signs/symptoms of physical distress.    Intensity THRR unchanged      Progression   Progression Continue to progress workloads to maintain intensity without signs/symptoms of physical distress.    Average METs 3.8      Resistance Training   Training Prescription Yes    Weight 5 lbs    Reps 10-15    Time 5 Minutes      Interval Training   Interval Training No      Bike   Level 3    Watts 59    Minutes 15    METs 3.8      NuStep   Level 3    SPM 125    Minutes 15    METs 3.8      Home Exercise Plan   Plans to continue exercise at Home (comment)    Frequency Add 4 additional days to program exercise sessions.    Initial Home Exercises Provided 06/01/24          Nutrition:  Target Goals: Understanding of nutrition guidelines, daily intake of sodium 1500mg , cholesterol 200mg , calories 30% from fat and 7% or less from saturated fats, daily to have 5 or more servings of fruits and vegetables.  Biometrics:  Pre Biometrics - 05/04/24 1121       Pre Biometrics   Waist Circumference 43.5 inches    Hip Circumference 43 inches    Waist to Hip Ratio 1.01 %    Triceps Skinfold 26 mm    % Body Fat 32.7 %    Grip Strength 34 kg    Flexibility 13.5 in    Single Leg Stand 2 seconds           Nutrition Therapy Plan and Nutrition Goals:  Nutrition Therapy & Goals - 07/11/24 1049       Nutrition Therapy   Diet heart healthy diet    Drug/Food Interactions Statins/Certain Fruits      Personal Nutrition Goals   Nutrition Goal Patient to identify strategies for reducing cardiovascular risk by attending the [facility] education and nutrition series weekly.   goal in action.   Personal Goal #2 Patient to improve diet quality by  using the plate method as a guide for meal planning to include lean protein/plant protein, fruits, vegetables, whole grains, nonfat dairy as part of a well-balanced diet.   goal in action.   Comments Goals in action. 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 LDL is not at goal; per cardiology documentation at 04/11/24 follow-up, Rhabdomyolysis on higher dose Crestor . Currently on Crestor  10 mg and Zetia  10 mg. Goal to reduce LDL to 55 or less due to multivessel coronary artery disease. Repatha /PCSK9 inhibitors started on 05/30/24. He continues regular follow-up with GI related to history of hepatitis A and cirrhosis. He is down 9# since starting with our program. Patient will benefit from participation in intensive cardiac rehab for nutrition education, exercise, and lifestyle  modification.      Intervention Plan   Intervention Prescribe, educate and counsel regarding individualized specific dietary modifications aiming towards targeted core components such as weight, hypertension, lipid management, diabetes, heart failure and other comorbidities.;Nutrition handout(s) given to patient.    Expected Outcomes Short Term Goal: Understand basic principles of dietary content, such as calories, fat, sodium, cholesterol and nutrients.;Long Term Goal: Adherence to prescribed nutrition plan.          Nutrition Assessments:  Nutrition Assessments - 05/08/24 1424       Rate Your Plate Scores   Pre Score 56         MEDIFICTS Score Key: >=70 Need to make dietary changes  40-70 Heart Healthy Diet <= 40 Therapeutic Level Cholesterol Diet   Flowsheet Row INTENSIVE CARDIAC REHAB from 05/07/2024 in [facility]  Picture Your Plate Total Score on  Admission 56   Picture Your Plate Scores: <59 Unhealthy dietary pattern with much room for improvement. 41-50 Dietary pattern unlikely to meet recommendations for good health and room for improvement. 51-60 More healthful dietary pattern, with some room for improvement.  >60 Healthy dietary pattern, although there may be some specific behaviors that could be improved.    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 Currently on Crestor  10 mg and Zetia  10 mg. Goal to reduce LDL to 55 or less due to multivessel coronary artery disease. Discussed potential use of PCSK9 inhibitors if cholesterol goals are not met with current regimen. He continues regular follow-up with GI related to history of hepatitis A and cirrhosis. Patient will benefit from participation in intensive cardiac rehab for nutrition education, exercise, and lifestyle modification. Goals in action. 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 LDL is not at goal; per cardiology documentation at 04/11/24 follow-up, Rhabdomyolysis on higher dose Crestor . Currently on Crestor  10 mg and Zetia  10 mg. Goal  to reduce LDL to 55 or less due to multivessel coronary artery disease. Repatha /PCSK9 inhibitors started on 05/30/24. He continues regular follow-up with GI related to history of hepatitis A and cirrhosis. He is down 8.8# since starting with our program. Patient will benefit from participation in intensive cardiac rehab for nutrition education, exercise, and lifestyle modification. Goals in action. 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 LDL is not at goal; per cardiology documentation at 04/11/24 follow-up, Rhabdomyolysis on higher dose Crestor . Currently on Crestor  10 mg and Zetia  10 mg. Goal to reduce LDL to  55 or less due to multivessel coronary artery disease. Repatha /PCSK9 inhibitors started on 05/30/24. He continues regular follow-up with GI related to history of hepatitis A and cirrhosis. He is down 9# since starting with our program. Patient will benefit from participation in intensive cardiac rehab for nutrition education, exercise, and lifestyle modification.        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 Currently on Crestor  10 mg and Zetia  10 mg.  Goal to reduce LDL to 55 or less due to multivessel coronary artery disease. Discussed potential use of PCSK9 inhibitors if cholesterol goals are not met with current regimen. He continues regular follow-up with GI related to history of hepatitis A and cirrhosis. Patient will benefit from participation in intensive cardiac rehab for nutrition education, exercise, and lifestyle modification. Goals in action. 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 LDL is not at goal; per cardiology documentation at 04/11/24 follow-up, Rhabdomyolysis on higher dose Crestor . Currently on Crestor  10 mg and Zetia  10 mg. Goal to reduce LDL to 55 or less due to multivessel coronary artery disease. Repatha /PCSK9 inhibitors started on 05/30/24. He continues regular follow-up with GI related to history of hepatitis A and cirrhosis. He is down 8.8# since starting with our program. Patient will benefit from participation in intensive cardiac rehab for nutrition education, exercise, and lifestyle modification. Goals in action. 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 LDL is not at goal; per cardiology documentation at 04/11/24 follow-up, Rhabdomyolysis on higher dose Crestor . Currently on Crestor  10 mg and Zetia  10 mg. Goal to reduce LDL to 55 or less due to multivessel coronary artery disease. Repatha /PCSK9 inhibitors started on 05/30/24. He continues regular follow-up with GI related to history of hepatitis A and cirrhosis. He is down 9# since starting with our program. Patient will benefit from participation in intensive  cardiac rehab for nutrition education, exercise, and lifestyle modification.        Nutrition Goals Discharge (Final Nutrition Goals Re-Evaluation):  Nutrition Goals Re-Evaluation - 07/11/24 1049       Goals   Current Weight 220 lb 14.4 oz (100.2 kg)    Comment LDL 65 (improved but not at goal of <55), Lpa 111.8    Expected Outcome Goals in action. 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 LDL is not at goal; per cardiology documentation at 04/11/24 follow-up, Rhabdomyolysis  on higher dose Crestor . Currently on Crestor  10 mg and Zetia  10 mg. Goal to reduce LDL to 55 or less due to multivessel coronary artery disease. Repatha /PCSK9 inhibitors started on 05/30/24. He continues regular follow-up with GI related to history of hepatitis A and cirrhosis. He is down 9# since starting with our program. Patient will benefit from participation in intensive cardiac rehab for nutrition education, exercise, and lifestyle modification.          Psychosocial: Target Goals: Acknowledge presence or absence of significant depression and/or stress, maximize coping skills, provide positive support system. Participant is able to verbalize types and ability to use techniques and skills needed for reducing stress and depression.  Initial Review & Psychosocial Screening:  Initial Psych Review & Screening - 05/04/24 1124       Initial Review   Current issues with None Identified      Family Dynamics   Good Support System? Yes   wife for support     Barriers   Psychosocial barriers to participate in program There are no identifiable barriers or psychosocial needs.      Screening Interventions   Interventions Encouraged to exercise;Provide feedback about the scores to participant    Expected Outcomes  Short Term goal: Identification and review with participant of any Quality of Life or Depression concerns found by scoring the questionnaire.;Long Term goal: The participant improves quality of Life and PHQ9 Scores as seen by post scores and/or verbalization of changes          Quality of Life Scores:  Quality of Life - 05/04/24 1210       Quality of Life   Select Quality of Life      Quality of Life Scores   Health/Function Pre 25.2 %    Socioeconomic Pre 25.43 %    Psych/Spiritual Pre 22.93 %    Family Pre 24 %    GLOBAL Pre 24.6 %         Scores of 19 and below usually indicate a poorer quality of life in these areas.  A difference of  2-3 points is a clinically meaningful difference.  A difference of 2-3 points in the total score of the Quality of Life Index has been associated with significant improvement in overall quality of life, self-image, physical symptoms, and general health in studies assessing change in quality of life.  PHQ-9: Review Flowsheet  More data exists      05/21/2024 05/04/2024 02/09/2024 05/16/2023 02/07/2023  Depression screen PHQ 2/9  Decreased Interest 0 0 0 0 0  Down, Depressed, Hopeless 0 0 0 0 0  PHQ - 2 Score 0 0 0 0 0  Altered sleeping 0 0 - - -  Tired, decreased energy 0 0 - - -  Change in appetite 0 1 - - -  Feeling bad or failure about yourself  0 1 - - -  Trouble concentrating 0 0 - - -  Moving slowly or fidgety/restless 0 0 - - -  Suicidal thoughts 0 0 - - -  PHQ-9 Score 0 2 - - -   Interpretation of Total Score  Total Score Depression Severity:  1-4 = Minimal depression, 5-9 = Mild depression, 10-14 = Moderate depression, 15-19 = Moderately severe depression, 20-27 = Severe depression   Psychosocial Evaluation and Intervention:   Psychosocial Re-Evaluation:  Psychosocial Re-Evaluation     Row Name 05/07/24 1714 05/18/24 0940 06/12/24 1709 07/17/24 1437       Psychosocial Re-Evaluation  Current issues with None Identified None  Identified None Identified None Identified    Comments -- Reviewed PHQ9. Timothy Grant said that he had been overeating. Timothy Grant says he is now making better choices. Timothy Grant says he is feeling better since starting cardiac rehab. -- Timothy Grant returned after being absent with COVID 19 infection    Interventions Encouraged to attend Cardiac Rehabilitation for the exercise Encouraged to attend Cardiac Rehabilitation for the exercise Encouraged to attend Cardiac Rehabilitation for the exercise Encouraged to attend Cardiac Rehabilitation for the exercise    Continue Psychosocial Services  No Follow up required No Follow up required No Follow up required No Follow up required       Psychosocial Discharge (Final Psychosocial Re-Evaluation):  Psychosocial Re-Evaluation - 07/17/24 1437       Psychosocial Re-Evaluation   Current issues with None Identified    Comments Timothy Grant returned after being absent with COVID 19 infection    Interventions Encouraged to attend Cardiac Rehabilitation for the exercise    Continue Psychosocial Services  No Follow up required          Vocational Rehabilitation: Provide vocational rehab assistance to qualifying candidates.   Vocational Rehab Evaluation & Intervention:  Vocational Rehab - 05/04/24 1125       Initial Vocational Rehab Evaluation & Intervention   Assessment shows need for Vocational Rehabilitation No   retired         Education: Education Goals: Education classes will be provided on a weekly basis, covering required topics. Participant will state understanding/return demonstration of topics presented.    Education     Row Name 05/07/24 1300     Education   Cardiac Education Topics Pritikin   Geographical information systems officer Exercise   Exercise Workshop Exercise Basics: Building Your Action Plan   Instruction Review Code 1- Verbalizes Understanding   Class Start Time 1157   Class Stop Time 1243   Class Time  Calculation (min) 46 min    Row Name 05/09/24 1500     Education   Cardiac Education Topics Pritikin   Customer service manager   Weekly Topic Efficiency Cooking - Meals in a Snap   Instruction Review Code 1- Verbalizes Understanding   Class Start Time 1145   Class Stop Time 1222   Class Time Calculation (min) 37 min    Row Name 05/11/24 0900     Education   Cardiac Education Topics Pritikin   Psychologist, forensic Exercise Education   Exercise Education Move It!   Instruction Review Code 1- Verbalizes Understanding   Class Start Time [phone]   Class Stop Time 0845   Class Time Calculation (min) 35 min    Row Name 05/14/24 1300     Education   Cardiac Education Topics Pritikin   Glass blower/designer Nutrition   Nutrition Workshop Targeting Your Nutrition Priorities   Instruction Review Code 1- Verbalizes Understanding   Class Start Time 1150   Class Stop Time 1230   Class Time Calculation (min) 40 min    Row Name 05/16/24 1300     Education   Cardiac Education Topics Pritikin   Heritage manager  One-Pot Wonders   Instruction Review Code 1- Verbalizes Understanding   Class Start Time 1145   Class Stop Time 1220   Class Time Calculation (min) 35 min    Row Name 05/18/24 1200     Education   Cardiac Education Topics Pritikin   Psychologist, forensic General Education   General Education Hypertension and Heart Disease   Instruction Review Code 1- Verbalizes Understanding   Class Start Time 1155   Class Stop Time 1232   Class Time Calculation (min) 37 min    Row Name 05/21/24 1400     Education   Cardiac Education Topics Pritikin   Consulting civil engineer Psychosocial   Psychosocial Workshop Focused Goals, Sustainable Changes   Instruction Review Code 1- Verbalizes Understanding   Class Start Time 1155   Class Stop Time 1230   Class Time Calculation (min) 35 min    Row Name 05/23/24 1300     Education   Cardiac Education Topics Pritikin   Customer service manager   Weekly Topic Comforting Weekend Breakfasts   Instruction Review Code 1- Verbalizes Understanding   Class Start Time 1145   Class Stop Time 1220   Class Time Calculation (min) 35 min    Row Name 05/25/24 1100     Education   Cardiac Education Topics Pritikin   Nurse, children's Exercise Physiologist   Select Nutrition   Nutrition Dining Out - Part 1   Instruction Review Code 1- Verbalizes Understanding   Class Start Time 1147   Class Stop Time 1226   Class Time Calculation (min) 39 min    Row Name 05/28/24 1100     Education   Cardiac Education Topics Pritikin   Psychologist, forensic Exercise Education   Exercise Education Biomechanial Limitations   Instruction Review Code 1- Verbalizes Understanding   Class Start Time 1155   Class Stop Time 1235   Class Time Calculation (min) 40 min    Row Name 05/30/24 1300     Education   Cardiac Education Topics Pritikin   Customer service manager   Weekly Topic Fast Evening Meals   Instruction Review Code 1- Verbalizes Understanding   Class Start Time 1145   Class Stop Time 1225   Class Time Calculation (min) 40 min    Row Name 06/01/24 1100     Education   Cardiac Education Topics Pritikin   Licensed conveyancer Nutrition   Nutrition Vitamins and Minerals   Instruction Review Code 1- Verbalizes Understanding   Class Start Time 1145   Class Stop Time 1225   Class Time Calculation (min) 40  min    Row Name 06/04/24 1600     Education   Cardiac Education Topics Pritikin   Glass blower/designer Nutrition   Nutrition Workshop Fueling a Forensic psychologist   Instruction Review Code 1- Tax inspector   Class Start Time 1145   Class Stop Time 1230   Class Time  Calculation (min) 45 min    Row Name 06/06/24 1500     Education   Cardiac Education Topics Pritikin   Customer service manager   Weekly Topic International Cuisine- Spotlight on the [employer] Zones   Instruction Review Code 1- Verbalizes Understanding   Class Start Time 1145   Class Stop Time 1225   Class Time Calculation (min) 40 min    Row Name 06/18/24 1200     Education   Cardiac Education Topics Pritikin   Hospital doctor Education   General Education Hypertension and Heart Disease   Instruction Review Code 1- Verbalizes Understanding   Class Start Time 1150   Class Stop Time 1225   Class Time Calculation (min) 35 min    Row Name 06/20/24 1100     Education   Cardiac Education Topics Pritikin   Customer service manager   Weekly Topic Powerhouse Plant-Based Proteins   Instruction Review Code 1- Verbalizes Understanding   Class Start Time 1145   Class Stop Time 1225   Class Time Calculation (min) 40 min    Row Name 06/25/24 1300     Education   Cardiac Education Topics Pritikin   Geographical information systems officer Psychosocial   Psychosocial Workshop From Head to Heart: The Power of a Healthy Outlook   Instruction Review Code 1- Verbalizes Understanding   Class Start Time 1152   Class Stop Time 1234   Class Time Calculation (min) 42 min    Row Name 07/06/24 1100     Education   Cardiac Education Topics Pritikin   Education officer, community Exercise   Exercise Workshop Location manager and Fall Prevention   Instruction Review Code 1- Verbalizes Understanding   Class Start Time 1150   Class Stop Time 1240   Class Time Calculation (min) 50 min    Row Name 07/09/24 1500     Education   Cardiac Education Topics Pritikin   Glass blower/designer Nutrition   Nutrition Workshop Label Reading   Instruction Review Code 1- Tax inspector   Class Start Time 1145   Class Stop Time 1230   Class Time Calculation (min) 45 min    Row Name 07/11/24 1000     Education   Cardiac Education Topics Pritikin   Customer service manager   Weekly Topic Fast and Healthy Breakfasts   Instruction Review Code 1- Verbalizes Understanding   Class Start Time 1145   Class Stop Time 1220   Class Time Calculation (min) 35 min    Row Name 07/13/24 1000     Education   Cardiac Education Topics Pritikin   Select Core Videos     Core Videos   Educator Dietitian   Select Nutrition   Nutrition Other   Instruction Review Code 1- Verbalizes Understanding   Class Start Time 1145   Class Stop Time 1225   Class Time Calculation (min) 40 min    Row Name 07/16/24 1100     Education   Cardiac Education Topics Pritikin   Select Core  Videos     Core Special educational needs teacher Education   General Education Metabolic Syndrome and Belly Fat   Instruction Review Code 1- Verbalizes Understanding   Class Start Time 1200   Class Stop Time 1235   Class Time Calculation (min) 35 min      Core Videos: Exercise    Move It!  Clinical staff conducted group or individual video education with verbal and written material and guidebook.  Patient learns the recommended Pritikin exercise program. Exercise with the goal of living a long, healthy life. Some of the health benefits of exercise include  controlled diabetes, healthier blood pressure levels, improved cholesterol levels, improved heart and lung capacity, improved sleep, and better body composition. Everyone should speak with their doctor before starting or changing an exercise routine.  Biomechanical Limitations Clinical staff conducted group or individual video education with verbal and written material and guidebook.  Patient learns how biomechanical limitations can impact exercise and how we can mitigate and possibly overcome limitations to have an impactful and balanced exercise routine.  Body Composition Clinical staff conducted group or individual video education with verbal and written material and guidebook.  Patient learns that body composition (ratio of muscle mass to fat mass) is a key component to assessing overall fitness, rather than body weight alone. Increased fat mass, especially visceral belly fat, can put us  at increased risk for metabolic syndrome, type 2 diabetes, heart disease, and even death. It is recommended to combine diet and exercise (cardiovascular and resistance training) to improve your body composition. Seek guidance from your physician and exercise physiologist before implementing an exercise routine.  Exercise Action Plan Clinical staff conducted group or individual video education with verbal and written material and guidebook.  Patient learns the recommended strategies to achieve and enjoy long-term exercise adherence, including variety, self-motivation, self-efficacy, and positive decision making. Benefits of exercise include fitness, good health, weight management, more energy, better sleep, less stress, and overall well-being.  Medical   Heart Disease Risk Reduction Clinical staff conducted group or individual video education with verbal and written material and guidebook.  Patient learns our heart is our most vital organ as it circulates oxygen, nutrients, white blood cells, and hormones  throughout the entire body, and carries waste away. Data supports a plant-based eating plan like the Pritikin Program for its effectiveness in slowing progression of and reversing heart disease. The video provides a number of recommendations to address heart disease.   Metabolic Syndrome and Belly Fat  Clinical staff conducted group or individual video education with verbal and written material and guidebook.  Patient learns what metabolic syndrome is, how it leads to heart disease, and how one can reverse it and keep it from coming back. You have metabolic syndrome if you have 3 of the following 5 criteria: abdominal obesity, high blood pressure, high triglycerides, low HDL cholesterol, and high blood sugar.  Hypertension and Heart Disease Clinical staff conducted group or individual video education with verbal and written material and guidebook.  Patient learns that high blood pressure, or hypertension, is very common in the [place] . Hypertension is largely due to excessive salt intake, but other important risk factors include being overweight, physical inactivity, drinking too much alcohol, smoking, and not eating enough potassium from fruits and vegetables. High blood pressure is a leading risk factor for heart attack, stroke, congestive heart failure, dementia, kidney failure, and premature death. Long-term effects of excessive salt intake include stiffening of  the arteries and thickening of heart muscle and organ damage. Recommendations include ways to reduce hypertension and the risk of heart disease.  Diseases of Our Time - Focusing on Diabetes Clinical staff conducted group or individual video education with verbal and written material and guidebook.  Patient learns why the best way to stop diseases of our time is prevention, through food and other lifestyle changes. Medicine (such as prescription pills and surgeries) is often only a Band-Aid on the problem, not a long-term solution. Most  common diseases of our time include obesity, type 2 diabetes, hypertension, heart disease, and cancer. The Pritikin Program is recommended and has been proven to help reduce, reverse, and/or prevent the damaging effects of metabolic syndrome.  Nutrition   Overview of the Pritikin Eating Plan  Clinical staff conducted group or individual video education with verbal and written material and guidebook.  Patient learns about the Pritikin Eating Plan for disease risk reduction. The Pritikin Eating Plan emphasizes a wide variety of unrefined, minimally-processed carbohydrates, like fruits, vegetables, whole grains, and legumes. Go, Caution, and Stop food choices are explained. Plant-based and lean animal proteins are emphasized. Rationale provided for low sodium intake for blood pressure control, low added sugars for blood sugar stabilization, and low added fats and oils for coronary artery disease risk reduction and weight management.  Calorie Density  Clinical staff conducted group or individual video education with verbal and written material and guidebook.  Patient learns about calorie density and how it impacts the Pritikin Eating Plan. Knowing the characteristics of the food you choose will help you decide whether those foods will lead to weight gain or weight loss, and whether you want to consume more or less of them. Weight loss is usually a side effect of the Pritikin Eating Plan because of its focus on low calorie-dense foods.  Label Reading  Clinical staff conducted group or individual video education with verbal and written material and guidebook.  Patient learns about the Pritikin recommended label reading guidelines and corresponding recommendations regarding calorie density, added sugars, sodium content, and whole grains.  Dining Out - Part 1  Clinical staff conducted group or individual video education with verbal and written material and guidebook.  Patient learns that restaurant meals  can be sabotaging because they can be so high in calories, fat, sodium, and/or sugar. Patient learns recommended strategies on how to positively address this and avoid unhealthy pitfalls.  Facts on Fats  Clinical staff conducted group or individual video education with verbal and written material and guidebook.  Patient learns that lifestyle modifications can be just as effective, if not more so, as many medications for lowering your risk of heart disease. A Pritikin lifestyle can help to reduce your risk of inflammation and atherosclerosis (cholesterol build-up, or plaque, in the artery walls). Lifestyle interventions such as dietary choices and physical activity address the cause of atherosclerosis. A review of the types of fats and their impact on blood cholesterol levels, along with dietary recommendations to reduce fat intake is also included.  Nutrition Action Plan  Clinical staff conducted group or individual video education with verbal and written material and guidebook.  Patient learns how to incorporate Pritikin recommendations into their lifestyle. Recommendations include planning and keeping personal health goals in mind as an important part of their success.  Healthy Mind-Set    Healthy Minds, Bodies, Hearts  Clinical staff conducted group or individual video education with verbal and written material and guidebook.  Patient learns how to identify  when they are stressed. Video will discuss the impact of that stress, as well as the many benefits of stress management. Patient will also be introduced to stress management techniques. The way we think, act, and feel has an impact on our hearts.  How Our Thoughts Can Heal Our Hearts  Clinical staff conducted group or individual video education with verbal and written material and guidebook.  Patient learns that negative thoughts can cause depression and anxiety. This can result in negative lifestyle behavior and serious health problems.  Cognitive behavioral therapy is an effective method to help control our thoughts in order to change and improve our emotional outlook.  Additional Videos:  Exercise    Improving Performance  Clinical staff conducted group or individual video education with verbal and written material and guidebook.  Patient learns to use a non-linear approach by alternating intensity levels and lengths of time spent exercising to help burn more calories and lose more body fat. Cardiovascular exercise helps improve heart health, metabolism, hormonal balance, blood sugar control, and recovery from fatigue. Resistance training improves strength, endurance, balance, coordination, reaction time, metabolism, and muscle mass. Flexibility exercise improves circulation, posture, and balance. Seek guidance from your physician and exercise physiologist before implementing an exercise routine and learn your capabilities and proper form for all exercise.  Introduction to Yoga  Clinical staff conducted group or individual video education with verbal and written material and guidebook.  Patient learns about yoga, a discipline of the coming together of mind, breath, and body. The benefits of yoga include improved flexibility, improved range of motion, better posture and core strength, increased lung function, weight loss, and positive self-image. Yoga's heart health benefits include lowered blood pressure, healthier heart rate, decreased cholesterol and triglyceride levels, improved immune function, and reduced stress. Seek guidance from your physician and exercise physiologist before implementing an exercise routine and learn your capabilities and proper form for all exercise.  Medical   Aging: Enhancing Your Quality of Life  Clinical staff conducted group or individual video education with verbal and written material and guidebook.  Patient learns key strategies and recommendations to stay in good physical health and enhance  quality of life, such as prevention strategies, having an advocate, securing a Health Care Proxy and Power of Attorney, and keeping a list of medications and system for tracking them. It also discusses how to avoid risk for bone loss.  Biology of Weight Control  Clinical staff conducted group or individual video education with verbal and written material and guidebook.  Patient learns that weight gain occurs because we consume more calories than we burn (eating more, moving less). Even if your body weight is normal, you may have higher ratios of fat compared to muscle mass. Too much body fat puts you at increased risk for cardiovascular disease, heart attack, stroke, type 2 diabetes, and obesity-related cancers. In addition to exercise, following the Pritikin Eating Plan can help reduce your risk.  Decoding Lab Results  Clinical staff conducted group or individual video education with verbal and written material and guidebook.  Patient learns that lab test reflects one measurement whose values change over time and are influenced by many factors, including medication, stress, sleep, exercise, food, hydration, pre-existing medical conditions, and more. It is recommended to use the knowledge from this video to become more involved with your lab results and evaluate your numbers to speak with your doctor.   Diseases of Our Time - Overview  Clinical staff conducted group or individual video education  with verbal and written material and guidebook.  Patient learns that according to the CDC, 50% to 70% of chronic diseases (such as obesity, type 2 diabetes, elevated lipids, hypertension, and heart disease) are avoidable through lifestyle improvements including healthier food choices, listening to satiety cues, and increased physical activity.  Sleep Disorders Clinical staff conducted group or individual video education with verbal and written material and guidebook.  Patient learns how good quality and  duration of sleep are important to overall health and well-being. Patient also learns about sleep disorders and how they impact health along with recommendations to address them, including discussing with a physician.  Nutrition  Dining Out - Part 2 Clinical staff conducted group or individual video education with verbal and written material and guidebook.  Patient learns how to plan ahead and communicate in order to maximize their dining experience in a healthy and nutritious manner. Included are recommended food choices based on the type of restaurant the patient is visiting.   Fueling a Banker conducted group or individual video education with verbal and written material and guidebook.  There is a strong connection between our food choices and our health. Diseases like obesity and type 2 diabetes are very prevalent and are in large-part due to lifestyle choices. The Pritikin Eating Plan provides plenty of food and hunger-curbing satisfaction. It is easy to follow, affordable, and helps reduce health risks.  Menu Workshop  Clinical staff conducted group or individual video education with verbal and written material and guidebook.  Patient learns that restaurant meals can sabotage health goals because they are often packed with calories, fat, sodium, and sugar. Recommendations include strategies to plan ahead and to communicate with the manager, chef, or server to help order a healthier meal.  Planning Your Eating Strategy  Clinical staff conducted group or individual video education with verbal and written material and guidebook.  Patient learns about the Pritikin Eating Plan and its benefit of reducing the risk of disease. The Pritikin Eating Plan does not focus on calories. Instead, it emphasizes high-quality, nutrient-rich foods. By knowing the characteristics of the foods, we choose, we can determine their calorie density and make informed decisions.  Targeting Your  Nutrition Priorities  Clinical staff conducted group or individual video education with verbal and written material and guidebook.  Patient learns that lifestyle habits have a tremendous impact on disease risk and progression. This video provides eating and physical activity recommendations based on your personal health goals, such as reducing LDL cholesterol, losing weight, preventing or controlling type 2 diabetes, and reducing high blood pressure.  Vitamins and Minerals  Clinical staff conducted group or individual video education with verbal and written material and guidebook.  Patient learns different ways to obtain key vitamins and minerals, including through a recommended healthy diet. It is important to discuss all supplements you take with your doctor.   Healthy Mind-Set    Smoking Cessation  Clinical staff conducted group or individual video education with verbal and written material and guidebook.  Patient learns that cigarette smoking and tobacco addiction pose a serious health risk which affects millions of people. Stopping smoking will significantly reduce the risk of heart disease, lung disease, and many forms of cancer. Recommended strategies for quitting are covered, including working with your doctor to develop a successful plan.  Culinary   Becoming a Set designer conducted group or individual video education with verbal and written material and guidebook.  Patient learns that cooking  at home can be healthy, cost-effective, quick, and puts them in control. Keys to cooking healthy recipes will include looking at your recipe, assessing your equipment needs, planning ahead, making it simple, choosing cost-effective seasonal ingredients, and limiting the use of added fats, salts, and sugars.  Cooking - Breakfast and Snacks  Clinical staff conducted group or individual video education with verbal and written material and guidebook.  Patient learns how important  breakfast is to satiety and nutrition through the entire day. Recommendations include key foods to eat during breakfast to help stabilize blood sugar levels and to prevent overeating at meals later in the day. Planning ahead is also a key component.  Cooking - Educational psychologist conducted group or individual video education with verbal and written material and guidebook.  Patient learns eating strategies to improve overall health, including an approach to cook more at home. Recommendations include thinking of animal protein as a side on your plate rather than center stage and focusing instead on lower calorie dense options like vegetables, fruits, whole grains, and plant-based proteins, such as beans. Making sauces in large quantities to freeze for later and leaving the skin on your vegetables are also recommended to maximize your experience.  Cooking - Healthy Salads and Dressing Clinical staff conducted group or individual video education with verbal and written material and guidebook.  Patient learns that vegetables, fruits, whole grains, and legumes are the foundations of the Pritikin Eating Plan. Recommendations include how to incorporate each of these in flavorful and healthy salads, and how to create homemade salad dressings. Proper handling of ingredients is also covered. Cooking - Soups and [employer] - Soups and Desserts Clinical staff conducted group or individual video education with verbal and written material and guidebook.  Patient learns that Pritikin soups and desserts make for easy, nutritious, and delicious snacks and meal components that are low in sodium, fat, sugar, and calorie density, while high in vitamins, minerals, and filling fiber. Recommendations include simple and healthy ideas for soups and desserts.   Overview     The Pritikin Solution Program Overview Clinical staff conducted group or individual video education with verbal and written material  and guidebook.  Patient learns that the results of the Pritikin Program have been documented in more than 100 articles published in peer-reviewed journals, and the benefits include reducing risk factors for (and, in some cases, even reversing) high cholesterol, high blood pressure, type 2 diabetes, obesity, and more! An overview of the three key pillars of the Pritikin Program will be covered: eating well, doing regular exercise, and having a healthy mind-set.  WORKSHOPS  Exercise: Exercise Basics: Building Your Action Plan Clinical staff led group instruction and group discussion with PowerPoint presentation and patient guidebook. To enhance the learning environment the use of posters, models and videos may be added. At the conclusion of this workshop, patients will comprehend the difference between physical activity and exercise, as well as the benefits of incorporating both, into their routine. Patients will understand the FITT (Frequency, Intensity, Time, and Type) principle and how to use it to build an exercise action plan. In addition, safety concerns and other considerations for exercise and cardiac rehab will be addressed by the presenter. The purpose of this lesson is to promote a comprehensive and effective weekly exercise routine in order to improve patients' overall level of fitness.   Managing Heart Disease: Your Path to a Healthier Heart Clinical staff led group instruction and group discussion with  PowerPoint presentation and patient guidebook. To enhance the learning environment the use of posters, models and videos may be added.At the conclusion of this workshop, patients will understand the anatomy and physiology of the heart. Additionally, they will understand how Pritikin's three pillars impact the risk factors, the progression, and the management of heart disease.  The purpose of this lesson is to provide a high-level overview of the heart, heart disease, and how the Pritikin  lifestyle positively impacts risk factors.  Exercise Biomechanics Clinical staff led group instruction and group discussion with PowerPoint presentation and patient guidebook. To enhance the learning environment the use of posters, models and videos may be added. Patients will learn how the structural parts of their bodies function and how these functions impact their daily activities, movement, and exercise. Patients will learn how to promote a neutral spine, learn how to manage pain, and identify ways to improve their physical movement in order to promote healthy living. The purpose of this lesson is to expose patients to common physical limitations that impact physical activity. Participants will learn practical ways to adapt and manage aches and pains, and to minimize their effect on regular exercise. Patients will learn how to maintain good posture while sitting, walking, and lifting.  Balance Training and Fall Prevention  Clinical staff led group instruction and group discussion with PowerPoint presentation and patient guidebook. To enhance the learning environment the use of posters, models and videos may be added. At the conclusion of this workshop, patients will understand the importance of their sensorimotor skills (vision, proprioception, and the vestibular system) in maintaining their ability to balance as they age. Patients will apply a variety of balancing exercises that are appropriate for their current level of function. Patients will understand the common causes for poor balance, possible solutions to these problems, and ways to modify their physical environment in order to minimize their fall risk. The purpose of this lesson is to teach patients about the importance of maintaining balance as they age and ways to minimize their risk of falling.  WORKSHOPS   Nutrition:  Fueling a Ship broker led group instruction and group discussion with PowerPoint presentation  and patient guidebook. To enhance the learning environment the use of posters, models and videos may be added. Patients will review the foundational principles of the Pritikin Eating Plan and understand what constitutes a serving size in each of the food groups. Patients will also learn Pritikin-friendly foods that are better choices when away from home and review make-ahead meal and snack options. Calorie density will be reviewed and applied to three nutrition priorities: weight maintenance, weight loss, and weight gain. The purpose of this lesson is to reinforce (in a group setting) the key concepts around what patients are recommended to eat and how to apply these guidelines when away from home by planning and selecting Pritikin-friendly options. Patients will understand how calorie density may be adjusted for different weight management goals.  Mindful Eating  Clinical staff led group instruction and group discussion with PowerPoint presentation and patient guidebook. To enhance the learning environment the use of posters, models and videos may be added. Patients will briefly review the concepts of the Pritikin Eating Plan and the importance of low-calorie dense foods. The concept of mindful eating will be introduced as well as the importance of paying attention to internal hunger signals. Triggers for non-hunger eating and techniques for dealing with triggers will be explored. The purpose of this lesson is to provide patients with  the opportunity to review the basic principles of the Pritikin Eating Plan, discuss the value of eating mindfully and how to measure internal cues of hunger and fullness using the Hunger Scale. Patients will also discuss reasons for non-hunger eating and learn strategies to use for controlling emotional eating.  Targeting Your Nutrition Priorities Clinical staff led group instruction and group discussion with PowerPoint presentation and patient guidebook. To enhance the  learning environment the use of posters, models and videos may be added. Patients will learn how to determine their genetic susceptibility to disease by reviewing their family history. Patients will gain insight into the importance of diet as part of an overall healthy lifestyle in mitigating the impact of genetics and other environmental insults. The purpose of this lesson is to provide patients with the opportunity to assess their personal nutrition priorities by looking at their family history, their own health history and current risk factors. Patients will also be able to discuss ways of prioritizing and modifying the Pritikin Eating Plan for their highest risk areas  Menu  Clinical staff led group instruction and group discussion with PowerPoint presentation and patient guidebook. To enhance the learning environment the use of posters, models and videos may be added. Using menus brought in from [employer], or printed from [employer], patients will apply the Pritikin dining out guidelines that were presented in the [employer] video. Patients will also be able to practice these guidelines in a variety of provided scenarios. The purpose of this lesson is to provide patients with the opportunity to practice hands-on learning of the Pritikin Dining Out guidelines with actual menus and practice scenarios.  Label Reading Clinical staff led group instruction and group discussion with PowerPoint presentation and patient guidebook. To enhance the learning environment the use of posters, models and videos may be added. Patients will review and discuss the Pritikin label reading guidelines presented in Pritikin's Label Reading Educational series video. Using fool labels brought in from local grocery stores and markets, patients will apply the label reading guidelines and determine if the packaged food meet the Pritikin guidelines. The purpose of this lesson is to provide patients with  the opportunity to review, discuss, and practice hands-on learning of the Pritikin Label Reading guidelines with actual packaged food labels. Cooking School  Pritikin's [employer] are designed to teach patients ways to prepare quick, simple, and affordable recipes at home. The importance of nutrition's role in chronic disease risk reduction is reflected in its emphasis in the overall Pritikin program. By learning how to prepare essential core Pritikin Eating Plan recipes, patients will increase control over what they eat; be able to customize the flavor of foods without the use of added salt, sugar, or fat; and improve the quality of the food they consume. By learning a set of core recipes which are easily assembled, quickly prepared, and affordable, patients are more likely to prepare more healthy foods at home. These workshops focus on convenient breakfasts, simple entres, side dishes, and desserts which can be prepared with minimal effort and are consistent with nutrition recommendations for cardiovascular risk reduction. Cooking [employer] are taught by a Armed forces logistics/support/administrative officer (RD) who has been trained by the [employer]. The chef or RD has a clear understanding of the importance of minimizing - if not completely eliminating - added fat, sugar, and sodium in recipes. Throughout the series of Cooking School Workshop sessions, patients will learn about healthy ingredients and efficient methods  of cooking to build confidence in their capability to prepare    Cooking School weekly topics:  Adding Flavor- Sodium-Free  Fast and Healthy Breakfasts  Powerhouse Plant-Based Proteins  Satisfying Salads and Dressings  Simple Sides and Sauces  International Cuisine-Spotlight on the [employer] Zones  Delicious Desserts  Savory Soups  [employer] - Meals in a Snap  Tasty Appetizers and Snacks  Comforting Weekend Breakfasts  One-Pot Wonders   Fast Evening  Meals  Landscape architect Your Pritikin Plate  WORKSHOPS   Healthy Mindset (Psychosocial):  Focused Goals, Sustainable Changes Clinical staff led group instruction and group discussion with PowerPoint presentation and patient guidebook. To enhance the learning environment the use of posters, models and videos may be added. Patients will be able to apply effective goal setting strategies to establish at least one personal goal, and then take consistent, meaningful action toward that goal. They will learn to identify common barriers to achieving personal goals and develop strategies to overcome them. Patients will also gain an understanding of how our mind-set can impact our ability to achieve goals and the importance of cultivating a positive and growth-oriented mind-set. The purpose of this lesson is to provide patients with a deeper understanding of how to set and achieve personal goals, as well as the tools and strategies needed to overcome common obstacles which may arise along the way.  From Head to Heart: The Power of a Healthy Outlook  Clinical staff led group instruction and group discussion with PowerPoint presentation and patient guidebook. To enhance the learning environment the use of posters, models and videos may be added. Patients will be able to recognize and describe the impact of emotions and mood on physical health. They will discover the importance of self-care and explore self-care practices which may work for them. Patients will also learn how to utilize the 4 C's to cultivate a healthier outlook and better manage stress and challenges. The purpose of this lesson is to demonstrate to patients how a healthy outlook is an essential part of maintaining good health, especially as they continue their cardiac rehab journey.  Healthy Sleep for a Healthy Heart Clinical staff led group instruction and group discussion with PowerPoint presentation and patient guidebook. To  enhance the learning environment the use of posters, models and videos may be added. At the conclusion of this workshop, patients will be able to demonstrate knowledge of the importance of sleep to overall health, well-being, and quality of life. They will understand the symptoms of, and treatments for, common sleep disorders. Patients will also be able to identify daytime and nighttime behaviors which impact sleep, and they will be able to apply these tools to help manage sleep-related challenges. The purpose of this lesson is to provide patients with a general overview of sleep and outline the importance of quality sleep. Patients will learn about a few of the most common sleep disorders. Patients will also be introduced to the concept of "sleep hygiene," and discover ways to self-manage certain sleeping problems through simple daily behavior changes. Finally, the workshop will motivate patients by clarifying the links between quality sleep and their goals of heart-healthy living.   Recognizing and Reducing Stress Clinical staff led group instruction and group discussion with PowerPoint presentation and patient guidebook. To enhance the learning environment the use of posters, models and videos may be added. At the conclusion of this workshop, patients will be able to understand the types of stress reactions, differentiate between acute and chronic stress,  and recognize the impact that chronic stress has on their health. They will also be able to apply different coping mechanisms, such as reframing negative self-talk. Patients will have the opportunity to practice a variety of stress management techniques, such as deep abdominal breathing, progressive muscle relaxation, and/or guided imagery.  The purpose of this lesson is to educate patients on the role of stress in their lives and to provide healthy techniques for coping with it.  Learning Barriers/Preferences:  Learning Barriers/Preferences - 05/04/24  1124       Learning Barriers/Preferences   Learning Barriers Sight   glasses   Learning Preferences Audio;Computer/Internet;Group Instruction;Skilled Demonstration;Verbal Instruction;Video;Written Material;Individual Instruction;Pictoral          Education Topics:  Knowledge Questionnaire Score:  Knowledge Questionnaire Score - 05/04/24 1147       Knowledge Questionnaire Score   Pre Score 23/24          Core Components/Risk Factors/Patient Goals at Admission:  Personal Goals and Risk Factors at Admission - 05/04/24 1125       Core Components/Risk Factors/Patient Goals on Admission    Weight Management Yes;Obesity    Intervention Weight Management: Develop a combined nutrition and exercise program designed to reach desired caloric intake, while maintaining appropriate intake of nutrient and fiber, sodium and fats, and appropriate energy expenditure required for the weight goal.;Weight Management: Provide education and appropriate resources to help participant work on and attain dietary goals.;Weight Management/Obesity: Establish reasonable short term and long term weight goals.;Obesity: Provide education and appropriate resources to help participant work on and attain dietary goals.    Expected Outcomes Short Term: Continue to assess and modify interventions until short term weight is achieved;Long Term: Adherence to nutrition and physical activity/exercise program aimed toward attainment of established weight goal;Understanding recommendations for meals to include 15-35% energy as protein, 25-35% energy from fat, 35-60% energy from carbohydrates, less than 200mg  of dietary cholesterol, 20-35 gm of total fiber daily;Understanding of distribution of calorie intake throughout the day with the consumption of 4-5 meals/snacks    Tobacco Cessation Yes    Number of packs per day 1-2 cigars a month    Intervention Assist the participant in steps to quit. Provide individualized education and  counseling about committing to Tobacco Cessation, relapse prevention, and pharmacological support that can be provided by physician.;Education officer, environmental, assist with locating and accessing local/national Quit Smoking programs, and support quit date choice.    Expected Outcomes Short Term: Will demonstrate readiness to quit, by selecting a quit date.;Long Term: Complete abstinence from all tobacco products for at least 12 months from quit date.;Short Term: Will quit all tobacco product use, adhering to prevention of relapse plan.    Hypertension Yes    Intervention Provide education on lifestyle modifcations including regular physical activity/exercise, weight management, moderate sodium restriction and increased consumption of fresh fruit, vegetables, and low fat dairy, alcohol moderation, and smoking cessation.;Monitor prescription use compliance.    Expected Outcomes Short Term: Continued assessment and intervention until BP is < 140/105mm HG in hypertensive participants. < 130/39mm HG in hypertensive participants with diabetes, heart failure or chronic kidney disease.;Long Term: Maintenance of blood pressure at goal levels.    Lipids Yes    Intervention Provide education and support for participant on nutrition & aerobic/resistive exercise along with prescribed medications to achieve LDL 70mg , HDL >40mg .    Expected Outcomes Long Term: Cholesterol controlled with medications as prescribed, with individualized exercise RX and with personalized nutrition plan. Value goals: LDL <  70mg , HDL > 40 mg.;Short Term: Participant states understanding of desired cholesterol values and is compliant with medications prescribed. Participant is following exercise prescription and nutrition guidelines.          Core Components/Risk Factors/Patient Goals Review:   Goals and Risk Factor Review     Row Name 05/07/24 1714 05/18/24 0943 06/12/24 1712 07/17/24 1440       Core Components/Risk Factors/Patient  Goals Review   Personal Goals Review Weight Management/Obesity;Hypertension;Lipids Weight Management/Obesity;Hypertension;Lipids Weight Management/Obesity;Hypertension;Lipids Weight Management/Obesity;Hypertension;Lipids    Review Timothy Grant started cardiac rehab on 05/07/24. Timothy Grant did well with exercise. Vital signs were stable. Timothy Grant started cardiac rehab on 05/07/24. Timothy Grant is off to a good start with exercise. Vital signs have been stable. Timothy Grant has been increasing his mets. Timothy Grant is doing well  with exercise. Vital signs have been stable. Timothy Grant has been increasing his mets levels. Timothy Grant has lost 4 kg since starting cardiac rehab. Timothy Grant continues to  do well  with exercise. Vital signs have been stable. Timothy Grant continues to  increase  his mets levels. Timothy Grant has lost 4.5 kg since starting cardiac rehab.    Expected Outcomes Timothy Grant will continue to participate in cardiac rehab for exercise, nutrition and lifestyle modifications Timothy Grant will continue to participate in cardiac rehab for exercise, nutrition and lifestyle modifications Timothy Grant will continue to participate in cardiac rehab for exercise, nutrition and lifestyle modifications Timothy Grant will continue to participate in cardiac rehab for exercise, nutrition and lifestyle modifications       Core Components/Risk Factors/Patient Goals at Discharge (Final Review):   Goals and Risk Factor Review - 07/17/24 1440       Core Components/Risk Factors/Patient Goals Review   Personal Goals Review Weight Management/Obesity;Hypertension;Lipids    Review Timothy Grant continues to  do well  with exercise. Vital signs have been stable. Timothy Grant continues to  increase  his mets levels. Timothy Grant has lost 4.5 kg since starting cardiac rehab.    Expected Outcomes Timothy Grant will continue to participate in cardiac rehab for exercise, nutrition and lifestyle modifications          ITP Comments:  ITP Comments     Row Name 05/04/24 1146 05/07/24 1712 05/18/24 0940 06/12/24 1708 07/17/24 1437   ITP Comments Dr.  Wilbert Bihari medical director. Introduction to pritikin education/intensive cardiac rehab. Initial orientation packet reviewed with patient. 30 Day ITP Review. Timothy Grant started cardiac rehab on 05/07/24. Timothy Grant did well with exercise. 30 Day ITP Review. Timothy Grant started cardiac rehab on 05/07/24. Timothy Grant is off to a good start with exercise. 30 Day ITP Review. Timothy Grant has good attendance and participation with exercise at  cardiac rehab 30 Day ITP Review. Timothy Grant has good attendance and participation with exercise at  cardiac rehab since his return after having COVID 19.      Comments: See ITP Comments

## 2024-07-18 ENCOUNTER — Encounter (HOSPITAL_COMMUNITY)
Admission: RE | Admit: 2024-07-18 | Discharge: 2024-07-18 | Disposition: A | Discharge status: Home / Self Care | Source: Ambulatory Visit | Attending: Cardiology | Admitting: Cardiology

## 2024-07-18 DIAGNOSIS — I214 Non-ST elevation (NSTEMI) myocardial infarction: Secondary | ICD-10-CM

## 2024-07-19 ENCOUNTER — Other Ambulatory Visit (HOSPITAL_COMMUNITY): Payer: Self-pay

## 2024-07-19 ENCOUNTER — Inpatient Hospital Stay: Admitting: Hematology & Oncology

## 2024-07-19 ENCOUNTER — Inpatient Hospital Stay: Attending: Family

## 2024-07-19 VITALS — BP 111/67 | HR 63 | Temp 97.6°F | Resp 19 | Ht 72.0 in | Wt 219.8 lb

## 2024-07-19 DIAGNOSIS — D72819 Decreased white blood cell count, unspecified: Secondary | ICD-10-CM

## 2024-07-19 DIAGNOSIS — D696 Thrombocytopenia, unspecified: Secondary | ICD-10-CM

## 2024-07-19 DIAGNOSIS — D709 Neutropenia, unspecified: Secondary | ICD-10-CM | POA: Insufficient documentation

## 2024-07-19 LAB — CMP (CANCER CENTER ONLY)
ALT: 35 U/L (ref 0–44)
AST: 42 U/L — ABNORMAL HIGH (ref 15–41)
Albumin: 4.3 g/dL (ref 3.5–5.0)
Alkaline Phosphatase: 96 U/L (ref 38–126)
Anion gap: 9 (ref 5–15)
BUN: 14 mg/dL (ref 8–23)
CO2: 26 mmol/L (ref 22–32)
Calcium: 9.6 mg/dL (ref 8.9–10.3)
Chloride: 98 mmol/L (ref 98–111)
Creatinine: 0.99 mg/dL (ref 0.61–1.24)
GFR, Estimated: >60 mL/min (ref >60)
Glucose, Bld: 90 mg/dL (ref 70–99)
Potassium: 4.4 mmol/L (ref 3.5–5.1)
Sodium: 133 mmol/L — ABNORMAL LOW (ref 135–145)
Total Bilirubin: 0.4 mg/dL (ref 0.0–1.2)
Total Protein: 6.9 g/dL (ref 6.5–8.1)

## 2024-07-19 LAB — CBC WITH DIFFERENTIAL (CANCER CENTER ONLY)
Abs Immature Granulocytes: 0.01 K/uL (ref 0.00–0.07)
Basophils Absolute: 0 K/uL (ref 0.0–0.1)
Basophils Relative: 0 %
Eosinophils Absolute: 0.1 K/uL (ref 0.0–0.5)
Eosinophils Relative: 2 %
HCT: 34.6 % — ABNORMAL LOW (ref 39.0–52.0)
Hemoglobin: 11.8 g/dL — ABNORMAL LOW (ref 13.0–17.0)
Immature Granulocytes: 0 %
Lymphocytes Relative: 22 %
Lymphs Abs: 0.9 K/uL (ref 0.7–4.0)
MCH: 32.9 pg (ref 26.0–34.0)
MCHC: 34.1 g/dL (ref 30.0–36.0)
MCV: 96.4 fL (ref 80.0–100.0)
Monocytes Absolute: 0.5 K/uL (ref 0.1–1.0)
Monocytes Relative: 13 %
Neutro Abs: 2.4 K/uL (ref 1.7–7.7)
Neutrophils Relative %: 63 %
Platelet Count: 126 K/uL — ABNORMAL LOW (ref 150–400)
RBC: 3.59 MIL/uL — ABNORMAL LOW (ref 4.22–5.81)
RDW: 13.2 % (ref 11.5–15.5)
WBC Count: 3.9 K/uL — ABNORMAL LOW (ref 4.0–10.5)
nRBC: 0 % (ref 0.0–0.2)

## 2024-07-19 LAB — LACTATE DEHYDROGENASE: LDH: 177 U/L (ref 98–192)

## 2024-07-19 NOTE — Progress Notes (Signed)
 Hematology and Oncology Follow Up Visit  Timothy Grant [id number] 30-Apr-1946 78 y.o. 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  He is doing pretty well.  He and his wife had a wonderful time in [place].  They went on a river cruise.  They were gone for about 8 days.  He really enjoyed himself.  Otherwise, he is doing well.  His wife has pulmonary fibrosis.  He is busy trying to help take care of her.  He has had no problems with bleeding.  He has had no bruising.  His appetite has been good.  He has had no nausea or vomiting.  He has had no cough or shortness of breath.  He has had no change in bowel or bladder habits.  He has had no melena or bright red blood per rectum.  He is doing okay with his heart.  He apparently he had a blockage back in April.  This cannot be stented.  He is on medication for this.  Overall, I will have to say that his performance status is probably ECOG 0.    Medications:  Allergies as of 07/19/2024   No Known Allergies      Medication List        Accurate as of July 19, 2024  4:21 PM. If you have any questions, ask your nurse or doctor.          acetaminophen  500 MG tablet Commonly known as: TYLENOL  Take 1,000 mg by mouth daily as needed for headache.   amLODipine  2.5 MG tablet Commonly known as: NORVASC  TAKE 1 TABLET BY MOUTH DAILY   aspirin  EC 81 MG tablet Take 1 tablet (81 mg total) by mouth daily. Swallow whole.   Cetirizine HCl 10 MG Caps Take 10 mg by mouth daily.   clopidogrel  75 MG tablet Commonly known as: PLAVIX  Take 1 tablet (75 mg total) by mouth daily.   cyanocobalamin  1000 MCG tablet Commonly known as: VITAMIN B12 Take 1,000 mcg by mouth daily.   cyclobenzaprine  10 MG tablet Commonly known as: FLEXERIL  Take 1 tablet (10 mg total) by mouth 3 (three) times daily as  needed for muscle spasms. What changed: when to take this   ezetimibe  10 MG tablet Commonly known as: ZETIA  TAKE 1 TABLET BY MOUTH DAILY   Fish Oil 1200 MG Caps Take 1,200 mg by mouth daily.   folic acid  1 MG tablet Commonly known as: FOLVITE  TAKE 1 TABLET BY MOUTH DAILY   Glucosamine HCl 1000 MG Tabs Take 1 tablet by mouth daily.   isosorbide  mononitrate 30 MG 24 hr tablet Commonly known as: IMDUR  TAKE 1 TABLET BY MOUTH DAILY   levothyroxine  137 MCG tablet Commonly known as: SYNTHROID  Take 1 tablet by mouth daily on Monday, Wednesday, Friday.   levothyroxine  150 MCG tablet Commonly known as: SYNTHROID  TAKE 1 TABLET BY MOUTH ON TUESDAY, THURSDAY, SATURDAY,AND SUNDAY   multivitamin capsule Take 1 capsule by mouth daily. Centrum Senior   nitroGLYCERIN  0.4 MG SL tablet Commonly known as: NITROSTAT  Place 1 tablet (0.4 mg total) under the tongue every 5 (five) minutes as needed for chest pain (up to 3 doses).   Repatha  SureClick 140 MG/ML Soaj Generic drug: Evolocumab  Inject 140 mg into the skin every 14 (fourteen) days.   rosuvastatin  10 MG tablet Commonly known as: CRESTOR  TAKE 1 TABLET BY  MOUTH DAILY What changed: when to take this   Vitamin D3 125 MCG (5000 UT) Caps Take 5,000 Units by mouth daily.        Allergies: No Known Allergies  Past Medical History, Surgical history, Social history, and Family History were reviewed and updated.  Review of Systems:  Review of Systems  Constitutional: Negative.   HENT: Negative.    Eyes: Negative.   Respiratory: Negative.    Cardiovascular: Negative.   Gastrointestinal: Negative.   Genitourinary: Negative.   Musculoskeletal: Negative.   Skin: Negative.   Neurological: Negative.   Endo/Heme/Allergies: Negative.   Psychiatric/Behavioral: Negative.       Physical Exam:  height is 6' (1.829 m) and weight is 219 lb 12.8 oz (99.7 kg). His oral temperature is 97.6 F (36.4 C). His blood pressure is 111/67 and  his pulse is 63. His respiration is 19 and oxygen saturation is 99%.    Wt Readings from Last 3 Encounters:  07/19/24 219 lb 12.8 oz (99.7 kg)  07/12/24 218 lb (98.9 kg)  05/21/24 221 lb 8 oz (100.5 kg)    Physical Exam Vitals reviewed.  HENT:     Head: Normocephalic and atraumatic.  Eyes:     Pupils: Pupils are equal, round, and reactive to light.  Cardiovascular:     Rate and Rhythm: Normal rate and regular rhythm.     Heart sounds: Normal heart sounds.  Pulmonary:     Effort: Pulmonary effort is normal.     Breath sounds: Normal breath sounds.  Abdominal:     General: Bowel sounds are normal.     Palpations: Abdomen is soft.  Musculoskeletal:        General: No tenderness or deformity. Normal range of motion.     Cervical back: Normal range of motion.  Lymphadenopathy:     Cervical: No cervical adenopathy.  Skin:    General: Skin is warm and dry.     Findings: No erythema or rash.  Neurological:     Mental Status: He is alert and oriented to person, place, and time.  Psychiatric:        Behavior: Behavior normal.        Thought Content: Thought content normal.        Judgment: Judgment normal.     Lab Results  Component Value Date   WBC 3.9 (L) 07/19/2024   HGB 11.8 (L) 07/19/2024   HCT 34.6 (L) 07/19/2024   MCV 96.4 07/19/2024   PLT 126 (L) 07/19/2024   Lab Results  Component Value Date   FERRITIN 52 03/16/2024   IRON 96 03/16/2024   TIBC 374 03/16/2024   UIBC 278 03/16/2024   IRONPCTSAT 26 03/16/2024   Lab Results  Component Value Date   RBC 3.59 (L) 07/19/2024   No results found for: KPAFRELGTCHN, LAMBDASER, KAPLAMBRATIO Lab Results  Component Value Date   IGGSERUM 981 10/04/2023   No results found for: STEPHANY RINGS, A1GS, A2GS, EARLA JOANNIE KNIGHTS, MSPIKE, SPEI   Chemistry      Component Value Date/Time   NA 133 (L) 07/19/2024 1517   K 4.4 07/19/2024 1517   CL 98 07/19/2024 1517   CO2 26 07/19/2024  1517   BUN 14 07/19/2024 1517   CREATININE 0.99 07/19/2024 1517   CREATININE 1.02 11/17/2020 0848      Component Value Date/Time   CALCIUM  9.6 07/19/2024 1517   ALKPHOS 96 07/19/2024 1517   AST 42 (H) 07/19/2024 1517   ALT 35 07/19/2024  1517   BILITOT 0.4 07/19/2024 1517      Impression and Plan: Timothy Grant is a very nice 78 yo caucasian gentleman with very mild thrombocytopenia and neutropenia.   I took a look at his blood smear.  Again, I do not see anything that looked suspicious.  Everything is holding steady right now.  He might be a little bit more anemic but again I am not too worried about this.  I really think that we can get him back after the Holiday season.  I think this would be very reasonable.  He knows that he can always call if he has any problems before then. SABRA Maude JONELLE Timmy, MD 8/14/20254:21 PM

## 2024-07-20 ENCOUNTER — Encounter (HOSPITAL_COMMUNITY)
Admission: RE | Admit: 2024-07-20 | Discharge: 2024-07-20 | Disposition: A | Discharge status: Home / Self Care | Source: Ambulatory Visit | Attending: Cardiology | Admitting: Cardiology

## 2024-07-20 DIAGNOSIS — I214 Non-ST elevation (NSTEMI) myocardial infarction: Secondary | ICD-10-CM

## 2024-07-20 DIAGNOSIS — E782 Mixed hyperlipidemia: Secondary | ICD-10-CM | POA: Diagnosis not present

## 2024-07-20 DIAGNOSIS — I25118 Atherosclerotic heart disease of native coronary artery with other forms of angina pectoris: Secondary | ICD-10-CM | POA: Diagnosis not present

## 2024-07-21 LAB — LIPID PANEL
Chol/HDL Ratio: 1.6 ratio (ref 0.0–5.0)
Cholesterol, Total: 90 mg/dL — ABNORMAL LOW (ref 100–199)
HDL: 58 mg/dL (ref >39)
LDL Chol Calc (NIH): 20 mg/dL (ref 0–99)
Triglycerides: 43 mg/dL (ref 0–149)
VLDL Cholesterol Cal: 12 mg/dL (ref 5–40)

## 2024-07-23 ENCOUNTER — Encounter (HOSPITAL_COMMUNITY)
Admission: RE | Admit: 2024-07-23 | Discharge: 2024-07-23 | Disposition: A | Discharge status: Home / Self Care | Source: Ambulatory Visit | Attending: Cardiology

## 2024-07-23 DIAGNOSIS — I214 Non-ST elevation (NSTEMI) myocardial infarction: Secondary | ICD-10-CM

## 2024-07-25 ENCOUNTER — Ambulatory Visit: Payer: Self-pay | Admitting: Cardiology

## 2024-07-25 ENCOUNTER — Encounter (HOSPITAL_COMMUNITY)
Admission: RE | Admit: 2024-07-25 | Discharge: 2024-07-25 | Disposition: A | Discharge status: Home / Self Care | Source: Ambulatory Visit | Attending: Cardiology | Admitting: Cardiology

## 2024-07-25 DIAGNOSIS — I214 Non-ST elevation (NSTEMI) myocardial infarction: Secondary | ICD-10-CM

## 2024-07-27 ENCOUNTER — Encounter (HOSPITAL_COMMUNITY)
Admission: RE | Admit: 2024-07-27 | Discharge: 2024-07-27 | Disposition: A | Discharge status: Home / Self Care | Source: Ambulatory Visit | Attending: Cardiology | Admitting: Cardiology

## 2024-07-27 DIAGNOSIS — I214 Non-ST elevation (NSTEMI) myocardial infarction: Secondary | ICD-10-CM

## 2024-07-30 ENCOUNTER — Encounter (HOSPITAL_COMMUNITY)
Admission: RE | Admit: 2024-07-30 | Discharge: 2024-07-30 | Disposition: A | Discharge status: Home / Self Care | Source: Ambulatory Visit | Attending: Cardiology

## 2024-07-30 DIAGNOSIS — I214 Non-ST elevation (NSTEMI) myocardial infarction: Secondary | ICD-10-CM | POA: Diagnosis not present

## 2024-08-01 ENCOUNTER — Encounter (HOSPITAL_COMMUNITY)
Admission: RE | Admit: 2024-08-01 | Discharge: 2024-08-01 | Disposition: A | Discharge status: Home / Self Care | Source: Ambulatory Visit | Attending: Cardiology

## 2024-08-01 DIAGNOSIS — I214 Non-ST elevation (NSTEMI) myocardial infarction: Secondary | ICD-10-CM | POA: Diagnosis not present

## 2024-08-03 ENCOUNTER — Encounter (HOSPITAL_COMMUNITY)
Admission: RE | Admit: 2024-08-03 | Discharge: 2024-08-03 | Disposition: A | Discharge status: Home / Self Care | Source: Ambulatory Visit | Attending: Cardiology | Admitting: Cardiology

## 2024-08-03 DIAGNOSIS — I214 Non-ST elevation (NSTEMI) myocardial infarction: Secondary | ICD-10-CM

## 2024-08-08 ENCOUNTER — Encounter (HOSPITAL_COMMUNITY)
Admission: RE | Admit: 2024-08-08 | Discharge: 2024-08-08 | Disposition: A | Discharge status: Home / Self Care | Source: Ambulatory Visit | Attending: Cardiology | Admitting: Cardiology

## 2024-08-08 DIAGNOSIS — I252 Old myocardial infarction: Secondary | ICD-10-CM | POA: Insufficient documentation

## 2024-08-08 DIAGNOSIS — Z5189 Encounter for other specified aftercare: Secondary | ICD-10-CM | POA: Insufficient documentation

## 2024-08-08 DIAGNOSIS — I214 Non-ST elevation (NSTEMI) myocardial infarction: Secondary | ICD-10-CM

## 2024-08-10 ENCOUNTER — Encounter (HOSPITAL_COMMUNITY)
Admission: RE | Admit: 2024-08-10 | Discharge: 2024-08-10 | Disposition: A | Discharge status: Home / Self Care | Source: Ambulatory Visit | Attending: Cardiology | Admitting: Cardiology

## 2024-08-10 DIAGNOSIS — I252 Old myocardial infarction: Secondary | ICD-10-CM | POA: Diagnosis not present

## 2024-08-10 DIAGNOSIS — Z5189 Encounter for other specified aftercare: Secondary | ICD-10-CM | POA: Diagnosis not present

## 2024-08-10 DIAGNOSIS — I214 Non-ST elevation (NSTEMI) myocardial infarction: Secondary | ICD-10-CM

## 2024-08-13 ENCOUNTER — Encounter (HOSPITAL_COMMUNITY)
Admission: RE | Admit: 2024-08-13 | Discharge: 2024-08-13 | Disposition: A | Discharge status: Home / Self Care | Source: Ambulatory Visit | Attending: Cardiology | Admitting: Cardiology

## 2024-08-13 DIAGNOSIS — Z5189 Encounter for other specified aftercare: Secondary | ICD-10-CM | POA: Diagnosis not present

## 2024-08-13 DIAGNOSIS — I252 Old myocardial infarction: Secondary | ICD-10-CM | POA: Diagnosis not present

## 2024-08-13 DIAGNOSIS — I214 Non-ST elevation (NSTEMI) myocardial infarction: Secondary | ICD-10-CM

## 2024-08-13 NOTE — Progress Notes (Signed)
 Cardiac Individual Treatment Plan  Patient Details  Name: Timothy Grant MRN: [id number] Date of Birth: Oct 18, 1946 Referring Provider:   Flowsheet Row INTENSIVE CARDIAC REHAB ORIENT from 05/04/2024 in [facility]  Referring Provider Peter Swazil[place], MD    Initial Encounter Date:  Flowsheet Row INTENSIVE CARDIAC REHAB ORIENT from 05/04/2024 in [facility]  Date 05/04/24    Visit Diagnosis: 03/09/24 NSTEMI (non-ST elevated myocardial infarction) [facility])  Patient's Home Medications on Admission:  Current Outpatient Medications:    acetaminophen  (TYLENOL ) 500 MG tablet, Take 1,000 mg by mouth daily as needed for headache., Disp: , Rfl:    amLODipine  (NORVASC ) 2.5 MG tablet, TAKE 1 TABLET BY MOUTH DAILY, Disp: 90 tablet, Rfl: 1   aspirin  EC 81 MG tablet, Take 1 tablet (81 mg total) by mouth daily. Swallow whole., Disp: 30 tablet, Rfl: 11   Cetirizine HCl 10 MG CAPS, Take 10 mg by mouth daily., Disp: , Rfl:    Cholecalciferol (VITAMIN D3) 125 MCG (5000 UT) CAPS, Take 5,000 Units by mouth daily., Disp: , Rfl:    clopidogrel  (PLAVIX ) 75 MG tablet, Take 1 tablet (75 mg total) by mouth daily., Disp: 30 tablet, Rfl: 11   cyanocobalamin  (VITAMIN B12) 1000 MCG tablet, Take 1,000 mcg by mouth daily., Disp: , Rfl:    cyclobenzaprine  (FLEXERIL ) 10 MG tablet, Take 1 tablet (10 mg total) by mouth 3 (three) times daily as needed for muscle spasms. (Patient taking differently: Take 10 mg by mouth at bedtime as needed for muscle spasms.), Disp: 90 tablet, Rfl: 3   Evolocumab  (REPATHA  SURECLICK) 140 MG/ML SOAJ, Inject 140 mg into the skin every 14 (fourteen) days., Disp: 2 mL, Rfl: 11   ezetimibe  (ZETIA ) 10 MG tablet, TAKE 1 TABLET BY MOUTH DAILY, Disp: 90 tablet, Rfl: 1   folic acid  (FOLVITE ) 1 MG tablet, TAKE 1 TABLET BY MOUTH DAILY, Disp: 90 tablet, Rfl: 1   Glucosamine HCl 1000 MG TABS, Take 1  tablet by mouth daily., Disp: , Rfl:    isosorbide  mononitrate (IMDUR ) 30 MG 24 hr tablet, TAKE 1 TABLET BY MOUTH DAILY, Disp: 90 tablet, Rfl: 1   levothyroxine  (SYNTHROID ) 137 MCG tablet, Take 1 tablet by mouth daily on Monday, Wednesday, Friday., Disp: 90 tablet, Rfl: 3   levothyroxine  (SYNTHROID ) 150 MCG tablet, TAKE 1 TABLET BY MOUTH ON TUESDAY, THURSDAY, SATURDAY,AND SUNDAY, Disp: 90 tablet, Rfl: 3   Multiple Vitamin (MULTIVITAMIN) capsule, Take 1 capsule by mouth daily. Centrum Senior, Disp: , Rfl:    nitroGLYCERIN  (NITROSTAT ) 0.4 MG SL tablet, Place 1 tablet (0.4 mg total) under the tongue every 5 (five) minutes as needed for chest pain (up to 3 doses)., Disp: 25 tablet, Rfl: 3   Omega-3 Fatty Acids (FISH OIL) 1200 MG CAPS, Take 1,200 mg by mouth daily., Disp: , Rfl:    rosuvastatin  (CRESTOR ) 10 MG tablet, TAKE 1 TABLET BY MOUTH DAILY (Patient taking differently: Take 10 mg by mouth at bedtime.), Disp: 90 tablet, Rfl: 3  Past Medical History: Past Medical History:  Diagnosis Date   Adenomatous polyps    Allergy    seasonal   Arthritis    Cardiomegaly    Cataract    Cirrhosis (HCC)    ED (erectile dysfunction)    Gallbladder polyp    Heart attack (HCC)    Hepatitis A    Hyperlipidemia    Hypothyroidism    Non-traumatic rhabdomyolysis 02/21/2019  Thrombocytopenia (HCC)    Varicocele 02/01/2011   Left testicle    Tobacco Use: Social History   Tobacco Use  Smoking Status Former   Types: Cigarettes, Cigars  Smokeless Tobacco Never  Tobacco Comments   quit 1975    Labs: Review Flowsheet  More data exists      Latest Ref Rng & Units 05/18/2021 02/02/2024 03/25/2024 05/09/2024 07/20/2024  Labs for ITP Cardiac and Pulmonary Rehab  Cholestrol 100 - 199 mg/dL 838  855  855  875  90   LDL (calc) 0 - 99 mg/dL 83  82  85  63  20   HDL-C >39 mg/dL 38.49  52.99  52  48  58   Trlycerides 0 - 149 mg/dL 16.9  22.9  37  60  43     Capillary Blood Glucose: No results found for:  GLUCAP   Exercise Target Goals: Exercise Program Goal: Individual exercise prescription set using results from initial 6 min walk test and THRR while considering  patient's activity barriers and safety.   Exercise Prescription Goal: Initial exercise prescription builds to 30-45 minutes a day of aerobic activity, 2-3 days per week.  Home exercise guidelines will be given to patient during program as part of exercise prescription that the participant will acknowledge.  Activity Barriers & Risk Stratification:  Activity Barriers & Cardiac Risk Stratification - 05/04/24 1123       Activity Barriers & Cardiac Risk Stratification   Activity Barriers Arthritis;History of Falls;Joint Problems    Cardiac Risk Stratification High   <5 METs on         6 Minute Walk:  6 Minute Walk     Row Name 05/04/24 1210         6 Minute Walk   Phase Initial     Distance 1500 feet     Walk Time 6 minutes     # of Rest Breaks 0     MPH 2.84     METS 2.59     RPE 9     Perceived Dyspnea  0     VO2 Peak 9.05     Symptoms Yes (comment)     Comments pt stated he felt swimmy when turning to sit down at completion of , BP 122/62. Resolved immediately with sitting. After sitting for a few minutes asked pt to stand, lightheaded/swimmy did not return. no other s/sx.     Resting HR 73 bpm     Resting BP 102/60     Resting Oxygen Saturation  97 %     Exercise Oxygen Saturation  during 6 min walk 97 %     Max Ex. HR 101 bpm     Max Ex. BP 122/62     2 Minute Post BP 114/60        Oxygen Initial Assessment:   Oxygen Re-Evaluation:   Oxygen Discharge (Final Oxygen Re-Evaluation):   Initial Exercise Prescription:  Initial Exercise Prescription - 05/04/24 1100       Date of Initial Exercise RX and Referring Provider   Date 05/04/24    Referring Provider Peter Sw[place], MD    Expected Discharge Date 07/25/24      Bike   Level 1    Watts 40    Minutes 15    METs 2       NuStep   Level 2    SPM 70    Minutes 15    METs 2.2  Prescription Details   Frequency (times per week) 3    Duration Progress to 30 minutes of continuous aerobic without signs/symptoms of physical distress      Intensity   THRR 40-80% of Max Heartrate 57-114    Ratings of Perceived Exertion 11-13    Perceived Dyspnea 0-4      Progression   Progression Continue progressive overload as per policy without signs/symptoms or physical distress.      Resistance Training   Training Prescription Yes    Weight 3    Reps 10-15          Perform Capillary Blood Glucose checks as needed.  Exercise Prescription Changes:   Exercise Prescription Changes     Row Name 05/07/24 1039 05/14/24 1038 06/01/24 1035 06/18/24 1034 07/06/24 1031     Response to Exercise   Blood Pressure (Admit) 102/60 110/62 98/60 100/60 112/78   Blood Pressure (Exercise) 152/76 158/78 144/80 150/62 --   Blood Pressure (Exit) 100/62 104/72 92/64 110/70 102/64   Heart Rate (Admit) 78 bpm 69 bpm 63 bpm 67 bpm 60 bpm   Heart Rate (Exercise) 120 bpm 120 bpm 122 bpm 121 bpm 113 bpm   Heart Rate (Exit) 86 bpm 78 bpm 72 bpm 79 bpm 68 bpm   Symptoms None None None None None   Comments Off to a good start with exercise. -- Reviewd exercise prescription and goals with Timothy Grant. Increased workload on bike. -- Reviewed goals with Timothy Grant.   Duration Continue with 30 min of aerobic exercise without signs/symptoms of physical distress. Continue with 30 min of aerobic exercise without signs/symptoms of physical distress. Continue with 30 min of aerobic exercise without signs/symptoms of physical distress. Continue with 30 min of aerobic exercise without signs/symptoms of physical distress. Continue with 30 min of aerobic exercise without signs/symptoms of physical distress.   Intensity THRR unchanged THRR unchanged THRR unchanged THRR unchanged THRR unchanged     Progression   Progression Continue to progress workloads to  maintain intensity without signs/symptoms of physical distress. Continue to progress workloads to maintain intensity without signs/symptoms of physical distress. Continue to progress workloads to maintain intensity without signs/symptoms of physical distress. Continue to progress workloads to maintain intensity without signs/symptoms of physical distress. Continue to progress workloads to maintain intensity without signs/symptoms of physical distress.   Average METs 2.7 2.9 3.8 3.8 3.6     Resistance Training   Training Prescription Yes Yes Yes Yes Yes   Weight 3 lbs 3 lbs 5 lbs 5 lbs 5 lbs   Reps 10-15 10-15 10-15 10-15 10-15   Time 5 Minutes 5 Minutes 5 Minutes 5 Minutes 5 Minutes     Interval Training   Interval Training No No No No No     Bike   Level 1 1 2 2 2    Watts 21 22 48 41 47   Minutes 15 15 15 15 15    METs 2.6 2.6 3.5 3.3 3.4     NuStep   Level 1 1 2 2 2    SPM 100 118 119 135 124   Minutes 15 15 15 15 15    METs 2.8 3.3 4.2 4.2 3.9     Home Exercise Plan   Plans to continue exercise at -- -- Home (comment) Home (comment) Home (comment)   Frequency -- -- Add 4 additional days to program exercise sessions. Add 4 additional days to program exercise sessions. Add 4 additional days to program exercise sessions.   Initial Home  Exercises Provided -- -- 06/01/24 06/01/24 06/01/24    Row Name 07/09/24 1038 07/23/24 1036 08/08/24 1035         Response to Exercise   Blood Pressure (Admit) 108/60 98/68 100/58     Blood Pressure (Exit) 110/60 110/62 92/66     Heart Rate (Admit) 63 bpm 68 bpm 65 bpm     Heart Rate (Exercise) 120 bpm 119 bpm 115 bpm     Heart Rate (Exit) 72 bpm 79 bpm 71 bpm     Symptoms None None None     Duration Continue with 30 min of aerobic exercise without signs/symptoms of physical distress. Continue with 30 min of aerobic exercise without signs/symptoms of physical distress. Continue with 30 min of aerobic exercise without signs/symptoms of physical  distress.     Intensity THRR unchanged THRR unchanged THRR unchanged       Progression   Progression Continue to progress workloads to maintain intensity without signs/symptoms of physical distress. Continue to progress workloads to maintain intensity without signs/symptoms of physical distress. Continue to progress workloads to maintain intensity without signs/symptoms of physical distress.     Average METs 3.8 4.3 4.3       Resistance Training   Training Prescription Yes Yes No     Weight 5 lbs 5 lbs Relaxation day, no weights.     Reps 10-15 10-15 --     Time 5 Minutes 5 Minutes --       Interval Training   Interval Training No No No       Bike   Level 3 4 4      Watts 59 78 86     Minutes 15 15 15      METs 3.8 4.4 4.5       NuStep   Level 3 3 4      SPM 125 121 122     Minutes 15 15 15      METs 3.8 4.2 4.2       Home Exercise Plan   Plans to continue exercise at Home (comment) Home (comment) Home (comment)     Frequency Add 4 additional days to program exercise sessions. Add 4 additional days to program exercise sessions. Add 4 additional days to program exercise sessions.     Initial Home Exercises Provided 06/01/24 06/01/24 06/01/24        Exercise Comments:   Exercise Comments     Row Name 05/07/24 1139 05/18/24 1101 06/01/24 1104 06/25/24 1049 07/06/24 1113   Exercise Comments Timothy Grant tolerated low intensity exercise well without symptoms. Oriented him to the stretching and exercise routine. Stretching handout given. Reviewed METs and goals with Timothy Grant. Reviewed home exercise guidelines and goals with Timothy Grant. Patient out due to COVID. Reviewed goals with Timothy Grant.    Row Name 07/11/24 1113 08/10/24 1116         Exercise Comments Reviewed METs with Timothy Grant. Reviewed goals/ discharge exercise action plan with Timothy Grant.         Exercise Goals and Review:   Exercise Goals     Row Name 05/04/24 1124             Exercise Goals   Increase Physical Activity Yes        Intervention Provide advice, education, support and counseling about physical activity/exercise needs.;Develop an individualized exercise prescription for aerobic and resistive training based on initial evaluation findings, risk stratification, comorbidities and participant's personal goals.       Expected Outcomes Short Term: Attend rehab  on a regular basis to increase amount of physical activity.;Long Term: Exercising regularly at least 3-5 days a week.;Long Term: Add in home exercise to make exercise part of routine and to increase amount of physical activity.       Increase Strength and Stamina Yes       Intervention Provide advice, education, support and counseling about physical activity/exercise needs.;Develop an individualized exercise prescription for aerobic and resistive training based on initial evaluation findings, risk stratification, comorbidities and participant's personal goals.       Expected Outcomes Short Term: Increase workloads from initial exercise prescription for resistance, speed, and METs.;Short Term: Perform resistance training exercises routinely during rehab and add in resistance training at home;Long Term: Improve cardiorespiratory fitness, muscular endurance and strength as measured by increased METs and functional capacity ( )       Able to understand and use rate of perceived exertion (RPE) scale Yes       Intervention Provide education and explanation on how to use RPE scale       Expected Outcomes Short Term: Able to use RPE daily in rehab to express subjective intensity level;Long Term:  Able to use RPE to guide intensity level when exercising independently       Knowledge and understanding of Target Heart Rate Range (THRR) Yes       Intervention Provide education and explanation of THRR including how the numbers were predicted and where they are located for reference       Expected Outcomes Short Term: Able to state/look up THRR;Short Term: Able to use daily as  guideline for intensity in rehab;Long Term: Able to use THRR to govern intensity when exercising independently       Understanding of Exercise Prescription Yes       Intervention Provide education, explanation, and written materials on patient's individual exercise prescription       Expected Outcomes Short Term: Able to explain program exercise prescription;Long Term: Able to explain home exercise prescription to exercise independently          Exercise Goals Re-Evaluation :  Exercise Goals Re-Evaluation     Row Name 05/07/24 1139 05/18/24 1101 06/01/24 1104 06/25/24 1049 07/06/24 1113     Exercise Goal Re-Evaluation   Exercise Goals Review Increase Physical Activity;Increase Strength and Stamina;Able to understand and use rate of perceived exertion (RPE) scale Increase Physical Activity;Increase Strength and Stamina;Able to understand and use rate of perceived exertion (RPE) scale;Knowledge and understanding of Target Heart Rate Range (THRR) Increase Physical Activity;Increase Strength and Stamina;Able to understand and use rate of perceived exertion (RPE) scale;Knowledge and understanding of Target Heart Rate Range (THRR);Understanding of Exercise Prescription Increase Physical Activity;Increase Strength and Stamina;Able to understand and use rate of perceived exertion (RPE) scale;Knowledge and understanding of Target Heart Rate Range (THRR);Understanding of Exercise Prescription Increase Physical Activity;Increase Strength and Stamina;Able to understand and use rate of perceived exertion (RPE) scale;Knowledge and understanding of Target Heart Rate Range (THRR);Understanding of Exercise Prescription   Comments Timothy Grant was able to understand and use RPE scale appropriately. Timothy Grant is walking 25 minutes twice/ day as his mode of home exercise. He is golfing and gardening without issue. Reviewed THRR with him. Reviewed exercise prescription with Timothy Grant. He's walking 20 minutes twice daily as his mode of home  exercise. Timothy Grant reported today that his wife has COVID and he's symptomatic. No exercise today. Will review METs once cleared to return to exercise. Timothy Grant returned to exercise today after being out with COVID19. He is  going a little slower today, and his goal is to get back to where he was with exercise prior to being out sick.   Expected Outcomes Progress workloads as tolerated to help increase cardiorespiratory fitness. Continue daily walking in addition to exercise at cardiac rehab. Continue daily walking to help achieve personal health and fitness goals. Review goals upon return to exercise. Progress workloads to rebuild strength and stamina.    Row Name 08/10/24 1116             Exercise Goal Re-Evaluation   Exercise Goals Review Increase Physical Activity;Increase Strength and Stamina;Able to understand and use rate of perceived exertion (RPE) scale;Knowledge and understanding of Target Heart Rate Range (THRR);Understanding of Exercise Prescription       Comments Timothy Grant will complete cardiac rehab in the next couple of weeks. He walks 15 minutes 2-3 times/day, walking his dog about 0.5 miles. He has exercise bands that he can use for his resistance exercise. We discussed him incorporating resistance exercise 1-3 sets, 10-15 repetitions, 2-3 non-consecutive days/week.       Expected Outcomes Timothy Grant will continue daily walking 30-45 minutes, and resistance exercise 2-3 non-consecutive days.          Discharge Exercise Prescription (Final Exercise Prescription Changes):  Exercise Prescription Changes - 08/08/24 1035       Response to Exercise   Blood Pressure (Admit) 100/58    Blood Pressure (Exit) 92/66    Heart Rate (Admit) 65 bpm    Heart Rate (Exercise) 115 bpm    Heart Rate (Exit) 71 bpm    Symptoms None    Duration Continue with 30 min of aerobic exercise without signs/symptoms of physical distress.    Intensity THRR unchanged      Progression   Progression Continue to progress  workloads to maintain intensity without signs/symptoms of physical distress.    Average METs 4.3      Resistance Training   Training Prescription No    Weight Relaxation day, no weights.      Interval Training   Interval Training No      Bike   Level 4    Watts 86    Minutes 15    METs 4.5      NuStep   Level 4    SPM 122    Minutes 15    METs 4.2      Home Exercise Plan   Plans to continue exercise at Home (comment)    Frequency Add 4 additional days to program exercise sessions.    Initial Home Exercises Provided 06/01/24          Nutrition:  Target Goals: Understanding of nutrition guidelines, daily intake of sodium 1500mg , cholesterol 200mg , calories 30% from fat and 7% or less from saturated fats, daily to have 5 or more servings of fruits and vegetables.  Biometrics:  Pre Biometrics - 05/04/24 1121       Pre Biometrics   Waist Circumference 43.5 inches    Hip Circumference 43 inches    Waist to Hip Ratio 1.01 %    Triceps Skinfold 26 mm    % Body Fat 32.7 %    Grip Strength 34 kg    Flexibility 13.5 in    Single Leg Stand 2 seconds           Nutrition Therapy Plan and Nutrition Goals:  Nutrition Therapy & Goals - 07/11/24 1049       Nutrition Therapy   Diet heart healthy  diet    Drug/Food Interactions Statins/Certain Fruits      Personal Nutrition Goals   Nutrition Goal Patient to identify strategies for reducing cardiovascular risk by attending the [employer] education and nutrition series weekly.   goal in action.   Personal Goal #2 Patient to improve diet quality by using the plate method as a guide for meal planning to include lean protein/plant protein, fruits, vegetables, whole grains, nonfat dairy as part of a well-balanced diet.   goal in action.   Comments Goals in action. Timothy Grant has medical history of coronary artery disease, NSTEMI, hyperlipidemia, pancytopenia, thoracic aortic aneurysm, compensated cirrhosis secondary to prior acute  hepatitis A.He continues to attend the [employer] education and nutrition series regularly. LDL is not at goal; per cardiology documentation at 04/11/24 follow-up, Rhabdomyolysis on higher dose Crestor . Currently on Crestor  10 mg and Zetia  10 mg. Goal to reduce LDL to 55 or less due to multivessel coronary artery disease. Repatha /PCSK9 inhibitors started on 05/30/24. He continues regular follow-up with GI related to history of hepatitis A and cirrhosis. He is down 9# since starting with our program. Patient will benefit from participation in intensive cardiac rehab for nutrition education, exercise, and lifestyle modification.      Intervention Plan   Intervention Prescribe, educate and counsel regarding individualized specific dietary modifications aiming towards targeted core components such as weight, hypertension, lipid management, diabetes, heart failure and other comorbidities.;Nutrition handout(s) given to patient.    Expected Outcomes Short Term Goal: Understand basic principles of dietary content, such as calories, fat, sodium, cholesterol and nutrients.;Long Term Goal: Adherence to prescribed nutrition plan.          Nutrition Assessments:  Nutrition Assessments - 08/13/24 1744       Rate Your Plate Scores   Pre Score 56    Post Score 67         MEDIFICTS Score Key: >=70 Need to make dietary changes  40-70 Heart Healthy Diet <= 40 Therapeutic Level Cholesterol Diet   Flowsheet Row INTENSIVE CARDIAC REHAB from 08/13/2024 in [facility]  Picture Your Plate Total Score on Admission 56  Picture Your Plate Total Score on Discharge 67   Picture Your Plate Scores: <59 Unhealthy dietary pattern with much room for improvement. 41-50 Dietary pattern unlikely to meet recommendations for good health and room for improvement. 51-60 More healthful dietary pattern, with some room for improvement.  >60 Healthy dietary pattern, although  there may be some specific behaviors that could be improved.    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 Currently on Crestor  10 mg and Zetia  10 mg. Goal to reduce LDL to 55 or less due to multivessel coronary artery disease. Discussed potential use of PCSK9 inhibitors if cholesterol  goals are not met with current regimen. He continues regular follow-up with GI related to history of hepatitis A and cirrhosis. Patient will benefit from participation in intensive cardiac rehab for nutrition education, exercise, and lifestyle modification. Goals in action. 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 LDL is not at goal; per cardiology documentation at 04/11/24 follow-up, Rhabdomyolysis on higher dose Crestor . Currently on Crestor  10 mg and Zetia  10 mg. Goal to reduce LDL to 55 or less due to multivessel coronary artery disease. Repatha /PCSK9 inhibitors started on 05/30/24. He continues regular follow-up with GI related to history of hepatitis A and cirrhosis. He is down 8.8# since starting with our program. Patient will benefit from participation in intensive cardiac  rehab for nutrition education, exercise, and lifestyle modification. Goals in action. 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 LDL is not at goal; per cardiology documentation at 04/11/24 follow-up, Rhabdomyolysis on higher dose Crestor . Currently on Crestor  10 mg and Zetia  10 mg. Goal to reduce LDL to 55 or less due to multivessel coronary artery disease. Repatha /PCSK9 inhibitors started on 05/30/24. He continues regular follow-up with GI related to history of hepatitis A and cirrhosis. He is down 9# since starting with our program. Patient will benefit from participation in intensive cardiac rehab for nutrition education, exercise, and lifestyle modification.        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 Currently on Crestor  10 mg and Zetia  10 mg. Goal to reduce LDL to 55 or less due to multivessel coronary artery disease. Discussed potential use of PCSK9 inhibitors if cholesterol goals are not met with current regimen. He continues regular follow-up with GI related to history of hepatitis A and cirrhosis. Patient will benefit from participation in intensive  cardiac rehab for nutrition education, exercise, and lifestyle modification. Goals in action. 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 LDL is not at goal; per cardiology documentation at 04/11/24 follow-up, Rhabdomyolysis on higher dose Crestor . Currently on Crestor  10 mg and Zetia  10 mg. Goal to reduce LDL to 55 or less due to multivessel coronary artery disease. Repatha /PCSK9 inhibitors started on 05/30/24. He continues regular follow-up with GI related to history of hepatitis A and cirrhosis. He is down 8.8# since starting with our program. Patient will benefit from participation in intensive cardiac rehab for nutrition education, exercise, and lifestyle modification. Goals in action.  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 LDL is not at goal; per cardiology documentation at 04/11/24 follow-up, Rhabdomyolysis on higher dose Crestor . Currently on Crestor  10 mg and Zetia  10 mg. Goal to reduce LDL to 55 or less due to multivessel coronary artery disease. Repatha /PCSK9 inhibitors started on 05/30/24. He continues regular follow-up with GI related to history of hepatitis A and cirrhosis. He is down 9# since starting with our program. Patient will benefit from participation in intensive cardiac rehab for nutrition education, exercise, and lifestyle modification.        Nutrition Goals Discharge (Final Nutrition Goals Re-Evaluation):  Nutrition Goals Re-Evaluation - 07/11/24 1049       Goals   Current Weight 220 lb 14.4 oz (100.2 kg)    Comment LDL 65 (improved but not at goal of <55), Lpa 111.8     Expected Outcome Goals in action. 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 LDL is not at goal; per cardiology documentation at 04/11/24 follow-up, Rhabdomyolysis on higher dose Crestor . Currently on Crestor  10 mg and Zetia  10 mg. Goal to reduce LDL to 55 or less due to multivessel coronary artery disease. Repatha /PCSK9 inhibitors started on 05/30/24. He continues regular follow-up with GI related to history of hepatitis A and cirrhosis. He is down 9# since starting with our program. Patient will benefit from participation in intensive cardiac rehab for nutrition education, exercise, and lifestyle modification.          Psychosocial: Target Goals: Acknowledge presence or absence of significant depression and/or stress, maximize coping skills, provide positive support system. Participant is able to verbalize types and ability to use techniques and skills needed for reducing stress and depression.  Initial Review & Psychosocial Screening:  Initial Psych Review & Screening - 05/04/24 1124       Initial Review   Current issues with None Identified      Family Dynamics   Good Support System? Yes   wife for support     Barriers   Psychosocial barriers to participate in program There are no identifiable barriers or psychosocial needs.      Screening Interventions   Interventions Encouraged to exercise;Provide feedback about the scores to participant    Expected Outcomes Short Term goal: Identification and review with participant of any Quality of Life or Depression concerns found by scoring the questionnaire.;Long Term goal: The participant improves quality of Life and PHQ9 Scores as seen by post scores and/or verbalization of changes          Quality of Life Scores:  Quality of  Life - 08/13/24 1743       Quality of Life   Select Quality of Life      Quality of Life Scores   Health/Function Pre 25.2 %    Health/Function Post 23.37 %    Health/Function % Change -7.26 %    Socioeconomic Pre 25.43 %    Socioeconomic Post 25.36 %    Socioeconomic % Change  -0.28 %    Psych/Spiritual Pre 22.93 %    Psych/Spiritual Post 25.07 %    Psych/Spiritual % Change 9.33 %    Family Pre 24 %    Family Post 22.2 %  Family % Change -7.5 %    GLOBAL Pre 24.6 %    GLOBAL Post 23.96 %    GLOBAL % Change -2.6 %         Scores of 19 and below usually indicate a poorer quality of life in these areas.  A difference of  2-3 points is a clinically meaningful difference.  A difference of 2-3 points in the total score of the Quality of Life Index has been associated with significant improvement in overall quality of life, self-image, physical symptoms, and general health in studies assessing change in quality of life.  PHQ-9: Review Flowsheet  More data exists      08/13/2024 05/21/2024 05/04/2024 02/09/2024 05/16/2023  Depression screen PHQ 2/9  Decreased Interest 1 0 0 0 0  Down, Depressed, Hopeless 0 0 0 0 0  PHQ - 2 Score 1 0 0 0 0  Altered sleeping 0 0 0 - -  Tired, decreased energy 0 0 0 - -  Change in appetite 1 0 1 - -  Feeling bad or failure about yourself  0 0 1 - -  Trouble concentrating 0 0 0 - -  Moving slowly or fidgety/restless 0 0 0 - -  Suicidal thoughts 0 0 0 - -  PHQ-9 Score 2 0 2 - -  Difficult doing work/chores Not difficult at all - - - -   Interpretation of Total Score  Total Score Depression Severity:  1-4 = Minimal depression, 5-9 = Mild depression, 10-14 = Moderate depression, 15-19 = Moderately severe depression, 20-27 = Severe depression   Psychosocial Evaluation and Intervention:   Psychosocial Re-Evaluation:  Psychosocial Re-Evaluation     Row Name 05/07/24 1714 05/18/24 0940 06/12/24 1709 07/17/24 1437 08/13/24 1719     Psychosocial  Re-Evaluation   Current issues with None Identified None Identified None Identified None Identified None Identified   Comments -- Reviewed PHQ9. Timothy Grant said that he had been overeating. Timothy Grant says he is now making better choices. Timothy Grant says he is feeling better since starting cardiac rehab. -- Timothy Grant returned after being absent with COVID 19 infection --   Interventions Encouraged to attend Cardiac Rehabilitation for the exercise Encouraged to attend Cardiac Rehabilitation for the exercise Encouraged to attend Cardiac Rehabilitation for the exercise Encouraged to attend Cardiac Rehabilitation for the exercise Encouraged to attend Cardiac Rehabilitation for the exercise   Continue Psychosocial Services  No Follow up required No Follow up required No Follow up required No Follow up required No Follow up required      Psychosocial Discharge (Final Psychosocial Re-Evaluation):  Psychosocial Re-Evaluation - 08/13/24 1719       Psychosocial Re-Evaluation   Current issues with None Identified    Interventions Encouraged to attend Cardiac Rehabilitation for the exercise    Continue Psychosocial Services  No Follow up required          Vocational Rehabilitation: Provide vocational rehab assistance to qualifying candidates.   Vocational Rehab Evaluation & Intervention:  Vocational Rehab - 05/04/24 1125       Initial Vocational Rehab Evaluation & Intervention   Assessment shows need for Vocational Rehabilitation No   retired         Education: Education Goals: Education classes will be provided on a weekly basis, covering required topics. Participant will state understanding/return demonstration of topics presented.    Education     Row Name 05/07/24 1300     Education   Cardiac Education Topics [facility]   [employer]  Workshops   Biomedical scientist Exercise   Exercise Workshop Exercise Basics: Diplomatic Services operational officer   Instruction Review Code 1-  Verbalizes Understanding   Class Start Time 1157   Class Stop Time 1243   Class Time Calculation (min) 46 min    Row Name 05/09/24 1500     Education   Cardiac Education Topics Pritikin   Customer service manager   Weekly Topic Efficiency Cooking - Meals in a Snap   Instruction Review Code 1- Verbalizes Understanding   Class Start Time 1145   Class Stop Time 1222   Class Time Calculation (min) 37 min    Row Name 05/11/24 0900     Education   Cardiac Education Topics Pritikin   Psychologist, forensic Exercise Education   Exercise Education Move It!   Instruction Review Code 1- Verbalizes Understanding   Class Start Time [phone]   Class Stop Time 0845   Class Time Calculation (min) 35 min    Row Name 05/14/24 1300     Education   Cardiac Education Topics Pritikin   Glass blower/designer Nutrition   Nutrition Workshop Targeting Your Nutrition Priorities   Instruction Review Code 1- Verbalizes Understanding   Class Start Time 1150   Class Stop Time 1230   Class Time Calculation (min) 40 min    Row Name 05/16/24 1300     Education   Cardiac Education Topics Pritikin   Customer service manager   Weekly Topic One-Pot Wonders   Instruction Review Code 1- Verbalizes Understanding   Class Start Time 1145   Class Stop Time 1220   Class Time Calculation (min) 35 min    Row Name 05/18/24 1200     Education   Cardiac Education Topics Pritikin   Psychologist, forensic General Education   General Education Hypertension and Heart Disease   Instruction Review Code 1- Verbalizes Understanding   Class Start Time 1155   Class Stop Time 1232   Class Time Calculation (min) 37 min    Row Name 05/21/24 1400     Education   Cardiac Education Topics  Pritikin   Geographical information systems officer Psychosocial   Psychosocial Workshop Focused Goals, Sustainable Changes   Instruction Review Code 1- Verbalizes Understanding   Class Start Time 1155   Class Stop Time 1230   Class Time Calculation (min) 35 min    Row Name 05/23/24 1300     Education   Cardiac Education Topics Pritikin   Customer service manager   Weekly Topic Comforting Weekend Breakfasts   Instruction Review Code 1- Verbalizes Understanding   Class Start Time 1145   Class Stop Time 1220   Class Time Calculation (min) 35 min    Row Name 05/25/24 1100     Education   Cardiac Education Topics Pritikin   Nurse, children's Exercise Physiologist   Select Nutrition   Nutrition Dining Out - Part 1   Instruction Review Code 1-  Verbalizes Understanding   Class Start Time 1147   Class Stop Time 1226   Class Time Calculation (min) 39 min    Row Name 05/28/24 1100     Education   Cardiac Education Topics Pritikin   Select Core Videos     Core Videos   Educator Exercise Physiologist   Select Exercise Education   Exercise Education Biomechanial Limitations   Instruction Review Code 1- Verbalizes Understanding   Class Start Time 1155   Class Stop Time 1235   Class Time Calculation (min) 40 min    Row Name 05/30/24 1300     Education   Cardiac Education Topics Pritikin   Customer service manager   Weekly Topic Fast Evening Meals   Instruction Review Code 1- Verbalizes Understanding   Class Start Time 1145   Class Stop Time 1225   Class Time Calculation (min) 40 min    Row Name 06/01/24 1100     Education   Cardiac Education Topics Pritikin   Licensed conveyancer Nutrition   Nutrition Vitamins and Minerals   Instruction Review Code 1- Verbalizes Understanding    Class Start Time 1145   Class Stop Time 1225   Class Time Calculation (min) 40 min    Row Name 06/04/24 1600     Education   Cardiac Education Topics Pritikin   Glass blower/designer Nutrition   Nutrition Workshop Fueling a Forensic psychologist   Instruction Review Code 1- Tax inspector   Class Start Time 1145   Class Stop Time 1230   Class Time Calculation (min) 45 min    Row Name 06/06/24 1500     Education   Cardiac Education Topics Pritikin   Customer service manager   Weekly Topic International Cuisine- Spotlight on the [employer] Zones   Instruction Review Code 1- Verbalizes Understanding   Class Start Time 1145   Class Stop Time 1225   Class Time Calculation (min) 40 min    Row Name 06/18/24 1200     Education   Cardiac Education Topics Pritikin   Hospital doctor Education   General Education Hypertension and Heart Disease   Instruction Review Code 1- Verbalizes Understanding   Class Start Time 1150   Class Stop Time 1225   Class Time Calculation (min) 35 min    Row Name 06/20/24 1100     Education   Cardiac Education Topics Pritikin   Customer service manager   Weekly Topic Powerhouse Plant-Based Proteins   Instruction Review Code 1- Verbalizes Understanding   Class Start Time 1145   Class Stop Time 1225   Class Time Calculation (min) 40 min    Row Name 06/25/24 1300     Education   Cardiac Education Topics Pritikin   Geographical information systems officer Psychosocial   Psychosocial Workshop From Head to Heart: The Power of a Healthy Outlook   Instruction Review Code 1- Verbalizes Understanding   Class Start Time 1152   Class Stop Time 1234   Class Time Calculation (min) 42 min  Row Name 07/06/24 1100     Education    Cardiac Education Topics Pritikin   Geographical information systems officer Exercise   Exercise Workshop Location manager and Fall Prevention   Instruction Review Code 1- Verbalizes Understanding   Class Start Time 1150   Class Stop Time 1240   Class Time Calculation (min) 50 min    Row Name 07/09/24 1500     Education   Cardiac Education Topics Pritikin   Glass blower/designer Nutrition   Nutrition Workshop Label Reading   Instruction Review Code 1- Verbalizes Understanding   Class Start Time 1145   Class Stop Time 1230   Class Time Calculation (min) 45 min    Row Name 07/11/24 1000     Education   Cardiac Education Topics Pritikin   Customer service manager   Weekly Topic Fast and Healthy Breakfasts   Instruction Review Code 1- Verbalizes Understanding   Class Start Time 1145   Class Stop Time 1220   Class Time Calculation (min) 35 min    Row Name 07/13/24 1000     Education   Cardiac Education Topics Pritikin   Licensed conveyancer Nutrition   Nutrition Other   Instruction Review Code 1- Verbalizes Understanding   Class Start Time 1145   Class Stop Time 1225   Class Time Calculation (min) 40 min    Row Name 07/16/24 1100     Education   Cardiac Education Topics Pritikin   Hospital doctor Education   General Education Metabolic Syndrome and Belly Fat   Instruction Review Code 1- Verbalizes Understanding   Class Start Time 1200   Class Stop Time 1235   Class Time Calculation (min) 35 min    Row Name 07/18/24 1100     Education   Cardiac Education Topics Pritikin   Customer service manager   Weekly Topic Personalizing Your Pritikin Plate   Instruction Review Code 1- Verbalizes  Understanding   Class Start Time 1145   Class Stop Time 1225   Class Time Calculation (min) 40 min    Row Name 07/20/24 1100     Education   Cardiac Education Topics Pritikin   Licensed conveyancer Nutrition   Nutrition Overview of the Pritikin Eating Plan   Instruction Review Code [place] Understanding    Row Name 07/23/24 1100     Education   Cardiac Education Topics Pritikin   [employer] Workshops     Workshops   Educator Exercise Physiologist   Select Psychosocial   Psychosocial Workshop Recognizing and Reducing Stress   Instruction Review Code 1- Verbalizes Understanding   Class Start Time 1150   Class Stop Time 1232   Class Time Calculation (min) 42 min    Row Name 07/25/24 1100     Education   Cardiac Education Topics Pritikin   Customer service manager   Weekly Topic [employer] Desserts   Instruction Review Code 1- Verbalizes Understanding   Class  Start Time 1145   Class Stop Time 1225   Class Time Calculation (min) 40 min    Row Name 07/27/24 1100     Education   Cardiac Education Topics Pritikin   Select Core Videos     Core Videos   Educator Dietitian   Select Nutrition   Nutrition Calorie Density   Instruction Review Code 1- Verbalizes Understanding   Class Start Time 1145   Class Stop Time 1225   Class Time Calculation (min) 40 min    Row Name 07/30/24 1100     Education   Cardiac Education Topics Pritikin   [employer]     Workshops   Educator Exercise Physiologist   Select Exercise   Exercise Workshop Exercise Basics: Building Your Action Plan   Instruction Review Code 1- Verbalizes Understanding   Class Start Time 1155   Class Stop Time 1248   Class Time Calculation (min) 53 min    Row Name 08/03/24 1000     Education   Cardiac Education Topics Pritikin   Psychologist, forensic  Exercise Education   Exercise Education Move It!   Instruction Review Code 1- Verbalizes Understanding   Class Start Time 1145   Class Stop Time 1224   Class Time Calculation (min) 39 min    Row Name 08/08/24 1100     Education   Cardiac Education Topics Pritikin   Orthoptist   Educator Dietitian   Weekly Topic One-Pot Wonders   Instruction Review Code 1- Verbalizes Understanding   Class Start Time 1146   Class Stop Time 1225   Class Time Calculation (min) 39 min    Row Name 08/10/24 1100     Education   Cardiac Education Topics Pritikin   Writer General Education   General Education Hypertension and Heart Disease   Instruction Review Code 1- Verbalizes Understanding   Class Start Time 1153   Class Stop Time 1230   Class Time Calculation (min) 37 min    Row Name 08/13/24 1000     Education   Cardiac Education Topics Pritikin   Geographical information systems officer Psychosocial   Psychosocial Workshop Focused Goals, Sustainable Changes   Instruction Review Code 1- Verbalizes Understanding   Class Start Time 1155   Class Stop Time 1235   Class Time Calculation (min) 40 min      Core Videos: Exercise    Move It!  Clinical staff conducted group or individual video education with verbal and written material and guidebook.  Patient learns the recommended Pritikin exercise program. Exercise with the goal of living a long, healthy life. Some of the health benefits of exercise include controlled diabetes, healthier blood pressure levels, improved cholesterol levels, improved heart and lung capacity, improved sleep, and better body composition. Everyone should speak with their doctor before starting or changing an exercise routine.  Biomechanical Limitations Clinical staff conducted group or individual video education with verbal and written material  and guidebook.  Patient learns how biomechanical limitations can impact exercise and how we can mitigate and possibly overcome limitations to have an impactful and balanced exercise routine.  Body Composition Clinical staff conducted group or individual video education with verbal and written material and guidebook.  Patient learns that body composition (ratio of muscle mass to fat mass) is a key component to assessing overall fitness, rather than body weight alone. Increased fat mass, especially visceral belly fat, can put us  at increased risk for metabolic syndrome, type 2 diabetes, heart disease, and even death. It is recommended to combine diet and exercise (cardiovascular and resistance training) to improve your body composition. Seek guidance from your physician and exercise physiologist before implementing an exercise routine.  Exercise Action Plan Clinical staff conducted group or individual video education with verbal and written material and guidebook.  Patient learns the recommended strategies to achieve and enjoy long-term exercise adherence, including variety, self-motivation, self-efficacy, and positive decision making. Benefits of exercise include fitness, good health, weight management, more energy, better sleep, less stress, and overall well-being.  Medical   Heart Disease Risk Reduction Clinical staff conducted group or individual video education with verbal and written material and guidebook.  Patient learns our heart is our most vital organ as it circulates oxygen, nutrients, white blood cells, and hormones throughout the entire body, and carries waste away. Data supports a plant-based eating plan like the Pritikin Program for its effectiveness in slowing progression of and reversing heart disease. The video provides a number of recommendations to address heart disease.   Metabolic Syndrome and Belly Fat  Clinical staff conducted group or individual video education with verbal  and written material and guidebook.  Patient learns what metabolic syndrome is, how it leads to heart disease, and how one can reverse it and keep it from coming back. You have metabolic syndrome if you have 3 of the following 5 criteria: abdominal obesity, high blood pressure, high triglycerides, low HDL cholesterol, and high blood sugar.  Hypertension and Heart Disease Clinical staff conducted group or individual video education with verbal and written material and guidebook.  Patient learns that high blood pressure, or hypertension, is very common in the [place] . Hypertension is largely due to excessive salt intake, but other important risk factors include being overweight, physical inactivity, drinking too much alcohol, smoking, and not eating enough potassium from fruits and vegetables. High blood pressure is a leading risk factor for heart attack, stroke, congestive heart failure, dementia, kidney failure, and premature death. Long-term effects of excessive salt intake include stiffening of the arteries and thickening of heart muscle and organ damage. Recommendations include ways to reduce hypertension and the risk of heart disease.  Diseases of Our Time - Focusing on Diabetes Clinical staff conducted group or individual video education with verbal and written material and guidebook.  Patient learns why the best way to stop diseases of our time is prevention, through food and other lifestyle changes. Medicine (such as prescription pills and surgeries) is often only a Band-Aid on the problem, not a long-term solution. Most common diseases of our time include obesity, type 2 diabetes, hypertension, heart disease, and cancer. The Pritikin Program is recommended and has been proven to help reduce, reverse, and/or prevent the damaging effects of metabolic syndrome.  Nutrition   Overview of the Pritikin Eating Plan  Clinical staff conducted group or individual video education with verbal and  written material and guidebook.  Patient learns about the Pritikin Eating Plan for disease risk reduction. The Pritikin Eating Plan emphasizes a wide variety of unrefined, minimally-processed carbohydrates, like fruits, vegetables, whole grains, and legumes. Go, Caution, and Stop food choices are explained. Plant-based and lean animal proteins are emphasized. Rationale provided for low sodium intake for blood pressure control,  low added sugars for blood sugar stabilization, and low added fats and oils for coronary artery disease risk reduction and weight management.  Calorie Density  Clinical staff conducted group or individual video education with verbal and written material and guidebook.  Patient learns about calorie density and how it impacts the Pritikin Eating Plan. Knowing the characteristics of the food you choose will help you decide whether those foods will lead to weight gain or weight loss, and whether you want to consume more or less of them. Weight loss is usually a side effect of the Pritikin Eating Plan because of its focus on low calorie-dense foods.  Label Reading  Clinical staff conducted group or individual video education with verbal and written material and guidebook.  Patient learns about the Pritikin recommended label reading guidelines and corresponding recommendations regarding calorie density, added sugars, sodium content, and whole grains.  Dining Out - Part 1  Clinical staff conducted group or individual video education with verbal and written material and guidebook.  Patient learns that restaurant meals can be sabotaging because they can be so high in calories, fat, sodium, and/or sugar. Patient learns recommended strategies on how to positively address this and avoid unhealthy pitfalls.  Facts on Fats  Clinical staff conducted group or individual video education with verbal and written material and guidebook.  Patient learns that lifestyle modifications can be just as  effective, if not more so, as many medications for lowering your risk of heart disease. A Pritikin lifestyle can help to reduce your risk of inflammation and atherosclerosis (cholesterol build-up, or plaque, in the artery walls). Lifestyle interventions such as dietary choices and physical activity address the cause of atherosclerosis. A review of the types of fats and their impact on blood cholesterol levels, along with dietary recommendations to reduce fat intake is also included.  Nutrition Action Plan  Clinical staff conducted group or individual video education with verbal and written material and guidebook.  Patient learns how to incorporate Pritikin recommendations into their lifestyle. Recommendations include planning and keeping personal health goals in mind as an important part of their success.  Healthy Mind-Set    Healthy Minds, Bodies, Hearts  Clinical staff conducted group or individual video education with verbal and written material and guidebook.  Patient learns how to identify when they are stressed. Video will discuss the impact of that stress, as well as the many benefits of stress management. Patient will also be introduced to stress management techniques. The way we think, act, and feel has an impact on our hearts.  How Our Thoughts Can Heal Our Hearts  Clinical staff conducted group or individual video education with verbal and written material and guidebook.  Patient learns that negative thoughts can cause depression and anxiety. This can result in negative lifestyle behavior and serious health problems. Cognitive behavioral therapy is an effective method to help control our thoughts in order to change and improve our emotional outlook.  Additional Videos:  Exercise    Improving Performance  Clinical staff conducted group or individual video education with verbal and written material and guidebook.  Patient learns to use a non-linear approach by alternating intensity  levels and lengths of time spent exercising to help burn more calories and lose more body fat. Cardiovascular exercise helps improve heart health, metabolism, hormonal balance, blood sugar control, and recovery from fatigue. Resistance training improves strength, endurance, balance, coordination, reaction time, metabolism, and muscle mass. Flexibility exercise improves circulation, posture, and balance. Seek guidance from your physician  and exercise physiologist before implementing an exercise routine and learn your capabilities and proper form for all exercise.  Introduction to Yoga  Clinical staff conducted group or individual video education with verbal and written material and guidebook.  Patient learns about yoga, a discipline of the coming together of mind, breath, and body. The benefits of yoga include improved flexibility, improved range of motion, better posture and core strength, increased lung function, weight loss, and positive self-image. Yoga's heart health benefits include lowered blood pressure, healthier heart rate, decreased cholesterol and triglyceride levels, improved immune function, and reduced stress. Seek guidance from your physician and exercise physiologist before implementing an exercise routine and learn your capabilities and proper form for all exercise.  Medical   Aging: Enhancing Your Quality of Life  Clinical staff conducted group or individual video education with verbal and written material and guidebook.  Patient learns key strategies and recommendations to stay in good physical health and enhance quality of life, such as prevention strategies, having an advocate, securing a Health Care Proxy and Power of Attorney, and keeping a list of medications and system for tracking them. It also discusses how to avoid risk for bone loss.  Biology of Weight Control  Clinical staff conducted group or individual video education with verbal and written material and guidebook.   Patient learns that weight gain occurs because we consume more calories than we burn (eating more, moving less). Even if your body weight is normal, you may have higher ratios of fat compared to muscle mass. Too much body fat puts you at increased risk for cardiovascular disease, heart attack, stroke, type 2 diabetes, and obesity-related cancers. In addition to exercise, following the Pritikin Eating Plan can help reduce your risk.  Decoding Lab Results  Clinical staff conducted group or individual video education with verbal and written material and guidebook.  Patient learns that lab test reflects one measurement whose values change over time and are influenced by many factors, including medication, stress, sleep, exercise, food, hydration, pre-existing medical conditions, and more. It is recommended to use the knowledge from this video to become more involved with your lab results and evaluate your numbers to speak with your doctor.   Diseases of Our Time - Overview  Clinical staff conducted group or individual video education with verbal and written material and guidebook.  Patient learns that according to the CDC, 50% to 70% of chronic diseases (such as obesity, type 2 diabetes, elevated lipids, hypertension, and heart disease) are avoidable through lifestyle improvements including healthier food choices, listening to satiety cues, and increased physical activity.  Sleep Disorders Clinical staff conducted group or individual video education with verbal and written material and guidebook.  Patient learns how good quality and duration of sleep are important to overall health and well-being. Patient also learns about sleep disorders and how they impact health along with recommendations to address them, including discussing with a physician.  Nutrition  Dining Out - Part 2 Clinical staff conducted group or individual video education with verbal and written material and guidebook.  Patient learns  how to plan ahead and communicate in order to maximize their dining experience in a healthy and nutritious manner. Included are recommended food choices based on the type of restaurant the patient is visiting.   Fueling a Banker conducted group or individual video education with verbal and written material and guidebook.  There is a strong connection between our food choices and our health. Diseases like obesity  and type 2 diabetes are very prevalent and are in large-part due to lifestyle choices. The Pritikin Eating Plan provides plenty of food and hunger-curbing satisfaction. It is easy to follow, affordable, and helps reduce health risks.  Menu Workshop  Clinical staff conducted group or individual video education with verbal and written material and guidebook.  Patient learns that restaurant meals can sabotage health goals because they are often packed with calories, fat, sodium, and sugar. Recommendations include strategies to plan ahead and to communicate with the manager, chef, or server to help order a healthier meal.  Planning Your Eating Strategy  Clinical staff conducted group or individual video education with verbal and written material and guidebook.  Patient learns about the Pritikin Eating Plan and its benefit of reducing the risk of disease. The Pritikin Eating Plan does not focus on calories. Instead, it emphasizes high-quality, nutrient-rich foods. By knowing the characteristics of the foods, we choose, we can determine their calorie density and make informed decisions.  Targeting Your Nutrition Priorities  Clinical staff conducted group or individual video education with verbal and written material and guidebook.  Patient learns that lifestyle habits have a tremendous impact on disease risk and progression. This video provides eating and physical activity recommendations based on your personal health goals, such as reducing LDL cholesterol, losing weight,  preventing or controlling type 2 diabetes, and reducing high blood pressure.  Vitamins and Minerals  Clinical staff conducted group or individual video education with verbal and written material and guidebook.  Patient learns different ways to obtain key vitamins and minerals, including through a recommended healthy diet. It is important to discuss all supplements you take with your doctor.   Healthy Mind-Set    Smoking Cessation  Clinical staff conducted group or individual video education with verbal and written material and guidebook.  Patient learns that cigarette smoking and tobacco addiction pose a serious health risk which affects millions of people. Stopping smoking will significantly reduce the risk of heart disease, lung disease, and many forms of cancer. Recommended strategies for quitting are covered, including working with your doctor to develop a successful plan.  Culinary   Becoming a Set designer conducted group or individual video education with verbal and written material and guidebook.  Patient learns that cooking at home can be healthy, cost-effective, quick, and puts them in control. Keys to cooking healthy recipes will include looking at your recipe, assessing your equipment needs, planning ahead, making it simple, choosing cost-effective seasonal ingredients, and limiting the use of added fats, salts, and sugars.  Cooking - Breakfast and Snacks  Clinical staff conducted group or individual video education with verbal and written material and guidebook.  Patient learns how important breakfast is to satiety and nutrition through the entire day. Recommendations include key foods to eat during breakfast to help stabilize blood sugar levels and to prevent overeating at meals later in the day. Planning ahead is also a key component.  Cooking - Educational psychologist conducted group or individual video education with verbal and written material and  guidebook.  Patient learns eating strategies to improve overall health, including an approach to cook more at home. Recommendations include thinking of animal protein as a side on your plate rather than center stage and focusing instead on lower calorie dense options like vegetables, fruits, whole grains, and plant-based proteins, such as beans. Making sauces in large quantities to freeze for later and leaving the skin on your vegetables are  also recommended to maximize your experience.  Cooking - Healthy Salads and Dressing Clinical staff conducted group or individual video education with verbal and written material and guidebook.  Patient learns that vegetables, fruits, whole grains, and legumes are the foundations of the Pritikin Eating Plan. Recommendations include how to incorporate each of these in flavorful and healthy salads, and how to create homemade salad dressings. Proper handling of ingredients is also covered. Cooking - Soups and [employer] - Soups and Desserts Clinical staff conducted group or individual video education with verbal and written material and guidebook.  Patient learns that Pritikin soups and desserts make for easy, nutritious, and delicious snacks and meal components that are low in sodium, fat, sugar, and calorie density, while high in vitamins, minerals, and filling fiber. Recommendations include simple and healthy ideas for soups and desserts.   Overview     The Pritikin Solution Program Overview Clinical staff conducted group or individual video education with verbal and written material and guidebook.  Patient learns that the results of the Pritikin Program have been documented in more than 100 articles published in peer-reviewed journals, and the benefits include reducing risk factors for (and, in some cases, even reversing) high cholesterol, high blood pressure, type 2 diabetes, obesity, and more! An overview of the three key pillars of the Pritikin Program  will be covered: eating well, doing regular exercise, and having a healthy mind-set.  WORKSHOPS  Exercise: Exercise Basics: Building Your Action Plan Clinical staff led group instruction and group discussion with PowerPoint presentation and patient guidebook. To enhance the learning environment the use of posters, models and videos may be added. At the conclusion of this workshop, patients will comprehend the difference between physical activity and exercise, as well as the benefits of incorporating both, into their routine. Patients will understand the FITT (Frequency, Intensity, Time, and Type) principle and how to use it to build an exercise action plan. In addition, safety concerns and other considerations for exercise and cardiac rehab will be addressed by the presenter. The purpose of this lesson is to promote a comprehensive and effective weekly exercise routine in order to improve patients' overall level of fitness.   Managing Heart Disease: Your Path to a Healthier Heart Clinical staff led group instruction and group discussion with PowerPoint presentation and patient guidebook. To enhance the learning environment the use of posters, models and videos may be added.At the conclusion of this workshop, patients will understand the anatomy and physiology of the heart. Additionally, they will understand how Pritikin's three pillars impact the risk factors, the progression, and the management of heart disease.  The purpose of this lesson is to provide a high-level overview of the heart, heart disease, and how the Pritikin lifestyle positively impacts risk factors.  Exercise Biomechanics Clinical staff led group instruction and group discussion with PowerPoint presentation and patient guidebook. To enhance the learning environment the use of posters, models and videos may be added. Patients will learn how the structural parts of their bodies function and how these functions impact their daily  activities, movement, and exercise. Patients will learn how to promote a neutral spine, learn how to manage pain, and identify ways to improve their physical movement in order to promote healthy living. The purpose of this lesson is to expose patients to common physical limitations that impact physical activity. Participants will learn practical ways to adapt and manage aches and pains, and to minimize their effect on regular exercise. Patients will learn how to  maintain good posture while sitting, walking, and lifting.  Balance Training and Fall Prevention  Clinical staff led group instruction and group discussion with PowerPoint presentation and patient guidebook. To enhance the learning environment the use of posters, models and videos may be added. At the conclusion of this workshop, patients will understand the importance of their sensorimotor skills (vision, proprioception, and the vestibular system) in maintaining their ability to balance as they age. Patients will apply a variety of balancing exercises that are appropriate for their current level of function. Patients will understand the common causes for poor balance, possible solutions to these problems, and ways to modify their physical environment in order to minimize their fall risk. The purpose of this lesson is to teach patients about the importance of maintaining balance as they age and ways to minimize their risk of falling.  WORKSHOPS   Nutrition:  Fueling a Ship broker led group instruction and group discussion with PowerPoint presentation and patient guidebook. To enhance the learning environment the use of posters, models and videos may be added. Patients will review the foundational principles of the Pritikin Eating Plan and understand what constitutes a serving size in each of the food groups. Patients will also learn Pritikin-friendly foods that are better choices when away from home and review make-ahead  meal and snack options. Calorie density will be reviewed and applied to three nutrition priorities: weight maintenance, weight loss, and weight gain. The purpose of this lesson is to reinforce (in a group setting) the key concepts around what patients are recommended to eat and how to apply these guidelines when away from home by planning and selecting Pritikin-friendly options. Patients will understand how calorie density may be adjusted for different weight management goals.  Mindful Eating  Clinical staff led group instruction and group discussion with PowerPoint presentation and patient guidebook. To enhance the learning environment the use of posters, models and videos may be added. Patients will briefly review the concepts of the Pritikin Eating Plan and the importance of low-calorie dense foods. The concept of mindful eating will be introduced as well as the importance of paying attention to internal hunger signals. Triggers for non-hunger eating and techniques for dealing with triggers will be explored. The purpose of this lesson is to provide patients with the opportunity to review the basic principles of the Pritikin Eating Plan, discuss the value of eating mindfully and how to measure internal cues of hunger and fullness using the Hunger Scale. Patients will also discuss reasons for non-hunger eating and learn strategies to use for controlling emotional eating.  Targeting Your Nutrition Priorities Clinical staff led group instruction and group discussion with PowerPoint presentation and patient guidebook. To enhance the learning environment the use of posters, models and videos may be added. Patients will learn how to determine their genetic susceptibility to disease by reviewing their family history. Patients will gain insight into the importance of diet as part of an overall healthy lifestyle in mitigating the impact of genetics and other environmental insults. The purpose of this lesson is to  provide patients with the opportunity to assess their personal nutrition priorities by looking at their family history, their own health history and current risk factors. Patients will also be able to discuss ways of prioritizing and modifying the Pritikin Eating Plan for their highest risk areas  Menu  Clinical staff led group instruction and group discussion with PowerPoint presentation and patient guidebook. To enhance the learning environment the use of posters, models  and videos may be added. Using menus brought in from [employer], or printed from [employer], patients will apply the Pritikin dining out guidelines that were presented in the [employer] video. Patients will also be able to practice these guidelines in a variety of provided scenarios. The purpose of this lesson is to provide patients with the opportunity to practice hands-on learning of the Pritikin Dining Out guidelines with actual menus and practice scenarios.  Label Reading Clinical staff led group instruction and group discussion with PowerPoint presentation and patient guidebook. To enhance the learning environment the use of posters, models and videos may be added. Patients will review and discuss the Pritikin label reading guidelines presented in Pritikin's Label Reading Educational series video. Using fool labels brought in from local grocery stores and markets, patients will apply the label reading guidelines and determine if the packaged food meet the Pritikin guidelines. The purpose of this lesson is to provide patients with the opportunity to review, discuss, and practice hands-on learning of the Pritikin Label Reading guidelines with actual packaged food labels. Cooking School  Pritikin's [employer] are designed to teach patients ways to prepare quick, simple, and affordable recipes at home. The importance of nutrition's role in chronic disease risk reduction is reflected in its  emphasis in the overall Pritikin program. By learning how to prepare essential core Pritikin Eating Plan recipes, patients will increase control over what they eat; be able to customize the flavor of foods without the use of added salt, sugar, or fat; and improve the quality of the food they consume. By learning a set of core recipes which are easily assembled, quickly prepared, and affordable, patients are more likely to prepare more healthy foods at home. These workshops focus on convenient breakfasts, simple entres, side dishes, and desserts which can be prepared with minimal effort and are consistent with nutrition recommendations for cardiovascular risk reduction. Cooking [employer] are taught by a Armed forces logistics/support/administrative officer (RD) who has been trained by the [employer]. The chef or RD has a clear understanding of the importance of minimizing - if not completely eliminating - added fat, sugar, and sodium in recipes. Throughout the series of Cooking School Workshop sessions, patients will learn about healthy ingredients and efficient methods of cooking to build confidence in their capability to prepare    Cooking School weekly topics:  Adding Flavor- Sodium-Free  Fast and Healthy Breakfasts  Powerhouse Plant-Based Proteins  Satisfying Salads and Dressings  Simple Sides and Sauces  International Cuisine-Spotlight on the [employer] Zones  Delicious Desserts  Savory Soups  [employer] - Meals in a Astronomer Appetizers and Snacks  Comforting Weekend Breakfasts  One-Pot Wonders   Fast Evening Meals  Landscape architect Your Pritikin Plate  WORKSHOPS   Healthy Mindset (Psychosocial):  Focused Goals, Sustainable Changes Clinical staff led group instruction and group discussion with PowerPoint presentation and patient guidebook. To enhance the learning environment the use of posters, models and videos may be added. Patients will be able to apply effective  goal setting strategies to establish at least one personal goal, and then take consistent, meaningful action toward that goal. They will learn to identify common barriers to achieving personal goals and develop strategies to overcome them. Patients will also gain an understanding of how our mind-set can impact our ability to achieve goals and the importance of cultivating a positive and growth-oriented mind-set. The purpose of this lesson is to provide  patients with a deeper understanding of how to set and achieve personal goals, as well as the tools and strategies needed to overcome common obstacles which may arise along the way.  From Head to Heart: The Power of a Healthy Outlook  Clinical staff led group instruction and group discussion with PowerPoint presentation and patient guidebook. To enhance the learning environment the use of posters, models and videos may be added. Patients will be able to recognize and describe the impact of emotions and mood on physical health. They will discover the importance of self-care and explore self-care practices which may work for them. Patients will also learn how to utilize the 4 C's to cultivate a healthier outlook and better manage stress and challenges. The purpose of this lesson is to demonstrate to patients how a healthy outlook is an essential part of maintaining good health, especially as they continue their cardiac rehab journey.  Healthy Sleep for a Healthy Heart Clinical staff led group instruction and group discussion with PowerPoint presentation and patient guidebook. To enhance the learning environment the use of posters, models and videos may be added. At the conclusion of this workshop, patients will be able to demonstrate knowledge of the importance of sleep to overall health, well-being, and quality of life. They will understand the symptoms of, and treatments for, common sleep disorders. Patients will also be able to identify daytime and nighttime  behaviors which impact sleep, and they will be able to apply these tools to help manage sleep-related challenges. The purpose of this lesson is to provide patients with a general overview of sleep and outline the importance of quality sleep. Patients will learn about a few of the most common sleep disorders. Patients will also be introduced to the concept of "sleep hygiene," and discover ways to self-manage certain sleeping problems through simple daily behavior changes. Finally, the workshop will motivate patients by clarifying the links between quality sleep and their goals of heart-healthy living.   Recognizing and Reducing Stress Clinical staff led group instruction and group discussion with PowerPoint presentation and patient guidebook. To enhance the learning environment the use of posters, models and videos may be added. At the conclusion of this workshop, patients will be able to understand the types of stress reactions, differentiate between acute and chronic stress, and recognize the impact that chronic stress has on their health. They will also be able to apply different coping mechanisms, such as reframing negative self-talk. Patients will have the opportunity to practice a variety of stress management techniques, such as deep abdominal breathing, progressive muscle relaxation, and/or guided imagery.  The purpose of this lesson is to educate patients on the role of stress in their lives and to provide healthy techniques for coping with it.  Learning Barriers/Preferences:  Learning Barriers/Preferences - 05/04/24 1124       Learning Barriers/Preferences   Learning Barriers Sight   glasses   Learning Preferences Audio;Computer/Internet;Group Instruction;Skilled Demonstration;Verbal Instruction;Video;Written Material;Individual Instruction;Pictoral          Education Topics:  Knowledge Questionnaire Score:  Knowledge Questionnaire Score - 08/13/24 1738       Knowledge Questionnaire  Score   Pre Score 23/24    Post Score 24/24          Core Components/Risk Factors/Patient Goals at Admission:  Personal Goals and Risk Factors at Admission - 05/04/24 1125       Core Components/Risk Factors/Patient Goals on Admission    Weight Management Yes;Obesity    Intervention Weight Management: Develop  a combined nutrition and exercise program designed to reach desired caloric intake, while maintaining appropriate intake of nutrient and fiber, sodium and fats, and appropriate energy expenditure required for the weight goal.;Weight Management: Provide education and appropriate resources to help participant work on and attain dietary goals.;Weight Management/Obesity: Establish reasonable short term and long term weight goals.;Obesity: Provide education and appropriate resources to help participant work on and attain dietary goals.    Expected Outcomes Short Term: Continue to assess and modify interventions until short term weight is achieved;Long Term: Adherence to nutrition and physical activity/exercise program aimed toward attainment of established weight goal;Understanding recommendations for meals to include 15-35% energy as protein, 25-35% energy from fat, 35-60% energy from carbohydrates, less than 200mg  of dietary cholesterol, 20-35 gm of total fiber daily;Understanding of distribution of calorie intake throughout the day with the consumption of 4-5 meals/snacks    Tobacco Cessation Yes    Number of packs per day 1-2 cigars a month    Intervention Assist the participant in steps to quit. Provide individualized education and counseling about committing to Tobacco Cessation, relapse prevention, and pharmacological support that can be provided by physician.;Education officer, environmental, assist with locating and accessing local/national Quit Smoking programs, and support quit date choice.    Expected Outcomes Short Term: Will demonstrate readiness to quit, by selecting a quit date.;Long  Term: Complete abstinence from all tobacco products for at least 12 months from quit date.;Short Term: Will quit all tobacco product use, adhering to prevention of relapse plan.    Hypertension Yes    Intervention Provide education on lifestyle modifcations including regular physical activity/exercise, weight management, moderate sodium restriction and increased consumption of fresh fruit, vegetables, and low fat dairy, alcohol moderation, and smoking cessation.;Monitor prescription use compliance.    Expected Outcomes Short Term: Continued assessment and intervention until BP is < 140/32mm HG in hypertensive participants. < 130/71mm HG in hypertensive participants with diabetes, heart failure or chronic kidney disease.;Long Term: Maintenance of blood pressure at goal levels.    Lipids Yes    Intervention Provide education and support for participant on nutrition & aerobic/resistive exercise along with prescribed medications to achieve LDL 70mg , HDL >40mg .    Expected Outcomes Long Term: Cholesterol controlled with medications as prescribed, with individualized exercise RX and with personalized nutrition plan. Value goals: LDL < 70mg , HDL > 40 mg.;Short Term: Participant states understanding of desired cholesterol values and is compliant with medications prescribed. Participant is following exercise prescription and nutrition guidelines.          Core Components/Risk Factors/Patient Goals Review:   Goals and Risk Factor Review     Row Name 05/07/24 1714 05/18/24 0943 06/12/24 1712 07/17/24 1440 08/13/24 1724     Core Components/Risk Factors/Patient Goals Review   Personal Goals Review Weight Management/Obesity;Hypertension;Lipids Weight Management/Obesity;Hypertension;Lipids Weight Management/Obesity;Hypertension;Lipids Weight Management/Obesity;Hypertension;Lipids Weight Management/Obesity;Hypertension;Lipids   Review Timothy Grant started cardiac rehab on 05/07/24. Timothy Grant did well with exercise. Vital  signs were stable. Timothy Grant started cardiac rehab on 05/07/24. Timothy Grant is off to a good start with exercise. Vital signs have been stable. Timothy Grant has been increasing his mets. Timothy Grant is doing well  with exercise. Vital signs have been stable. Timothy Grant has been increasing his mets levels. Timothy Grant has lost 4 kg since starting cardiac rehab. Timothy Grant continues to  do well  with exercise. Vital signs have been stable. Timothy Grant continues to  increase  his mets levels. Timothy Grant has lost 4.5 kg since starting cardiac rehab. Timothy Grant continues to  do well  with exercise. Vital signs have been stable. Timothy Grant continues to  increase  his mets levels. Timothy Grant has lost 5.8 kg since starting cardiac rehab. Timothy Grant will complete cardiac rehab on 08/20/24   Expected Outcomes Timothy Grant will continue to participate in cardiac rehab for exercise, nutrition and lifestyle modifications Timothy Grant will continue to participate in cardiac rehab for exercise, nutrition and lifestyle modifications Timothy Grant will continue to participate in cardiac rehab for exercise, nutrition and lifestyle modifications Timothy Grant will continue to participate in cardiac rehab for exercise, nutrition and lifestyle modifications Timothy Grant will continue to participate in cardiac rehab for exercise, nutrition and lifestyle modifications      Core Components/Risk Factors/Patient Goals at Discharge (Final Review):   Goals and Risk Factor Review - 08/13/24 1724       Core Components/Risk Factors/Patient Goals Review   Personal Goals Review Weight Management/Obesity;Hypertension;Lipids    Review Timothy Grant continues to  do well  with exercise. Vital signs have been stable. Timothy Grant continues to  increase  his mets levels. Timothy Grant has lost 5.8 kg since starting cardiac rehab. Timothy Grant will complete cardiac rehab on 08/20/24    Expected Outcomes Timothy Grant will continue to participate in cardiac rehab for exercise, nutrition and lifestyle modifications          ITP Comments:  ITP Comments     Row Name 05/04/24 1146 05/07/24 1712 05/18/24  0940 06/12/24 1708 07/17/24 1437   ITP Comments Dr. Wilbert Bihari medical director. Introduction to [facility]ik[facility] education/intensive cardiac rehab. Initial orientation packet reviewed with patient. 30 Day ITP Review. Timothy Grant started cardiac rehab on 05/07/24. Timothy Grant did well with exercise. 30 Day ITP Review. Timothy Grant started cardiac rehab on 05/07/24. Timothy Grant is off to a good start with exercise. 30 Day ITP Review. Timothy Grant has good attendance and participation with exercise at  cardiac rehab 30 Day ITP Review. Timothy Grant has good attendance and participation with exercise at  cardiac rehab since his return after having COVID 19.    Row Name 08/13/24 1717           ITP Comments 30 Day ITP Review. Timothy Grant continues to have good attendance and participation with exercise at cardiac rehab. Timothy Grant will tenatively complete cardiac rehab on 08/20/24          Comments: See ITP Comments

## 2024-08-15 ENCOUNTER — Encounter (HOSPITAL_COMMUNITY)
Admission: RE | Admit: 2024-08-15 | Discharge: 2024-08-15 | Disposition: A | Discharge status: Home / Self Care | Source: Ambulatory Visit | Attending: Cardiology | Admitting: Cardiology

## 2024-08-15 DIAGNOSIS — I214 Non-ST elevation (NSTEMI) myocardial infarction: Secondary | ICD-10-CM

## 2024-08-15 DIAGNOSIS — I252 Old myocardial infarction: Secondary | ICD-10-CM | POA: Diagnosis not present

## 2024-08-15 DIAGNOSIS — Z5189 Encounter for other specified aftercare: Secondary | ICD-10-CM | POA: Diagnosis not present

## 2024-08-17 ENCOUNTER — Telehealth: Payer: Self-pay

## 2024-08-17 ENCOUNTER — Encounter (HOSPITAL_COMMUNITY)
Admission: RE | Admit: 2024-08-17 | Discharge: 2024-08-17 | Disposition: A | Discharge status: Home / Self Care | Source: Ambulatory Visit | Attending: Cardiology

## 2024-08-17 DIAGNOSIS — I214 Non-ST elevation (NSTEMI) myocardial infarction: Secondary | ICD-10-CM

## 2024-08-17 DIAGNOSIS — I252 Old myocardial infarction: Secondary | ICD-10-CM | POA: Diagnosis not present

## 2024-08-17 DIAGNOSIS — Z5189 Encounter for other specified aftercare: Secondary | ICD-10-CM | POA: Diagnosis not present

## 2024-08-17 NOTE — Telephone Encounter (Signed)
 Copied from CRM #[id number]. Topic: Clinical - Medication Question >> Aug 17, 2024 12:36 PM [name] wrote: Reason for CRM: Patient is needing a Covid prescription sent over to the [facility].

## 2024-08-20 ENCOUNTER — Encounter (HOSPITAL_COMMUNITY)
Admission: RE | Admit: 2024-08-20 | Discharge: 2024-08-20 | Disposition: A | Discharge status: Home / Self Care | Source: Ambulatory Visit | Attending: Cardiology | Admitting: Cardiology

## 2024-08-20 VITALS — BP 116/72 | HR 79 | Ht 72.0 in | Wt 216.3 lb

## 2024-08-20 DIAGNOSIS — Z5189 Encounter for other specified aftercare: Secondary | ICD-10-CM | POA: Diagnosis not present

## 2024-08-20 DIAGNOSIS — I252 Old myocardial infarction: Secondary | ICD-10-CM | POA: Diagnosis not present

## 2024-08-20 DIAGNOSIS — I214 Non-ST elevation (NSTEMI) myocardial infarction: Secondary | ICD-10-CM

## 2024-08-20 NOTE — Progress Notes (Signed)
 Discharge Progress Report  Patient Details  Name: Timothy Grant MRN: [id number] Date of Birth: 20-Nov-1946 Referring Provider:   Flowsheet Row INTENSIVE CARDIAC REHAB ORIENT from 05/04/2024 in [facility]  Referring Provider Peter [place], MD     Number of Visits: 83  Reason for Discharge:  Patient reached a stable level of exercise. Patient independent in their exercise. Patient has met program and personal goals.  Smoking History:  Social History   Tobacco Use  Smoking Status Former   Types: Cigarettes, Cigars  Smokeless Tobacco Never  Tobacco Comments   quit 1975    Diagnosis:  03/09/24 NSTEMI (non-ST elevated myocardial infarction) [facility])  ADL [facility]SD:   Initial Exercise Prescription:  Initial Exercise Prescription - 05/04/24 1100       Date of Initial Exercise RX and Referring Provider   Date 05/04/24    Referring Provider Peter [place], MD    Expected Discharge Date 07/25/24      Bike   Level 1    Watts 40    Minutes 15    METs 2      NuStep   Level 2    SPM 70    Minutes 15    METs 2.2      Prescription Details   Frequency (times per week) 3    Duration Progress to 30 minutes of continuous aerobic without signs/symptoms of physical distress      Intensity   THRR 40-80% of Max Heartrate 57-114    Ratings of Perceived Exertion 11-13    Perceived Dyspnea 0-4      Progression   Progression Continue progressive overload as per policy without signs/symptoms or physical distress.      Resistance Training   Training Prescription Yes    Weight 3    Reps 10-15          Discharge Exercise Prescription (Final Exercise Prescription Changes):  Exercise Prescription Changes - 08/20/24 1030       Response to Exercise   Blood Pressure (Admit) 116/72    Blood Pressure (Exit) 110/60    Heart Rate (Admit) 79 bpm    Heart Rate (Exercise) 115 bpm    Heart Rate (Exit) 85 bpm    Symptoms None     Comments Chyrl completed the cardiac rehab program today.    Duration Continue with 30 min of aerobic exercise without signs/symptoms of physical distress.    Intensity THRR unchanged      Progression   Progression Continue to progress workloads to maintain intensity without signs/symptoms of physical distress.    Average METs 4.5      Resistance Training   Training Prescription Yes    Weight 5 lbs    Reps 10-15    Time 5 Minutes      Interval Training   Interval Training No      Bike   Level 4    Watts 75    Minutes 15    METs 4.4      NuStep   Level 4    SPM 129    Minutes 15    METs 4.6      Home Exercise Plan   Plans to continue exercise at Home (comment)    Frequency Add 4 additional days to program exercise sessions.    Initial Home Exercises Provided 06/01/24          Functional Capacity:  6 Minute Walk  Row Name 05/04/24 1210 08/15/24 1041       6 Minute Walk   Phase Initial Discharge    Distance 1500 feet 1966 feet    Distance % Change -- 31.07 %    Distance Feet Change -- 466 ft    Walk Time 6 minutes 6 minutes    # of Rest Breaks 0 0    MPH 2.84 3.72    METS 2.59 3.46    RPE 9 9.5    Perceived Dyspnea  0 0    VO2 Peak 9.05 12.12    Symptoms Yes (comment) No    Comments pt stated he felt swimmy when turning to sit down at completion of , BP 122/62. Resolved immediately with sitting. After sitting for a few minutes asked pt to stand, lightheaded/swimmy did not return. no other s/sx. --    Resting HR 73 bpm 65 bpm    Resting BP 102/60 104/62    Resting Oxygen Saturation  97 % --    Exercise Oxygen Saturation  during 6 min walk 97 % --    Max Ex. HR 101 bpm 103 bpm    Max Ex. BP 122/62 118/62    2 Minute Post BP 114/60 106/62       Psychological, QOL, Others - Outcomes: PHQ 2/9:    08/13/2024    5:38 PM 05/21/2024    1:10 PM 05/04/2024   11:47 AM 02/09/2024    9:58 AM 05/16/2023    1:31 PM  Depression screen PHQ 2/9  Decreased  Interest 1 0 0 0 0  Down, Depressed, Hopeless 0 0 0 0 0  PHQ - 2 Score 1 0 0 0 0  Altered sleeping 0 0 0    Tired, decreased energy 0 0 0    Change in appetite 1 0 1    Feeling bad or failure about yourself  0 0 1    Trouble concentrating 0 0 0    Moving slowly or fidgety/restless 0 0 0    Suicidal thoughts 0 0 0    PHQ-9 Score 2 0 2    Difficult doing work/chores Not difficult at all        Quality of Life:  Quality of Life - 08/13/24 1743       Quality of Life   Select Quality of Life      Quality of Life Scores   Health/Function Pre 25.2 %    Health/Function Post 23.37 %    Health/Function % Change -7.26 %    Socioeconomic Pre 25.43 %    Socioeconomic Post 25.36 %    Socioeconomic % Change  -0.28 %    Psych/Spiritual Pre 22.93 %    Psych/Spiritual Post 25.07 %    Psych/Spiritual % Change 9.33 %    Family Pre 24 %    Family Post 22.2 %    Family % Change -7.5 %    GLOBAL Pre 24.6 %    GLOBAL Post 23.96 %    GLOBAL % Change -2.6 %          Personal Goals: Goals established at orientation with interventions provided to work toward goal.  Personal Goals and Risk Factors at Admission - 05/04/24 1125       Core Components/Risk Factors/Patient Goals on Admission    Weight Management Yes;Obesity    Intervention Weight Management: Develop a combined nutrition and exercise program designed to reach desired caloric intake, while maintaining appropriate intake of nutrient and  fiber, sodium and fats, and appropriate energy expenditure required for the weight goal.;Weight Management: Provide education and appropriate resources to help participant work on and attain dietary goals.;Weight Management/Obesity: Establish reasonable short term and long term weight goals.;Obesity: Provide education and appropriate resources to help participant work on and attain dietary goals.    Expected Outcomes Short Term: Continue to assess and modify interventions until short term weight is  achieved;Long Term: Adherence to nutrition and physical activity/exercise program aimed toward attainment of established weight goal;Understanding recommendations for meals to include 15-35% energy as protein, 25-35% energy from fat, 35-60% energy from carbohydrates, less than 200mg  of dietary cholesterol, 20-35 gm of total fiber daily;Understanding of distribution of calorie intake throughout the day with the consumption of 4-5 meals/snacks    Tobacco Cessation Yes    Number of packs per day 1-2 cigars a month    Intervention Assist the participant in steps to quit. Provide individualized education and counseling about committing to Tobacco Cessation, relapse prevention, and pharmacological support that can be provided by physician.;Education officer, environmental, assist with locating and accessing local/national Quit Smoking programs, and support quit date choice.    Expected Outcomes Short Term: Will demonstrate readiness to quit, by selecting a quit date.;Long Term: Complete abstinence from all tobacco products for at least 12 months from quit date.;Short Term: Will quit all tobacco product use, adhering to prevention of relapse plan.    Hypertension Yes    Intervention Provide education on lifestyle modifcations including regular physical activity/exercise, weight management, moderate sodium restriction and increased consumption of fresh fruit, vegetables, and low fat dairy, alcohol moderation, and smoking cessation.;Monitor prescription use compliance.    Expected Outcomes Short Term: Continued assessment and intervention until BP is < 140/45mm HG in hypertensive participants. < 130/27mm HG in hypertensive participants with diabetes, heart failure or chronic kidney disease.;Long Term: Maintenance of blood pressure at goal levels.    Lipids Yes    Intervention Provide education and support for participant on nutrition & aerobic/resistive exercise along with prescribed medications to achieve LDL 70mg ,  HDL >40mg .    Expected Outcomes Long Term: Cholesterol controlled with medications as prescribed, with individualized exercise RX and with personalized nutrition plan. Value goals: LDL < 70mg , HDL > 40 mg.;Short Term: Participant states understanding of desired cholesterol values and is compliant with medications prescribed. Participant is following exercise prescription and nutrition guidelines.           Personal Goals Discharge:  Goals and Risk Factor Review     Row Name 05/07/24 1714 05/18/24 0943 06/12/24 1712 07/17/24 1440 08/13/24 1724     Core Components/Risk Factors/Patient Goals Review   Personal Goals Review Weight Management/Obesity;Hypertension;Lipids Weight Management/Obesity;Hypertension;Lipids Weight Management/Obesity;Hypertension;Lipids Weight Management/Obesity;Hypertension;Lipids Weight Management/Obesity;Hypertension;Lipids   Review Chyrl started cardiac rehab on 05/07/24. Chyrl did well with exercise. Vital signs were stable. Chyrl started cardiac rehab on 05/07/24. Chyrl is off to a good start with exercise. Vital signs have been stable. Chyrl has been increasing his mets. Chyrl is doing well  with exercise. Vital signs have been stable. Chyrl has been increasing his mets levels. Chyrl has lost 4 kg since starting cardiac rehab. Chyrl continues to  do well  with exercise. Vital signs have been stable. Chyrl continues to  increase  his mets levels. Chyrl has lost 4.5 kg since starting cardiac rehab. Chyrl continues to  do well  with exercise. Vital signs have been stable. Chyrl continues to  increase  his mets levels. Chyrl has lost 5.8 kg  since starting cardiac rehab. Chyrl will complete cardiac rehab on 08/20/24   Expected Outcomes Chyrl will continue to participate in cardiac rehab for exercise, nutrition and lifestyle modifications Chyrl will continue to participate in cardiac rehab for exercise, nutrition and lifestyle modifications Chyrl will continue to participate in cardiac rehab for  exercise, nutrition and lifestyle modifications Chyrl will continue to participate in cardiac rehab for exercise, nutrition and lifestyle modifications Chyrl will continue to participate in cardiac rehab for exercise, nutrition and lifestyle modifications      Exercise Goals and Review:  Exercise Goals     Row Name 05/04/24 1124             Exercise Goals   Increase Physical Activity Yes       Intervention Provide advice, education, support and counseling about physical activity/exercise needs.;Develop an individualized exercise prescription for aerobic and resistive training based on initial evaluation findings, risk stratification, comorbidities and participant's personal goals.       Expected Outcomes Short Term: Attend rehab on a regular basis to increase amount of physical activity.;Long Term: Exercising regularly at least 3-5 days a week.;Long Term: Add in home exercise to make exercise part of routine and to increase amount of physical activity.       Increase Strength and Stamina Yes       Intervention Provide advice, education, support and counseling about physical activity/exercise needs.;Develop an individualized exercise prescription for aerobic and resistive training based on initial evaluation findings, risk stratification, comorbidities and participant's personal goals.       Expected Outcomes Short Term: Increase workloads from initial exercise prescription for resistance, speed, and METs.;Short Term: Perform resistance training exercises routinely during rehab and add in resistance training at home;Long Term: Improve cardiorespiratory fitness, muscular endurance and strength as measured by increased METs and functional capacity ( )       Able to understand and use rate of perceived exertion (RPE) scale Yes       Intervention Provide education and explanation on how to use RPE scale       Expected Outcomes Short Term: Able to use RPE daily in rehab to express subjective  intensity level;Long Term:  Able to use RPE to guide intensity level when exercising independently       Knowledge and understanding of Target Heart Rate Range (THRR) Yes       Intervention Provide education and explanation of THRR including how the numbers were predicted and where they are located for reference       Expected Outcomes Short Term: Able to state/look up THRR;Short Term: Able to use daily as guideline for intensity in rehab;Long Term: Able to use THRR to govern intensity when exercising independently       Understanding of Exercise Prescription Yes       Intervention Provide education, explanation, and written materials on patient's individual exercise prescription       Expected Outcomes Short Term: Able to explain program exercise prescription;Long Term: Able to explain home exercise prescription to exercise independently          Exercise Goals Re-Evaluation:  Exercise Goals Re-Evaluation     Row Name 05/07/24 1139 05/18/24 1101 06/01/24 1104 06/25/24 1049 07/06/24 1113     Exercise Goal Re-Evaluation   Exercise Goals Review Increase Physical Activity;Increase Strength and Stamina;Able to understand and use rate of perceived exertion (RPE) scale Increase Physical Activity;Increase Strength and Stamina;Able to understand and use rate of perceived exertion (RPE) scale;Knowledge and understanding of Target  Heart Rate Range (THRR) Increase Physical Activity;Increase Strength and Stamina;Able to understand and use rate of perceived exertion (RPE) scale;Knowledge and understanding of Target Heart Rate Range (THRR);Understanding of Exercise Prescription Increase Physical Activity;Increase Strength and Stamina;Able to understand and use rate of perceived exertion (RPE) scale;Knowledge and understanding of Target Heart Rate Range (THRR);Understanding of Exercise Prescription Increase Physical Activity;Increase Strength and Stamina;Able to understand and use rate of perceived exertion (RPE)  scale;Knowledge and understanding of Target Heart Rate Range (THRR);Understanding of Exercise Prescription   Comments Chyrl was able to understand and use RPE scale appropriately. Chyrl is walking 25 minutes twice/ day as his mode of home exercise. He is golfing and gardening without issue. Reviewed THRR with him. Reviewed exercise prescription with Chyrl. He's walking 20 minutes twice daily as his mode of home exercise. Chyrl reported today that his wife has COVID and he's symptomatic. No exercise today. Will review METs once cleared to return to exercise. Chyrl returned to exercise today after being out with COVID19. He is going a little slower today, and his goal is to get back to where he was with exercise prior to being out sick.   Expected Outcomes Progress workloads as tolerated to help increase cardiorespiratory fitness. Continue daily walking in addition to exercise at cardiac rehab. Continue daily walking to help achieve personal health and fitness goals. Review goals upon return to exercise. Progress workloads to rebuild strength and stamina.    Row Name 08/10/24 1116 08/15/24 1007 08/20/24 1134         Exercise Goal Re-Evaluation   Exercise Goals Review Increase Physical Activity;Increase Strength and Stamina;Able to understand and use rate of perceived exertion (RPE) scale;Knowledge and understanding of Target Heart Rate Range (THRR);Understanding of Exercise Prescription Increase Physical Activity;Increase Strength and Stamina;Able to understand and use rate of perceived exertion (RPE) scale;Knowledge and understanding of Target Heart Rate Range (THRR);Understanding of Exercise Prescription Increase Physical Activity;Increase Strength and Stamina;Able to understand and use rate of perceived exertion (RPE) scale;Knowledge and understanding of Target Heart Rate Range (THRR);Understanding of Exercise Prescription     Comments Chyrl will complete cardiac rehab in the next couple of weeks. He walks 15  minutes 2-3 times/day, walking his dog about 0.5 miles. He has exercise bands that he can use for his resistance exercise. We discussed him incorporating resistance exercise 1-3 sets, 10-15 repetitions, 2-3 non-consecutive days/week. Chyrl feels that he has improved since starting the program. He will continue walking and using resistance bands at home. He wants to work on his Core strength to help with his golf swing. Will provide some stretches he can do at home to help with Core strength. His functional capacity increased 31% as measured by and strength increased 6% as measured by grip strength test. Chyrl has lost 14.5 lbs since orientation. Chyrl completed the cardiac rehab program today and progressed well achieving 4.5 METs with exercise. He's pleased with his participation in the program and feels it's helped him.     Expected Outcomes Chyrl will continue daily walking 30-45 minutes, and resistance exercise 2-3 non-consecutive days. Chyrl will continue daily walking 30-45 minutes, and resistance exercise 2-3 non-consecutive days. Chyrl will continue daily walking 30-45 minutes, and resistance exercise 2-3 non-consecutive days.        Nutrition & Weight - Outcomes:  Pre Biometrics - 08/20/24 1041       Pre Biometrics   Height 6' (1.829 m)    Waist Circumference 42.5 inches    Hip Circumference 41.75 inches  Waist to Hip Ratio 1.02 %    BMI (Calculated) 29.33    Triceps Skinfold 24 mm    % Body Fat 31.2 %    Grip Strength 36 kg    Flexibility 13.75 in    Single Leg Stand 1.37 seconds          Post Biometrics - 08/15/24 1053        Post  Biometrics   Waist Circumference 42.5 inches    Hip Circumference 41.75 inches    Waist to Hip Ratio 1.02 %    Triceps Skinfold 24 mm    Grip Strength 36 kg    Flexibility 13.75 in    Single Leg Stand 1.37 seconds          Nutrition:  Nutrition Therapy & Goals - 07/11/24 1049       Nutrition Therapy   Diet heart healthy diet     Drug/Food Interactions Statins/Certain Fruits      Personal Nutrition Goals   Nutrition Goal Patient to identify strategies for reducing cardiovascular risk by attending the [facility] education and nutrition series weekly.   goal in action.   Personal Goal #2 Patient to improve diet quality by using the plate method as a guide for meal planning to include lean protein/plant protein, fruits, vegetables, whole grains, nonfat dairy as part of a well-balanced diet.   goal in action.   Comments Goals in action. Chyrl has medical history of coronary artery disease, NSTEMI, hyperlipidemia, pancytopenia, thoracic aortic aneurysm, compensated cirrhosis secondary to prior acute hepatitis A.He continues to attend the [facility] education and nutrition series regularly. LDL is not at goal; per cardiology documentation at 04/11/24 follow-up, Rhabdomyolysis on higher dose Crestor . Currently on Crestor  10 mg and Zetia  10 mg. Goal to reduce LDL to 55 or less due to multivessel coronary artery disease. Repatha /PCSK9 inhibitors started on 05/30/24. He continues regular follow-up with GI related to history of hepatitis A and cirrhosis. He is down 9# since starting with our program. Patient will benefit from participation in intensive cardiac rehab for nutrition education, exercise, and lifestyle modification.      Intervention Plan   Intervention Prescribe, educate and counsel regarding individualized specific dietary modifications aiming towards targeted core components such as weight, hypertension, lipid management, diabetes, heart failure and other comorbidities.;Nutrition handout(s) given to patient.    Expected Outcomes Short Term Goal: Understand basic principles of dietary content, such as calories, fat, sodium, cholesterol and nutrients.;Long Term Goal: Adherence to prescribed nutrition plan.          Nutrition Discharge:  Nutrition Assessments - 08/13/24 1744       Rate Your Plate Scores   Pre Score 56    Post  Score 67          Education Questionnaire Score:  Knowledge Questionnaire Score - 08/13/24 1738       Knowledge Questionnaire Score   Pre Score 23/24    Post Score 24/24          Goals reviewed with patient; copy given to patient.Pt graduates from  Intensive/Traditional cardiac rehab program on 08/20/24  with completion of  36 exercise and  34 education sessions. Pt maintained good attendance and progressed nicely during their participation in rehab as evidenced by increased MET level. Chyrl increased his distance on his post exercise walk test by 466 feet and lost 6.3 kg while enrolled in the program.  Medication list reconciled. Repeat  PHQ score- 2 .  Pt has made significant  lifestyle changes and should be commended for his success.  Chyrl achieved his goals during cardiac rehab.   Pt plans to continue exercise at home walking using resisitance bands and silver sneakers. We are proud of Jeff's progress and weight loss. Hadassah Elpidio Quan RN BSN

## 2024-08-28 ENCOUNTER — Other Ambulatory Visit: Payer: Self-pay

## 2024-08-28 DIAGNOSIS — K7460 Unspecified cirrhosis of liver: Secondary | ICD-10-CM

## 2024-09-04 ENCOUNTER — Ambulatory Visit (HOSPITAL_COMMUNITY)
Admission: RE | Admit: 2024-09-04 | Discharge: 2024-09-04 | Disposition: A | Discharge status: Home / Self Care | Source: Ambulatory Visit | Attending: Internal Medicine | Admitting: Internal Medicine

## 2024-09-04 DIAGNOSIS — K7689 Other specified diseases of liver: Secondary | ICD-10-CM | POA: Diagnosis not present

## 2024-09-04 DIAGNOSIS — Z9049 Acquired absence of other specified parts of digestive tract: Secondary | ICD-10-CM | POA: Diagnosis not present

## 2024-09-04 DIAGNOSIS — K7460 Unspecified cirrhosis of liver: Secondary | ICD-10-CM | POA: Insufficient documentation

## 2024-09-05 ENCOUNTER — Ambulatory Visit: Payer: Self-pay | Admitting: Internal Medicine

## 2024-09-24 ENCOUNTER — Encounter: Payer: Self-pay | Admitting: Family Medicine

## 2024-09-24 ENCOUNTER — Ambulatory Visit: Admitting: Family Medicine

## 2024-09-24 VITALS — BP 138/82 | HR 64 | Temp 98.0°F | Ht 72.0 in | Wt 215.0 lb

## 2024-09-24 DIAGNOSIS — K5792 Diverticulitis of intestine, part unspecified, without perforation or abscess without bleeding: Secondary | ICD-10-CM | POA: Diagnosis not present

## 2024-09-24 DIAGNOSIS — I214 Non-ST elevation (NSTEMI) myocardial infarction: Secondary | ICD-10-CM | POA: Diagnosis not present

## 2024-09-24 NOTE — Patient Instructions (Signed)
 Please return in march 2026 for cpe  I will release your lab results to you on your MyChart account with further instructions. You may see the results before I do, but when I review them I will send you a message with my report or have my assistant call you if things need to be discussed. Please reply to my message with any questions. Thank you!   If you have any questions or concerns, please don't hesitate to send me a message via MyChart or call the office at [phone]. Thank you for visiting with us  today! It's our pleasure caring for you.

## 2024-09-24 NOTE — Progress Notes (Signed)
 Subjective  CC:  Chief Complaint  Patient presents with   Abdominal Pain    Lower Lt abdominal pain since Friday morning. Pain is dual and achey      HPI: Timothy Grant is a 78 y.o. male who presents to the office today to address the problems listed above in the chief complaint. Discussed the use of AI scribe software for clinical note transcription with the patient, who gave verbal consent to proceed.  History of Present Illness Timothy Grant is a 78 year old male with diverticulosis and coronary artery disease who presents with abdominal pain.  Abdominal pain - Onset since last Friday - Pain characterized by tenderness, especially with deep inspiration - Pain does not improve with rest - Pain improves with ambulation - Sensation of tightness or 'stitches' when standing up after sitting - No changes in bowel movements - No diarrhea - No fever - No appetite loss - Consuming bland foods  History of diverticulosis - Diverticulosis identified on last colonoscopy    Coronary artery disease and cardiac symptoms - History of coronary artery disease - Experienced chest tightness earlier this year, leading to hospital visit with elevated cardiac enzymes - Underwent cardiac catheterization; no stenting performed due to blockage location near LAD - Currently on five cardiac medications, including Repatha  - Completed cardiac rehabilitation last month with 20-pound weight loss - No current chest pain or radiation of symptoms - I reviewed records     Assessment  1. Acute diverticulitis   2. NSTEMI (non-ST elevated myocardial infarction) [facility])      Plan  Assessment and Plan Assessment & Plan Diverticulitis of large intestine without perforation or abscess Tenderness in the left lower quadrant, consistent with mild diverticulitis. Symptoms include tenderness with deep breaths and tightness when standing. No fever, diarrhea, or changes in bowel movements.  Previous colonoscopy showed diverticula. Differential diagnosis considered, but symptoms and history suggest diverticulitis. - Prescribe Augmentin . - Order baseline blood work. - Advise bland diet, avoiding nuts and seeds. - If symptoms worsen, consider CT scan. - Recommend Tylenol  for mild pain management. - Instruct to report any changes in symptoms.   Coronary artery disease with recent non-ST elevation myocardial infarction Recent non-ST elevation myocardial infarction with a blockage in a minor vessel off the left ascending artery. Underwent cardiac rehab and lost 20 pounds. Currently on multiple medications including Repatha . Heart function appears fine.  Hyperlipidemia Currently managed with Repatha  as part of the treatment regimen for coronary artery disease.    Follow up: march for cpe Orders Placed This Encounter  Procedures   CBC with Differential/Platelet   Comprehensive metabolic panel with GFR   Sedimentation rate   No orders of the defined types were placed in this encounter.    I reviewed the patients updated PMH, FH, and SocHx.  Patient Active Problem List   Diagnosis Date Noted   NSTEMI (non-ST elevated myocardial infarction) (HCC) 03/25/2024    Priority: High   Hepatic cirrhosis (HCC) 02/09/2024    Priority: High   History of hepatitis A 05/15/2020    Priority: High   SIADH (syndrome of inappropriate ADH production) 05/15/2020    Priority: High   Acquired hypothyroidism 02/05/2011    Priority: High   Adenomatous colon polyp 02/05/2011    Priority: High   Mixed hyperlipidemia 02/05/2011    Priority: High   Mild aortic insufficiency 03/26/2024    Priority: Medium    Pancytopenia (HCC) 03/27/2019    Priority: Medium  Osteoarthritis of finger of left hand 01/07/2017    Priority: Medium    Leukopenia 08/09/2012    Priority: Medium    Thrombocytopenia 07/09/2019    Priority: Low   Male erectile disorder 02/09/2012    Priority: Low   Dilation of  aorta 03/26/2024   Elevated blood pressure reading without diagnosis of hypertension 03/26/2024   No outpatient medications have been marked as taking for the 09/24/24 encounter (Office Visit) with Jodie Lavern CROME, MD.   Allergies: Patient has no known allergies. Family History: Patient family history includes Alzheimer's disease in his mother; Arthritis in his mother; Breast cancer in his brother; Colon polyps in his brother; Dementia in his mother; Heart disease in his father and paternal grandmother; Hyperlipidemia in his brother; Lung cancer in his brother; Stroke in his brother and mother; Uterine cancer in his sister. Social History:  Patient  reports that he has quit smoking. His smoking use included cigarettes and cigars. He has never used smokeless tobacco. He reports that he does not currently use alcohol after a past usage of about 6.0 standard drinks of alcohol per week. He reports that he does not use drugs.  Review of Systems: Constitutional: Negative for fever malaise or anorexia Cardiovascular: negative for chest pain Respiratory: negative for SOB or persistent cough Gastrointestinal: negative for abdominal pain  Objective  Vitals: BP 138/82   Pulse 64   Temp 98 F (36.7 C)   Ht 6' (1.829 m)   Wt 215 lb (97.5 kg)   SpO2 96%   BMI 29.16 kg/m  General: no acute distress , A&Ox3 HEENT: PEERL, conjunctiva normal, neck is supple Cardiovascular:  RRR without murmur or gallop.  Respiratory:  Good breath sounds bilaterally, CTAB with normal respiratory effort Abdomen: soft, normal bowel sounds, LLQ ttp w/o rebound or mass Skin:  Warm, no rashes  No visits with results within 1 Day(s) from this visit.  Latest known visit with results is:  Appointment on 07/19/2024  Component Date Value Ref Range Status   WBC Count 07/19/2024 3.9 (L)  4.0 - 10.5 K/uL Final   RBC 07/19/2024 3.59 (L)  4.22 - 5.81 MIL/uL Final   Hemoglobin 07/19/2024 11.8 (L)  13.0 - 17.0 g/dL Final   HCT  91/85/7974 34.6 (L)  39.0 - 52.0 % Final   MCV 07/19/2024 96.4  80.0 - 100.0 fL Final   MCH 07/19/2024 32.9  26.0 - 34.0 pg Final   MCHC 07/19/2024 34.1  30.0 - 36.0 g/dL Final   RDW 91/85/7974 13.2  11.5 - 15.5 % Final   Platelet Count 07/19/2024 126 (L)  150 - 400 K/uL Final   nRBC 07/19/2024 0.0  0.0 - 0.2 % Final   Neutrophils Relative % 07/19/2024 63  % Final   Neutro Abs 07/19/2024 2.4  1.7 - 7.7 K/uL Final   Lymphocytes Relative 07/19/2024 22  % Final   Lymphs Abs 07/19/2024 0.9  0.7 - 4.0 K/uL Final   Monocytes Relative 07/19/2024 13  % Final   Monocytes Absolute 07/19/2024 0.5  0.1 - 1.0 K/uL Final   Eosinophils Relative 07/19/2024 2  % Final   Eosinophils Absolute 07/19/2024 0.1  0.0 - 0.5 K/uL Final   Basophils Relative 07/19/2024 0  % Final   Basophils Absolute 07/19/2024 0.0  0.0 - 0.1 K/uL Final   Immature Granulocytes 07/19/2024 0  % Final   Abs Immature Granulocytes 07/19/2024 0.01  0.00 - 0.07 K/uL Final   Sodium 07/19/2024 133 (L)  135 -  145 mmol/L Final   Potassium 07/19/2024 4.4  3.5 - 5.1 mmol/L Final   Chloride 07/19/2024 98  98 - 111 mmol/L Final   CO2 07/19/2024 26  22 - 32 mmol/L Final   Glucose, Bld 07/19/2024 90  70 - 99 mg/dL Final   BUN 91/85/7974 14  8 - 23 mg/dL Final   Creatinine 91/85/7974 0.99  0.61 - 1.24 mg/dL Final   Calcium  07/19/2024 9.6  8.9 - 10.3 mg/dL Final   Total Protein 91/85/7974 6.9  6.5 - 8.1 g/dL Final   Albumin 91/85/7974 4.3  3.5 - 5.0 g/dL Final   AST 91/85/7974 42 (H)  15 - 41 U/L Final   ALT 07/19/2024 35  0 - 44 U/L Final   Alkaline Phosphatase 07/19/2024 96  38 - 126 U/L Final   Total Bilirubin 07/19/2024 0.4  0.0 - 1.2 mg/dL Final   GFR, Estimated 07/19/2024 >60  >60 mL/min Final   Anion gap 07/19/2024 9  5 - 15 Final   LDH 07/19/2024 177  98 - 192 U/L Final    Commons side effects, risks, benefits, and alternatives for medications and treatment plan prescribed today were discussed, and the patient expressed understanding  of the given instructions. Patient is instructed to call or message via MyChart if he/she has any questions or concerns regarding our treatment plan. No barriers to understanding were identified. We discussed Red Flag symptoms and signs in detail. Patient expressed understanding regarding what to do in case of urgent or emergency type symptoms.  Medication list was reconciled, printed and provided to the patient in AVS. Patient instructions and summary information was reviewed with the patient as documented in the AVS. This note was prepared with assistance of Dragon voice recognition software. Occasional wrong-word or sound-a-like substitutions may have occurred due to the inherent limitations of voice recognition software

## 2024-09-25 ENCOUNTER — Telehealth: Payer: Self-pay

## 2024-09-25 MED ORDER — AMOXICILLIN-POT CLAVULANATE 875-125 MG PO TABS: 1.0000 | ORAL_TABLET | Freq: Two times a day (BID) | ORAL | 0 refills | Status: AC

## 2024-09-25 NOTE — Addendum Note (Signed)
 Addended by: JODIE GAMMONS on: 09/25/2024 11:36 AM   Modules accepted: Orders

## 2024-09-25 NOTE — Telephone Encounter (Signed)
 Copied from CRM #[id number]. Topic: Clinical - Medication Question >> Sep 24, 2024  5:30 PM Lauren C wrote: Reason for CRM: Pt states he had appt today with Dr. Jodie and she was going to send in augmentin  and amoxicillin  for diverticulitis. He called pharmacy and it was not sent in yet. He is requesting this to be sent to [facility] [id number] - [place] - [address] [place] [address] Phone: [phone] Fax: [phone]  Message was sent to PCP to Address

## 2024-11-10 ENCOUNTER — Other Ambulatory Visit: Payer: Self-pay | Admitting: Family Medicine

## 2024-11-10 DIAGNOSIS — E039 Hypothyroidism, unspecified: Secondary | ICD-10-CM

## 2024-11-22 ENCOUNTER — Other Ambulatory Visit: Payer: Self-pay | Admitting: Family Medicine

## 2024-11-27 ENCOUNTER — Other Ambulatory Visit: Payer: Self-pay

## 2024-11-27 MED ORDER — ROSUVASTATIN CALCIUM 10 MG PO TABS: 10.0000 mg | ORAL_TABLET | Freq: Every day | ORAL | 3 refills | Status: AC

## 2024-12-05 ENCOUNTER — Encounter: Payer: Self-pay | Admitting: Cardiology

## 2024-12-07 ENCOUNTER — Other Ambulatory Visit (HOSPITAL_COMMUNITY): Payer: Self-pay

## 2024-12-07 ENCOUNTER — Telehealth: Payer: Self-pay | Admitting: Pharmacy Technician

## 2024-12-07 NOTE — Telephone Encounter (Signed)
 Pharmacy Patient Advocate Encounter   Received notification from Physician's Office that prior authorization for repatha  is required/requested.   Insurance verification completed.   The patient is insured through [employer].   Per test claim: $401.12 - one month  Patient may be eligible for a Medicare prescription Payment plan. The patient will need to reach out to their insurance company to enrol in the payment plan to spread out their payments throughout the year, If available. This test claim was processed through [facility]- copay amounts may vary at other pharmacies due to pharmacy/plan contracts, or as the patient moves through the different stages of their insurance plan.

## 2024-12-11 ENCOUNTER — Other Ambulatory Visit (HOSPITAL_COMMUNITY): Payer: Self-pay

## 2024-12-11 ENCOUNTER — Telehealth: Payer: Self-pay | Admitting: Pharmacy Technician

## 2024-12-11 NOTE — Telephone Encounter (Signed)
 Patient Advocate Encounter   The patient was approved for a [employer] grant that will help cover the cost of REPATHA  Total amount awarded, 2500.  Effective: 11/11/24 - 11/10/25   APW:[id number] ERW:[id number] Hmnle:[id number] PI:[id number] [employer] ID: [id number]   Pharmacy provided with approval and processing information. Patient informed via mychart

## 2024-12-20 ENCOUNTER — Ambulatory Visit: Admitting: Hematology & Oncology

## 2024-12-20 ENCOUNTER — Inpatient Hospital Stay

## 2024-12-28 ENCOUNTER — Encounter: Payer: Self-pay | Admitting: Hematology & Oncology

## 2024-12-28 ENCOUNTER — Inpatient Hospital Stay: Admitting: Hematology & Oncology

## 2024-12-28 ENCOUNTER — Inpatient Hospital Stay: Attending: Hematology & Oncology

## 2024-12-28 VITALS — BP 128/72 | HR 68 | Temp 98.3°F | Resp 20 | Ht 73.0 in | Wt 218.4 lb

## 2024-12-28 DIAGNOSIS — D61818 Other pancytopenia: Secondary | ICD-10-CM | POA: Diagnosis not present

## 2024-12-28 DIAGNOSIS — D696 Thrombocytopenia, unspecified: Secondary | ICD-10-CM | POA: Diagnosis not present

## 2024-12-28 LAB — CMP (CANCER CENTER ONLY)
ALT: 36 U/L (ref 0–44)
AST: 38 U/L (ref 15–41)
Albumin: 4.3 g/dL (ref 3.5–5.0)
Alkaline Phosphatase: 85 U/L (ref 38–126)
Anion gap: 8 (ref 5–15)
BUN: 16 mg/dL (ref 8–23)
CO2: 27 mmol/L (ref 22–32)
Calcium: 9.7 mg/dL (ref 8.9–10.3)
Chloride: 99 mmol/L (ref 98–111)
Creatinine: 1.01 mg/dL (ref 0.61–1.24)
GFR, Estimated: >60 mL/min
Glucose, Bld: 99 mg/dL (ref 70–99)
Potassium: 4.9 mmol/L (ref 3.5–5.1)
Sodium: 134 mmol/L — ABNORMAL LOW (ref 135–145)
Total Bilirubin: 0.5 mg/dL (ref 0.0–1.2)
Total Protein: 6.8 g/dL (ref 6.5–8.1)

## 2024-12-28 LAB — CBC WITH DIFFERENTIAL (CANCER CENTER ONLY)
Abs Immature Granulocytes: 0.01 K/uL (ref 0.00–0.07)
Basophils Absolute: 0 K/uL (ref 0.0–0.1)
Basophils Relative: 1 %
Eosinophils Absolute: 0.1 K/uL (ref 0.0–0.5)
Eosinophils Relative: 3 %
HCT: 35.3 % — ABNORMAL LOW (ref 39.0–52.0)
Hemoglobin: 12.1 g/dL — ABNORMAL LOW (ref 13.0–17.0)
Immature Granulocytes: 0 %
Lymphocytes Relative: 23 %
Lymphs Abs: 0.8 K/uL (ref 0.7–4.0)
MCH: 32.7 pg (ref 26.0–34.0)
MCHC: 34.3 g/dL (ref 30.0–36.0)
MCV: 95.4 fL (ref 80.0–100.0)
Monocytes Absolute: 0.4 K/uL (ref 0.1–1.0)
Monocytes Relative: 11 %
Neutro Abs: 2.3 K/uL (ref 1.7–7.7)
Neutrophils Relative %: 62 %
Platelet Count: 118 K/uL — ABNORMAL LOW (ref 150–400)
RBC: 3.7 MIL/uL — ABNORMAL LOW (ref 4.22–5.81)
RDW: 13.5 % (ref 11.5–15.5)
WBC Count: 3.6 K/uL — ABNORMAL LOW (ref 4.0–10.5)
nRBC: 0 % (ref 0.0–0.2)

## 2024-12-28 LAB — SAVE SMEAR(SSMR), FOR PROVIDER SLIDE REVIEW

## 2024-12-28 NOTE — Progress Notes (Signed)
 " Hematology and Oncology Follow Up Visit  Timothy Grant [id number] [birth year] 79 y.o. 12/28/2024   Principle Diagnosis:  Chronic thrombocytopenia-likely cirrhosis/splenomegaly   Current Therapy:        Observation   Interim History:  Timothy Grant is here today for follow-up.  We last saw him back in August.  Since then, has been doing pretty well.  He has had no complaints.  He has had no problems with bleeding or bruising.  He has had no problems with cough or shortness of breath.  He has had no issues with COVID or Influenza.  There is been no leg swelling.  He has had no rashes.  He has heart attack back in April.  I would think that he will be off Plavix  come this April.  Currently, I will say that his performance status is probably ECOG 1.   Medications:  Allergies as of 12/28/2024   No Known Allergies      Medication List        Accurate as of December 28, 2024  2:54 PM. If you have any questions, ask your nurse or doctor.          acetaminophen  500 MG tablet Commonly known as: TYLENOL  Take 1,000 mg by mouth daily as needed for headache.   amLODipine  2.5 MG tablet Commonly known as: NORVASC  TAKE 1 TABLET BY MOUTH DAILY   aspirin  EC 81 MG tablet Take 1 tablet (81 mg total) by mouth daily. Swallow whole.   Cetirizine HCl 10 MG Caps Take 10 mg by mouth daily.   clopidogrel  75 MG tablet Commonly known as: PLAVIX  Take 1 tablet (75 mg total) by mouth daily.   cyanocobalamin  1000 MCG tablet Commonly known as: VITAMIN B12 Take 1,000 mcg by mouth daily.   cyclobenzaprine  10 MG tablet Commonly known as: FLEXERIL  Take 1 tablet (10 mg total) by mouth 3 (three) times daily as needed for muscle spasms.   ezetimibe  10 MG tablet Commonly known as: ZETIA  TAKE 1 TABLET BY MOUTH DAILY   Fish Oil 1200 MG Caps Take 1,200 mg by mouth daily.   folic acid  1 MG tablet Commonly known as: FOLVITE  TAKE 1 TABLET BY MOUTH DAILY   Glucosamine HCl 1000 MG Tabs Take  1 tablet by mouth daily.   isosorbide  mononitrate 30 MG 24 hr tablet Commonly known as: IMDUR  TAKE 1 TABLET BY MOUTH DAILY   levothyroxine  137 MCG tablet Commonly known as: SYNTHROID  Take 1 tablet by mouth daily on Monday, Wednesday, Friday.   levothyroxine  150 MCG tablet Commonly known as: SYNTHROID  TAKE 1 TABLET BY MOUTH ON TUESDAY, THURSDAY, SATURDAY, AND SUNDAY   multivitamin capsule Take 1 capsule by mouth daily. Centrum Senior   nitroGLYCERIN  0.4 MG SL tablet Commonly known as: NITROSTAT  Place 1 tablet (0.4 mg total) under the tongue every 5 (five) minutes as needed for chest pain (up to 3 doses).   Repatha  SureClick 140 MG/ML Soaj Generic drug: Evolocumab  Inject 140 mg into the skin every 14 (fourteen) days.   rosuvastatin  10 MG tablet Commonly known as: CRESTOR  Take 1 tablet (10 mg total) by mouth daily.   Vitamin D3 125 MCG (5000 UT) Caps Take 5,000 Units by mouth daily.        Allergies: No Known Allergies  Past Medical History, Surgical history, Social history, and Family History were reviewed and updated.  Review of Systems:  Review of Systems  Constitutional: Negative.   HENT: Negative.    Eyes: Negative.   Respiratory: Negative.  Cardiovascular: Negative.   Gastrointestinal: Negative.   Genitourinary: Negative.   Musculoskeletal: Negative.   Skin: Negative.   Neurological: Negative.   Endo/Heme/Allergies: Negative.   Psychiatric/Behavioral: Negative.       Physical Exam:   Vital signs show temperature of 98.3.  Pulse 68.  Blood pressure 128/72.  Weight is 218 pounds.  Wt Readings from Last 3 Encounters:  09/24/24 215 lb (97.5 kg)  08/20/24 216 lb 4.3 oz (98.1 kg)  07/19/24 219 lb 12.8 oz (99.7 kg)    Physical Exam Vitals reviewed.  HENT:     Head: Normocephalic and atraumatic.  Eyes:     Pupils: Pupils are equal, round, and reactive to light.  Cardiovascular:     Rate and Rhythm: Normal rate and regular rhythm.     Heart  sounds: Normal heart sounds.  Pulmonary:     Effort: Pulmonary effort is normal.     Breath sounds: Normal breath sounds.  Abdominal:     General: Bowel sounds are normal.     Palpations: Abdomen is soft.  Musculoskeletal:        General: No tenderness or deformity. Normal range of motion.     Cervical back: Normal range of motion.  Lymphadenopathy:     Cervical: No cervical adenopathy.  Skin:    General: Skin is warm and dry.     Findings: No erythema or rash.  Neurological:     Mental Status: He is alert and oriented to person, place, and time.  Psychiatric:        Behavior: Behavior normal.        Thought Content: Thought content normal.        Judgment: Judgment normal.     Lab Results  Component Value Date   WBC 3.9 (L) 07/19/2024   HGB 11.8 (L) 07/19/2024   HCT 34.6 (L) 07/19/2024   MCV 96.4 07/19/2024   PLT 126 (L) 07/19/2024   Lab Results  Component Value Date   FERRITIN 52 03/16/2024   IRON 96 03/16/2024   TIBC 374 03/16/2024   UIBC 278 03/16/2024   IRONPCTSAT 26 03/16/2024   Lab Results  Component Value Date   RBC 3.59 (L) 07/19/2024   No results found for: KPAFRELGTCHN, LAMBDASER, KAPLAMBRATIO Lab Results  Component Value Date   IGGSERUM 981 10/04/2023   No results found for: STEPHANY RINGS, A1GS, A2GS, EARLA JOANNIE KNIGHTS, MSPIKE, SPEI   Chemistry      Component Value Date/Time   NA 133 (L) 07/19/2024 1517   K 4.4 07/19/2024 1517   CL 98 07/19/2024 1517   CO2 26 07/19/2024 1517   BUN 14 07/19/2024 1517   CREATININE 0.99 07/19/2024 1517   CREATININE 1.02 11/17/2020 0848      Component Value Date/Time   CALCIUM  9.6 07/19/2024 1517   ALKPHOS 96 07/19/2024 1517   AST 42 (H) 07/19/2024 1517   ALT 35 07/19/2024 1517   BILITOT 0.4 07/19/2024 1517      Impression and Plan: Timothy Grant is a very nice 79 yo caucasian gentleman with very mild thrombocytopenia and neutropenia.   Again, from my point of view,  I think his blood counts are holding pretty steady.  He is totally asymptomatic.  I think we can probably get him back in another 4 or 5 months.   Maude JONELLE Crease, MD 1/23/20262:54 PM  "

## 2025-01-06 ENCOUNTER — Other Ambulatory Visit: Payer: Self-pay | Admitting: Physician Assistant

## 2025-01-09 NOTE — Telephone Encounter (Signed)
 Lipids Completed on 07/20/24. Labs on 12/28/24 Outside of Normal Range  In accordance with refill protocols, please review and address the following requirements before this medication refill can be authorized:  Labs

## 2025-01-11 ENCOUNTER — Other Ambulatory Visit: Payer: Self-pay

## 2025-01-11 MED ORDER — CLOPIDOGREL BISULFATE 75 MG PO TABS: 75.0000 mg | ORAL_TABLET | Freq: Every day | ORAL | 11 refills | Status: AC

## 2025-01-18 ENCOUNTER — Ambulatory Visit: Admitting: Cardiology

## 2025-02-11 ENCOUNTER — Encounter: Admitting: Family Medicine

## 2025-04-26 ENCOUNTER — Inpatient Hospital Stay: Admitting: Hematology & Oncology

## 2025-04-26 ENCOUNTER — Inpatient Hospital Stay

## 2025-05-27 ENCOUNTER — Ambulatory Visit
